# Patient Record
Sex: Female | Born: 1945 | Race: Black or African American | Hispanic: No | Marital: Married | Smoking: Never smoker
Health system: Southern US, Community
[De-identification: ages and names within clinical notes are randomized; demographics above are authoritative.]

## PROBLEM LIST (undated history)

## (undated) DIAGNOSIS — I10 Essential (primary) hypertension: Secondary | ICD-10-CM

## (undated) DIAGNOSIS — E079 Disorder of thyroid, unspecified: Secondary | ICD-10-CM

## (undated) DIAGNOSIS — N189 Chronic kidney disease, unspecified: Secondary | ICD-10-CM

## (undated) DIAGNOSIS — I509 Heart failure, unspecified: Secondary | ICD-10-CM

## (undated) DIAGNOSIS — D869 Sarcoidosis, unspecified: Secondary | ICD-10-CM

## (undated) DIAGNOSIS — J449 Chronic obstructive pulmonary disease, unspecified: Secondary | ICD-10-CM

## (undated) HISTORY — DX: Chronic obstructive pulmonary disease, unspecified: J44.9

## (undated) HISTORY — PX: KNEE SURGERY: SHX244

## (undated) HISTORY — PX: OTHER SURGICAL HISTORY: SHX169

## (undated) HISTORY — PX: CATARACT EXTRACTION: SUR2

## (undated) HISTORY — PX: GALLBLADDER SURGERY: SHX652

## (undated) HISTORY — DX: Chronic kidney disease, unspecified: N18.9

## (undated) HISTORY — DX: Sarcoidosis, unspecified: D86.9

## (undated) HISTORY — DX: Heart failure, unspecified: I50.9

---

## 2003-01-28 ENCOUNTER — Ambulatory Visit (HOSPITAL_COMMUNITY): Admission: RE | Admit: 2003-01-28 | Discharge: 2003-01-28 | Payer: Self-pay

## 2004-02-05 ENCOUNTER — Ambulatory Visit: Payer: Self-pay | Admitting: Unknown Physician Specialty

## 2005-10-18 ENCOUNTER — Ambulatory Visit: Payer: Self-pay | Admitting: Family Medicine

## 2006-03-28 ENCOUNTER — Ambulatory Visit: Payer: Self-pay | Admitting: Unknown Physician Specialty

## 2006-04-27 ENCOUNTER — Ambulatory Visit: Payer: Self-pay | Admitting: Urology

## 2006-05-17 ENCOUNTER — Other Ambulatory Visit: Payer: Self-pay

## 2006-05-17 ENCOUNTER — Ambulatory Visit: Payer: Self-pay | Admitting: Urology

## 2006-05-28 ENCOUNTER — Ambulatory Visit: Payer: Self-pay

## 2007-04-30 ENCOUNTER — Encounter: Payer: Self-pay | Admitting: Orthopedic Surgery

## 2007-05-16 ENCOUNTER — Ambulatory Visit: Payer: Self-pay | Admitting: Unknown Physician Specialty

## 2007-05-19 ENCOUNTER — Encounter: Payer: Self-pay | Admitting: Orthopedic Surgery

## 2007-06-19 ENCOUNTER — Encounter: Payer: Self-pay | Admitting: Orthopedic Surgery

## 2007-07-19 ENCOUNTER — Encounter: Payer: Self-pay | Admitting: Orthopedic Surgery

## 2008-01-13 ENCOUNTER — Ambulatory Visit: Payer: Self-pay | Admitting: Unknown Physician Specialty

## 2008-01-29 ENCOUNTER — Ambulatory Visit: Payer: Self-pay | Admitting: Otolaryngology

## 2008-05-05 ENCOUNTER — Ambulatory Visit: Payer: Self-pay | Admitting: Otolaryngology

## 2008-05-21 ENCOUNTER — Ambulatory Visit: Payer: Self-pay | Admitting: Unknown Physician Specialty

## 2009-01-18 ENCOUNTER — Emergency Department: Payer: Self-pay | Admitting: Emergency Medicine

## 2009-05-25 ENCOUNTER — Ambulatory Visit: Payer: Self-pay | Admitting: Unknown Physician Specialty

## 2009-06-21 ENCOUNTER — Encounter: Payer: Self-pay | Admitting: Otolaryngology

## 2009-07-18 ENCOUNTER — Encounter: Payer: Self-pay | Admitting: Otolaryngology

## 2009-08-18 ENCOUNTER — Encounter: Payer: Self-pay | Admitting: Otolaryngology

## 2009-09-17 ENCOUNTER — Encounter: Payer: Self-pay | Admitting: Otolaryngology

## 2009-10-19 ENCOUNTER — Emergency Department: Payer: Self-pay | Admitting: Emergency Medicine

## 2010-06-28 ENCOUNTER — Ambulatory Visit: Payer: Self-pay | Admitting: Unknown Physician Specialty

## 2010-07-27 ENCOUNTER — Emergency Department: Payer: Self-pay | Admitting: Emergency Medicine

## 2010-09-04 ENCOUNTER — Emergency Department: Payer: Self-pay | Admitting: Emergency Medicine

## 2011-01-23 ENCOUNTER — Ambulatory Visit: Payer: Self-pay

## 2011-01-26 ENCOUNTER — Ambulatory Visit: Payer: Self-pay

## 2011-04-06 ENCOUNTER — Ambulatory Visit: Payer: Self-pay | Admitting: Gastroenterology

## 2011-04-07 LAB — PATHOLOGY REPORT

## 2011-05-26 ENCOUNTER — Ambulatory Visit: Payer: Self-pay

## 2011-05-31 DIAGNOSIS — G8929 Other chronic pain: Secondary | ICD-10-CM | POA: Insufficient documentation

## 2011-05-31 DIAGNOSIS — D869 Sarcoidosis, unspecified: Secondary | ICD-10-CM | POA: Insufficient documentation

## 2011-05-31 DIAGNOSIS — D219 Benign neoplasm of connective and other soft tissue, unspecified: Secondary | ICD-10-CM | POA: Insufficient documentation

## 2011-05-31 DIAGNOSIS — R102 Pelvic and perineal pain: Secondary | ICD-10-CM | POA: Insufficient documentation

## 2011-07-13 ENCOUNTER — Ambulatory Visit: Payer: Self-pay | Admitting: Unknown Physician Specialty

## 2012-06-11 ENCOUNTER — Ambulatory Visit: Payer: Self-pay

## 2012-10-04 ENCOUNTER — Ambulatory Visit: Payer: Self-pay

## 2013-03-24 DIAGNOSIS — J019 Acute sinusitis, unspecified: Secondary | ICD-10-CM | POA: Diagnosis not present

## 2013-04-08 DIAGNOSIS — D259 Leiomyoma of uterus, unspecified: Secondary | ICD-10-CM | POA: Diagnosis not present

## 2013-04-08 DIAGNOSIS — D251 Intramural leiomyoma of uterus: Secondary | ICD-10-CM | POA: Diagnosis not present

## 2013-05-12 DIAGNOSIS — H4011X0 Primary open-angle glaucoma, stage unspecified: Secondary | ICD-10-CM | POA: Diagnosis not present

## 2013-06-02 DIAGNOSIS — M545 Low back pain, unspecified: Secondary | ICD-10-CM | POA: Diagnosis not present

## 2013-06-02 DIAGNOSIS — E038 Other specified hypothyroidism: Secondary | ICD-10-CM | POA: Diagnosis not present

## 2013-06-02 DIAGNOSIS — R011 Cardiac murmur, unspecified: Secondary | ICD-10-CM | POA: Diagnosis not present

## 2013-06-02 DIAGNOSIS — M5430 Sciatica, unspecified side: Secondary | ICD-10-CM | POA: Diagnosis not present

## 2013-06-02 DIAGNOSIS — R5381 Other malaise: Secondary | ICD-10-CM | POA: Diagnosis not present

## 2013-06-02 DIAGNOSIS — R5383 Other fatigue: Secondary | ICD-10-CM | POA: Diagnosis not present

## 2013-06-02 DIAGNOSIS — I10 Essential (primary) hypertension: Secondary | ICD-10-CM | POA: Diagnosis not present

## 2013-06-04 ENCOUNTER — Ambulatory Visit: Payer: Self-pay

## 2013-06-04 DIAGNOSIS — M47812 Spondylosis without myelopathy or radiculopathy, cervical region: Secondary | ICD-10-CM | POA: Diagnosis not present

## 2013-06-04 DIAGNOSIS — M546 Pain in thoracic spine: Secondary | ICD-10-CM | POA: Diagnosis not present

## 2013-06-04 DIAGNOSIS — M47817 Spondylosis without myelopathy or radiculopathy, lumbosacral region: Secondary | ICD-10-CM | POA: Diagnosis not present

## 2013-06-04 DIAGNOSIS — M5030 Other cervical disc degeneration, unspecified cervical region: Secondary | ICD-10-CM | POA: Diagnosis not present

## 2013-06-09 DIAGNOSIS — E039 Hypothyroidism, unspecified: Secondary | ICD-10-CM | POA: Diagnosis not present

## 2013-06-09 DIAGNOSIS — E213 Hyperparathyroidism, unspecified: Secondary | ICD-10-CM | POA: Diagnosis not present

## 2013-06-16 DIAGNOSIS — E213 Hyperparathyroidism, unspecified: Secondary | ICD-10-CM | POA: Diagnosis not present

## 2013-06-16 DIAGNOSIS — M810 Age-related osteoporosis without current pathological fracture: Secondary | ICD-10-CM | POA: Diagnosis not present

## 2013-06-16 DIAGNOSIS — E039 Hypothyroidism, unspecified: Secondary | ICD-10-CM | POA: Diagnosis not present

## 2013-06-24 DIAGNOSIS — E038 Other specified hypothyroidism: Secondary | ICD-10-CM | POA: Diagnosis not present

## 2013-06-24 DIAGNOSIS — M5430 Sciatica, unspecified side: Secondary | ICD-10-CM | POA: Diagnosis not present

## 2013-06-24 DIAGNOSIS — I10 Essential (primary) hypertension: Secondary | ICD-10-CM | POA: Diagnosis not present

## 2013-06-24 DIAGNOSIS — M064 Inflammatory polyarthropathy: Secondary | ICD-10-CM | POA: Diagnosis not present

## 2013-06-24 DIAGNOSIS — M545 Low back pain, unspecified: Secondary | ICD-10-CM | POA: Diagnosis not present

## 2013-06-24 DIAGNOSIS — R5381 Other malaise: Secondary | ICD-10-CM | POA: Diagnosis not present

## 2013-06-24 DIAGNOSIS — R5383 Other fatigue: Secondary | ICD-10-CM | POA: Diagnosis not present

## 2013-07-04 DIAGNOSIS — S058X9A Other injuries of unspecified eye and orbit, initial encounter: Secondary | ICD-10-CM | POA: Diagnosis not present

## 2013-07-07 DIAGNOSIS — H40019 Open angle with borderline findings, low risk, unspecified eye: Secondary | ICD-10-CM | POA: Diagnosis not present

## 2013-07-07 DIAGNOSIS — H16149 Punctate keratitis, unspecified eye: Secondary | ICD-10-CM | POA: Diagnosis not present

## 2013-07-08 DIAGNOSIS — I10 Essential (primary) hypertension: Secondary | ICD-10-CM | POA: Diagnosis not present

## 2013-07-08 DIAGNOSIS — J019 Acute sinusitis, unspecified: Secondary | ICD-10-CM | POA: Diagnosis not present

## 2013-09-16 DIAGNOSIS — S058X9A Other injuries of unspecified eye and orbit, initial encounter: Secondary | ICD-10-CM | POA: Diagnosis not present

## 2013-09-16 DIAGNOSIS — R5383 Other fatigue: Secondary | ICD-10-CM | POA: Diagnosis not present

## 2013-09-16 DIAGNOSIS — E039 Hypothyroidism, unspecified: Secondary | ICD-10-CM | POA: Diagnosis not present

## 2013-09-16 DIAGNOSIS — M064 Inflammatory polyarthropathy: Secondary | ICD-10-CM | POA: Diagnosis not present

## 2013-09-16 DIAGNOSIS — J019 Acute sinusitis, unspecified: Secondary | ICD-10-CM | POA: Diagnosis not present

## 2013-09-16 DIAGNOSIS — R5381 Other malaise: Secondary | ICD-10-CM | POA: Diagnosis not present

## 2013-09-16 DIAGNOSIS — I10 Essential (primary) hypertension: Secondary | ICD-10-CM | POA: Diagnosis not present

## 2013-09-16 DIAGNOSIS — J309 Allergic rhinitis, unspecified: Secondary | ICD-10-CM | POA: Diagnosis not present

## 2013-09-16 DIAGNOSIS — D869 Sarcoidosis, unspecified: Secondary | ICD-10-CM | POA: Diagnosis not present

## 2013-09-30 DIAGNOSIS — J019 Acute sinusitis, unspecified: Secondary | ICD-10-CM | POA: Diagnosis not present

## 2013-09-30 DIAGNOSIS — M064 Inflammatory polyarthropathy: Secondary | ICD-10-CM | POA: Diagnosis not present

## 2013-09-30 DIAGNOSIS — R5383 Other fatigue: Secondary | ICD-10-CM | POA: Diagnosis not present

## 2013-09-30 DIAGNOSIS — R5381 Other malaise: Secondary | ICD-10-CM | POA: Diagnosis not present

## 2013-09-30 DIAGNOSIS — H189 Unspecified disorder of cornea: Secondary | ICD-10-CM | POA: Diagnosis not present

## 2013-09-30 DIAGNOSIS — J309 Allergic rhinitis, unspecified: Secondary | ICD-10-CM | POA: Diagnosis not present

## 2013-09-30 DIAGNOSIS — I10 Essential (primary) hypertension: Secondary | ICD-10-CM | POA: Diagnosis not present

## 2013-10-01 DIAGNOSIS — H4011X0 Primary open-angle glaucoma, stage unspecified: Secondary | ICD-10-CM | POA: Diagnosis not present

## 2013-10-22 DIAGNOSIS — S93609A Unspecified sprain of unspecified foot, initial encounter: Secondary | ICD-10-CM | POA: Diagnosis not present

## 2013-10-22 DIAGNOSIS — M79609 Pain in unspecified limb: Secondary | ICD-10-CM | POA: Diagnosis not present

## 2013-10-22 DIAGNOSIS — M545 Low back pain, unspecified: Secondary | ICD-10-CM | POA: Diagnosis not present

## 2013-10-22 DIAGNOSIS — M7730 Calcaneal spur, unspecified foot: Secondary | ICD-10-CM | POA: Diagnosis not present

## 2013-10-22 DIAGNOSIS — IMO0001 Reserved for inherently not codable concepts without codable children: Secondary | ICD-10-CM | POA: Diagnosis not present

## 2013-10-22 DIAGNOSIS — I10 Essential (primary) hypertension: Secondary | ICD-10-CM | POA: Diagnosis not present

## 2013-10-22 DIAGNOSIS — Z79899 Other long term (current) drug therapy: Secondary | ICD-10-CM | POA: Diagnosis not present

## 2013-10-22 DIAGNOSIS — M19079 Primary osteoarthritis, unspecified ankle and foot: Secondary | ICD-10-CM | POA: Diagnosis not present

## 2013-11-07 DIAGNOSIS — S058X9A Other injuries of unspecified eye and orbit, initial encounter: Secondary | ICD-10-CM | POA: Diagnosis not present

## 2013-11-10 DIAGNOSIS — S058X9A Other injuries of unspecified eye and orbit, initial encounter: Secondary | ICD-10-CM | POA: Diagnosis not present

## 2013-11-13 ENCOUNTER — Emergency Department: Payer: Self-pay | Admitting: Emergency Medicine

## 2013-11-13 DIAGNOSIS — S93609A Unspecified sprain of unspecified foot, initial encounter: Secondary | ICD-10-CM | POA: Diagnosis not present

## 2013-11-13 DIAGNOSIS — I10 Essential (primary) hypertension: Secondary | ICD-10-CM | POA: Diagnosis not present

## 2013-11-13 DIAGNOSIS — M19079 Primary osteoarthritis, unspecified ankle and foot: Secondary | ICD-10-CM | POA: Diagnosis not present

## 2013-11-13 DIAGNOSIS — M7730 Calcaneal spur, unspecified foot: Secondary | ICD-10-CM | POA: Diagnosis not present

## 2013-11-13 LAB — BASIC METABOLIC PANEL
Anion Gap: 7 (ref 7–16)
BUN: 22 mg/dL — ABNORMAL HIGH (ref 7–18)
Calcium, Total: 10.2 mg/dL — ABNORMAL HIGH (ref 8.5–10.1)
Chloride: 109 mmol/L — ABNORMAL HIGH (ref 98–107)
Co2: 26 mmol/L (ref 21–32)
Creatinine: 1.11 mg/dL (ref 0.60–1.30)
EGFR (African American): >60
EGFR (Non-African Amer.): 52 — ABNORMAL LOW
Glucose: 92 mg/dL (ref 65–99)
Osmolality: 286 (ref 275–301)
Potassium: 4 mmol/L (ref 3.5–5.1)
Sodium: 142 mmol/L (ref 136–145)

## 2013-11-13 LAB — CBC WITH DIFFERENTIAL/PLATELET
Basophil #: 0.1 10*3/uL (ref 0.0–0.1)
Basophil %: 0.8 %
Eosinophil #: 0.1 10*3/uL (ref 0.0–0.7)
Eosinophil %: 0.9 %
HCT: 39.9 % (ref 35.0–47.0)
HGB: 12.7 g/dL (ref 12.0–16.0)
Lymphocyte #: 1.7 10*3/uL (ref 1.0–3.6)
Lymphocyte %: 20.7 %
MCH: 27.5 pg (ref 26.0–34.0)
MCHC: 31.8 g/dL — ABNORMAL LOW (ref 32.0–36.0)
MCV: 86 fL (ref 80–100)
Monocyte #: 0.9 x10 3/mm (ref 0.2–0.9)
Monocyte %: 11.8 %
Neutrophil #: 5.3 10*3/uL (ref 1.4–6.5)
Neutrophil %: 65.8 %
Platelet: 169 10*3/uL (ref 150–440)
RBC: 4.62 10*6/uL (ref 3.80–5.20)
RDW: 14.9 % — ABNORMAL HIGH (ref 11.5–14.5)
WBC: 8 10*3/uL (ref 3.6–11.0)

## 2013-11-13 LAB — URIC ACID: Uric Acid: 4.4 mg/dL (ref 2.6–6.0)

## 2013-11-18 DIAGNOSIS — M545 Low back pain, unspecified: Secondary | ICD-10-CM | POA: Diagnosis not present

## 2013-11-18 DIAGNOSIS — IMO0001 Reserved for inherently not codable concepts without codable children: Secondary | ICD-10-CM | POA: Diagnosis not present

## 2013-12-10 DIAGNOSIS — I10 Essential (primary) hypertension: Secondary | ICD-10-CM | POA: Diagnosis not present

## 2013-12-10 DIAGNOSIS — N183 Chronic kidney disease, stage 3 unspecified: Secondary | ICD-10-CM | POA: Diagnosis not present

## 2013-12-12 DIAGNOSIS — E039 Hypothyroidism, unspecified: Secondary | ICD-10-CM | POA: Diagnosis not present

## 2013-12-12 DIAGNOSIS — E559 Vitamin D deficiency, unspecified: Secondary | ICD-10-CM | POA: Diagnosis not present

## 2013-12-19 DIAGNOSIS — I10 Essential (primary) hypertension: Secondary | ICD-10-CM | POA: Diagnosis not present

## 2013-12-19 DIAGNOSIS — E213 Hyperparathyroidism, unspecified: Secondary | ICD-10-CM | POA: Diagnosis not present

## 2013-12-19 DIAGNOSIS — M810 Age-related osteoporosis without current pathological fracture: Secondary | ICD-10-CM | POA: Diagnosis not present

## 2013-12-19 DIAGNOSIS — E784 Other hyperlipidemia: Secondary | ICD-10-CM | POA: Diagnosis not present

## 2013-12-19 DIAGNOSIS — I517 Cardiomegaly: Secondary | ICD-10-CM | POA: Diagnosis not present

## 2013-12-19 DIAGNOSIS — R001 Bradycardia, unspecified: Secondary | ICD-10-CM | POA: Diagnosis not present

## 2013-12-19 DIAGNOSIS — E039 Hypothyroidism, unspecified: Secondary | ICD-10-CM | POA: Diagnosis not present

## 2014-01-06 DIAGNOSIS — H40023 Open angle with borderline findings, high risk, bilateral: Secondary | ICD-10-CM | POA: Diagnosis not present

## 2014-01-08 DIAGNOSIS — R001 Bradycardia, unspecified: Secondary | ICD-10-CM | POA: Diagnosis not present

## 2014-01-08 DIAGNOSIS — E210 Primary hyperparathyroidism: Secondary | ICD-10-CM | POA: Diagnosis not present

## 2014-01-08 DIAGNOSIS — I517 Cardiomegaly: Secondary | ICD-10-CM | POA: Diagnosis not present

## 2014-01-08 DIAGNOSIS — E039 Hypothyroidism, unspecified: Secondary | ICD-10-CM | POA: Diagnosis not present

## 2014-01-08 DIAGNOSIS — M810 Age-related osteoporosis without current pathological fracture: Secondary | ICD-10-CM | POA: Diagnosis not present

## 2014-01-08 DIAGNOSIS — I10 Essential (primary) hypertension: Secondary | ICD-10-CM | POA: Diagnosis not present

## 2014-01-08 DIAGNOSIS — E782 Mixed hyperlipidemia: Secondary | ICD-10-CM | POA: Insufficient documentation

## 2014-01-29 DIAGNOSIS — J300 Vasomotor rhinitis: Secondary | ICD-10-CM | POA: Diagnosis not present

## 2014-01-29 DIAGNOSIS — I10 Essential (primary) hypertension: Secondary | ICD-10-CM | POA: Diagnosis not present

## 2014-01-29 DIAGNOSIS — E039 Hypothyroidism, unspecified: Secondary | ICD-10-CM | POA: Diagnosis not present

## 2014-01-29 DIAGNOSIS — D869 Sarcoidosis, unspecified: Secondary | ICD-10-CM | POA: Diagnosis not present

## 2014-01-29 DIAGNOSIS — E782 Mixed hyperlipidemia: Secondary | ICD-10-CM | POA: Diagnosis not present

## 2014-02-02 DIAGNOSIS — M810 Age-related osteoporosis without current pathological fracture: Secondary | ICD-10-CM | POA: Diagnosis not present

## 2014-03-02 DIAGNOSIS — H9202 Otalgia, left ear: Secondary | ICD-10-CM | POA: Diagnosis not present

## 2014-03-02 DIAGNOSIS — H66009 Acute suppurative otitis media without spontaneous rupture of ear drum, unspecified ear: Secondary | ICD-10-CM | POA: Diagnosis not present

## 2014-03-02 DIAGNOSIS — I10 Essential (primary) hypertension: Secondary | ICD-10-CM | POA: Diagnosis not present

## 2014-03-11 DIAGNOSIS — I10 Essential (primary) hypertension: Secondary | ICD-10-CM | POA: Diagnosis not present

## 2014-03-11 DIAGNOSIS — E782 Mixed hyperlipidemia: Secondary | ICD-10-CM | POA: Diagnosis not present

## 2014-03-11 DIAGNOSIS — I517 Cardiomegaly: Secondary | ICD-10-CM | POA: Diagnosis not present

## 2014-03-25 DIAGNOSIS — H40013 Open angle with borderline findings, low risk, bilateral: Secondary | ICD-10-CM | POA: Diagnosis not present

## 2014-03-27 DIAGNOSIS — J301 Allergic rhinitis due to pollen: Secondary | ICD-10-CM | POA: Diagnosis not present

## 2014-03-27 DIAGNOSIS — H6983 Other specified disorders of Eustachian tube, bilateral: Secondary | ICD-10-CM | POA: Diagnosis not present

## 2014-03-27 DIAGNOSIS — H6121 Impacted cerumen, right ear: Secondary | ICD-10-CM | POA: Diagnosis not present

## 2014-04-24 DIAGNOSIS — H18832 Recurrent erosion of cornea, left eye: Secondary | ICD-10-CM | POA: Diagnosis not present

## 2014-04-28 DIAGNOSIS — S0502XA Injury of conjunctiva and corneal abrasion without foreign body, left eye, initial encounter: Secondary | ICD-10-CM | POA: Diagnosis not present

## 2014-06-08 DIAGNOSIS — T753XXA Motion sickness, initial encounter: Secondary | ICD-10-CM | POA: Diagnosis not present

## 2014-06-08 DIAGNOSIS — D869 Sarcoidosis, unspecified: Secondary | ICD-10-CM | POA: Diagnosis not present

## 2014-06-08 DIAGNOSIS — I10 Essential (primary) hypertension: Secondary | ICD-10-CM | POA: Diagnosis not present

## 2014-06-08 DIAGNOSIS — E039 Hypothyroidism, unspecified: Secondary | ICD-10-CM | POA: Diagnosis not present

## 2014-06-08 DIAGNOSIS — M545 Low back pain: Secondary | ICD-10-CM | POA: Diagnosis not present

## 2014-06-08 DIAGNOSIS — J309 Allergic rhinitis, unspecified: Secondary | ICD-10-CM | POA: Diagnosis not present

## 2014-07-01 DIAGNOSIS — D869 Sarcoidosis, unspecified: Secondary | ICD-10-CM | POA: Diagnosis not present

## 2014-07-01 DIAGNOSIS — I10 Essential (primary) hypertension: Secondary | ICD-10-CM | POA: Diagnosis not present

## 2014-07-01 DIAGNOSIS — M545 Low back pain: Secondary | ICD-10-CM | POA: Diagnosis not present

## 2014-07-01 DIAGNOSIS — N390 Urinary tract infection, site not specified: Secondary | ICD-10-CM | POA: Diagnosis not present

## 2014-07-01 DIAGNOSIS — E039 Hypothyroidism, unspecified: Secondary | ICD-10-CM | POA: Diagnosis not present

## 2014-07-06 DIAGNOSIS — I10 Essential (primary) hypertension: Secondary | ICD-10-CM | POA: Insufficient documentation

## 2014-07-08 DIAGNOSIS — H40013 Open angle with borderline findings, low risk, bilateral: Secondary | ICD-10-CM | POA: Diagnosis not present

## 2014-07-09 DIAGNOSIS — R9431 Abnormal electrocardiogram [ECG] [EKG]: Secondary | ICD-10-CM | POA: Insufficient documentation

## 2014-07-09 DIAGNOSIS — I119 Hypertensive heart disease without heart failure: Secondary | ICD-10-CM | POA: Diagnosis not present

## 2014-07-09 DIAGNOSIS — R079 Chest pain, unspecified: Secondary | ICD-10-CM | POA: Diagnosis not present

## 2014-07-09 DIAGNOSIS — I340 Nonrheumatic mitral (valve) insufficiency: Secondary | ICD-10-CM | POA: Insufficient documentation

## 2014-07-09 DIAGNOSIS — I10 Essential (primary) hypertension: Secondary | ICD-10-CM | POA: Diagnosis not present

## 2014-07-24 DIAGNOSIS — H2513 Age-related nuclear cataract, bilateral: Secondary | ICD-10-CM | POA: Diagnosis not present

## 2014-08-04 DIAGNOSIS — E785 Hyperlipidemia, unspecified: Secondary | ICD-10-CM | POA: Diagnosis not present

## 2014-08-04 DIAGNOSIS — E213 Hyperparathyroidism, unspecified: Secondary | ICD-10-CM | POA: Diagnosis not present

## 2014-08-04 DIAGNOSIS — M810 Age-related osteoporosis without current pathological fracture: Secondary | ICD-10-CM | POA: Diagnosis not present

## 2014-08-04 DIAGNOSIS — E039 Hypothyroidism, unspecified: Secondary | ICD-10-CM | POA: Diagnosis not present

## 2014-08-12 DIAGNOSIS — E039 Hypothyroidism, unspecified: Secondary | ICD-10-CM | POA: Diagnosis not present

## 2014-08-12 DIAGNOSIS — M810 Age-related osteoporosis without current pathological fracture: Secondary | ICD-10-CM | POA: Diagnosis not present

## 2014-08-12 DIAGNOSIS — E210 Primary hyperparathyroidism: Secondary | ICD-10-CM | POA: Diagnosis not present

## 2014-08-13 DIAGNOSIS — E782 Mixed hyperlipidemia: Secondary | ICD-10-CM | POA: Diagnosis not present

## 2014-08-13 DIAGNOSIS — R9431 Abnormal electrocardiogram [ECG] [EKG]: Secondary | ICD-10-CM | POA: Diagnosis not present

## 2014-08-13 DIAGNOSIS — I10 Essential (primary) hypertension: Secondary | ICD-10-CM | POA: Diagnosis not present

## 2014-08-13 DIAGNOSIS — I119 Hypertensive heart disease without heart failure: Secondary | ICD-10-CM | POA: Diagnosis not present

## 2014-08-13 DIAGNOSIS — R001 Bradycardia, unspecified: Secondary | ICD-10-CM | POA: Diagnosis not present

## 2014-08-13 DIAGNOSIS — I340 Nonrheumatic mitral (valve) insufficiency: Secondary | ICD-10-CM | POA: Diagnosis not present

## 2014-08-19 DIAGNOSIS — H16109 Unspecified superficial keratitis, unspecified eye: Secondary | ICD-10-CM | POA: Diagnosis not present

## 2014-08-21 DIAGNOSIS — M810 Age-related osteoporosis without current pathological fracture: Secondary | ICD-10-CM | POA: Diagnosis not present

## 2014-09-24 ENCOUNTER — Emergency Department
Admission: EM | Admit: 2014-09-24 | Discharge: 2014-09-24 | Disposition: A | Discharge status: Home / Self Care | Payer: Medicare Other | Attending: Student | Admitting: Student

## 2014-09-24 ENCOUNTER — Encounter: Payer: Self-pay | Admitting: Emergency Medicine

## 2014-09-24 ENCOUNTER — Emergency Department: Payer: Medicare Other

## 2014-09-24 DIAGNOSIS — I10 Essential (primary) hypertension: Secondary | ICD-10-CM | POA: Diagnosis not present

## 2014-09-24 DIAGNOSIS — S9031XA Contusion of right foot, initial encounter: Secondary | ICD-10-CM

## 2014-09-24 DIAGNOSIS — W208XXA Other cause of strike by thrown, projected or falling object, initial encounter: Secondary | ICD-10-CM | POA: Insufficient documentation

## 2014-09-24 DIAGNOSIS — Y9289 Other specified places as the place of occurrence of the external cause: Secondary | ICD-10-CM | POA: Diagnosis not present

## 2014-09-24 DIAGNOSIS — S99921A Unspecified injury of right foot, initial encounter: Secondary | ICD-10-CM | POA: Diagnosis not present

## 2014-09-24 DIAGNOSIS — Y998 Other external cause status: Secondary | ICD-10-CM | POA: Diagnosis not present

## 2014-09-24 DIAGNOSIS — Y9389 Activity, other specified: Secondary | ICD-10-CM | POA: Diagnosis not present

## 2014-09-24 HISTORY — DX: Disorder of thyroid, unspecified: E07.9

## 2014-09-24 HISTORY — DX: Essential (primary) hypertension: I10

## 2014-09-24 MED ORDER — NAPROXEN 500 MG PO TABS: 500.0000 mg | ORAL_TABLET | Freq: Two times a day (BID) | ORAL | Status: DC

## 2014-09-24 NOTE — ED Notes (Signed)
States a glass window from mstorm door fell on top of right foot

## 2014-09-24 NOTE — ED Notes (Signed)
D/c instructions reviewed w/ pt - pt denies any further questions or concerns at present.

## 2014-09-24 NOTE — ED Provider Notes (Signed)
[facility] Emergency Department Provider Note  ____________________________________________  Time seen: Approximately 3:22 PM  I have reviewed the triage vital signs and the nursing notes.   HISTORY  Chief Complaint Foot Pain    HPI Stacey [name] is a 69 y.o. female plana right foot pain secondary to blunt trauma. Patient state glass window from storm door fell on top of foot. Onset approximately an hour and a half ago. It hurts to move her toes. Patient stated this swelling on top part of her foot. Patient rates the pain as a 9/10. States increased pain with ambulation. Patient described a pain as sharp. No palliative measures taken since incident.   Past Medical History  Diagnosis Date  . Hypertension   . Thyroid disease     There are no active problems to display for this patient.   History reviewed. No pertinent past surgical history.  No current outpatient prescriptions on file.  Allergies Tramadol and Vicodin  History reviewed. No pertinent family history.  Social History History  Substance Use Topics  . Smoking status: Never Smoker   . Smokeless tobacco: Not on file  . Alcohol Use: No    Review of Systems Constitutional: No fever/chills Eyes: No visual changes. ENT: No sore throat. Cardiovascular: Denies chest pain. Respiratory: Denies shortness of breath. Gastrointestinal: No abdominal pain.  No nausea, no vomiting.  No diarrhea.  No constipation. Genitourinary: Negative for dysuria. Musculoskeletal: Right foot pain. Skin: Negative for rash. Neurological: Negative for headaches, focal weakness or numbness. Endocrine:Hypertension hypothyroidism. Allergic/Immunilogical: See medication this 10-point ROS otherwise negative.  ____________________________________________   PHYSICAL EXAM:  VITAL SIGNS: ED Triage Vitals  Enc Vitals Group     BP 09/24/14 1503 136/66 mmHg     Pulse Rate 09/24/14 1503 67     Resp 09/24/14  1503 18     Temp 09/24/14 1503 98.4 F (36.9 C)     Temp Source 09/24/14 1503 Oral     SpO2 09/24/14 1503 99 %     Weight 09/24/14 1503 137 lb (62.143 kg)     Height 09/24/14 1503 4\' 9"  (1.448 m)     Head Cir --      Peak Flow --      Pain Score 09/24/14 1505 9     Pain Loc --      Pain Edu? --      Excl. in [place]? --    Constitutional: Alert and oriented. Well appearing and in no acute distress. Eyes: Conjunctivae are normal. PERRL. EOMI. Head: Atraumatic. Nose: No congestion/rhinnorhea. Mouth/Throat: Mucous membranes are moist.  Oropharynx non-erythematous. Neck: No stridor. No cervical spine tenderness to palpation. Hematological/Lymphatic/Immunilogical: No cervical lymphadenopathy. Cardiovascular: Normal rate, regular rhythm. Grossly normal heart sounds.  Good peripheral circulation. Respiratory: Normal respiratory effort.  No retractions. Lungs CTAB. Gastrointestinal: Soft and nontender. No distention. No abdominal bruits. No CVA tenderness. Musculoskeletal: No deformity to the right foot. There is mild edema and abrasions to the dorsal aspect of the foot. There is neurovascular intact decreased range of motion with flexion extension of the phalange secondary to complain of pain. Neurologic:  Normal speech and language. No gross focal neurologic deficits are appreciated. Speech is normal. No gait instability. Skin:  Skin is warm, dry and intact. No rash noted. Small abrasion dorsal lateral aspect right foot. Psychiatric: Mood and affect are normal. Speech and behavior are normal.  ____________________________________________   LABS (all labs ordered are listed, but only abnormal results are displayed)  Labs  Reviewed - No data to display ____________________________________________  EKG   ____________________________________________  RADIOLOGY no acute finding. Incidental finding of an upper small heel spur. I, Sable Feil, personally viewed and evaluated these images as  part of my medical decision making.   ____________________________________________   PROCEDURES  Procedure(s) performed: None  Critical Care performed: No  ____________________________________________   INITIAL IMPRESSION / ASSESSMENT AND PLAN / ED COURSE  Pertinent labs & imaging results that were available during my care of the patient were reviewed by me and considered in my medical decision making (see chart for details).  Right foot contusion. Small heel spur. Patient advised on supportive care. Patient for naproxen was given. Patient advised follow-up family doctor. Patient by the time the ER if condition worsens. ____________________________________________   FINAL CLINICAL IMPRESSION(S) / ED DIAGNOSES  Final diagnoses:  Foot contusion, right, initial encounter      Sable Feil, PA-C 09/24/14 Quail Gayle, MD 09/24/14 [phone]

## 2014-09-29 DIAGNOSIS — H40019 Open angle with borderline findings, low risk, unspecified eye: Secondary | ICD-10-CM | POA: Diagnosis not present

## 2014-09-29 DIAGNOSIS — H2513 Age-related nuclear cataract, bilateral: Secondary | ICD-10-CM | POA: Diagnosis not present

## 2014-10-06 DIAGNOSIS — E782 Mixed hyperlipidemia: Secondary | ICD-10-CM | POA: Diagnosis not present

## 2014-10-06 DIAGNOSIS — J309 Allergic rhinitis, unspecified: Secondary | ICD-10-CM | POA: Diagnosis not present

## 2014-10-06 DIAGNOSIS — Z0001 Encounter for general adult medical examination with abnormal findings: Secondary | ICD-10-CM | POA: Diagnosis not present

## 2014-10-06 DIAGNOSIS — I10 Essential (primary) hypertension: Secondary | ICD-10-CM | POA: Diagnosis not present

## 2014-10-06 DIAGNOSIS — M545 Low back pain: Secondary | ICD-10-CM | POA: Diagnosis not present

## 2014-10-06 DIAGNOSIS — E039 Hypothyroidism, unspecified: Secondary | ICD-10-CM | POA: Diagnosis not present

## 2014-10-06 DIAGNOSIS — R87810 Cervical high risk human papillomavirus (HPV) DNA test positive: Secondary | ICD-10-CM | POA: Diagnosis not present

## 2014-10-06 DIAGNOSIS — D869 Sarcoidosis, unspecified: Secondary | ICD-10-CM | POA: Diagnosis not present

## 2014-10-06 DIAGNOSIS — Z124 Encounter for screening for malignant neoplasm of cervix: Secondary | ICD-10-CM | POA: Diagnosis not present

## 2014-10-08 DIAGNOSIS — D259 Leiomyoma of uterus, unspecified: Secondary | ICD-10-CM | POA: Diagnosis not present

## 2014-10-14 ENCOUNTER — Encounter (INDEPENDENT_AMBULATORY_CARE_PROVIDER_SITE_OTHER): Payer: Medicare Other | Admitting: Ophthalmology

## 2014-10-14 DIAGNOSIS — H33311 Horseshoe tear of retina without detachment, right eye: Secondary | ICD-10-CM | POA: Diagnosis not present

## 2014-10-14 DIAGNOSIS — H33013 Retinal detachment with single break, bilateral: Secondary | ICD-10-CM

## 2014-10-14 DIAGNOSIS — H18603 Keratoconus, unspecified, bilateral: Secondary | ICD-10-CM | POA: Diagnosis not present

## 2014-10-14 DIAGNOSIS — I10 Essential (primary) hypertension: Secondary | ICD-10-CM | POA: Diagnosis not present

## 2014-10-14 DIAGNOSIS — H35033 Hypertensive retinopathy, bilateral: Secondary | ICD-10-CM

## 2014-10-14 DIAGNOSIS — H43813 Vitreous degeneration, bilateral: Secondary | ICD-10-CM | POA: Diagnosis not present

## 2014-10-14 DIAGNOSIS — H25011 Cortical age-related cataract, right eye: Secondary | ICD-10-CM | POA: Diagnosis not present

## 2014-10-14 DIAGNOSIS — H40013 Open angle with borderline findings, low risk, bilateral: Secondary | ICD-10-CM | POA: Diagnosis not present

## 2014-10-16 ENCOUNTER — Encounter (INDEPENDENT_AMBULATORY_CARE_PROVIDER_SITE_OTHER): Payer: Medicare Other | Admitting: Ophthalmology

## 2014-10-16 DIAGNOSIS — H33013 Retinal detachment with single break, bilateral: Secondary | ICD-10-CM

## 2014-10-21 DIAGNOSIS — Z1231 Encounter for screening mammogram for malignant neoplasm of breast: Secondary | ICD-10-CM | POA: Diagnosis not present

## 2014-10-30 DIAGNOSIS — R922 Inconclusive mammogram: Secondary | ICD-10-CM | POA: Diagnosis not present

## 2014-10-30 DIAGNOSIS — Z87898 Personal history of other specified conditions: Secondary | ICD-10-CM | POA: Diagnosis not present

## 2014-10-30 DIAGNOSIS — R590 Localized enlarged lymph nodes: Secondary | ICD-10-CM | POA: Diagnosis not present

## 2014-10-30 DIAGNOSIS — R928 Other abnormal and inconclusive findings on diagnostic imaging of breast: Secondary | ICD-10-CM | POA: Diagnosis not present

## 2014-11-11 ENCOUNTER — Ambulatory Visit (INDEPENDENT_AMBULATORY_CARE_PROVIDER_SITE_OTHER): Payer: Medicare Other | Admitting: Ophthalmology

## 2014-11-11 DIAGNOSIS — H33303 Unspecified retinal break, bilateral: Secondary | ICD-10-CM

## 2014-11-19 DIAGNOSIS — H18833 Recurrent erosion of cornea, bilateral: Secondary | ICD-10-CM | POA: Diagnosis not present

## 2014-11-19 DIAGNOSIS — H1852 Epithelial (juvenile) corneal dystrophy: Secondary | ICD-10-CM | POA: Diagnosis not present

## 2014-11-19 DIAGNOSIS — H18613 Keratoconus, stable, bilateral: Secondary | ICD-10-CM | POA: Diagnosis not present

## 2014-11-19 DIAGNOSIS — H52213 Irregular astigmatism, bilateral: Secondary | ICD-10-CM | POA: Diagnosis not present

## 2014-11-26 DIAGNOSIS — N182 Chronic kidney disease, stage 2 (mild): Secondary | ICD-10-CM | POA: Diagnosis not present

## 2014-11-26 DIAGNOSIS — I10 Essential (primary) hypertension: Secondary | ICD-10-CM | POA: Diagnosis not present

## 2014-11-26 DIAGNOSIS — D259 Leiomyoma of uterus, unspecified: Secondary | ICD-10-CM | POA: Diagnosis not present

## 2014-11-26 DIAGNOSIS — E039 Hypothyroidism, unspecified: Secondary | ICD-10-CM | POA: Diagnosis not present

## 2014-12-14 ENCOUNTER — Emergency Department
Admission: EM | Admit: 2014-12-14 | Discharge: 2014-12-14 | Disposition: A | Discharge status: Home / Self Care | Payer: Medicare Other | Attending: Student | Admitting: Student

## 2014-12-14 ENCOUNTER — Encounter: Payer: Self-pay | Admitting: Emergency Medicine

## 2014-12-14 DIAGNOSIS — R21 Rash and other nonspecific skin eruption: Secondary | ICD-10-CM | POA: Diagnosis present

## 2014-12-14 DIAGNOSIS — Y9289 Other specified places as the place of occurrence of the external cause: Secondary | ICD-10-CM | POA: Diagnosis not present

## 2014-12-14 DIAGNOSIS — T7840XA Allergy, unspecified, initial encounter: Secondary | ICD-10-CM

## 2014-12-14 DIAGNOSIS — Y998 Other external cause status: Secondary | ICD-10-CM | POA: Insufficient documentation

## 2014-12-14 DIAGNOSIS — I10 Essential (primary) hypertension: Secondary | ICD-10-CM | POA: Insufficient documentation

## 2014-12-14 DIAGNOSIS — X58XXXA Exposure to other specified factors, initial encounter: Secondary | ICD-10-CM | POA: Diagnosis not present

## 2014-12-14 DIAGNOSIS — Y9389 Activity, other specified: Secondary | ICD-10-CM | POA: Diagnosis not present

## 2014-12-14 MED ORDER — FAMOTIDINE 20 MG PO TABS: 20.0000 mg | ORAL_TABLET | Freq: Every day | ORAL | Status: DC

## 2014-12-14 MED ORDER — DEXAMETHASONE SODIUM PHOSPHATE 10 MG/ML IJ SOLN
10.0000 mg | Freq: Once | INTRAMUSCULAR | Status: AC
  Administered 2014-12-14: 10 mg via INTRAMUSCULAR
  Filled 2014-12-14: qty 1

## 2014-12-14 MED ORDER — FAMOTIDINE 20 MG PO TABS
20.0000 mg | ORAL_TABLET | Freq: Once | ORAL | Status: AC
  Administered 2014-12-14: 20 mg via ORAL
  Filled 2014-12-14: qty 1

## 2014-12-14 MED ORDER — HYDROXYZINE HCL 25 MG PO TABS: 25.0000 mg | ORAL_TABLET | Freq: Three times a day (TID) | ORAL | Status: DC | PRN

## 2014-12-14 MED ORDER — HYDROXYZINE HCL 25 MG PO TABS
25.0000 mg | ORAL_TABLET | Freq: Once | ORAL | Status: AC
  Administered 2014-12-14: 25 mg via ORAL
  Filled 2014-12-14: qty 1

## 2014-12-14 MED ORDER — PREDNISONE 10 MG (21) PO TBPK: ORAL_TABLET | ORAL | Status: DC

## 2014-12-14 NOTE — Discharge Instructions (Signed)

## 2014-12-14 NOTE — ED Provider Notes (Signed)
[facility] Emergency Department Provider Note ____________________________________________  Time seen: Approximately 5:09 PM  I have reviewed the triage vital signs and the nursing notes.   HISTORY  Chief Complaint Rash   HPI Stacey [name] is a 69 y.o. female who presents to the emergency department for evaluation of a rash. Rash started yesterday and progressed through the night. Rash is now on her face, neck, chest, and bilateral upper extremities. It is pruritic in nature. She is unaware of any known exposures or bites other than a mosquito bite to her left upper arm. She has never had any reaction like this in the past. She has not taken any medications for itching. She denies shortness of breath, throat irritation, or difficulty swallowing.   Past Medical History  Diagnosis Date  . Hypertension   . Thyroid disease     There are no active problems to display for this patient.   History reviewed. No pertinent past surgical history.  Current Outpatient Rx  Name  Route  Sig  Dispense  Refill  . naproxen (NAPROSYN) 500 MG tablet   Oral   Take 1 tablet (500 mg total) by mouth 2 (two) times daily with a meal.   20 tablet   0     Allergies Tramadol and Vicodin  No family history on file.  Social History Social History  Substance Use Topics  . Smoking status: Never Smoker   . Smokeless tobacco: None  . Alcohol Use: No    Review of Systems   Constitutional: No fever/chills Eyes: No visual changes. ENT: No congestion or rhinorrhea Cardiovascular: Denies chest pain. Respiratory: Denies shortness of breath. Gastrointestinal: No abdominal pain.  No nausea, no vomiting.  No diarrhea.  No constipation. Genitourinary: Negative for dysuria. Musculoskeletal: Negative for back pain. Skin: Positive for rash Neurological: Negative for headaches, focal weakness or numbness.  10-point ROS otherwise  negative.  ____________________________________________   PHYSICAL EXAM:  VITAL SIGNS: ED Triage Vitals  Enc Vitals Group     BP 12/14/14 1646 156/78 mmHg     Pulse Rate 12/14/14 1646 88     Resp 12/14/14 1646 20     Temp 12/14/14 1646 98 F (36.7 C)     Temp src --      SpO2 12/14/14 1646 98 %     Weight 12/14/14 1646 140 lb (63.504 kg)     Height 12/14/14 1646 4\' 9"  (1.448 m)     Head Cir --      Peak Flow --      Pain Score --      Pain Loc --      Pain Edu? --      Excl. in [place]? --     Constitutional: Alert and oriented. Well appearing and in no acute distress. Eyes: Conjunctivae are normal. PERRL. EOMI. Head: Atraumatic. Nose: No congestion/rhinnorhea. Mouth/Throat: Mucous membranes are moist.  Oropharynx non-erythematous. No oral lesions. Neck: No stridor. Cardiovascular: Normal rate, regular rhythm.  Good peripheral circulation. Respiratory: Normal respiratory effort.  No retractions. Lungs CTAB. Gastrointestinal: Soft and nontender. No distention. No abdominal bruits.  Musculoskeletal: No lower extremity tenderness nor edema.  No joint effusions. Neurologic:  Normal speech and language. No gross focal neurologic deficits are appreciated. Speech is normal. No gait instability. Skin:  Maculopapular erythematous rash scattered over the face, neck, chest, and bilateral upper extremities.; Negative for petechiae.  Psychiatric: Mood and affect are normal. Speech and behavior are normal.  ____________________________________________   LABS (  all labs ordered are listed, but only abnormal results are displayed)  Labs Reviewed - No data to display ____________________________________________  EKG   ____________________________________________  RADIOLOGY   ____________________________________________   PROCEDURES  Procedure(s) performed: None ____________________________________________   INITIAL IMPRESSION / ASSESSMENT AND PLAN / ED COURSE  Pertinent  labs & imaging results that were available during my care of the patient were reviewed by me and considered in my medical decision making (see chart for details).  Patient was given IM Decadron, oral hydroxyzine and oral Pepcid in the emergency department.  Patient was advised to follow up with the primary care provider for symptoms that are not improving over the next 24 hours. She was advised to return to the emergency department for symptoms that change or worsen if unable to schedule an appointment with the primary care provider. ____________________________________________   FINAL CLINICAL IMPRESSION(S) / ED DIAGNOSES  Final diagnoses:  None       Victorino Dike, FNP 12/14/14 1927  Joanne Gavel, MD 12/14/14 [phone]

## 2014-12-14 NOTE — ED Notes (Signed)
Developed a rash with positive itching since last pm min swelling to lips  No resp distress noted

## 2014-12-16 DIAGNOSIS — R9431 Abnormal electrocardiogram [ECG] [EKG]: Secondary | ICD-10-CM | POA: Diagnosis not present

## 2014-12-16 DIAGNOSIS — R001 Bradycardia, unspecified: Secondary | ICD-10-CM | POA: Diagnosis not present

## 2014-12-16 DIAGNOSIS — I340 Nonrheumatic mitral (valve) insufficiency: Secondary | ICD-10-CM | POA: Diagnosis not present

## 2014-12-16 DIAGNOSIS — E782 Mixed hyperlipidemia: Secondary | ICD-10-CM | POA: Diagnosis not present

## 2014-12-16 DIAGNOSIS — I10 Essential (primary) hypertension: Secondary | ICD-10-CM | POA: Diagnosis not present

## 2014-12-16 DIAGNOSIS — I119 Hypertensive heart disease without heart failure: Secondary | ICD-10-CM | POA: Diagnosis not present

## 2014-12-23 DIAGNOSIS — H1852 Epithelial (juvenile) corneal dystrophy: Secondary | ICD-10-CM | POA: Diagnosis not present

## 2014-12-23 DIAGNOSIS — H18833 Recurrent erosion of cornea, bilateral: Secondary | ICD-10-CM | POA: Diagnosis not present

## 2014-12-30 DIAGNOSIS — L239 Allergic contact dermatitis, unspecified cause: Secondary | ICD-10-CM | POA: Diagnosis not present

## 2014-12-30 DIAGNOSIS — L302 Cutaneous autosensitization: Secondary | ICD-10-CM | POA: Diagnosis not present

## 2015-01-06 DIAGNOSIS — N390 Urinary tract infection, site not specified: Secondary | ICD-10-CM | POA: Diagnosis not present

## 2015-01-06 DIAGNOSIS — R399 Unspecified symptoms and signs involving the genitourinary system: Secondary | ICD-10-CM | POA: Diagnosis not present

## 2015-01-06 DIAGNOSIS — R531 Weakness: Secondary | ICD-10-CM | POA: Diagnosis not present

## 2015-01-27 DIAGNOSIS — R001 Bradycardia, unspecified: Secondary | ICD-10-CM | POA: Diagnosis not present

## 2015-01-27 DIAGNOSIS — I10 Essential (primary) hypertension: Secondary | ICD-10-CM | POA: Diagnosis not present

## 2015-01-27 DIAGNOSIS — I119 Hypertensive heart disease without heart failure: Secondary | ICD-10-CM | POA: Diagnosis not present

## 2015-01-27 DIAGNOSIS — R9431 Abnormal electrocardiogram [ECG] [EKG]: Secondary | ICD-10-CM | POA: Diagnosis not present

## 2015-01-27 DIAGNOSIS — E782 Mixed hyperlipidemia: Secondary | ICD-10-CM | POA: Diagnosis not present

## 2015-01-27 DIAGNOSIS — I340 Nonrheumatic mitral (valve) insufficiency: Secondary | ICD-10-CM | POA: Diagnosis not present

## 2015-02-04 DIAGNOSIS — L309 Dermatitis, unspecified: Secondary | ICD-10-CM | POA: Diagnosis not present

## 2015-02-04 DIAGNOSIS — L259 Unspecified contact dermatitis, unspecified cause: Secondary | ICD-10-CM | POA: Diagnosis not present

## 2015-02-08 DIAGNOSIS — E782 Mixed hyperlipidemia: Secondary | ICD-10-CM | POA: Diagnosis not present

## 2015-02-08 DIAGNOSIS — E559 Vitamin D deficiency, unspecified: Secondary | ICD-10-CM | POA: Diagnosis not present

## 2015-02-08 DIAGNOSIS — M810 Age-related osteoporosis without current pathological fracture: Secondary | ICD-10-CM | POA: Diagnosis not present

## 2015-02-08 DIAGNOSIS — E210 Primary hyperparathyroidism: Secondary | ICD-10-CM | POA: Diagnosis not present

## 2015-02-08 DIAGNOSIS — E039 Hypothyroidism, unspecified: Secondary | ICD-10-CM | POA: Diagnosis not present

## 2015-02-15 DIAGNOSIS — M810 Age-related osteoporosis without current pathological fracture: Secondary | ICD-10-CM | POA: Diagnosis not present

## 2015-02-15 DIAGNOSIS — D869 Sarcoidosis, unspecified: Secondary | ICD-10-CM | POA: Diagnosis not present

## 2015-02-15 DIAGNOSIS — E039 Hypothyroidism, unspecified: Secondary | ICD-10-CM | POA: Diagnosis not present

## 2015-02-15 DIAGNOSIS — E210 Primary hyperparathyroidism: Secondary | ICD-10-CM | POA: Diagnosis not present

## 2015-02-15 DIAGNOSIS — E559 Vitamin D deficiency, unspecified: Secondary | ICD-10-CM | POA: Diagnosis not present

## 2015-02-19 DIAGNOSIS — L309 Dermatitis, unspecified: Secondary | ICD-10-CM | POA: Diagnosis not present

## 2015-02-25 DIAGNOSIS — D869 Sarcoidosis, unspecified: Secondary | ICD-10-CM | POA: Diagnosis not present

## 2015-02-25 DIAGNOSIS — E039 Hypothyroidism, unspecified: Secondary | ICD-10-CM | POA: Diagnosis not present

## 2015-02-25 DIAGNOSIS — M150 Primary generalized (osteo)arthritis: Secondary | ICD-10-CM | POA: Diagnosis not present

## 2015-02-25 DIAGNOSIS — J309 Allergic rhinitis, unspecified: Secondary | ICD-10-CM | POA: Diagnosis not present

## 2015-02-25 DIAGNOSIS — N182 Chronic kidney disease, stage 2 (mild): Secondary | ICD-10-CM | POA: Diagnosis not present

## 2015-02-25 DIAGNOSIS — I10 Essential (primary) hypertension: Secondary | ICD-10-CM | POA: Diagnosis not present

## 2015-03-08 DIAGNOSIS — M810 Age-related osteoporosis without current pathological fracture: Secondary | ICD-10-CM | POA: Diagnosis not present

## 2015-03-29 ENCOUNTER — Ambulatory Visit (INDEPENDENT_AMBULATORY_CARE_PROVIDER_SITE_OTHER): Payer: Medicare Other | Admitting: Ophthalmology

## 2015-04-16 ENCOUNTER — Ambulatory Visit (INDEPENDENT_AMBULATORY_CARE_PROVIDER_SITE_OTHER): Payer: Medicare Other | Admitting: Ophthalmology

## 2015-04-20 DIAGNOSIS — M545 Low back pain: Secondary | ICD-10-CM | POA: Diagnosis not present

## 2015-04-20 DIAGNOSIS — H6240 Otitis externa in other diseases classified elsewhere, unspecified ear: Secondary | ICD-10-CM | POA: Diagnosis not present

## 2015-04-20 DIAGNOSIS — J019 Acute sinusitis, unspecified: Secondary | ICD-10-CM | POA: Diagnosis not present

## 2015-04-20 DIAGNOSIS — E039 Hypothyroidism, unspecified: Secondary | ICD-10-CM | POA: Diagnosis not present

## 2015-04-20 DIAGNOSIS — I10 Essential (primary) hypertension: Secondary | ICD-10-CM | POA: Diagnosis not present

## 2015-04-22 DIAGNOSIS — H52213 Irregular astigmatism, bilateral: Secondary | ICD-10-CM | POA: Diagnosis not present

## 2015-04-22 DIAGNOSIS — H18833 Recurrent erosion of cornea, bilateral: Secondary | ICD-10-CM | POA: Diagnosis not present

## 2015-04-22 DIAGNOSIS — H18613 Keratoconus, stable, bilateral: Secondary | ICD-10-CM | POA: Diagnosis not present

## 2015-04-22 DIAGNOSIS — H1852 Epithelial (juvenile) corneal dystrophy: Secondary | ICD-10-CM | POA: Diagnosis not present

## 2015-05-10 DIAGNOSIS — Z111 Encounter for screening for respiratory tuberculosis: Secondary | ICD-10-CM | POA: Diagnosis not present

## 2015-05-12 ENCOUNTER — Ambulatory Visit (INDEPENDENT_AMBULATORY_CARE_PROVIDER_SITE_OTHER): Payer: Medicare Other | Admitting: Ophthalmology

## 2015-05-12 DIAGNOSIS — H2513 Age-related nuclear cataract, bilateral: Secondary | ICD-10-CM | POA: Diagnosis not present

## 2015-05-12 DIAGNOSIS — I10 Essential (primary) hypertension: Secondary | ICD-10-CM

## 2015-05-12 DIAGNOSIS — H401132 Primary open-angle glaucoma, bilateral, moderate stage: Secondary | ICD-10-CM | POA: Diagnosis not present

## 2015-05-12 DIAGNOSIS — H43813 Vitreous degeneration, bilateral: Secondary | ICD-10-CM | POA: Diagnosis not present

## 2015-05-12 DIAGNOSIS — H33303 Unspecified retinal break, bilateral: Secondary | ICD-10-CM | POA: Diagnosis not present

## 2015-05-12 DIAGNOSIS — H35033 Hypertensive retinopathy, bilateral: Secondary | ICD-10-CM

## 2015-05-12 DIAGNOSIS — D3132 Benign neoplasm of left choroid: Secondary | ICD-10-CM | POA: Diagnosis not present

## 2015-06-08 DIAGNOSIS — J019 Acute sinusitis, unspecified: Secondary | ICD-10-CM | POA: Diagnosis not present

## 2015-06-08 DIAGNOSIS — I10 Essential (primary) hypertension: Secondary | ICD-10-CM | POA: Diagnosis not present

## 2015-06-08 DIAGNOSIS — E039 Hypothyroidism, unspecified: Secondary | ICD-10-CM | POA: Diagnosis not present

## 2015-06-08 DIAGNOSIS — D869 Sarcoidosis, unspecified: Secondary | ICD-10-CM | POA: Diagnosis not present

## 2015-06-08 DIAGNOSIS — M545 Low back pain: Secondary | ICD-10-CM | POA: Diagnosis not present

## 2015-06-08 DIAGNOSIS — J309 Allergic rhinitis, unspecified: Secondary | ICD-10-CM | POA: Diagnosis not present

## 2015-07-08 DIAGNOSIS — H40029 Open angle with borderline findings, high risk, unspecified eye: Secondary | ICD-10-CM | POA: Diagnosis not present

## 2015-07-21 DIAGNOSIS — S0501XA Injury of conjunctiva and corneal abrasion without foreign body, right eye, initial encounter: Secondary | ICD-10-CM | POA: Diagnosis not present

## 2015-08-18 DIAGNOSIS — M810 Age-related osteoporosis without current pathological fracture: Secondary | ICD-10-CM | POA: Diagnosis not present

## 2015-08-24 DIAGNOSIS — H40029 Open angle with borderline findings, high risk, unspecified eye: Secondary | ICD-10-CM | POA: Diagnosis not present

## 2015-08-25 DIAGNOSIS — E039 Hypothyroidism, unspecified: Secondary | ICD-10-CM | POA: Diagnosis not present

## 2015-08-25 DIAGNOSIS — E210 Primary hyperparathyroidism: Secondary | ICD-10-CM | POA: Insufficient documentation

## 2015-08-25 DIAGNOSIS — M810 Age-related osteoporosis without current pathological fracture: Secondary | ICD-10-CM | POA: Diagnosis not present

## 2015-08-30 DIAGNOSIS — R001 Bradycardia, unspecified: Secondary | ICD-10-CM | POA: Diagnosis not present

## 2015-08-30 DIAGNOSIS — I340 Nonrheumatic mitral (valve) insufficiency: Secondary | ICD-10-CM | POA: Diagnosis not present

## 2015-08-30 DIAGNOSIS — R9431 Abnormal electrocardiogram [ECG] [EKG]: Secondary | ICD-10-CM | POA: Diagnosis not present

## 2015-08-30 DIAGNOSIS — E782 Mixed hyperlipidemia: Secondary | ICD-10-CM | POA: Diagnosis not present

## 2015-08-30 DIAGNOSIS — I119 Hypertensive heart disease without heart failure: Secondary | ICD-10-CM | POA: Diagnosis not present

## 2015-08-30 DIAGNOSIS — I10 Essential (primary) hypertension: Secondary | ICD-10-CM | POA: Diagnosis not present

## 2015-09-03 DIAGNOSIS — S0501XA Injury of conjunctiva and corneal abrasion without foreign body, right eye, initial encounter: Secondary | ICD-10-CM | POA: Diagnosis not present

## 2015-09-06 DIAGNOSIS — S0500XA Injury of conjunctiva and corneal abrasion without foreign body, unspecified eye, initial encounter: Secondary | ICD-10-CM | POA: Diagnosis not present

## 2015-09-28 DIAGNOSIS — I340 Nonrheumatic mitral (valve) insufficiency: Secondary | ICD-10-CM | POA: Diagnosis not present

## 2015-09-28 DIAGNOSIS — R001 Bradycardia, unspecified: Secondary | ICD-10-CM | POA: Diagnosis not present

## 2015-09-28 DIAGNOSIS — I10 Essential (primary) hypertension: Secondary | ICD-10-CM | POA: Diagnosis not present

## 2015-09-28 DIAGNOSIS — E782 Mixed hyperlipidemia: Secondary | ICD-10-CM | POA: Diagnosis not present

## 2015-09-28 DIAGNOSIS — I119 Hypertensive heart disease without heart failure: Secondary | ICD-10-CM | POA: Diagnosis not present

## 2015-09-28 DIAGNOSIS — R9431 Abnormal electrocardiogram [ECG] [EKG]: Secondary | ICD-10-CM | POA: Diagnosis not present

## 2015-10-04 DIAGNOSIS — S0500XA Injury of conjunctiva and corneal abrasion without foreign body, unspecified eye, initial encounter: Secondary | ICD-10-CM | POA: Diagnosis not present

## 2015-10-06 DIAGNOSIS — S0501XA Injury of conjunctiva and corneal abrasion without foreign body, right eye, initial encounter: Secondary | ICD-10-CM | POA: Diagnosis not present

## 2015-10-11 DIAGNOSIS — S0501XA Injury of conjunctiva and corneal abrasion without foreign body, right eye, initial encounter: Secondary | ICD-10-CM | POA: Diagnosis not present

## 2015-10-15 DIAGNOSIS — E039 Hypothyroidism, unspecified: Secondary | ICD-10-CM | POA: Diagnosis not present

## 2015-10-15 DIAGNOSIS — M545 Low back pain: Secondary | ICD-10-CM | POA: Diagnosis not present

## 2015-10-15 DIAGNOSIS — I10 Essential (primary) hypertension: Secondary | ICD-10-CM | POA: Diagnosis not present

## 2015-10-15 DIAGNOSIS — J309 Allergic rhinitis, unspecified: Secondary | ICD-10-CM | POA: Diagnosis not present

## 2015-10-15 DIAGNOSIS — Z0001 Encounter for general adult medical examination with abnormal findings: Secondary | ICD-10-CM | POA: Diagnosis not present

## 2015-10-18 DIAGNOSIS — H18833 Recurrent erosion of cornea, bilateral: Secondary | ICD-10-CM | POA: Diagnosis not present

## 2015-10-18 DIAGNOSIS — H18613 Keratoconus, stable, bilateral: Secondary | ICD-10-CM | POA: Diagnosis not present

## 2015-10-18 DIAGNOSIS — H1852 Epithelial (juvenile) corneal dystrophy: Secondary | ICD-10-CM | POA: Diagnosis not present

## 2015-10-18 DIAGNOSIS — H2513 Age-related nuclear cataract, bilateral: Secondary | ICD-10-CM | POA: Diagnosis not present

## 2015-10-18 DIAGNOSIS — H52213 Irregular astigmatism, bilateral: Secondary | ICD-10-CM | POA: Diagnosis not present

## 2015-10-26 DIAGNOSIS — R590 Localized enlarged lymph nodes: Secondary | ICD-10-CM | POA: Diagnosis not present

## 2015-10-26 DIAGNOSIS — Z1231 Encounter for screening mammogram for malignant neoplasm of breast: Secondary | ICD-10-CM | POA: Diagnosis not present

## 2015-10-27 DIAGNOSIS — H1852 Epithelial (juvenile) corneal dystrophy: Secondary | ICD-10-CM | POA: Diagnosis not present

## 2016-02-08 DIAGNOSIS — J019 Acute sinusitis, unspecified: Secondary | ICD-10-CM | POA: Diagnosis not present

## 2016-02-08 DIAGNOSIS — M545 Low back pain: Secondary | ICD-10-CM | POA: Diagnosis not present

## 2016-02-08 DIAGNOSIS — E039 Hypothyroidism, unspecified: Secondary | ICD-10-CM | POA: Diagnosis not present

## 2016-02-08 DIAGNOSIS — I10 Essential (primary) hypertension: Secondary | ICD-10-CM | POA: Diagnosis not present

## 2016-02-08 DIAGNOSIS — J309 Allergic rhinitis, unspecified: Secondary | ICD-10-CM | POA: Diagnosis not present

## 2016-02-16 DIAGNOSIS — R001 Bradycardia, unspecified: Secondary | ICD-10-CM | POA: Diagnosis not present

## 2016-02-16 DIAGNOSIS — I340 Nonrheumatic mitral (valve) insufficiency: Secondary | ICD-10-CM | POA: Diagnosis not present

## 2016-02-16 DIAGNOSIS — I119 Hypertensive heart disease without heart failure: Secondary | ICD-10-CM | POA: Diagnosis not present

## 2016-02-16 DIAGNOSIS — E782 Mixed hyperlipidemia: Secondary | ICD-10-CM | POA: Diagnosis not present

## 2016-02-16 DIAGNOSIS — I10 Essential (primary) hypertension: Secondary | ICD-10-CM | POA: Diagnosis not present

## 2016-02-17 DIAGNOSIS — H04123 Dry eye syndrome of bilateral lacrimal glands: Secondary | ICD-10-CM | POA: Diagnosis not present

## 2016-02-17 DIAGNOSIS — H1852 Epithelial (juvenile) corneal dystrophy: Secondary | ICD-10-CM | POA: Diagnosis not present

## 2016-03-07 DIAGNOSIS — E785 Hyperlipidemia, unspecified: Secondary | ICD-10-CM | POA: Diagnosis not present

## 2016-03-07 DIAGNOSIS — E210 Primary hyperparathyroidism: Secondary | ICD-10-CM | POA: Diagnosis not present

## 2016-03-07 DIAGNOSIS — M810 Age-related osteoporosis without current pathological fracture: Secondary | ICD-10-CM | POA: Diagnosis not present

## 2016-03-15 DIAGNOSIS — E039 Hypothyroidism, unspecified: Secondary | ICD-10-CM | POA: Diagnosis not present

## 2016-03-15 DIAGNOSIS — M810 Age-related osteoporosis without current pathological fracture: Secondary | ICD-10-CM | POA: Diagnosis not present

## 2016-03-15 DIAGNOSIS — E210 Primary hyperparathyroidism: Secondary | ICD-10-CM | POA: Diagnosis not present

## 2016-03-15 DIAGNOSIS — I10 Essential (primary) hypertension: Secondary | ICD-10-CM | POA: Diagnosis not present

## 2016-04-04 DIAGNOSIS — M810 Age-related osteoporosis without current pathological fracture: Secondary | ICD-10-CM | POA: Diagnosis not present

## 2016-04-26 DIAGNOSIS — H401132 Primary open-angle glaucoma, bilateral, moderate stage: Secondary | ICD-10-CM | POA: Diagnosis not present

## 2016-05-30 DIAGNOSIS — D869 Sarcoidosis, unspecified: Secondary | ICD-10-CM | POA: Diagnosis not present

## 2016-05-30 DIAGNOSIS — R0602 Shortness of breath: Secondary | ICD-10-CM | POA: Diagnosis not present

## 2016-05-30 DIAGNOSIS — I6523 Occlusion and stenosis of bilateral carotid arteries: Secondary | ICD-10-CM | POA: Diagnosis not present

## 2016-05-30 DIAGNOSIS — I10 Essential (primary) hypertension: Secondary | ICD-10-CM | POA: Diagnosis not present

## 2016-06-08 DIAGNOSIS — E039 Hypothyroidism, unspecified: Secondary | ICD-10-CM | POA: Diagnosis not present

## 2016-06-08 DIAGNOSIS — I10 Essential (primary) hypertension: Secondary | ICD-10-CM | POA: Diagnosis not present

## 2016-06-08 DIAGNOSIS — N182 Chronic kidney disease, stage 2 (mild): Secondary | ICD-10-CM | POA: Diagnosis not present

## 2016-06-23 DIAGNOSIS — R0602 Shortness of breath: Secondary | ICD-10-CM | POA: Diagnosis not present

## 2016-06-23 DIAGNOSIS — D869 Sarcoidosis, unspecified: Secondary | ICD-10-CM | POA: Diagnosis not present

## 2016-06-27 DIAGNOSIS — I10 Essential (primary) hypertension: Secondary | ICD-10-CM | POA: Diagnosis not present

## 2016-06-27 DIAGNOSIS — I420 Dilated cardiomyopathy: Secondary | ICD-10-CM | POA: Insufficient documentation

## 2016-06-27 DIAGNOSIS — D869 Sarcoidosis, unspecified: Secondary | ICD-10-CM | POA: Diagnosis not present

## 2016-07-06 DIAGNOSIS — H401131 Primary open-angle glaucoma, bilateral, mild stage: Secondary | ICD-10-CM | POA: Diagnosis not present

## 2016-07-06 DIAGNOSIS — H2513 Age-related nuclear cataract, bilateral: Secondary | ICD-10-CM | POA: Diagnosis not present

## 2016-08-11 DIAGNOSIS — I420 Dilated cardiomyopathy: Secondary | ICD-10-CM | POA: Diagnosis not present

## 2016-08-11 DIAGNOSIS — D869 Sarcoidosis, unspecified: Secondary | ICD-10-CM | POA: Diagnosis not present

## 2016-08-23 DIAGNOSIS — M810 Age-related osteoporosis without current pathological fracture: Secondary | ICD-10-CM | POA: Diagnosis not present

## 2016-08-23 DIAGNOSIS — E210 Primary hyperparathyroidism: Secondary | ICD-10-CM | POA: Diagnosis not present

## 2016-09-06 DIAGNOSIS — M810 Age-related osteoporosis without current pathological fracture: Secondary | ICD-10-CM | POA: Diagnosis not present

## 2016-09-06 DIAGNOSIS — E039 Hypothyroidism, unspecified: Secondary | ICD-10-CM | POA: Diagnosis not present

## 2016-09-06 DIAGNOSIS — E210 Primary hyperparathyroidism: Secondary | ICD-10-CM | POA: Diagnosis not present

## 2016-09-13 DIAGNOSIS — E039 Hypothyroidism, unspecified: Secondary | ICD-10-CM | POA: Diagnosis not present

## 2016-09-13 DIAGNOSIS — M810 Age-related osteoporosis without current pathological fracture: Secondary | ICD-10-CM | POA: Diagnosis not present

## 2016-09-13 DIAGNOSIS — E210 Primary hyperparathyroidism: Secondary | ICD-10-CM | POA: Diagnosis not present

## 2016-09-14 DIAGNOSIS — R0602 Shortness of breath: Secondary | ICD-10-CM | POA: Diagnosis not present

## 2016-09-14 DIAGNOSIS — D8685 Sarcoid myocarditis: Secondary | ICD-10-CM | POA: Diagnosis not present

## 2016-09-14 DIAGNOSIS — I6523 Occlusion and stenosis of bilateral carotid arteries: Secondary | ICD-10-CM | POA: Diagnosis not present

## 2016-09-14 DIAGNOSIS — I10 Essential (primary) hypertension: Secondary | ICD-10-CM | POA: Diagnosis not present

## 2016-09-15 DIAGNOSIS — D8685 Sarcoid myocarditis: Secondary | ICD-10-CM | POA: Insufficient documentation

## 2016-10-16 DIAGNOSIS — J309 Allergic rhinitis, unspecified: Secondary | ICD-10-CM | POA: Diagnosis not present

## 2016-10-16 DIAGNOSIS — D869 Sarcoidosis, unspecified: Secondary | ICD-10-CM | POA: Diagnosis not present

## 2016-10-16 DIAGNOSIS — E782 Mixed hyperlipidemia: Secondary | ICD-10-CM | POA: Diagnosis not present

## 2016-10-16 DIAGNOSIS — Z0001 Encounter for general adult medical examination with abnormal findings: Secondary | ICD-10-CM | POA: Diagnosis not present

## 2016-10-16 DIAGNOSIS — M810 Age-related osteoporosis without current pathological fracture: Secondary | ICD-10-CM | POA: Diagnosis not present

## 2016-10-16 DIAGNOSIS — E039 Hypothyroidism, unspecified: Secondary | ICD-10-CM | POA: Diagnosis not present

## 2016-10-16 DIAGNOSIS — I10 Essential (primary) hypertension: Secondary | ICD-10-CM | POA: Diagnosis not present

## 2016-10-26 DIAGNOSIS — I10 Essential (primary) hypertension: Secondary | ICD-10-CM | POA: Diagnosis not present

## 2016-10-26 DIAGNOSIS — D869 Sarcoidosis, unspecified: Secondary | ICD-10-CM | POA: Diagnosis not present

## 2016-10-26 DIAGNOSIS — D8685 Sarcoid myocarditis: Secondary | ICD-10-CM | POA: Diagnosis not present

## 2016-10-26 DIAGNOSIS — R0602 Shortness of breath: Secondary | ICD-10-CM | POA: Diagnosis not present

## 2016-10-26 DIAGNOSIS — I119 Hypertensive heart disease without heart failure: Secondary | ICD-10-CM | POA: Diagnosis not present

## 2016-10-31 DIAGNOSIS — Z1231 Encounter for screening mammogram for malignant neoplasm of breast: Secondary | ICD-10-CM | POA: Diagnosis not present

## 2017-03-09 DIAGNOSIS — E039 Hypothyroidism, unspecified: Secondary | ICD-10-CM | POA: Diagnosis not present

## 2017-03-09 DIAGNOSIS — E210 Primary hyperparathyroidism: Secondary | ICD-10-CM | POA: Diagnosis not present

## 2017-03-09 DIAGNOSIS — M810 Age-related osteoporosis without current pathological fracture: Secondary | ICD-10-CM | POA: Diagnosis not present

## 2017-03-15 DIAGNOSIS — E039 Hypothyroidism, unspecified: Secondary | ICD-10-CM | POA: Diagnosis not present

## 2017-03-15 DIAGNOSIS — E559 Vitamin D deficiency, unspecified: Secondary | ICD-10-CM | POA: Diagnosis not present

## 2017-03-15 DIAGNOSIS — M810 Age-related osteoporosis without current pathological fracture: Secondary | ICD-10-CM | POA: Diagnosis not present

## 2017-03-15 DIAGNOSIS — E210 Primary hyperparathyroidism: Secondary | ICD-10-CM | POA: Diagnosis not present

## 2017-04-16 ENCOUNTER — Encounter: Payer: Self-pay | Admitting: Nurse Practitioner

## 2017-04-16 ENCOUNTER — Ambulatory Visit (INDEPENDENT_AMBULATORY_CARE_PROVIDER_SITE_OTHER): Payer: Medicare Other | Admitting: Nurse Practitioner

## 2017-04-16 VITALS — BP 150/90 | HR 76 | Resp 16 | Ht <= 58 in | Wt 137.8 lb

## 2017-04-16 DIAGNOSIS — E079 Disorder of thyroid, unspecified: Secondary | ICD-10-CM | POA: Diagnosis not present

## 2017-04-16 DIAGNOSIS — H53149 Visual discomfort, unspecified: Secondary | ICD-10-CM | POA: Diagnosis not present

## 2017-04-16 DIAGNOSIS — J309 Allergic rhinitis, unspecified: Secondary | ICD-10-CM

## 2017-04-16 DIAGNOSIS — R0602 Shortness of breath: Secondary | ICD-10-CM | POA: Diagnosis not present

## 2017-04-16 DIAGNOSIS — J0141 Acute recurrent pansinusitis: Secondary | ICD-10-CM | POA: Diagnosis not present

## 2017-04-16 DIAGNOSIS — D869 Sarcoidosis, unspecified: Secondary | ICD-10-CM | POA: Diagnosis not present

## 2017-04-16 DIAGNOSIS — I10 Essential (primary) hypertension: Secondary | ICD-10-CM

## 2017-04-16 MED ORDER — CLONIDINE HCL 0.1 MG PO TABS: 0.1000 mg | ORAL_TABLET | Freq: Two times a day (BID) | ORAL | 3 refills | Status: DC

## 2017-04-16 MED ORDER — HYDRALAZINE HCL 50 MG PO TABS: 100.0000 mg | ORAL_TABLET | Freq: Three times a day (TID) | ORAL | 3 refills | Status: DC

## 2017-04-16 MED ORDER — SULFAMETHOXAZOLE-TRIMETHOPRIM 800-160 MG PO TABS: 1.0000 | ORAL_TABLET | Freq: Two times a day (BID) | ORAL | 0 refills | Status: DC

## 2017-04-16 NOTE — Progress Notes (Addendum)
[facility] [place], [place] [address]  Internal MEDICINE  Office Visit Note  Patient Name: Stacey [name]  [id number]  [id number]  Date of Service: 04/16/2017  Chief Complaint  Patient presents with  . Hypertension  . Sinusitis    round of z-pack did not help.     Hypertension  This is a chronic problem. The current episode started more than 1 year ago. The problem has been gradually worsening (patient has been out of a few blood pressure medicatioins for about a week. ) since onset. The problem is resistant. Associated symptoms include headaches, palpitations and peripheral edema. Pertinent negatives include no chest pain, neck pain or shortness of breath. Agents associated with hypertension include thyroid hormones. Risk factors for coronary artery disease include post-menopausal state, family history and dyslipidemia. Past treatments include angiotensin blockers and diuretics. The current treatment provides moderate improvement. Compliance problems include diet and exercise.  Identifiable causes of hypertension include chronic renal disease, hyperparathyroidism and a thyroid problem.  URI   This is a recurrent problem. The current episode started more than 1 month ago. The problem has been gradually improving. There has been no fever. Associated symptoms include congestion, coughing, headaches, rhinorrhea, sinus pain and sneezing. Pertinent negatives include no abdominal pain, chest pain, diarrhea, dysuria, nausea, neck pain, rash, sore throat, vomiting or wheezing. Treatments tried: round z-pack. The treatment provided mild relief.    Pt is here for routine follow up.    Current Medication: Outpatient Encounter Medications as of 04/16/2017  Medication Sig  . albuterol (VENTOLIN HFA) 108 (90 Base) MCG/ACT inhaler   . cloNIDine (CATAPRES) 0.1 MG tablet   . hydrALAZINE (APRESOLINE) 50 MG tablet Take 2 tab po tid  . levothyroxine (SYNTHROID, LEVOTHROID) 88 MCG  tablet TK 1 T PO D FOR HYPOTHYROID  . montelukast (SINGULAIR) 10 MG tablet TK 1 T PO D  . naproxen (NAPROSYN) 500 MG tablet Take 1 tablet (500 mg total) by mouth 2 (two) times daily with a meal.  . ondansetron (ZOFRAN) 8 MG tablet Take by mouth every 8 (eight) hours as needed for nausea or vomiting.  . pravastatin (PRAVACHOL) 20 MG tablet TAKE 1 TABLET(20 MG) BY MOUTH EVERY NIGHT  . famotidine (PEPCID) 20 MG tablet Take 1 tablet (20 mg total) by mouth daily.  . furosemide (LASIX) 20 MG tablet   . hydrOXYzine (ATARAX/VISTARIL) 25 MG tablet Take 1 tablet (25 mg total) by mouth 3 (three) times daily as needed. (Patient not taking: Reported on 04/16/2017)  . predniSONE (STERAPRED UNI-PAK 21 TAB) 10 MG (21) TBPK tablet Take 6 tablets on day 1 Take 5 tablets on day 2 Take 4 tablets on day 3 Take 3 tablets on day 4 Take 2 tablets on day 5 Take 1 tablet on day 6 (Patient not taking: Reported on 04/16/2017)   No facility-administered encounter medications on file as of 04/16/2017.     Surgical History: No past surgical history on file.  Medical History: Past Medical History:  Diagnosis Date  . Hypertension   . Thyroid disease     Family History: No family history on file.  Social History   Socioeconomic History  . Marital status: Widowed    Spouse name: Not on file  . Number of children: Not on file  . Years of education: Not on file  . Highest education level: Not on file  Social Needs  . Financial resource strain: Not on file  . Food insecurity - worry: Not  on file  . Food insecurity - inability: Not on file  . Transportation needs - medical: Not on file  . Transportation needs - non-medical: Not on file  Occupational History  . Not on file  Tobacco Use  . Smoking status: Never Smoker  . Smokeless tobacco: Never Used  Substance and Sexual Activity  . Alcohol use: No  . Drug use: No  . Sexual activity: Not on file  Other Topics Concern  . Not on file  Social History  Narrative  . Not on file      Review of Systems  Constitutional: Negative for activity change, appetite change, chills, fatigue and unexpected weight change.  HENT: Positive for congestion, rhinorrhea, sinus pain and sneezing. Negative for postnasal drip and sore throat.   Eyes: Negative for redness.  Respiratory: Positive for cough. Negative for apnea, chest tightness, shortness of breath and wheezing.   Cardiovascular: Positive for palpitations. Negative for chest pain.       Elevated blood pressure  Gastrointestinal: Negative for abdominal pain, constipation, diarrhea, nausea and vomiting.  Endocrine:       Hypothyroid/hypoparathyroid disease. Does see endocrinology.   Genitourinary: Negative for dysuria and frequency.  Musculoskeletal: Negative for arthralgias, back pain, joint swelling and neck pain.  Skin: Negative.  Negative for rash.  Allergic/Immunologic: Positive for environmental allergies.  Neurological: Positive for headaches. Negative for tremors and numbness.  Hematological: Negative.  Negative for adenopathy. Does not bruise/bleed easily.  Psychiatric/Behavioral: Negative for behavioral problems (Depression), sleep disturbance and suicidal ideas. The patient is not nervous/anxious.     Today's Vitals   04/16/17 1011  BP: (!) 150/90  Pulse: 76  Resp: 16  SpO2: 98%  Weight: 137 lb 12.8 oz (62.5 kg)  Height: 4\' 9"  (1.448 m)    Physical Exam  Constitutional: She is oriented to person, place, and time. She appears well-developed and well-nourished.  HENT:  Head: Normocephalic and atraumatic.  Right Ear: Tympanic membrane is erythematous and bulging.  Left Ear: External ear normal.  Nose: Rhinorrhea present. Right sinus exhibits frontal sinus tenderness. Left sinus exhibits frontal sinus tenderness.  Eyes: Pupils are equal, round, and reactive to light.  Neck: Normal range of motion. Neck supple. No JVD present. Carotid bruit is not present. No thyromegaly  present.  Cardiovascular: Normal rate, regular rhythm, S1 normal, S2 normal and normal pulses.  Murmur heard.  Systolic murmur is present with a grade of 2/6. Pulmonary/Chest: Effort normal and breath sounds normal. No respiratory distress. She has no wheezes.  Abdominal: Soft. There is no tenderness.  Musculoskeletal: Normal range of motion.  Neurological: She is alert and oriented to person, place, and time.  Skin: Skin is warm and dry.  Psychiatric: She has a normal mood and affect. Her behavior is normal. Judgment and thought content normal.  Nursing note and vitals reviewed.   Assessment/Plan:  1. Essential hypertension Generally stable, but patient needs med refills. - cloNIDine (CATAPRES) 0.1 MG tablet; Take 1 tablet (0.1 mg total) by mouth 2 (two) times daily.  Dispense: 60 tablet; Refill: 3 - hydrALAZINE (APRESOLINE) 50 MG tablet; Take 2 tablets (100 mg total) by mouth 3 (three) times daily. Take 2 tab po tid  Dispense: 180 tablet; Refill: 3  2. Acute recurrent pansinusitis - sulfamethoxazole-trimethoprim (BACTRIM DS,SEPTRA DS) 800-160 MG tablet; Take 1 tablet by mouth 2 (two) times daily.  Dispense: 20 tablet; Refill: 0  3. Allergic rhinitis, unspecified seasonality, unspecified trigger Continue allergy medications as prescribed   4. Thyroid  disease Continue levothryoxine as prescribed.   5. Sarcoidosis Will get repeat CT chest. Last exam done 2014 did show lyphadenopathy and 49mm nodule in left lung lobe.   The patient should have routine physical in 6 months. She should also follow up as needed   General Counseling: kevina piloto understanding of the findings of todays visit and agrees with plan of treatment. I have discussed any further diagnostic evaluation that may be needed or ordered today. We also reviewed her medications today. she has been encouraged to call the office with any questions or concerns that should arise related to todays visit.   This patient  was seen by Leretha Pol, FNP- C in Collaboration with Dr [name] as a part of collaborative care agreement  Time spent: 25 Minutes     Dr [name] Internal medicine

## 2017-04-20 DIAGNOSIS — M810 Age-related osteoporosis without current pathological fracture: Secondary | ICD-10-CM | POA: Diagnosis not present

## 2017-04-25 ENCOUNTER — Other Ambulatory Visit: Payer: Self-pay

## 2017-04-25 MED ORDER — CETIRIZINE HCL 10 MG PO TABS: 10.0000 mg | ORAL_TABLET | Freq: Every day | ORAL | 5 refills | Status: DC

## 2017-04-25 MED ORDER — LOSARTAN POTASSIUM 100 MG PO TABS: 100.0000 mg | ORAL_TABLET | Freq: Every day | ORAL | 3 refills | Status: DC

## 2017-04-26 ENCOUNTER — Ambulatory Visit
Admission: RE | Admit: 2017-04-26 | Discharge: 2017-04-26 | Disposition: A | Discharge status: Home / Self Care | Payer: Medicare Other | Source: Ambulatory Visit | Attending: Nurse Practitioner | Admitting: Nurse Practitioner

## 2017-04-26 DIAGNOSIS — R590 Localized enlarged lymph nodes: Secondary | ICD-10-CM | POA: Diagnosis not present

## 2017-04-26 DIAGNOSIS — I2510 Atherosclerotic heart disease of native coronary artery without angina pectoris: Secondary | ICD-10-CM | POA: Insufficient documentation

## 2017-04-26 DIAGNOSIS — R0602 Shortness of breath: Secondary | ICD-10-CM | POA: Insufficient documentation

## 2017-04-26 DIAGNOSIS — I700 Atherosclerosis of aorta: Secondary | ICD-10-CM | POA: Insufficient documentation

## 2017-05-16 ENCOUNTER — Ambulatory Visit: Payer: Self-pay | Admitting: Internal Medicine

## 2017-05-16 DIAGNOSIS — I340 Nonrheumatic mitral (valve) insufficiency: Secondary | ICD-10-CM | POA: Diagnosis not present

## 2017-05-16 DIAGNOSIS — I6523 Occlusion and stenosis of bilateral carotid arteries: Secondary | ICD-10-CM | POA: Diagnosis not present

## 2017-05-16 DIAGNOSIS — R0602 Shortness of breath: Secondary | ICD-10-CM | POA: Diagnosis not present

## 2017-05-16 DIAGNOSIS — I420 Dilated cardiomyopathy: Secondary | ICD-10-CM | POA: Diagnosis not present

## 2017-05-16 DIAGNOSIS — E782 Mixed hyperlipidemia: Secondary | ICD-10-CM | POA: Diagnosis not present

## 2017-05-22 ENCOUNTER — Encounter: Payer: Self-pay | Admitting: Internal Medicine

## 2017-05-22 ENCOUNTER — Ambulatory Visit (INDEPENDENT_AMBULATORY_CARE_PROVIDER_SITE_OTHER): Payer: Medicare Other | Admitting: Internal Medicine

## 2017-05-22 VITALS — BP 186/100 | HR 73 | Resp 16 | Ht <= 58 in | Wt 139.0 lb

## 2017-05-22 DIAGNOSIS — I10 Essential (primary) hypertension: Secondary | ICD-10-CM

## 2017-05-22 DIAGNOSIS — J449 Chronic obstructive pulmonary disease, unspecified: Secondary | ICD-10-CM

## 2017-05-22 DIAGNOSIS — D869 Sarcoidosis, unspecified: Secondary | ICD-10-CM | POA: Diagnosis not present

## 2017-05-22 DIAGNOSIS — I700 Atherosclerosis of aorta: Secondary | ICD-10-CM | POA: Diagnosis not present

## 2017-05-22 MED ORDER — ALBUTEROL SULFATE HFA 108 (90 BASE) MCG/ACT IN AERS: 2.0000 | INHALATION_SPRAY | Freq: Four times a day (QID) | RESPIRATORY_TRACT | 6 refills | Status: DC | PRN

## 2017-05-22 MED ORDER — FLUTICASONE PROPIONATE HFA 110 MCG/ACT IN AERO: INHALATION_SPRAY | RESPIRATORY_TRACT | 6 refills | Status: DC

## 2017-05-22 NOTE — Progress Notes (Signed)
[facility] [place], [place] [address]  Internal MEDICINE  Office Visit Note  Patient Name: Stacey [name]  [id number]  [id number]  Date of Service: 05/22/2017  Chief Complaint  Patient presents with  . Cough  . Sarcoidosis  . Hypertension    Cough  This is a recurrent (pt has h/o sarcoidosis) problem. The current episode started 1 to 4 weeks ago. The problem has been gradually improving. The cough is non-productive. Associated symptoms include postnasal drip and shortness of breath. Pertinent negatives include no chest pain, chills, eye redness, rash, rhinorrhea or sore throat. The symptoms are aggravated by dust and exercise. The treatment provided moderate relief. Her past medical history is significant for asthma.  Hypertension  This is a chronic problem. The current episode started more than 1 year ago. The problem is uncontrolled. Associated symptoms include shortness of breath. Pertinent negatives include no chest pain, neck pain or palpitations.    Pt is here to discuss results of CT scan of the chest due to ongoing cough.her bp is elevated here in the office today. She did saw her cardiologist few days ago and everything was normal   Current Medication: Outpatient Encounter Medications as of 05/22/2017  Medication Sig  . albuterol (VENTOLIN HFA) 108 (90 Base) MCG/ACT inhaler Inhale 2 puffs into the lungs every 6 (six) hours as needed for wheezing or shortness of breath.  . cetirizine (ZYRTEC) 10 MG tablet Take 1 tablet (10 mg total) by mouth daily.  . Cholecalciferol (VITAMIN D3) 1000 units CAPS Take by mouth.  . cinacalcet (SENSIPAR) 30 MG tablet Take one tablet by mouth three times per week.  . clobetasol ointment (TEMOVATE) 0.05 % APP AA BID PRF INFLAMMATORY DERMATITIS  . cloNIDine (CATAPRES) 0.1 MG tablet Take 1 tablet (0.1 mg total) by mouth 2 (two) times daily.  . furosemide (LASIX) 20 MG tablet   . hydrALAZINE (APRESOLINE) 50 MG tablet Take 2  tablets (100 mg total) by mouth 3 (three) times daily. Take 2 tab po tid  . latanoprost (XALATAN) 0.005 % ophthalmic solution Apply to eye.  . levothyroxine (SYNTHROID, LEVOTHROID) 88 MCG tablet TK 1 T PO D FOR HYPOTHYROID  . losartan (COZAAR) 100 MG tablet Take 1 tablet (100 mg total) by mouth daily.  . montelukast (SINGULAIR) 10 MG tablet TK 1 T PO D  . Multiple Vitamins-Minerals (MULTIVITAMIN ADULT EXTRA C PO) Take by mouth.  . naproxen (NAPROSYN) 500 MG tablet Take 1 tablet (500 mg total) by mouth 2 (two) times daily with a meal.  . ondansetron (ZOFRAN) 8 MG tablet Take by mouth every 8 (eight) hours as needed for nausea or vomiting.  . pantoprazole (PROTONIX) 40 MG tablet PRN  . pravastatin (PRAVACHOL) 20 MG tablet TAKE 1 TABLET(20 MG) BY MOUTH EVERY NIGHT  . timolol (TIMOPTIC) 0.25 % ophthalmic solution Apply to eye.  . [DISCONTINUED] albuterol (VENTOLIN HFA) 108 (90 Base) MCG/ACT inhaler   . famotidine (PEPCID) 20 MG tablet Take 1 tablet (20 mg total) by mouth daily.  . fluticasone (FLOVENT HFA) 110 MCG/ACT inhaler Take 2 puffs at bed time  . hydrOXYzine (ATARAX/VISTARIL) 25 MG tablet Take 1 tablet (25 mg total) by mouth 3 (three) times daily as needed. (Patient not taking: Reported on 04/16/2017)  . predniSONE (STERAPRED UNI-PAK 21 TAB) 10 MG (21) TBPK tablet Take 6 tablets on day 1 Take 5 tablets on day 2 Take 4 tablets on day 3 Take 3 tablets on day 4 Take 2 tablets  on day 5 Take 1 tablet on day 6 (Patient not taking: Reported on 04/16/2017)  . sulfamethoxazole-trimethoprim (BACTRIM DS,SEPTRA DS) 800-160 MG tablet Take 1 tablet by mouth 2 (two) times daily. (Patient not taking: Reported on 05/22/2017)   No facility-administered encounter medications on file as of 05/22/2017.     Surgical History: Past Surgical History:  Procedure Laterality Date  . GALLBLADDER SURGERY    . KNEE SURGERY    . thyroidectomy      Medical History: Past Medical History:  Diagnosis Date  .  Hypertension   . Thyroid disease     Family History: History reviewed. No pertinent family history.  Social History   Socioeconomic History  . Marital status: Widowed    Spouse name: Not on file  . Number of children: Not on file  . Years of education: Not on file  . Highest education level: Not on file  Social Needs  . Financial resource strain: Not on file  . Food insecurity - worry: Not on file  . Food insecurity - inability: Not on file  . Transportation needs - medical: Not on file  . Transportation needs - non-medical: Not on file  Occupational History  . Not on file  Tobacco Use  . Smoking status: Never Smoker  . Smokeless tobacco: Never Used  Substance and Sexual Activity  . Alcohol use: No  . Drug use: No  . Sexual activity: Not on file  Other Topics Concern  . Not on file  Social History Narrative  . Not on file    Review of Systems  Constitutional: Negative for chills, fatigue and unexpected weight change.  HENT: Positive for postnasal drip. Negative for congestion, rhinorrhea, sneezing and sore throat.   Eyes: Negative for redness.  Respiratory: Positive for cough and shortness of breath. Negative for chest tightness.   Cardiovascular: Negative for chest pain and palpitations.  Gastrointestinal: Negative for abdominal pain, constipation, diarrhea, nausea and vomiting.  Genitourinary: Negative for dysuria and frequency.  Musculoskeletal: Negative for arthralgias, back pain, joint swelling and neck pain.  Skin: Negative for rash.  Neurological: Negative.  Negative for tremors and numbness.  Hematological: Negative for adenopathy. Does not bruise/bleed easily.  Psychiatric/Behavioral: Negative for behavioral problems (Depression), sleep disturbance and suicidal ideas. The patient is not nervous/anxious.     Vital Signs: BP (!) 186/100 (BP Location: Left Arm, Patient Position: Sitting)   Pulse 73   Resp 16   Ht 4\' 9"  (1.448 m)   Wt 139 lb (63 kg)    SpO2 96%   BMI 30.08 kg/m    Physical Exam  Constitutional: She is oriented to person, place, and time. She appears well-developed and well-nourished. No distress.  HENT:  Head: Normocephalic and atraumatic.  Mouth/Throat: Oropharynx is clear and moist. No oropharyngeal exudate.  Eyes: EOM are normal. Pupils are equal, round, and reactive to light.  Neck: Normal range of motion. Neck supple. No tracheal deviation present.  Cardiovascular: Normal rate, regular rhythm and normal heart sounds. Exam reveals no gallop and no friction rub.  No murmur heard. Pulmonary/Chest: Effort normal. No respiratory distress. She has wheezes. She has no rales. She exhibits no tenderness.  Abdominal: Soft. Bowel sounds are normal.  Musculoskeletal: Normal range of motion.  Neurological: She is alert and oriented to person, place, and time. No cranial nerve deficit.  Skin: Skin is warm and dry. She is not diaphoretic.  Psychiatric: She has a normal mood and affect. Her behavior is normal. Judgment and  thought content normal.    Assessment/Plan: 1. Essential hypertension Pt is to call her cardiology for further intervention  2. Sarcoidosis Stable/ CT findings are discuused with   3. Atherosclerosis of aorta ([place]) Pt should be on a statin and asa. Emphasized. Evidence of disease on ct of the chest  4. Chronic obstructive pulmonary disease, unspecified COPD type (HCC) Add  fluticasone (FLOVENT HFA) 110 MCG/ACT inhaler; Take 2 puffs at bed time  Dispense: 1 Inhaler; Refill: 6 - albuterol (VENTOLIN HFA) 108 (90 Base) MCG/ACT inhaler; Inhale 2 puffs into the lungs every 6 (six) hours as needed for wheezing or shortness of breath.  Dispense: 1 Inhaler; Refill: 6  General Counseling: anmol paschen understanding of the findings of todays visit and agrees with plan of treatment. I have discussed any further diagnostic evaluation that may be needed or ordered today. We also reviewed her medications today.  she has been encouraged to call the office with any questions or concerns that should arise related to todays visit.  Meds ordered this encounter  Medications  . fluticasone (FLOVENT HFA) 110 MCG/ACT inhaler    Sig: Take 2 puffs at bed time    Dispense:  1 Inhaler    Refill:  6  . albuterol (VENTOLIN HFA) 108 (90 Base) MCG/ACT inhaler    Sig: Inhale 2 puffs into the lungs every 6 (six) hours as needed for wheezing or shortness of breath.    Dispense:  1 Inhaler    Refill:  6    Time spent:30Minutes          Dr [name] Internal medicine

## 2017-05-29 ENCOUNTER — Telehealth: Payer: Self-pay

## 2017-05-29 NOTE — Telephone Encounter (Signed)
Called patient lmom that we do not have samples of flovent that pt was given at last appt and if she wants please call so we can send in prescription to pharmacy/ [facility]

## 2017-07-11 DIAGNOSIS — H401131 Primary open-angle glaucoma, bilateral, mild stage: Secondary | ICD-10-CM | POA: Diagnosis not present

## 2017-07-11 DIAGNOSIS — H2513 Age-related nuclear cataract, bilateral: Secondary | ICD-10-CM | POA: Diagnosis not present

## 2017-07-28 ENCOUNTER — Other Ambulatory Visit: Payer: Self-pay | Admitting: Nurse Practitioner

## 2017-07-28 DIAGNOSIS — R601 Generalized edema: Secondary | ICD-10-CM

## 2017-07-28 DIAGNOSIS — D8685 Sarcoid myocarditis: Secondary | ICD-10-CM

## 2017-07-28 MED ORDER — FUROSEMIDE 20 MG PO TABS: 40.0000 mg | ORAL_TABLET | Freq: Every day | ORAL | 2 refills | Status: DC

## 2017-07-28 NOTE — Progress Notes (Signed)
Per request, sent in refill for the patients lasix 20mg , taking 1 to 2 tablets daily as needed. Sent to [facility] on [address].

## 2017-07-30 ENCOUNTER — Other Ambulatory Visit: Payer: Self-pay

## 2017-07-30 DIAGNOSIS — R601 Generalized edema: Secondary | ICD-10-CM

## 2017-07-30 MED ORDER — FUROSEMIDE 20 MG PO TABS: 40.0000 mg | ORAL_TABLET | Freq: Every day | ORAL | 2 refills | Status: DC

## 2017-08-31 ENCOUNTER — Other Ambulatory Visit: Payer: Self-pay

## 2017-08-31 MED ORDER — LOSARTAN POTASSIUM 100 MG PO TABS: 100.0000 mg | ORAL_TABLET | Freq: Every day | ORAL | 3 refills | Status: DC

## 2017-09-07 ENCOUNTER — Other Ambulatory Visit: Payer: Self-pay

## 2017-09-07 DIAGNOSIS — E210 Primary hyperparathyroidism: Secondary | ICD-10-CM | POA: Diagnosis not present

## 2017-09-07 DIAGNOSIS — E559 Vitamin D deficiency, unspecified: Secondary | ICD-10-CM | POA: Diagnosis not present

## 2017-09-07 DIAGNOSIS — E782 Mixed hyperlipidemia: Secondary | ICD-10-CM | POA: Diagnosis not present

## 2017-09-07 DIAGNOSIS — E039 Hypothyroidism, unspecified: Secondary | ICD-10-CM | POA: Diagnosis not present

## 2017-09-07 MED ORDER — LOSARTAN POTASSIUM 100 MG PO TABS: 100.0000 mg | ORAL_TABLET | Freq: Every day | ORAL | 3 refills | Status: DC

## 2017-09-10 DIAGNOSIS — H1859 Other hereditary corneal dystrophies: Secondary | ICD-10-CM | POA: Diagnosis not present

## 2017-09-10 DIAGNOSIS — H2513 Age-related nuclear cataract, bilateral: Secondary | ICD-10-CM | POA: Diagnosis not present

## 2017-09-10 DIAGNOSIS — H401131 Primary open-angle glaucoma, bilateral, mild stage: Secondary | ICD-10-CM | POA: Diagnosis not present

## 2017-09-14 DIAGNOSIS — M810 Age-related osteoporosis without current pathological fracture: Secondary | ICD-10-CM | POA: Diagnosis not present

## 2017-09-14 DIAGNOSIS — E210 Primary hyperparathyroidism: Secondary | ICD-10-CM | POA: Diagnosis not present

## 2017-09-14 DIAGNOSIS — E039 Hypothyroidism, unspecified: Secondary | ICD-10-CM | POA: Diagnosis not present

## 2017-09-22 ENCOUNTER — Other Ambulatory Visit: Payer: Self-pay | Admitting: Internal Medicine

## 2017-10-04 DIAGNOSIS — M79601 Pain in right arm: Secondary | ICD-10-CM | POA: Diagnosis not present

## 2017-10-18 ENCOUNTER — Encounter: Payer: Self-pay | Admitting: Nurse Practitioner

## 2017-10-24 ENCOUNTER — Other Ambulatory Visit: Payer: Self-pay

## 2017-10-24 DIAGNOSIS — I10 Essential (primary) hypertension: Secondary | ICD-10-CM

## 2017-10-24 MED ORDER — CLONIDINE HCL 0.1 MG PO TABS: 0.1000 mg | ORAL_TABLET | Freq: Two times a day (BID) | ORAL | 3 refills | Status: DC

## 2017-10-26 ENCOUNTER — Ambulatory Visit (INDEPENDENT_AMBULATORY_CARE_PROVIDER_SITE_OTHER): Payer: Medicare Other | Admitting: Nurse Practitioner

## 2017-10-26 ENCOUNTER — Encounter: Payer: Self-pay | Admitting: Nurse Practitioner

## 2017-10-26 VITALS — BP 120/80 | HR 79 | Resp 16 | Ht <= 58 in | Wt 136.0 lb

## 2017-10-26 DIAGNOSIS — I10 Essential (primary) hypertension: Secondary | ICD-10-CM | POA: Diagnosis not present

## 2017-10-26 DIAGNOSIS — J309 Allergic rhinitis, unspecified: Secondary | ICD-10-CM | POA: Diagnosis not present

## 2017-10-26 DIAGNOSIS — K219 Gastro-esophageal reflux disease without esophagitis: Secondary | ICD-10-CM

## 2017-10-26 DIAGNOSIS — E559 Vitamin D deficiency, unspecified: Secondary | ICD-10-CM | POA: Diagnosis not present

## 2017-10-26 DIAGNOSIS — Z1231 Encounter for screening mammogram for malignant neoplasm of breast: Secondary | ICD-10-CM

## 2017-10-26 DIAGNOSIS — E079 Disorder of thyroid, unspecified: Secondary | ICD-10-CM | POA: Diagnosis not present

## 2017-10-26 DIAGNOSIS — Z1239 Encounter for other screening for malignant neoplasm of breast: Secondary | ICD-10-CM

## 2017-10-26 DIAGNOSIS — Z0001 Encounter for general adult medical examination with abnormal findings: Secondary | ICD-10-CM | POA: Diagnosis not present

## 2017-10-26 MED ORDER — CETIRIZINE HCL 10 MG PO TABS: 10.0000 mg | ORAL_TABLET | Freq: Every day | ORAL | 4 refills | Status: DC

## 2017-10-26 MED ORDER — CLONIDINE HCL 0.1 MG PO TABS: 0.1000 mg | ORAL_TABLET | Freq: Two times a day (BID) | ORAL | 4 refills | Status: DC

## 2017-10-26 MED ORDER — HYDRALAZINE HCL 50 MG PO TABS: ORAL_TABLET | ORAL | 4 refills | Status: DC

## 2017-10-26 MED ORDER — PANTOPRAZOLE SODIUM 40 MG PO TBEC
40.0000 mg | DELAYED_RELEASE_TABLET | ORAL | 4 refills | Status: DC | PRN
Start: 2017-10-26 — End: 2018-01-23

## 2017-10-26 NOTE — Addendum Note (Signed)
Addended by: Leretha Pol on: 10/26/2017 01:34 PM   Modules accepted: Orders

## 2017-10-26 NOTE — Progress Notes (Signed)
[facility] [place], [place] [address]  Internal MEDICINE  Office Visit Note  Patient Name: Stacey [name]  [id number]  [id number]  Date of Service: 10/26/2017   Pt is here for routine health maintenance examination   Chief Complaint  Patient presents with  . Hypertension  . Medicare Wellness     Hypertension  This is a chronic problem. The current episode started more than 1 year ago. The problem is unchanged. The problem is controlled. Associated symptoms include headaches and peripheral edema. Pertinent negatives include no chest pain, neck pain, palpitations or shortness of breath. Agents associated with hypertension include thyroid hormones and NSAIDs. Risk factors for coronary artery disease include dyslipidemia and post-menopausal state. Past treatments include diuretics, lifestyle changes, direct vasodilators and angiotensin blockers. The current treatment provides moderate improvement. Compliance problems include exercise.  Hypertensive end-organ damage includes angina. Identifiable causes of hypertension include hyperparathyroidism and a thyroid problem.    Current Medication: Outpatient Encounter Medications as of 10/26/2017  Medication Sig  . albuterol (VENTOLIN HFA) 108 (90 Base) MCG/ACT inhaler Inhale 2 puffs into the lungs every 6 (six) hours as needed for wheezing or shortness of breath.  . cetirizine (ZYRTEC) 10 MG tablet Take 1 tablet (10 mg total) by mouth daily.  . Cholecalciferol (VITAMIN D3) 1000 units CAPS Take by mouth.  . cinacalcet (SENSIPAR) 30 MG tablet Take one tablet by mouth three times per week.  . clobetasol ointment (TEMOVATE) 0.05 % APP AA BID PRF INFLAMMATORY DERMATITIS  . cloNIDine (CATAPRES) 0.1 MG tablet Take 1 tablet (0.1 mg total) by mouth 2 (two) times daily.  . fluticasone (FLOVENT HFA) 110 MCG/ACT inhaler Take 2 puffs at bed time  . furosemide (LASIX) 20 MG tablet Take 2 tablets (40 mg total) by mouth daily.  .  hydrALAZINE (APRESOLINE) 50 MG tablet Take 2 tab po tid  . latanoprost (XALATAN) 0.005 % ophthalmic solution Apply to eye.  . levothyroxine (SYNTHROID, LEVOTHROID) 88 MCG tablet TK 1 T PO D FOR HYPOTHYROID  . losartan (COZAAR) 100 MG tablet Take 1 tablet (100 mg total) by mouth daily.  . montelukast (SINGULAIR) 10 MG tablet TAKE 1 TABLET BY MOUTH DAILY  . Multiple Vitamins-Minerals (MULTIVITAMIN ADULT EXTRA C PO) Take by mouth.  . naproxen (NAPROSYN) 500 MG tablet Take 1 tablet (500 mg total) by mouth 2 (two) times daily with a meal.  . ondansetron (ZOFRAN) 8 MG tablet Take by mouth every 8 (eight) hours as needed for nausea or vomiting.  . pantoprazole (PROTONIX) 40 MG tablet Take 1 tablet (40 mg total) by mouth as needed.  . pravastatin (PRAVACHOL) 20 MG tablet TAKE 1 TABLET(20 MG) BY MOUTH EVERY NIGHT  . timolol (TIMOPTIC) 0.25 % ophthalmic solution Apply to eye.  . [DISCONTINUED] cetirizine (ZYRTEC) 10 MG tablet Take 1 tablet (10 mg total) by mouth daily.  . [DISCONTINUED] cloNIDine (CATAPRES) 0.1 MG tablet Take 1 tablet (0.1 mg total) by mouth 2 (two) times daily.  . [DISCONTINUED] hydrALAZINE (APRESOLINE) 50 MG tablet Take 2 tablets (100 mg total) by mouth 3 (three) times daily. Take 2 tab po tid  . [DISCONTINUED] pantoprazole (PROTONIX) 40 MG tablet PRN  . famotidine (PEPCID) 20 MG tablet Take 1 tablet (20 mg total) by mouth daily.  . hydrOXYzine (ATARAX/VISTARIL) 25 MG tablet Take 1 tablet (25 mg total) by mouth 3 (three) times daily as needed. (Patient not taking: Reported on 10/26/2017)  . [DISCONTINUED] predniSONE (STERAPRED UNI-PAK 21 TAB) 10 MG (21) TBPK  tablet Take 6 tablets on day 1 Take 5 tablets on day 2 Take 4 tablets on day 3 Take 3 tablets on day 4 Take 2 tablets on day 5 Take 1 tablet on day 6 (Patient not taking: Reported on 10/26/2017)  . [DISCONTINUED] sulfamethoxazole-trimethoprim (BACTRIM DS,SEPTRA DS) 800-160 MG tablet Take 1 tablet by mouth 2 (two) times daily. (Patient  not taking: Reported on 05/22/2017)   No facility-administered encounter medications on file as of 10/26/2017.     Surgical History: Past Surgical History:  Procedure Laterality Date  . GALLBLADDER SURGERY    . KNEE SURGERY    . thyroidectomy      Medical History: Past Medical History:  Diagnosis Date  . Hypertension   . Thyroid disease     Family History: History reviewed. No pertinent family history.    Review of Systems  Constitutional: Negative for activity change, appetite change, chills, fatigue and unexpected weight change.  HENT: Negative for congestion, postnasal drip, rhinorrhea, sinus pain, sneezing and sore throat.   Eyes: Negative.  Negative for redness.  Respiratory: Negative for apnea, cough, chest tightness, shortness of breath and wheezing.   Cardiovascular: Negative for chest pain and palpitations.  Gastrointestinal: Negative for abdominal pain, constipation, diarrhea, nausea and vomiting.  Endocrine:       Hypothyroid/hypoparathyroid disease. Does see endocrinology.   Genitourinary: Negative.  Negative for dysuria and frequency.  Musculoskeletal: Negative for arthralgias, back pain, joint swelling, neck pain and neck stiffness.  Skin: Negative for color change and rash.  Allergic/Immunologic: Positive for environmental allergies.  Neurological: Positive for headaches. Negative for dizziness, tremors and numbness.  Hematological: Negative for adenopathy. Does not bruise/bleed easily.  Psychiatric/Behavioral: Negative for behavioral problems (Depression), dysphoric mood, sleep disturbance and suicidal ideas. The patient is not nervous/anxious.      Vital Signs: BP 120/80   Pulse 79   Resp 16   Ht 4\' 9"  (1.448 m)   Wt 136 lb (61.7 kg)   SpO2 98%   BMI 29.43 kg/m    Physical Exam  Constitutional: She is oriented to person, place, and time. She appears well-developed and well-nourished.  HENT:  Head: Normocephalic and atraumatic.  Right Ear:  External ear normal. Tympanic membrane is not erythematous and not bulging.  Left Ear: External ear normal.  Nose: Nose normal. No rhinorrhea. Right sinus exhibits no frontal sinus tenderness. Left sinus exhibits no frontal sinus tenderness.  Mouth/Throat: Oropharynx is clear and moist.  Eyes: Pupils are equal, round, and reactive to light. Conjunctivae and EOM are normal.  Neck: Normal range of motion. Neck supple. No JVD present. Carotid bruit is not present. No tracheal deviation present. No thyromegaly present.  Cardiovascular: Normal rate, regular rhythm, S1 normal, S2 normal, intact distal pulses and normal pulses.  Murmur heard.  Systolic murmur is present with a grade of 2/6. Pulmonary/Chest: Effort normal and breath sounds normal. No respiratory distress. She has no wheezes.  Abdominal: Soft. Bowel sounds are normal. There is no tenderness.  Musculoskeletal: Normal range of motion.  Lymphadenopathy:    She has no cervical adenopathy.  Neurological: She is alert and oriented to person, place, and time.  Skin: Skin is warm and dry. Capillary refill takes less than 2 seconds.  Psychiatric: She has a normal mood and affect. Her behavior is normal. Judgment and thought content normal.  Nursing note and vitals reviewed.  Assessment/Plan: 1. Encounter for general adult medical examination with abnormal findings Annual wellness visit today.  - UA/M w/rflx Culture,  Routine  2. Essential hypertension Doing well. Continue bp medications as prescribed. Regular visits with cardiology as scheduled.  - hydrALAZINE (APRESOLINE) 50 MG tablet; Take 2 tab po tid  Dispense: 540 tablet; Refill: 4 - cloNIDine (CATAPRES) 0.1 MG tablet; Take 1 tablet (0.1 mg total) by mouth 2 (two) times daily.  Dispense: 180 tablet; Refill: 4  3. Thyroid disease Well managed per endocrinology  4. Allergic rhinitis, unspecified seasonality, unspecified trigger - cetirizine (ZYRTEC) 10 MG tablet; Take 1 tablet (10  mg total) by mouth daily.  Dispense: 90 tablet; Refill: 4  5. Gastroesophageal reflux disease without esophagitis - pantoprazole (PROTONIX) 40 MG tablet; Take 1 tablet (40 mg total) by mouth as needed.  Dispense: 90 tablet; Refill: 4  7. Screening for breast cancer - MM DIGITAL SCREENING BILATERAL; Future  General Counseling: tynetta bachmann understanding of the findings of todays visit and agrees with plan of treatment. I have discussed any further diagnostic evaluation that may be needed or ordered today. We also reviewed her medications today. she has been encouraged to call the office with any questions or concerns that should arise related to todays visit.    Counseling:    Orders Placed This Encounter  Procedures  . MM DIGITAL SCREENING BILATERAL  . UA/M w/rflx Culture, Routine    Meds ordered this encounter  Medications  . hydrALAZINE (APRESOLINE) 50 MG tablet    Sig: Take 2 tab po tid    Dispense:  540 tablet    Refill:  4    Please fill as 90 day prescription    Order Specific Question:   Supervising Provider    Answer:   [name] [[id number]]  . cetirizine (ZYRTEC) 10 MG tablet    Sig: Take 1 tablet (10 mg total) by mouth daily.    Dispense:  90 tablet    Refill:  4    Please fill as 90 day prescription when time to fill. Thanks.    Order Specific Question:   Supervising Provider    Answer:   [name] [[id number]]  . cloNIDine (CATAPRES) 0.1 MG tablet    Sig: Take 1 tablet (0.1 mg total) by mouth 2 (two) times daily.    Dispense:  180 tablet    Refill:  4    Please fill as 90 day prescription.    Order Specific Question:   Supervising Provider    Answer:   [name] [[id number]]  . pantoprazole (PROTONIX) 40 MG tablet    Sig: Take 1 tablet (40 mg total) by mouth as needed.    Dispense:  90 tablet    Refill:  4    Please fill as 90 day prescription.    Order Specific Question:   Supervising Provider    Answer:   [name] [[id number]]     This patient was  seen by Leretha Pol FNP Collaboration with Dr [name] as a part of collaborative care agreement  Time spent: [place], MD  Internal Medicine

## 2017-11-01 DIAGNOSIS — H25813 Combined forms of age-related cataract, bilateral: Secondary | ICD-10-CM | POA: Diagnosis not present

## 2017-11-01 DIAGNOSIS — H018 Other specified inflammations of eyelid: Secondary | ICD-10-CM | POA: Diagnosis not present

## 2017-11-01 DIAGNOSIS — H04123 Dry eye syndrome of bilateral lacrimal glands: Secondary | ICD-10-CM | POA: Diagnosis not present

## 2017-11-12 ENCOUNTER — Other Ambulatory Visit: Payer: Self-pay

## 2017-11-12 DIAGNOSIS — J309 Allergic rhinitis, unspecified: Secondary | ICD-10-CM

## 2017-11-12 MED ORDER — CETIRIZINE HCL 10 MG PO TABS: 10.0000 mg | ORAL_TABLET | Freq: Every day | ORAL | 4 refills | Status: DC

## 2017-11-15 DIAGNOSIS — I340 Nonrheumatic mitral (valve) insufficiency: Secondary | ICD-10-CM | POA: Diagnosis not present

## 2017-11-15 DIAGNOSIS — I119 Hypertensive heart disease without heart failure: Secondary | ICD-10-CM | POA: Diagnosis not present

## 2017-11-15 DIAGNOSIS — E782 Mixed hyperlipidemia: Secondary | ICD-10-CM | POA: Diagnosis not present

## 2017-11-15 DIAGNOSIS — I6523 Occlusion and stenosis of bilateral carotid arteries: Secondary | ICD-10-CM | POA: Diagnosis not present

## 2017-11-15 DIAGNOSIS — D8685 Sarcoid myocarditis: Secondary | ICD-10-CM | POA: Diagnosis not present

## 2017-11-15 DIAGNOSIS — R9431 Abnormal electrocardiogram [ECG] [EKG]: Secondary | ICD-10-CM | POA: Diagnosis not present

## 2017-11-15 DIAGNOSIS — I420 Dilated cardiomyopathy: Secondary | ICD-10-CM | POA: Diagnosis not present

## 2017-11-15 DIAGNOSIS — R0602 Shortness of breath: Secondary | ICD-10-CM | POA: Diagnosis not present

## 2017-11-15 DIAGNOSIS — I10 Essential (primary) hypertension: Secondary | ICD-10-CM | POA: Diagnosis not present

## 2017-11-20 ENCOUNTER — Other Ambulatory Visit: Payer: Self-pay | Admitting: Nurse Practitioner

## 2017-11-20 MED ORDER — PRAVASTATIN SODIUM 20 MG PO TABS: ORAL_TABLET | ORAL | 1 refills | Status: DC

## 2017-12-13 DIAGNOSIS — I6523 Occlusion and stenosis of bilateral carotid arteries: Secondary | ICD-10-CM | POA: Diagnosis not present

## 2017-12-13 DIAGNOSIS — I10 Essential (primary) hypertension: Secondary | ICD-10-CM | POA: Diagnosis not present

## 2017-12-13 DIAGNOSIS — I420 Dilated cardiomyopathy: Secondary | ICD-10-CM | POA: Diagnosis not present

## 2017-12-13 DIAGNOSIS — E782 Mixed hyperlipidemia: Secondary | ICD-10-CM | POA: Diagnosis not present

## 2017-12-13 DIAGNOSIS — I340 Nonrheumatic mitral (valve) insufficiency: Secondary | ICD-10-CM | POA: Diagnosis not present

## 2017-12-21 ENCOUNTER — Ambulatory Visit
Admission: RE | Admit: 2017-12-21 | Discharge: 2017-12-21 | Disposition: A | Discharge status: Home / Self Care | Payer: Medicare Other | Source: Ambulatory Visit | Attending: Nurse Practitioner | Admitting: Nurse Practitioner

## 2017-12-21 ENCOUNTER — Other Ambulatory Visit: Payer: Self-pay | Admitting: Nurse Practitioner

## 2017-12-21 DIAGNOSIS — Z1231 Encounter for screening mammogram for malignant neoplasm of breast: Secondary | ICD-10-CM | POA: Diagnosis not present

## 2017-12-21 DIAGNOSIS — Z1239 Encounter for other screening for malignant neoplasm of breast: Secondary | ICD-10-CM | POA: Diagnosis not present

## 2017-12-26 ENCOUNTER — Other Ambulatory Visit: Payer: Self-pay | Admitting: Nurse Practitioner

## 2017-12-26 MED ORDER — MONTELUKAST SODIUM 10 MG PO TABS: 10.0000 mg | ORAL_TABLET | Freq: Every day | ORAL | 2 refills | Status: DC

## 2018-01-12 ENCOUNTER — Encounter (HOSPITAL_COMMUNITY): Payer: Self-pay | Admitting: Emergency Medicine

## 2018-01-12 ENCOUNTER — Inpatient Hospital Stay (HOSPITAL_COMMUNITY)
Admission: EM | Admit: 2018-01-12 | Discharge: 2018-01-16 | DRG: 291 | Disposition: A | Discharge status: Home Health | Payer: Medicare Other | Attending: Internal Medicine | Admitting: Internal Medicine

## 2018-01-12 ENCOUNTER — Emergency Department (HOSPITAL_COMMUNITY): Payer: Medicare Other

## 2018-01-12 DIAGNOSIS — M11241 Other chondrocalcinosis, right hand: Secondary | ICD-10-CM | POA: Diagnosis present

## 2018-01-12 DIAGNOSIS — I169 Hypertensive crisis, unspecified: Secondary | ICD-10-CM

## 2018-01-12 DIAGNOSIS — I420 Dilated cardiomyopathy: Secondary | ICD-10-CM | POA: Diagnosis present

## 2018-01-12 DIAGNOSIS — I110 Hypertensive heart disease with heart failure: Secondary | ICD-10-CM | POA: Diagnosis not present

## 2018-01-12 DIAGNOSIS — I161 Hypertensive emergency: Secondary | ICD-10-CM | POA: Diagnosis not present

## 2018-01-12 DIAGNOSIS — Z79899 Other long term (current) drug therapy: Secondary | ICD-10-CM

## 2018-01-12 DIAGNOSIS — Z79890 Hormone replacement therapy (postmenopausal): Secondary | ICD-10-CM

## 2018-01-12 DIAGNOSIS — J811 Chronic pulmonary edema: Secondary | ICD-10-CM | POA: Diagnosis not present

## 2018-01-12 DIAGNOSIS — D869 Sarcoidosis, unspecified: Secondary | ICD-10-CM | POA: Diagnosis present

## 2018-01-12 DIAGNOSIS — E890 Postprocedural hypothyroidism: Secondary | ICD-10-CM | POA: Diagnosis present

## 2018-01-12 DIAGNOSIS — M79641 Pain in right hand: Secondary | ICD-10-CM

## 2018-01-12 DIAGNOSIS — I5033 Acute on chronic diastolic (congestive) heart failure: Secondary | ICD-10-CM

## 2018-01-12 DIAGNOSIS — J9601 Acute respiratory failure with hypoxia: Secondary | ICD-10-CM | POA: Diagnosis not present

## 2018-01-12 DIAGNOSIS — E669 Obesity, unspecified: Secondary | ICD-10-CM | POA: Diagnosis present

## 2018-01-12 DIAGNOSIS — E782 Mixed hyperlipidemia: Secondary | ICD-10-CM | POA: Diagnosis present

## 2018-01-12 DIAGNOSIS — I5043 Acute on chronic combined systolic (congestive) and diastolic (congestive) heart failure: Secondary | ICD-10-CM | POA: Diagnosis present

## 2018-01-12 DIAGNOSIS — J449 Chronic obstructive pulmonary disease, unspecified: Secondary | ICD-10-CM | POA: Diagnosis present

## 2018-01-12 DIAGNOSIS — D8685 Sarcoid myocarditis: Secondary | ICD-10-CM | POA: Diagnosis present

## 2018-01-12 DIAGNOSIS — Z7951 Long term (current) use of inhaled steroids: Secondary | ICD-10-CM

## 2018-01-12 DIAGNOSIS — N179 Acute kidney failure, unspecified: Secondary | ICD-10-CM | POA: Diagnosis not present

## 2018-01-12 DIAGNOSIS — R0602 Shortness of breath: Secondary | ICD-10-CM | POA: Diagnosis not present

## 2018-01-12 DIAGNOSIS — Z23 Encounter for immunization: Secondary | ICD-10-CM

## 2018-01-12 DIAGNOSIS — Z6832 Body mass index (BMI) 32.0-32.9, adult: Secondary | ICD-10-CM

## 2018-01-12 DIAGNOSIS — I10 Essential (primary) hypertension: Secondary | ICD-10-CM

## 2018-01-12 DIAGNOSIS — M10041 Idiopathic gout, right hand: Secondary | ICD-10-CM | POA: Diagnosis present

## 2018-01-12 LAB — CBC WITH DIFFERENTIAL/PLATELET
Abs Immature Granulocytes: 0.03 10*3/uL (ref 0.00–0.07)
BASOS ABS: 0.1 10*3/uL (ref 0.0–0.1)
BASOS PCT: 1 %
EOS ABS: 0.1 10*3/uL (ref 0.0–0.5)
EOS PCT: 1 %
HCT: 48.7 % — ABNORMAL HIGH (ref 36.0–46.0)
Hemoglobin: 14.6 g/dL (ref 12.0–15.0)
Immature Granulocytes: 0 %
Lymphocytes Relative: 46 %
Lymphs Abs: 4.8 10*3/uL — ABNORMAL HIGH (ref 0.7–4.0)
MCH: 27.1 pg (ref 26.0–34.0)
MCHC: 30 g/dL (ref 30.0–36.0)
MCV: 90.4 fL (ref 80.0–100.0)
Monocytes Absolute: 0.8 10*3/uL (ref 0.1–1.0)
Monocytes Relative: 8 %
NRBC: 0 % (ref 0.0–0.2)
Neutro Abs: 4.5 10*3/uL (ref 1.7–7.7)
Neutrophils Relative %: 44 %
Platelets: 256 10*3/uL (ref 150–400)
RBC: 5.39 MIL/uL — AB (ref 3.87–5.11)
RDW: 14.9 % (ref 11.5–15.5)
WBC: 10.2 10*3/uL (ref 4.0–10.5)

## 2018-01-12 LAB — BASIC METABOLIC PANEL
Anion gap: 15 (ref 5–15)
BUN: 17 mg/dL (ref 8–23)
CALCIUM: 11.1 mg/dL — AB (ref 8.9–10.3)
CHLORIDE: 110 mmol/L (ref 98–111)
CO2: 17 mmol/L — ABNORMAL LOW (ref 22–32)
CREATININE: 1.62 mg/dL — AB (ref 0.44–1.00)
GFR, EST AFRICAN AMERICAN: 36 mL/min — AB (ref >60)
GFR, EST NON AFRICAN AMERICAN: 31 mL/min — AB (ref >60)
Glucose, Bld: 273 mg/dL — ABNORMAL HIGH (ref 70–99)
Potassium: 4 mmol/L (ref 3.5–5.1)
SODIUM: 142 mmol/L (ref 135–145)

## 2018-01-12 LAB — HEPATIC FUNCTION PANEL
ALK PHOS: 67 U/L (ref 38–126)
ALT: 21 U/L (ref 0–44)
AST: 34 U/L (ref 15–41)
Albumin: 3.6 g/dL (ref 3.5–5.0)
BILIRUBIN TOTAL: 0.5 mg/dL (ref 0.3–1.2)
Bilirubin, Direct: 0.2 mg/dL (ref 0.0–0.2)
Indirect Bilirubin: 0.3 mg/dL (ref 0.3–0.9)
Total Protein: 7.9 g/dL (ref 6.5–8.1)

## 2018-01-12 LAB — I-STAT TROPONIN, ED: TROPONIN I, POC: 0.05 ng/mL (ref 0.00–0.08)

## 2018-01-12 LAB — BRAIN NATRIURETIC PEPTIDE: B Natriuretic Peptide: 490.3 pg/mL — ABNORMAL HIGH (ref 0.0–100.0)

## 2018-01-12 LAB — TROPONIN I: Troponin I: 0.05 ng/mL (ref <0.03)

## 2018-01-12 MED ORDER — ASPIRIN EC 81 MG PO TBEC: 81.0000 mg | DELAYED_RELEASE_TABLET | Freq: Once | ORAL | Status: DC

## 2018-01-12 MED ORDER — ONDANSETRON HCL 4 MG/2ML IJ SOLN
INTRAMUSCULAR | Status: AC
  Administered 2018-01-12: 4 mg
  Filled 2018-01-12: qty 2

## 2018-01-12 MED ORDER — ASPIRIN 81 MG PO CHEW
CHEWABLE_TABLET | ORAL | Status: AC
  Filled 2018-01-12: qty 4

## 2018-01-12 MED ORDER — ASPIRIN 81 MG PO CHEW
324.0000 mg | CHEWABLE_TABLET | Freq: Once | ORAL | Status: AC
  Administered 2018-01-12: 324 mg via ORAL

## 2018-01-12 MED ORDER — ALBUTEROL (5 MG/ML) CONTINUOUS INHALATION SOLN
10.0000 mg/h | INHALATION_SOLUTION | Freq: Once | RESPIRATORY_TRACT | Status: DC
  Administered 2018-01-12: 10 mg/h via RESPIRATORY_TRACT

## 2018-01-12 MED ORDER — FUROSEMIDE 10 MG/ML IJ SOLN
40.0000 mg | Freq: Once | INTRAMUSCULAR | Status: AC
  Administered 2018-01-13: 40 mg via INTRAVENOUS
  Filled 2018-01-12: qty 4

## 2018-01-12 MED ORDER — NITROGLYCERIN IN D5W 200-5 MCG/ML-% IV SOLN
INTRAVENOUS | Status: AC
  Filled 2018-01-12: qty 250

## 2018-01-12 MED ORDER — NITROGLYCERIN IN D5W 200-5 MCG/ML-% IV SOLN
0.0000 ug/min | Freq: Once | INTRAVENOUS | Status: AC
  Administered 2018-01-12: 5 ug/min via INTRAVENOUS

## 2018-01-12 MED ORDER — NITROGLYCERIN IN D5W 200-5 MCG/ML-% IV SOLN: 0.0000 ug/min | Freq: Once | INTRAVENOUS | Status: DC

## 2018-01-12 MED ORDER — ALBUTEROL (5 MG/ML) CONTINUOUS INHALATION SOLN: 10.0000 mg/h | INHALATION_SOLUTION | Freq: Once | RESPIRATORY_TRACT | Status: DC

## 2018-01-12 MED ORDER — ALBUTEROL (5 MG/ML) CONTINUOUS INHALATION SOLN
INHALATION_SOLUTION | RESPIRATORY_TRACT | Status: AC
  Administered 2018-01-12: 10 mg/h via RESPIRATORY_TRACT
  Filled 2018-01-12: qty 20

## 2018-01-12 NOTE — ED Provider Notes (Signed)
[facility] Provider Note   CSN: [id number] Arrival date & time: 01/12/18  2140     History   Chief Complaint Chief Complaint  Patient presents with  . Shortness of Breath  . Excessive Sweating  . Pallor    HPI Stacey [name] is a 72 y.o. female.  HPI Patient has history of COPD.  By review of EMR patient also has history of cardiomyopathy echo 4\2018 EF of 51% grade 3 diastolic dysfunction.  Patient was coming to the hospital to visit somebody.  She reports on the way she started getting very short of breath.  After getting here, it got worse quickly.  No chest pain.  She reports she vomited once.  She denies she has been having any swelling in her legs or pain in her calves.  No history of DVT.  She reports she has been having a rather dry cough.  He also has history of COPD, non-smoker.  No recent fevers or productive cough.  Patient reports she has hypertension and is compliant with medications. Past Medical History:  Diagnosis Date  . Hypertension   . Thyroid disease     Patient Active Problem List   Diagnosis Date Noted  . Screening for breast cancer 10/26/2017  . Allergic rhinitis 10/26/2017  . Vitamin D deficiency 10/26/2017  . Gastroesophageal reflux disease without esophagitis 10/26/2017  . Hypertension 04/16/2017  . Thyroid disease 04/16/2017  . 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 Dilated cardiomyopathy ([place]) 06/27/2016  . Bilateral carotid artery stenosis 05/30/2016  . SOBOE (shortness of breath on exertion) 05/30/2016  . Hyperparathyroidism, primary ([place]) 08/25/2015  . Hypercalcemia 08/12/2014  . Abnormal ECG 07/09/2014  . LVH (left ventricular hypertrophy) due to hypertensive disease, without heart failure 07/09/2014  . Moderate mitral insufficiency 07/09/2014  . Benign essential hypertension 07/06/2014  . 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 Mixed hyperlipidemia 01/08/2014  .  Chronic pelvic pain in female 05/31/2011  . Fibroids 05/31/2011  . Sarcoidosis 05/31/2011    Past Surgical History:  Procedure Laterality Date  . GALLBLADDER SURGERY    . KNEE SURGERY    . thyroidectomy       OB History   None      Home Medications    Prior to Admission medications   Medication Sig Start Date End Date Taking? Authorizing Provider  albuterol (VENTOLIN HFA) 108 (90 Base) MCG/ACT inhaler Inhale 2 puffs into the lungs every 6 (six) hours as needed for wheezing or shortness of breath. 05/22/17   [name], MD  cetirizine (ZYRTEC) 10 MG tablet Take 1 tablet (10 mg total) by mouth daily. 11/12/17   Ronnell Freshwater, NP  Cholecalciferol (VITAMIN D3) 1000 units CAPS Take by mouth. 05/21/09   [provider]  cinacalcet (SENSIPAR) 30 MG tablet Take one tablet by mouth three times per week. 03/15/16   [provider]  clobetasol ointment (TEMOVATE) 0.05 % APP AA BID PRF INFLAMMATORY DERMATITIS 02/06/16   [provider]  cloNIDine (CATAPRES) 0.1 MG tablet Take 1 tablet (0.1 mg total) by mouth 2 (two) times daily. 10/26/17   Ronnell Freshwater, NP  famotidine (PEPCID) 20 MG tablet Take 1 tablet (20 mg total) by mouth daily. 12/14/14 12/14/15  Victorino Dike, FNP  fluticasone (FLOVENT HFA) 110 MCG/ACT inhaler Take 2 puffs at bed time 05/22/17   [name], MD  furosemide (LASIX) 20 MG tablet Take 2 tablets (40 mg total) by mouth daily. 07/30/17  Ronnell Freshwater, NP  hydrALAZINE (APRESOLINE) 50 MG tablet Take 2 tab po tid 10/26/17   Ronnell Freshwater, NP  hydrOXYzine (ATARAX/VISTARIL) 25 MG tablet Take 1 tablet (25 mg total) by mouth 3 (three) times daily as needed. Patient not taking: Reported on 10/26/2017 12/14/14   Sherrie George B, FNP  latanoprost (XALATAN) 0.005 % ophthalmic solution Apply to eye. 01/16/14   [provider]  levothyroxine (SYNTHROID, LEVOTHROID) 88 MCG tablet TK 1 T PO D FOR HYPOTHYROID 03/11/17   [provider]    losartan (COZAAR) 100 MG tablet Take 1 tablet (100 mg total) by mouth daily. 09/07/17   Ronnell Freshwater, NP  montelukast (SINGULAIR) 10 MG tablet Take 1 tablet (10 mg total) by mouth daily. 12/26/17   Ronnell Freshwater, NP  Multiple Vitamins-Minerals (MULTIVITAMIN ADULT EXTRA C PO) Take by mouth. 07/30/09   [provider]  naproxen (NAPROSYN) 500 MG tablet Take 1 tablet (500 mg total) by mouth 2 (two) times daily with a meal. 09/24/14   Sable Feil, PA-C  ondansetron (ZOFRAN) 8 MG tablet Take by mouth every 8 (eight) hours as needed for nausea or vomiting.    [provider]  pantoprazole (PROTONIX) 40 MG tablet Take 1 tablet (40 mg total) by mouth as needed. 10/26/17   Ronnell Freshwater, NP  pravastatin (PRAVACHOL) 20 MG tablet TAKE 1 TABLET(20 MG) BY MOUTH EVERY NIGHT 11/20/17   Ronnell Freshwater, NP  timolol (TIMOPTIC) 0.25 % ophthalmic solution Apply to eye. 07/30/09   [provider]    Family History No family history on file.  Social History Social History   Tobacco Use  . Smoking status: Never Smoker  . Smokeless tobacco: Never Used  Substance Use Topics  . Alcohol use: No  . Drug use: Yes    Types: "Crack" cocaine     Allergies   Codeine; Propoxyphene; Alendronate; Ciprofloxacin; Lisinopril; Amlodipine; Aspirin; Tramadol; and Vicodin [hydrocodone-acetaminophen]   Review of Systems Review of Systems 10 Systems reviewed and are negative for acute change except as noted in the HPI.  Physical Exam Updated Vital Signs BP (!) 156/99   Pulse 96   Temp (!) 95.7 F (35.4 C) (Temporal)   Resp (!) 25   SpO2 95%   Physical Exam  Constitutional: She is oriented to person, place, and time.  Patient has moderate increased work of breathing.  He is slightly diaphoretic.  Mental status clear.  Ill appearance.  HENT:  Head: Normocephalic and atraumatic.  Mouth/Throat: Oropharynx is clear and moist.  Eyes: EOM are normal.  Cardiovascular:   Tachycardia.  No gross rub murmur gallop.  Pulmonary/Chest:  Moderate increased work of breathing.  Diffuse crackles throughout.  Abdominal: Soft. She exhibits no distension. There is no tenderness. There is no guarding.  Musculoskeletal:  No lower extremity edema.  Calves are soft and nontender.  Feet are warm and dry.  Dorsalis pedis pulses 1+ and symmetric  Neurological: She is alert and oriented to person, place, and time. No cranial nerve deficit. She exhibits normal muscle tone. Coordination normal.  Skin:  Skin is warm and slightly diaphoretic.     ED Treatments / Results  Labs (all labs ordered are listed, but only abnormal results are displayed) Labs Reviewed  BASIC METABOLIC PANEL - Abnormal; Notable for the following components:      Result Value   CO2 17 (*)    Glucose, Bld 273 (*)    Creatinine, Ser 1.62 (*)  Calcium 11.1 (*)    GFR calc non Af Amer 31 (*)    GFR calc Af Amer 36 (*)    All other components within normal limits  CBC WITH DIFFERENTIAL/PLATELET - Abnormal; Notable for the following components:   RBC 5.39 (*)    HCT 48.7 (*)    Lymphs Abs 4.8 (*)    All other components within normal limits  BRAIN NATRIURETIC PEPTIDE - Abnormal; Notable for the following components:   B Natriuretic Peptide 490.3 (*)    All other components within normal limits  TROPONIN I - Abnormal; Notable for the following components:   Troponin I 0.05 (*)    All other components within normal limits  CULTURE, BLOOD (ROUTINE X 2)  CULTURE, BLOOD (ROUTINE X 2)  HEPATIC FUNCTION PANEL  I-STAT TROPONIN, ED  I-STAT CG4 LACTIC ACID, ED    EKG Sinus rhythm 134 QRS 18ms Poor R wave progression LVH anterior ST elevation appears to be present on prior comparison EKG but more pronounced at tachycardic rate. Abnormal EKG.   Radiology Dg Chest Portable 1 View  Result Date: 01/12/2018 CLINICAL DATA:  Respiratory distress and dyspnea. EXAM: PORTABLE CHEST 1 VIEW COMPARISON:   04/26/2017 CXR FINDINGS: Cardiomegaly with interstitial edema. Slightly more confluent retrocardiac opacities are noted with air bronchograms. Superimposed pneumonia would be difficult to entirely exclude. Mild aortic atherosclerosis at the arch. Lung apices are excluded on this AP portable semi upright view. IMPRESSION: Cardiomegaly with interstitial edema and mild aortic atherosclerosis. Air bronchograms in the retrocardiac portion of the chest cannot exclude pneumonia. Electronically Signed   By: Ashley Royalty M.D.   On: 01/12/2018 22:38    Procedures Procedures (including critical care time) CRITICAL CARE Performed by: Charlesetta Shanks   Total critical care time: 30 minutes  Critical care time was exclusive of separately billable procedures and treating other patients.  Critical care was necessary to treat or prevent imminent or life-threatening deterioration.  Critical care was time spent personally by me on the following activities: development of treatment plan with patient and/or surrogate as well as nursing, discussions with consultants, evaluation of patient's response to treatment, examination of patient, obtaining history from patient or surrogate, ordering and performing treatments and interventions, ordering and review of laboratory studies, ordering and review of radiographic studies, pulse oximetry and re-evaluation of patient's condition. Medications Ordered in ED Medications  furosemide (LASIX) injection 40 mg (has no administration in time range)  aspirin chewable tablet 324 mg (324 mg Oral Given 01/12/18 2202)  nitroGLYCERIN 50 mg in dextrose 5 % 250 mL (0.2 mg/mL) infusion (10 mcg/min Intravenous Rate/Dose Change 01/12/18 2250)  ondansetron (ZOFRAN) 4 MG/2ML injection (4 mg  Given 01/12/18 2215)     Initial Impression / Assessment and Plan / ED Course  I have reviewed the triage vital signs and the nursing notes.  Pertinent labs & imaging results that were available  during my care of the patient were reviewed by me and considered in my medical decision making (see chart for details).  Clinical Course as of Jan 18 1546  Sat Jan 12, 2018  2242 Reports shortness of breath has improved significantly.  General appearance is much better.  She is breathing comfortably on a nonrebreather mask now.   [MP]  2242 Consult: Reviewed with cardiology fellow.  Agrees EKG most consistent with pre-existing LVH with rate related ST elevation but not STEMI.  Patient does not have chest pain.  Counseled to continue treating for shortness of breath as  planned and reconsult if needed.   [MP]  2340 Consult hospitalist ordered   [MP]    Clinical Course User Index [MP] Charlesetta Shanks, MD  Patient presented with very acute onset of shortness of breath.  She does have history of COPD however also arrives severely hypertensive with chest x-ray suggestive of volume overload.  Findings consistent with hypertensive emergency.  Patient had brief.  On the BiPAP and improved with nitroglycerin drip and Lasix.  Ultimately she was able to transition to a nasal cannula.  Patient is admitted in much improved condition.   Final Clinical Impressions(s) / ED Diagnoses   Final diagnoses:  Hypertensive emergency  Acute on chronic diastolic congestive heart failure [facility])    ED Discharge Orders    None       Charlesetta Shanks, MD 01/18/18 [id number]

## 2018-01-12 NOTE — Progress Notes (Addendum)
PT placed on BIpap per MD order.

## 2018-01-12 NOTE — Progress Notes (Signed)
Pt taken off Bipap due to pt getting nauseous and felt like she was gong to vomit.  Pt placed on NRB.  Pt currently on CAT and Sp02 is 97%. Will continue to monitor.

## 2018-01-12 NOTE — ED Notes (Signed)
Pt started coughing and started to vomit into bipap. This RN pulled bipap off before vomiting. EDP notified, respiratory at bedside. See [facility]

## 2018-01-12 NOTE — ED Triage Notes (Signed)
Pt to [facility] ED TRIAGE pale, diaphoretic, increased WOB, Spo2 72% HR140; spouse at bed side said they were coming to visit someone when she began to feel bad

## 2018-01-12 NOTE — ED Notes (Signed)
Pt denies allergy to aspirin, states irritates her stomach

## 2018-01-13 ENCOUNTER — Encounter (HOSPITAL_COMMUNITY): Payer: Self-pay

## 2018-01-13 ENCOUNTER — Other Ambulatory Visit: Payer: Self-pay

## 2018-01-13 DIAGNOSIS — E890 Postprocedural hypothyroidism: Secondary | ICD-10-CM | POA: Diagnosis present

## 2018-01-13 DIAGNOSIS — I161 Hypertensive emergency: Secondary | ICD-10-CM

## 2018-01-13 DIAGNOSIS — J9601 Acute respiratory failure with hypoxia: Secondary | ICD-10-CM

## 2018-01-13 DIAGNOSIS — M11241 Other chondrocalcinosis, right hand: Secondary | ICD-10-CM | POA: Diagnosis present

## 2018-01-13 DIAGNOSIS — Z6832 Body mass index (BMI) 32.0-32.9, adult: Secondary | ICD-10-CM | POA: Diagnosis not present

## 2018-01-13 DIAGNOSIS — I420 Dilated cardiomyopathy: Secondary | ICD-10-CM | POA: Diagnosis present

## 2018-01-13 DIAGNOSIS — N171 Acute kidney failure with acute cortical necrosis: Secondary | ICD-10-CM | POA: Diagnosis not present

## 2018-01-13 DIAGNOSIS — I509 Heart failure, unspecified: Secondary | ICD-10-CM | POA: Diagnosis not present

## 2018-01-13 DIAGNOSIS — Z23 Encounter for immunization: Secondary | ICD-10-CM | POA: Diagnosis not present

## 2018-01-13 DIAGNOSIS — E782 Mixed hyperlipidemia: Secondary | ICD-10-CM | POA: Diagnosis present

## 2018-01-13 DIAGNOSIS — I169 Hypertensive crisis, unspecified: Secondary | ICD-10-CM | POA: Diagnosis present

## 2018-01-13 DIAGNOSIS — M10041 Idiopathic gout, right hand: Secondary | ICD-10-CM | POA: Diagnosis present

## 2018-01-13 DIAGNOSIS — Z7951 Long term (current) use of inhaled steroids: Secondary | ICD-10-CM | POA: Diagnosis not present

## 2018-01-13 DIAGNOSIS — N179 Acute kidney failure, unspecified: Secondary | ICD-10-CM | POA: Diagnosis not present

## 2018-01-13 DIAGNOSIS — I110 Hypertensive heart disease with heart failure: Secondary | ICD-10-CM | POA: Diagnosis present

## 2018-01-13 DIAGNOSIS — Z79890 Hormone replacement therapy (postmenopausal): Secondary | ICD-10-CM | POA: Diagnosis not present

## 2018-01-13 DIAGNOSIS — I5033 Acute on chronic diastolic (congestive) heart failure: Secondary | ICD-10-CM

## 2018-01-13 DIAGNOSIS — E669 Obesity, unspecified: Secondary | ICD-10-CM | POA: Diagnosis present

## 2018-01-13 DIAGNOSIS — D8685 Sarcoid myocarditis: Secondary | ICD-10-CM | POA: Diagnosis not present

## 2018-01-13 DIAGNOSIS — M19031 Primary osteoarthritis, right wrist: Secondary | ICD-10-CM | POA: Diagnosis not present

## 2018-01-13 DIAGNOSIS — J449 Chronic obstructive pulmonary disease, unspecified: Secondary | ICD-10-CM | POA: Diagnosis present

## 2018-01-13 DIAGNOSIS — R0602 Shortness of breath: Secondary | ICD-10-CM | POA: Diagnosis not present

## 2018-01-13 DIAGNOSIS — Z79899 Other long term (current) drug therapy: Secondary | ICD-10-CM | POA: Diagnosis not present

## 2018-01-13 DIAGNOSIS — I5043 Acute on chronic combined systolic (congestive) and diastolic (congestive) heart failure: Secondary | ICD-10-CM | POA: Diagnosis present

## 2018-01-13 LAB — TROPONIN I: Troponin I: 0.22 ng/mL (ref <0.03)

## 2018-01-13 LAB — CBC
HCT: 41.3 % (ref 36.0–46.0)
HCT: 40.3 % (ref 36.0–46.0)
HEMOGLOBIN: 12.8 g/dL (ref 12.0–15.0)
Hemoglobin: 12.3 g/dL (ref 12.0–15.0)
MCH: 27 pg (ref 26.0–34.0)
MCH: 26.9 pg (ref 26.0–34.0)
MCHC: 31 g/dL (ref 30.0–36.0)
MCHC: 30.5 g/dL (ref 30.0–36.0)
MCV: 87.1 fL (ref 80.0–100.0)
MCV: 88 fL (ref 80.0–100.0)
NRBC: 0 % (ref 0.0–0.2)
PLATELETS: 227 10*3/uL (ref 150–400)
Platelets: 227 10*3/uL (ref 150–400)
RBC: 4.74 MIL/uL (ref 3.87–5.11)
RBC: 4.58 MIL/uL (ref 3.87–5.11)
RDW: 14.9 % (ref 11.5–15.5)
RDW: 15 % (ref 11.5–15.5)
WBC: 11.9 10*3/uL — ABNORMAL HIGH (ref 4.0–10.5)
WBC: 12.3 10*3/uL — AB (ref 4.0–10.5)
nRBC: 0 % (ref 0.0–0.2)

## 2018-01-13 LAB — I-STAT CG4 LACTIC ACID, ED: Lactic Acid, Venous: 3.29 mmol/L (ref 0.5–1.9)

## 2018-01-13 LAB — BASIC METABOLIC PANEL
Anion gap: 13 (ref 5–15)
BUN: 18 mg/dL (ref 8–23)
CO2: 23 mmol/L (ref 22–32)
CREATININE: 1.73 mg/dL — AB (ref 0.44–1.00)
Calcium: 11 mg/dL — ABNORMAL HIGH (ref 8.9–10.3)
Chloride: 107 mmol/L (ref 98–111)
GFR calc Af Amer: 33 mL/min — ABNORMAL LOW (ref >60)
GFR, EST NON AFRICAN AMERICAN: 29 mL/min — AB (ref >60)
Glucose, Bld: 139 mg/dL — ABNORMAL HIGH (ref 70–99)
Potassium: 3.8 mmol/L (ref 3.5–5.1)
SODIUM: 143 mmol/L (ref 135–145)

## 2018-01-13 LAB — CREATININE, SERUM
CREATININE: 1.69 mg/dL — AB (ref 0.44–1.00)
GFR calc non Af Amer: 30 mL/min — ABNORMAL LOW (ref >60)
GFR, EST AFRICAN AMERICAN: 34 mL/min — AB (ref >60)

## 2018-01-13 LAB — TSH: TSH: 1.422 u[IU]/mL (ref 0.350–4.500)

## 2018-01-13 LAB — HIV ANTIBODY (ROUTINE TESTING W REFLEX): HIV SCREEN 4TH GENERATION: NONREACTIVE

## 2018-01-13 LAB — LACTIC ACID, PLASMA: LACTIC ACID, VENOUS: 1.7 mmol/L (ref 0.5–1.9)

## 2018-01-13 MED ORDER — ENOXAPARIN SODIUM 30 MG/0.3ML ~~LOC~~ SOLN
30.0000 mg | Freq: Every day | SUBCUTANEOUS | Status: DC
  Administered 2018-01-13 – 2018-01-16 (×4): 30 mg via SUBCUTANEOUS
  Filled 2018-01-13 (×4): qty 0.3

## 2018-01-13 MED ORDER — LEVOTHYROXINE SODIUM 88 MCG PO TABS
88.0000 ug | ORAL_TABLET | Freq: Every day | ORAL | Status: DC
  Administered 2018-01-13 – 2018-01-16 (×4): 88 ug via ORAL
  Filled 2018-01-13 (×4): qty 1

## 2018-01-13 MED ORDER — LATANOPROST 0.005 % OP SOLN
1.0000 [drp] | Freq: Every day | OPHTHALMIC | Status: DC
  Administered 2018-01-14 – 2018-01-15 (×2): 1 [drp] via OPHTHALMIC
  Filled 2018-01-13: qty 2.5

## 2018-01-13 MED ORDER — TIMOLOL MALEATE 0.25 % OP SOLN
1.0000 [drp] | Freq: Two times a day (BID) | OPHTHALMIC | Status: DC
  Administered 2018-01-14 – 2018-01-16 (×5): 1 [drp] via OPHTHALMIC
  Filled 2018-01-13: qty 5

## 2018-01-13 MED ORDER — HYDRALAZINE HCL 50 MG PO TABS
50.0000 mg | ORAL_TABLET | Freq: Three times a day (TID) | ORAL | Status: DC
  Administered 2018-01-13 – 2018-01-16 (×11): 50 mg via ORAL
  Filled 2018-01-13 (×6): qty 1
  Filled 2018-01-13: qty 2
  Filled 2018-01-13 (×3): qty 1
  Filled 2018-01-13: qty 2
  Filled 2018-01-13: qty 1

## 2018-01-13 MED ORDER — PRAVASTATIN SODIUM 40 MG PO TABS: 40.0000 mg | ORAL_TABLET | Freq: Every day | ORAL | Status: DC

## 2018-01-13 MED ORDER — FUROSEMIDE 40 MG PO TABS
40.0000 mg | ORAL_TABLET | Freq: Every day | ORAL | Status: DC
  Administered 2018-01-13 – 2018-01-14 (×2): 40 mg via ORAL
  Filled 2018-01-13 (×2): qty 1

## 2018-01-13 MED ORDER — FUROSEMIDE 10 MG/ML IJ SOLN
40.0000 mg | Freq: Once | INTRAMUSCULAR | Status: AC
  Administered 2018-01-13: 40 mg via INTRAVENOUS
  Filled 2018-01-13: qty 4

## 2018-01-13 MED ORDER — FAMOTIDINE 20 MG PO TABS
20.0000 mg | ORAL_TABLET | Freq: Every day | ORAL | Status: DC
  Administered 2018-01-13 – 2018-01-16 (×4): 20 mg via ORAL
  Filled 2018-01-13 (×4): qty 1

## 2018-01-13 MED ORDER — CYCLOSPORINE 0.05 % OP EMUL
1.0000 [drp] | Freq: Two times a day (BID) | OPHTHALMIC | Status: DC
  Administered 2018-01-14 – 2018-01-16 (×5): 1 [drp] via OPHTHALMIC
  Filled 2018-01-13 (×7): qty 1

## 2018-01-13 MED ORDER — IPRATROPIUM-ALBUTEROL 0.5-2.5 (3) MG/3ML IN SOLN
3.0000 mL | Freq: Three times a day (TID) | RESPIRATORY_TRACT | Status: DC
  Administered 2018-01-13 – 2018-01-16 (×10): 3 mL via RESPIRATORY_TRACT
  Filled 2018-01-13 (×11): qty 3

## 2018-01-13 MED ORDER — PRAVASTATIN SODIUM 20 MG PO TABS
20.0000 mg | ORAL_TABLET | Freq: Every day | ORAL | Status: DC
  Administered 2018-01-13 – 2018-01-15 (×3): 20 mg via ORAL
  Filled 2018-01-13 (×3): qty 1

## 2018-01-13 MED ORDER — CINACALCET HCL 30 MG PO TABS
30.0000 mg | ORAL_TABLET | ORAL | Status: DC
  Administered 2018-01-13: 30 mg via ORAL
  Filled 2018-01-13: qty 1

## 2018-01-13 MED ORDER — LOSARTAN POTASSIUM 50 MG PO TABS
100.0000 mg | ORAL_TABLET | Freq: Every day | ORAL | Status: DC
  Administered 2018-01-13: 100 mg via ORAL
  Filled 2018-01-13: qty 2

## 2018-01-13 MED ORDER — CARVEDILOL 3.125 MG PO TABS
3.1250 mg | ORAL_TABLET | Freq: Two times a day (BID) | ORAL | Status: DC
  Administered 2018-01-13 – 2018-01-16 (×7): 3.125 mg via ORAL
  Filled 2018-01-13 (×8): qty 1

## 2018-01-13 MED ORDER — ONDANSETRON HCL 4 MG/2ML IJ SOLN: 4.0000 mg | Freq: Three times a day (TID) | INTRAMUSCULAR | Status: DC | PRN

## 2018-01-13 MED ORDER — VITAMIN D 1000 UNITS PO TABS
3000.0000 [IU] | ORAL_TABLET | Freq: Every day | ORAL | Status: DC
  Administered 2018-01-13 – 2018-01-16 (×4): 3000 [IU] via ORAL
  Filled 2018-01-13 (×4): qty 3

## 2018-01-13 MED ORDER — MONTELUKAST SODIUM 10 MG PO TABS
10.0000 mg | ORAL_TABLET | Freq: Every day | ORAL | Status: DC
  Administered 2018-01-13 – 2018-01-16 (×4): 10 mg via ORAL
  Filled 2018-01-13 (×4): qty 1

## 2018-01-13 MED ORDER — CLONIDINE HCL 0.1 MG PO TABS
0.1000 mg | ORAL_TABLET | Freq: Two times a day (BID) | ORAL | Status: DC
  Administered 2018-01-13 – 2018-01-16 (×8): 0.1 mg via ORAL
  Filled 2018-01-13 (×8): qty 1

## 2018-01-13 MED ORDER — PNEUMOCOCCAL VAC POLYVALENT 25 MCG/0.5ML IJ INJ
0.5000 mL | INJECTION | INTRAMUSCULAR | Status: AC
  Administered 2018-01-14: 0.5 mL via INTRAMUSCULAR
  Filled 2018-01-13: qty 0.5

## 2018-01-13 MED ORDER — BUDESONIDE 0.25 MG/2ML IN SUSP
0.2500 mg | RESPIRATORY_TRACT | Status: DC
  Administered 2018-01-13 – 2018-01-15 (×3): 0.25 mg via RESPIRATORY_TRACT
  Filled 2018-01-13 (×4): qty 2

## 2018-01-13 MED ORDER — INFLUENZA VAC SPLIT HIGH-DOSE 0.5 ML IM SUSY
0.5000 mL | PREFILLED_SYRINGE | INTRAMUSCULAR | Status: AC
  Administered 2018-01-14: 0.5 mL via INTRAMUSCULAR
  Filled 2018-01-13: qty 0.5

## 2018-01-13 NOTE — H&P (Signed)
History and Physical  Stacey [name] JOA:[id number] DOB: [birth year] DOA: 01/12/2018 2142  Referring physician: Calla Kicks (ED) PCP: Ronnell Freshwater, NP  Outpatient Specialists: [name] and Lyndel Ple[facility] at [place], [place])  HISTORY   Chief Complaint: acute respiratory distress and hypoxia  HPI: Stacey [name] is a 72 y.o. female with HFrEF (EF 35% 2018) and NICM, hypothyroidism, COPD who presented with acute hypoxic respiratory failure and HTNsive crisis. Patient has been having non productive cough mostly at night for the past month and mild increase in dyspnea that she notices at night. Although she initially had mild transient sore throat about a month ago that resolved; otherwise no URI symptoms except for the nocturnal cough. She has been taking OTC cough medicines and decongestants frequently in the past few weeks. Sleeps on 2 pillows at night (unchanged). No lower extremity edema. No sick contacts. Denies fevers or chills. Compliant with PO lasix and reports good urine output. Reports compliance with low sodium diet.  Earlier today, patient was coming to [facility] hospital to visit a church friend and suddenly felt acutely short of breath. Over the next hour, she became diaphoretic, lightheaded and nauseous. Had emesis x 2. Initial SO2 was 72% and she was placed on BiPAP -- but patient had emesis so switched to NRB. BP elevated to 601-093A systolics. Nitro drip started. Patient reports compliance with her medications including coreg, clonidine, PO lasix 40mg  daily, losartan. Only recent change was addition of coreg 3.125mg  BID which was prescribed by her cardiologist [facility]) for [place].  Of note, patient recently got married 2 weeks ago.      Review of Systems:  + acute SOB and diaphoresis episode on day of admission + increasing dry cough  + episode of nausea/vomiting today - no fevers/chills - no chest pain, dyspnea on exertion - no  peripheral edema, +stable 2 pillow orthopnea; no orthopnea - no tarry, melanotic or bloody stools - no dysuria, increased urinary frequency - no weight changes  Rest of systems reviewed are negative, except as per above history.   ED course:  Vitals Blood pressure (!) 174/85, pulse 64, temperature (!) 95.7 F (35.4 C), temperature source Temporal, resp. rate (!) 22, SpO2 95 %. Initial SO2 was 72% and she was placed on BiPAP -- but patient had emesis so switched to NRB. BP elevated to 355-732K systolics. Nitro drip started. Received albuterol nebs; aspirin 324mg  x 1; IV lasix  40mg  x 1; zofran IV; and continued on nitro gtt.   Past Medical History:  Diagnosis Date  . Hypertension   . Thyroid disease    Past Surgical History:  Procedure Laterality Date  . GALLBLADDER SURGERY    . KNEE SURGERY    . thyroidectomy      Social History:  reports that she has never smoked. She has never used smokeless tobacco. She reports that she has current or past drug history. Drug: "Crack" cocaine. She reports that she does not drink alcohol.  Allergies  Allergen Reactions  . Codeine Other (See Comments)    Other reaction(s): GI Intolerance Other Reaction: nausea   chest pain Nausea   . Propoxyphene Other (See Comments)    Other Reaction: nausea and chest pain  . Alendronate     Other reaction(s): Other (See Comments) Chest pain  . Ciprofloxacin Rash  . Lisinopril     Other reaction(s): Cough  . Amlodipine Swelling  . Aspirin     Other reaction(s):  Other (See Comments)  . Tramadol Nausea And Vomiting  . Vicodin [Hydrocodone-Acetaminophen] Nausea And Vomiting    No family history on file.    Prior to Admission medications   Medication Sig Start Date End Date Taking? Authorizing Provider  carvedilol (COREG) 3.125 MG tablet Take 3.125 mg by mouth 2 (two) times daily with a meal.   Yes [provider]  albuterol (VENTOLIN HFA) 108 (90 Base) MCG/ACT inhaler Inhale 2 puffs into  the lungs every 6 (six) hours as needed for wheezing or shortness of breath. 05/22/17   [name], MD  cetirizine (ZYRTEC) 10 MG tablet Take 1 tablet (10 mg total) by mouth daily. 11/12/17   Ronnell Freshwater, NP  Cholecalciferol (VITAMIN D3) 1000 units CAPS Take by mouth. 05/21/09   [provider]  cinacalcet (SENSIPAR) 30 MG tablet Take one tablet by mouth three times per week. 03/15/16   [provider]  clobetasol ointment (TEMOVATE) 0.05 % APP AA BID PRF INFLAMMATORY DERMATITIS 02/06/16   [provider]  cloNIDine (CATAPRES) 0.1 MG tablet Take 1 tablet (0.1 mg total) by mouth 2 (two) times daily. 10/26/17   Ronnell Freshwater, NP  famotidine (PEPCID) 20 MG tablet Take 1 tablet (20 mg total) by mouth daily. 12/14/14 12/14/15  Victorino Dike, FNP  fluticasone (FLOVENT HFA) 110 MCG/ACT inhaler Take 2 puffs at bed time 05/22/17   [name], MD  furosemide (LASIX) 20 MG tablet Take 2 tablets (40 mg total) by mouth daily. 07/30/17   Ronnell Freshwater, NP  hydrALAZINE (APRESOLINE) 50 MG tablet Take 2 tab po tid 10/26/17   Ronnell Freshwater, NP  hydrOXYzine (ATARAX/VISTARIL) 25 MG tablet Take 1 tablet (25 mg total) by mouth 3 (three) times daily as needed. Patient not taking: Reported on 10/26/2017 12/14/14   Sherrie George B, FNP  latanoprost (XALATAN) 0.005 % ophthalmic solution Apply to eye. 01/16/14   [provider]  levothyroxine (SYNTHROID, LEVOTHROID) 88 MCG tablet TK 1 T PO D FOR HYPOTHYROID 03/11/17   [provider]  losartan (COZAAR) 100 MG tablet Take 1 tablet (100 mg total) by mouth daily. 09/07/17   Ronnell Freshwater, NP  montelukast (SINGULAIR) 10 MG tablet Take 1 tablet (10 mg total) by mouth daily. 12/26/17   Ronnell Freshwater, NP  Multiple Vitamins-Minerals (MULTIVITAMIN ADULT EXTRA C PO) Take by mouth. 07/30/09   [provider]  naproxen (NAPROSYN) 500 MG tablet Take 1 tablet (500 mg total) by mouth 2 (two) times daily with a meal.  09/24/14   Sable Feil, PA-C  ondansetron (ZOFRAN) 8 MG tablet Take by mouth every 8 (eight) hours as needed for nausea or vomiting.    [provider]  pantoprazole (PROTONIX) 40 MG tablet Take 1 tablet (40 mg total) by mouth as needed. 10/26/17   Ronnell Freshwater, NP  pravastatin (PRAVACHOL) 20 MG tablet TAKE 1 TABLET(20 MG) BY MOUTH EVERY NIGHT 11/20/17   Ronnell Freshwater, NP  timolol (TIMOPTIC) 0.25 % ophthalmic solution Apply to eye. 07/30/09   [provider]    PHYSICAL EXAM   Temp:  [95.7 F (35.4 C)] 95.7 F (35.4 C) (10/26 2153) Pulse Rate:  [49-140] 64 (10/27 0000) Resp:  [21-40] 22 (10/27 0000) BP: (113-242)/(78-159) 174/85 (10/27 0000) SpO2:  [72 %-98 %] 95 % (10/27 0000)  BP (!) 174/85   Pulse 64   Temp (!) 95.7 F (35.4 C) (Temporal)   Resp (!) 22   SpO2  95%    GEN obese elderly african-american female; resting in bed, able to speak full sentences  HEENT NCAT EOM intact PERRL; clear oropharynx, no cervical LAD; moist mucus membranes  JVP estimated 10 cm H2O above RA; + HJR ; no carotid bruits b/l ;  CV regular normal rate; normal S1 and S2; +7-9/8 systolic murmur at RUSB; intermittent +S3 gallop; PMI non palpable; no parasternal heave  RESP  Crackles throughout to upper lobes b/l and occasional end expiratory wheezing; diminished at bases b/l; +accessory mm use;  symmetric air movement ABD soft NT ND +normoactive BS  EXT warm throughout b/l; no peripheral edema b/l  PULSES  DP and radials 2+ intact b/l  SKIN/MSK no rashes or lesions  NEURO/PSYCH AAOx4; no focal deficits   DATA   LABS ON ADMISSION:  Basic Metabolic Panel: Recent Labs  Lab 01/12/18 2155  NA 142  K 4.0  CL 110  CO2 17*  GLUCOSE 273*  BUN 17  CREATININE 1.62*  CALCIUM 11.1*   CBC: Recent Labs  Lab 01/12/18 2155  WBC 10.2  NEUTROABS 4.5  HGB 14.6  HCT 48.7*  MCV 90.4  PLT 256   Liver Function Tests: Recent Labs  Lab 01/12/18 2225  AST 34  ALT 21  ALKPHOS  67  BILITOT 0.5  PROT 7.9  ALBUMIN 3.6   No results for input(s): LIPASE, AMYLASE in the last 168 hours. No results for input(s): AMMONIA in the last 168 hours. Coagulation:  No results found for: INR, PROTIME No results found for: PTT Lactic Acid, Venous:     Component Value Date/Time   LATICACIDVEN 3.29 (HH) 01/13/2018 0004   Cardiac Enzymes: Recent Labs  Lab 01/12/18 2155  TROPONINI 0.05*   Urinalysis: No results found for: COLORURINE, APPEARANCEUR, LABSPEC, PHURINE, GLUCOSEU, HGBUR, BILIRUBINUR, KETONESUR, PROTEINUR, UROBILINOGEN, NITRITE, LEUKOCYTESUR  BNP (last 3 results) No results for input(s): PROBNP in the last 8760 hours. CBG: No results for input(s): GLUCAP in the last 168 hours.  Radiological Exams on Admission: Dg Chest Portable 1 View  Result Date: 01/12/2018 CLINICAL DATA:  Respiratory distress and dyspnea. EXAM: PORTABLE CHEST 1 VIEW COMPARISON:  04/26/2017 CXR FINDINGS: Cardiomegaly with interstitial edema. Slightly more confluent retrocardiac opacities are noted with air bronchograms. Superimposed pneumonia would be difficult to entirely exclude. Mild aortic atherosclerosis at the arch. Lung apices are excluded on this AP portable semi upright view. IMPRESSION: Cardiomegaly with interstitial edema and mild aortic atherosclerosis. Air bronchograms in the retrocardiac portion of the chest cannot exclude pneumonia. Electronically Signed   By: Ashley Royalty M.D.   On: 01/12/2018 22:38    EKG: Independently reviewed. Sinus tachycardia, LAE, LVH with repolarization abnormalities in lateral limb and precordial leads   ASSESSMENT AND PLAN   Assessment: DIAMON REDDINGER is a 72 y.o. female with HFrEF (EF 35% 2018) and NICM, hypothyroidism, COPD who presented with acute hypoxic respiratory failure and HTNsive crisis. BP acutely elevated to systolics 921-194R and pt initially required BiPAP and NRB while in ED. Symptoms dramatically improved with nitro gtt. Lactate  elevated to 3.2 while in ED but expect lactate to improve by tomorrow AM given clinical improvement. Suspect flash pulmonary edema in setting of acute severe HTN although cannot rule out more subacute volume overload, given patient's description of increasing dry nocturnal cough over the past month which could be a PND symptom equivalent. HTN episode may have been exacerbated by recent use of OTC decongestants. May need higher PO dose of maintenance lasix and uptitration  of BP meds on discharge. Followed by cardiology at [facility].    Active Problems:   Acute respiratory failure with hypoxia (HCC)   Plan:   # Acute hypoxic respiratory failure -- likely component of flash pulmonary edema due to hypertensive urgency and more chronic fluid accumulation over 2-3 weeks from ADHF in setting of HFrEF > TTE at OSH 2018 EF 35% with G3DD. Hypoxia improved after nitro gtt and on George prior to transfer after initially requiring BiPAP --> NRB in ED  - continue nitro gtt with goal SBP < 160 by tomorrow  - repeat lactate in the morning - admit to stepdown unit - IV diuresis - standing duonebs  - see below HF plan  # Acute decompensated heart failure with known systolic HFrEF (EF 45%) and NICM  > likely 2/2 acute hypertensive crisis due to ?OTC cough medicine; dry weight 136 lbs; BNP 400s  - IV diuresis with 40 mg lasix ordered tomorrow - telemetry - repeat lactate and troponin in AM (expect troponemia given BPs) - goal net negative 2L per day - strict ins/outs; daily weights - fluid and dietary sodium restriction - on nitro gtt (wean off as home BP meds initiated) - resume losartan tomorrow - resume hydralazine at 50mg  TID (instead of BID) - holding ace-inhibitor in setting of active diuresis - b-blocker: resume low dose coreg 3.125mg  started by outpatient cardiology - resuming clonidine 0.1mg  BID tonight - TTE ordered since no recent TTE in our records  # HTNsive emergency  > BP in 240s/110s in  ED  - BP control with nitro gtt overnight  - titrate for BP goal 809 systolics by tomorrow AM  - resuming home BP meds as above and wean off nitro gtt as BP tolerate  - expect mild troponin elevation 2/2 demand (cardiology reviewed EKG in ED and thought ACS unlikely)  # Chronic COPD > wheezing likely due to pulm edema rather than typical COPD exacerbation; CXR suggestive of vol overload - duonebs TID standing - if not improving with diuresis will reassess for COPD flare or pulm infection  # AKI 2/2 HTNsive crisis and cardiorenal congestion > baseline Cr 1.0-1.1 per OSH records   - monitor Cr with AM labs  - diuresis and BP control as above  # Chronic hypothyroidism - resume synthroid 88 mcg  # Chronic mixed HLD - resume pravastatin (note pt not on aspirin due to aspirin GI intolerance)   DVT Prophylaxis: lovenox Code Status:  Full Code Family Communication: family at bedside  Disposition Plan: admit to stepdown unit   Patient contact: Extended Emergency Contact Information Primary Emergency Contact: Boone,Annie Address: [address]          [place] [address] [place] Phone: [phone] Relation: Sister Secondary Emergency Contact: Paulino Door Address: [address]          [place], [place] [address] [place] Phone: [phone] Relation: Son  Time spent: > 55 minutes  Colbert Ewing, MD [facility] Pager [phone]  If 7PM-7AM, please contact night-coverage [email or web address] Password [facility] 01/13/2018, 12:37 AM

## 2018-01-13 NOTE — ED Notes (Signed)
Admitting MD notified on pt.'s elevated Lactic acid result.

## 2018-01-13 NOTE — Progress Notes (Signed)
[place] TEAM 1 - Stepdown/ICU TEAM  Stacey [name]  JSE:[id number] DOB: 1945-10-05 DOA: 01/12/2018 PCP: Ronnell Freshwater, NP    Brief Narrative:  72 y.o. female with a hx of systolic CHF (EF 07% 3710), NICM, hypothyroidism, and COPD who presented with acute hypoxic respiratory failure and a HTNsive crisis. She reported a non productive cough and dyspnea mostly at night for a month for which she was taking OTC cold medications. She was coming to [facility] to visit a church friend and suddenly felt acutely short of breath. Over the next hour, she became diaphoretic, lightheaded and nauseous w/ emesis x 2. Initial SO2 was 72% and she was placed on BiPAP. BP was elevated to 626-948 systolic.   Significant Events: 10/27 admit   Subjective: Pt is seen for a f/u visit.    Assessment & Plan:  Acute hypoxic resp failure - flash pulmonary edema   Acute decompensated chronic systolic CHF EF 54% via TTE 2018  Hypertensive emergency BP now well controlled   Acute kidney injury crt 1.18 August 2017  Recent Labs  Lab 01/12/18 2155 01/13/18 0122 01/13/18 0424  CREATININE 1.62* 1.69* 1.73*    COPD  Hypothyroidism   HLD  DVT prophylaxis: lovenox  Code Status: FULL CODE Family Communication:  Disposition Plan: stable for tele bed   Consultants:  none  Antimicrobials:  none   Objective: Blood pressure 132/72, pulse 78, temperature 98.4 F (36.9 C), temperature source Oral, resp. rate 20, height 4\' 9"  (1.448 m), weight 67.4 kg, SpO2 94 %.  Intake/Output Summary (Last 24 hours) at 01/13/2018 1707 Last data filed at 01/13/2018 1437 Gross per 24 hour  Intake 51.55 ml  Output 600 ml  Net -548.45 ml   Filed Weights   01/13/18 1455  Weight: 67.4 kg    Examination: Pt was seen for a f/u visit.    CBC: Recent Labs  Lab 01/12/18 2155 01/13/18 0122 01/13/18 0424  WBC 10.2 11.9* 12.3*  NEUTROABS 4.5  --   --   HGB 14.6 12.8 12.3  HCT 48.7* 41.3 40.3  MCV 90.4  87.1 88.0  PLT 256 227 627   Basic Metabolic Panel: Recent Labs  Lab 01/12/18 2155 01/13/18 0122 01/13/18 0424  NA 142  --  143  K 4.0  --  3.8  CL 110  --  107  CO2 17*  --  23  GLUCOSE 273*  --  139*  BUN 17  --  18  CREATININE 1.62* 1.69* 1.73*  CALCIUM 11.1*  --  11.0*   GFR: Estimated Creatinine Clearance: 23.9 mL/min (A) (by C-G formula based on SCr of 1.73 mg/dL (H)).  Liver Function Tests: Recent Labs  Lab 01/12/18 2225  AST 34  ALT 21  ALKPHOS 67  BILITOT 0.5  PROT 7.9  ALBUMIN 3.6   Cardiac Enzymes: Recent Labs  Lab 01/12/18 2155 01/13/18 0424  TROPONINI 0.05* 0.22*     Scheduled Meds: . budesonide  0.25 mg Inhalation Q24H  . carvedilol  3.125 mg Oral BID WC  . cloNIDine  0.1 mg Oral BID  . enoxaparin (LOVENOX) injection  30 mg Subcutaneous Daily  . famotidine  20 mg Oral Daily  . hydrALAZINE  50 mg Oral Q8H  . [START ON 01/14/2018] Influenza vac split quadrivalent PF  0.5 mL Intramuscular Tomorrow-1000  . ipratropium-albuterol  3 mL Nebulization TID  . levothyroxine  88 mcg Oral Q0600  . losartan  100 mg Oral Daily  . montelukast  10 mg Oral Daily  . [START ON 01/14/2018] pneumococcal 23 valent vaccine  0.5 mL Intramuscular Tomorrow-1000  . pravastatin  40 mg Oral q1800     LOS: 0 days   Time spent: No Charge  Cherene Altes, MD [facility] Office  [phone] Pager - Text Page per Amion as per below:  On-Call/Text Page:      [email or web address]  If 7PM-7AM, please contact night-coverage [email or web address] 01/13/2018, 5:07 PM

## 2018-01-13 NOTE — ED Notes (Signed)
Delay in lab draw,  Pt using bedside commode. 

## 2018-01-13 NOTE — ED Notes (Signed)
Ordered lunch diet tray from [facility]

## 2018-01-14 ENCOUNTER — Ambulatory Visit (HOSPITAL_BASED_OUTPATIENT_CLINIC_OR_DEPARTMENT_OTHER): Payer: Medicare Other

## 2018-01-14 ENCOUNTER — Inpatient Hospital Stay (HOSPITAL_COMMUNITY): Payer: Medicare Other

## 2018-01-14 DIAGNOSIS — D8685 Sarcoid myocarditis: Secondary | ICD-10-CM

## 2018-01-14 DIAGNOSIS — I509 Heart failure, unspecified: Secondary | ICD-10-CM | POA: Diagnosis not present

## 2018-01-14 DIAGNOSIS — I169 Hypertensive crisis, unspecified: Secondary | ICD-10-CM

## 2018-01-14 DIAGNOSIS — N171 Acute kidney failure with acute cortical necrosis: Secondary | ICD-10-CM

## 2018-01-14 DIAGNOSIS — N179 Acute kidney failure, unspecified: Secondary | ICD-10-CM

## 2018-01-14 LAB — COMPREHENSIVE METABOLIC PANEL
ALT: 15 U/L (ref 0–44)
AST: 23 U/L (ref 15–41)
Albumin: 2.9 g/dL — ABNORMAL LOW (ref 3.5–5.0)
Alkaline Phosphatase: 47 U/L (ref 38–126)
Anion gap: 5 (ref 5–15)
BILIRUBIN TOTAL: 0.5 mg/dL (ref 0.3–1.2)
BUN: 22 mg/dL (ref 8–23)
CALCIUM: 10.9 mg/dL — AB (ref 8.9–10.3)
CO2: 27 mmol/L (ref 22–32)
Chloride: 107 mmol/L (ref 98–111)
Creatinine, Ser: 2.06 mg/dL — ABNORMAL HIGH (ref 0.44–1.00)
GFR, EST AFRICAN AMERICAN: 27 mL/min — AB (ref >60)
GFR, EST NON AFRICAN AMERICAN: 23 mL/min — AB (ref >60)
Glucose, Bld: 119 mg/dL — ABNORMAL HIGH (ref 70–99)
Potassium: 3.5 mmol/L (ref 3.5–5.1)
Sodium: 139 mmol/L (ref 135–145)
TOTAL PROTEIN: 6.3 g/dL — AB (ref 6.5–8.1)

## 2018-01-14 LAB — CBC
HEMATOCRIT: 38.2 % (ref 36.0–46.0)
Hemoglobin: 11.5 g/dL — ABNORMAL LOW (ref 12.0–15.0)
MCH: 26.2 pg (ref 26.0–34.0)
MCHC: 30.1 g/dL (ref 30.0–36.0)
MCV: 87 fL (ref 80.0–100.0)
NRBC: 0 % (ref 0.0–0.2)
Platelets: 189 10*3/uL (ref 150–400)
RBC: 4.39 MIL/uL (ref 3.87–5.11)
RDW: 15.1 % (ref 11.5–15.5)
WBC: 6.8 10*3/uL (ref 4.0–10.5)

## 2018-01-14 LAB — ECHOCARDIOGRAM COMPLETE
HEIGHTINCHES: 57 in
Weight: 2377.44 oz

## 2018-01-14 LAB — GLUCOSE, CAPILLARY: GLUCOSE-CAPILLARY: 127 mg/dL — AB (ref 70–99)

## 2018-01-14 MED ORDER — LACTATED RINGERS IV SOLN
INTRAVENOUS | Status: AC
  Administered 2018-01-14: 1000 mL via INTRAVENOUS

## 2018-01-14 MED ORDER — OXYCODONE HCL 5 MG PO TABS
5.0000 mg | ORAL_TABLET | Freq: Once | ORAL | Status: AC
  Administered 2018-01-14: 5 mg via ORAL
  Filled 2018-01-14: qty 1

## 2018-01-14 NOTE — Evaluation (Signed)
Physical Therapy Evaluation Patient Details Name: Stacey [name] MRN: [id number] DOB: 1946-01-14 Today's Date: 01/14/2018   History of Present Illness  72yo female c/o SOB, diaphoresis, light-headedness, and nausea. Found to be in acute hypoxic respiratory failure and hypertensive crisis in the ED. PMH HTN, knee surgery, thyroid disease   Clinical Impression   Patient received in chair, very pleasant and willing to work with skilled PT services. Able to complete all functional transfers and gait with no device and general S for safety, no physical assist given. Able to ambulate approximately 177f with S, no device but did require one standing rest break leaning on rail in hallway due to fatigue. SpO2 remained between 89-91% during gait on room air. She was left up in the chair with ECHO tech present, all other needs met this morning and SpO2 in chair 92% on room air, RN aware. She will continue to benefit from skilled PT services in the acute setting, otherwise recommend skilled OP PT services moving forward.     Follow Up Recommendations Outpatient PT    Equipment Recommendations  None recommended by PT    Recommendations for Other Services       Precautions / Restrictions Precautions Precautions: Other (comment) Precaution Comments: watch SPO2  Restrictions Weight Bearing Restrictions: No      Mobility  Bed Mobility               General bed mobility comments: OOB in chair   Transfers Overall transfer level: Needs assistance Equipment used: None Transfers: Sit to/from Stand Sit to Stand: Supervision         General transfer comment: distant S for safety, no physical assist given   Ambulation/Gait Ambulation/Gait assistance: Supervision Gait Distance (Feet): 150 Feet Assistive device: None Gait Pattern/deviations: Step-through pattern;Trunk flexed     General Gait Details: required 1 standing rest break due to fatigue/weakness; SpO2 89-91% on room air  during gait.   Stairs            Wheelchair Mobility    Modified Rankin (Stroke Patients Only)       Balance Overall balance assessment: Mild deficits observed, not formally tested                                           Pertinent Vitals/Pain Pain Assessment: 0-10 Pain Score: 3  Pain Location: R UE  Pain Descriptors / Indicators: Sore Pain Intervention(s): Limited activity within patient's tolerance;Monitored during session    Home Living Family/patient expects to be discharged to:: Private residence Living Arrangements: Spouse/significant other Available Help at Discharge: Family Type of Home: House Home Access: Stairs to enter Entrance Stairs-Rails: Can reach both Entrance Stairs-Number of Steps: 4 Home Layout: One level Home Equipment: Cane - single point Additional Comments: has SPC, only uses "twice a year" when her foot bothers her     Prior Function Level of Independence: Independent               Hand Dominance        Extremity/Trunk Assessment        Lower Extremity Assessment Lower Extremity Assessment: Generalized weakness    Cervical / Trunk Assessment Cervical / Trunk Assessment: Normal  Communication   Communication: No difficulties  Cognition Arousal/Alertness: Awake/alert Behavior During Therapy: WFL for tasks assessed/performed Overall Cognitive Status: Within Functional Limits for tasks assessed  General Comments      Exercises     Assessment/Plan    PT Assessment Patient needs continued PT services  PT Problem List Decreased strength;Decreased activity tolerance;Decreased balance       PT Treatment Interventions Balance training;Gait training;Neuromuscular re-education;Stair training;Functional mobility training;Patient/family education;Therapeutic activities;Therapeutic exercise    PT Goals (Current goals can be found in the Care Plan  section)  Acute Rehab PT Goals Patient Stated Goal: to go home, stay out of bed  PT Goal Formulation: With patient Time For Goal Achievement: 01/28/18 Potential to Achieve Goals: Good    Frequency Min 3X/week   Barriers to discharge        Co-evaluation               AM-PAC PT "6 Clicks" Daily Activity  Outcome Measure Difficulty turning over in bed (including adjusting bedclothes, sheets and blankets)?: None Difficulty moving from lying on back to sitting on the side of the bed? : None Difficulty sitting down on and standing up from a chair with arms (e.g., wheelchair, bedside commode, etc,.)?: None Help needed moving to and from a bed to chair (including a wheelchair)?: None Help needed walking in hospital room?: A Little Help needed climbing 3-5 steps with a railing? : A Little 6 Click Score: 22    End of Session   Activity Tolerance: Patient tolerated treatment well Patient left: in chair;with call bell/phone within reach;Other (comment)(ECHO staff present and attending ) Nurse Communication: Mobility status;Other (comment)(SpO2 during gait ) PT Visit Diagnosis: Muscle weakness (generalized) (M62.81)    Time: 1660-6301 PT Time Calculation (min) (ACUTE ONLY): 20 min   Charges:   PT Evaluation $PT Eval Low Complexity: 1 Low          Deniece Ree PT, DPT, CBIS  Supplemental Physical Therapist [place]    Pager [phone] Acute Rehab Office [phone]

## 2018-01-14 NOTE — Care Management Note (Signed)
Case Management Note  Patient Details  Name: Stacey [name] MRN: [id number] Date of Birth: [birth year]  Subjective/Objective:     Acute Resp Failure with Hypoxia              Action/Plan: Patient lives at home; PCP: Ronnell Freshwater, NP; has private insurance with Medicare / Christella Scheuermann with prescription drug coverage; CM following for progression of care.  Expected Discharge Date:    possibly 01/18/2018              Expected Discharge Plan:  Home/Self Care  Discharge planning Services  CM Consult   Status of Service:  In process, will continue to follow  Sherrilyn Rist [phone] 01/14/2018, 10:22 AM

## 2018-01-14 NOTE — Progress Notes (Signed)
I received a Stacey [name] to provide spiritual support for the patient. I visited the patient's room and provided spiritual support by listening with compassion as she talked about the lost of her best friend. Her friend died in surgery before she was able to see her. I shared words of encouragement and led in prayer. I shared that the Chaplain is available for additional support as needed or requested.     01/14/18 1200  Clinical Encounter Type  Visited With Patient  Visit Type Follow-up;Spiritual support  Referral From Nurse  Consult/Referral To Chaplain  Spiritual Encounters  Spiritual Needs Prayer;Emotional  Stress Factors  Patient Stress Factors Loss    Chaplain Dr Redgie Grayer

## 2018-01-14 NOTE — Progress Notes (Signed)
  Echocardiogram 2D Echocardiogram has been performed.  Stacey [name] 01/14/2018, 10:06 AM

## 2018-01-14 NOTE — Progress Notes (Signed)
PROGRESS NOTE                                                                                                                                                                                                             Patient Demographics:    Stacey [name], is a 72 y.o. female, DOB - [birth year], RCV:[id number]  Admit date - 01/12/2018   Admitting Physician Colbert Ewing, MD  Outpatient Primary MD for the patient is Ronnell Freshwater, NP  LOS - 1  Chief Complaint  Patient presents with  . Shortness of Breath  . Excessive Sweating  . Pallor       Brief Narrative  72 y.o.femalewitha hx of systolic CHF (EF 10% 2585), NICM, hypothyroidism, and COPD who presented with acute hypoxic respiratory failure and a HTNsive crisis. She reported a non productive cough and dyspnea mostly at night for a month for which she was taking OTC cold medications. She was coming to [facility] to visit a church friend and suddenly felt acutely short of breath. Over the next hour, she became diaphoretic, lightheaded and nauseous w/ emesis x 2. Initial SO2 was 72% and she was placed on BiPAP. BP was elevated to 277-824 systolic.    Subjective:    Stacey [name] today has, No headache, No chest pain, No abdominal pain - No Nausea, No new weakness tingling or numbness, No Cough - SOB.     Assessment  & Plan :     1.  Hypertensive crisis causing acute hypoxic respiratory failure due to acute on chronic combined systolic and diastolic CHF last EF 23%, requiring BiPAP upon admission.  Her blood pressure has been well controlled now, initially required IV nitro drip now off of it, has been diuresed and now down to room air.  Echo is pending.  Continue monitoring on blood pressure medications which are oral.  2.  ARF.  Likely due to combination of ARB and normalization of blood pressure, hold ARB gentle hydrate for 5 hours.  Check renal ultrasound and repeat BMP tomorrow.  3.  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  1 above, echo pending.  4. COPD.  No acute issues no wheezing.  On room air.  Supportive care as needed.  5.  Hypothyroidism.  On Synthroid continue.  6.  Dyslipidemia.  On statin continue.    Family Communication  :  None  Code Status :  Full  Disposition Plan  :  Med  Consults  :  None  Procedures  :    Echo  Renal US  DVT Prophylaxis  :  Lovenox   Lab Results  Component Value Date   PLT 189 01/14/2018    Diet :  Diet Order            Diet 2 gram sodium Room service appropriate? Yes; Fluid consistency: Thin; Fluid restriction: 1800 mL Fluid  Diet effective now               Inpatient Medications Scheduled Meds: . budesonide  0.25 mg Inhalation Q24H  . carvedilol  3.125 mg Oral BID WC  . cholecalciferol  3,000 Units Oral Daily  . cinacalcet  30 mg Oral Weekly  . cloNIDine  0.1 mg Oral BID  . cycloSPORINE  1 drop Both Eyes BID  . enoxaparin (LOVENOX) injection  30 mg Subcutaneous Daily  . famotidine  20 mg Oral Daily  . hydrALAZINE  50 mg Oral Q8H  . ipratropium-albuterol  3 mL Nebulization TID  . latanoprost  1 drop Both Eyes QHS  . levothyroxine  88 mcg Oral Q0600  . montelukast  10 mg Oral Daily  . pravastatin  20 mg Oral QHS  . timolol  1 drop Both Eyes BID   Continuous Infusions: . lactated ringers     PRN Meds:.ondansetron (ZOFRAN) IV  Antibiotics  :   Anti-infectives (From admission, onward)   None          Objective:   Vitals:   01/13/18 2150 01/14/18 0500 01/14/18 0524 01/14/18 0832  BP: 137/76  127/76   Pulse: 63  (!) 59 72  Resp:    19  Temp: 98.6 F (37 C)  98.2 F (36.8 C)   TempSrc:      SpO2: 97%  100% 99%  Weight:  67.4 kg    Height:        Wt Readings from Last 3 Encounters:  01/14/18 67.4 kg  10/26/17 61.7 kg  05/22/17 63 kg     Intake/Output Summary (Last 24 hours) at 01/14/2018 1112 Last data filed at 01/14/2018  0947 Gross per 24 hour  Intake 171.55 ml  Output -  Net 171.55 ml     Physical Exam  Awake Alert, Oriented X 3, No new F.N deficits, Normal affect [place].AT,PERRAL Supple Neck,No JVD, No cervical lymphadenopathy appriciated.  Symmetrical Chest wall movement, Good air movement bilaterally, few rales RRR,No Gallops,Rubs or new Murmurs, No Parasternal Heave +ve B.Sounds, Abd Soft, No tenderness, No organomegaly appriciated, No rebound - guarding or rigidity. No Cyanosis, Clubbing or edema, No new Rash or bruise       Data Review:    CBC Recent Labs  Lab 01/12/18 2155 01/13/18 0122 01/13/18 0424 01/14/18 0507  WBC 10.2 11.9* 12.3* 6.8  HGB 14.6 12.8 12.3 11.5*  HCT 48.7* 41.3 40.3 38.2  PLT 256 227 227 189  MCV 90.4 87.1 88.0 87.0  MCH 27.1 27.0 26.9 26.2  MCHC 30.0 31.0 30.5 30.1  RDW 14.9 14.9 15.0 15.1  LYMPHSABS 4.8*  --   --   --   MONOABS 0.8  --   --   --   EOSABS 0.1  --   --   --   BASOSABS 0.1  --   --   --     Chemistries  Recent Labs  Lab 01/12/18 2155 01/12/18 2225 01/13/18 0122 01/13/18 0424 01/14/18 0507  NA 142  --   --  143 139  K 4.0  --   --  3.8 3.5  CL 110  --   --  107 107  CO2 17*  --   --  23 27  GLUCOSE 273*  --   --  139* 119*  BUN 17  --   --  18 22  CREATININE 1.62*  --  1.69* 1.73* 2.06*  CALCIUM 11.1*  --   --  11.0* 10.9*  AST  --  34  --   --  23  ALT  --  21  --   --  15  ALKPHOS  --  67  --   --  47  BILITOT  --  0.5  --   --  0.5   ------------------------------------------------------------------------------------------------------------------ No results for input(s): CHOL, HDL, LDLCALC, TRIG, CHOLHDL, LDLDIRECT in the last 72 hours.  No results found for: HGBA1C ------------------------------------------------------------------------------------------------------------------ Recent Labs    01/13/18 0122  TSH 1.422    ------------------------------------------------------------------------------------------------------------------ No results for input(s): VITAMINB12, FOLATE, FERRITIN, TIBC, IRON, RETICCTPCT in the last 72 hours.  Coagulation profile No results for input(s): INR, PROTIME in the last 168 hours.  No results for input(s): DDIMER in the last 72 hours.  Cardiac Enzymes Recent Labs  Lab 01/12/18 2155 01/13/18 0424  TROPONINI 0.05* 0.22*   ------------------------------------------------------------------------------------------------------------------    Component Value Date/Time   BNP 490.3 (H) 01/12/2018 2155    Micro Results No results found for this or any previous visit (from the past 240 hour(s)).  Radiology Reports Dg Chest Portable 1 View  Result Date: 01/12/2018 CLINICAL DATA:  Respiratory distress and dyspnea. EXAM: PORTABLE CHEST 1 VIEW COMPARISON:  04/26/2017 CXR FINDINGS: Cardiomegaly with interstitial edema. Slightly more confluent retrocardiac opacities are noted with air bronchograms. Superimposed pneumonia would be difficult to entirely exclude. Mild aortic atherosclerosis at the arch. Lung apices are excluded on this AP portable semi upright view. IMPRESSION: Cardiomegaly with interstitial edema and mild aortic atherosclerosis. Air bronchograms in the retrocardiac portion of the chest cannot exclude pneumonia. Electronically Signed   By: Ashley Royalty M.D.   On: 01/12/2018 22:38     Time Spent in minutes  30   Lala Lund M.D on 01/14/2018 at 11:12 AM  To page go to [email or web address] - password [email or web address]

## 2018-01-15 ENCOUNTER — Inpatient Hospital Stay (HOSPITAL_COMMUNITY): Payer: Medicare Other

## 2018-01-15 LAB — BASIC METABOLIC PANEL
Anion gap: 7 (ref 5–15)
BUN: 23 mg/dL (ref 8–23)
CHLORIDE: 104 mmol/L (ref 98–111)
CO2: 27 mmol/L (ref 22–32)
CREATININE: 1.86 mg/dL — AB (ref 0.44–1.00)
Calcium: 10.9 mg/dL — ABNORMAL HIGH (ref 8.9–10.3)
GFR calc non Af Amer: 26 mL/min — ABNORMAL LOW (ref >60)
GFR, EST AFRICAN AMERICAN: 31 mL/min — AB (ref >60)
Glucose, Bld: 116 mg/dL — ABNORMAL HIGH (ref 70–99)
POTASSIUM: 3.7 mmol/L (ref 3.5–5.1)
SODIUM: 138 mmol/L (ref 135–145)

## 2018-01-15 LAB — URIC ACID: URIC ACID, SERUM: 7.8 mg/dL — AB (ref 2.5–7.1)

## 2018-01-15 MED ORDER — METHYLPREDNISOLONE SODIUM SUCC 125 MG IJ SOLR
60.0000 mg | Freq: Once | INTRAMUSCULAR | Status: AC
  Administered 2018-01-15: 60 mg via INTRAVENOUS
  Filled 2018-01-15: qty 2

## 2018-01-15 MED ORDER — COLCHICINE 0.6 MG PO TABS
0.6000 mg | ORAL_TABLET | Freq: Two times a day (BID) | ORAL | Status: AC
  Administered 2018-01-15 – 2018-01-16 (×3): 0.6 mg via ORAL
  Filled 2018-01-15 (×3): qty 1

## 2018-01-15 MED ORDER — LACTATED RINGERS IV SOLN
INTRAVENOUS | Status: AC
  Administered 2018-01-15: 1000 mL via INTRAVENOUS

## 2018-01-15 NOTE — Progress Notes (Signed)
Orthopedic Tech Progress Note Patient Details:  Stacey [name] 1945/08/16 [id number]  Ortho Devices Ortho Device/Splint Interventions: Application   Post Interventions Patient Tolerated: Well Instructions Provided: Care of device   [name] 01/15/2018, 3:56 PM

## 2018-01-15 NOTE — Progress Notes (Signed)
PROGRESS NOTE                                                                                                                                                                                                             Patient Demographics:    Stacey [name], is a 72 y.o. female, DOB - 10-12-1945, KGY:[id number]  Admit date - 01/12/2018   Admitting Physician Colbert Ewing, MD  Outpatient Primary MD for the patient is Ronnell Freshwater, NP  LOS - 2  Chief Complaint  Patient presents with  . Shortness of Breath  . Excessive Sweating  . Pallor       Brief Narrative  72 y.o.femalewitha hx of systolic CHF (EF 02% 6378), NICM, hypothyroidism, and COPD who presented with acute hypoxic respiratory failure and a HTNsive crisis. She reported a non productive cough and dyspnea mostly at night for a month for which she was taking OTC cold medications. She was coming to [facility] to visit a church friend and suddenly felt acutely short of breath. Over the next hour, she became diaphoretic, lightheaded and nauseous w/ emesis x 2. Initial SO2 was 72% and she was placed on BiPAP. BP was elevated to 588-502 systolic.    Subjective:   Patient in bed, appears comfortable, denies any headache, no fever, no chest pain or pressure, no shortness of breath , no abdominal pain. No focal weakness.  Does have some right hand and wrist pain ongoing for the last 24 to 36 hours.   Assessment  & Plan :     1.  Hypertensive crisis causing acute hypoxic respiratory failure due to acute on chronic combined systolic and diastolic CHF last EF now improved from 35% to 55%, requiring BiPAP upon admission.  Her blood pressure has been well controlled now, initially required IV nitro drip now off of it, has been diuresed and now down to room air.  Echo is noted with improved EF compared to previously known EF.  Continue monitoring on blood pressure medications which are oral.  2.  ARF.  Likely due to  combination of ARB and normalization of blood pressure, hold ARB gentle hydrate again with IV fluids for another 5 hours today him a stable renal ultrasound repeat BMP in the morning.   3.  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  1 above, echo pending.  4. COPD.  No acute issues no wheezing.  On room air.  Supportive care as needed.  5.  Hypothyroidism.  On Synthroid continue.  6.  Dyslipidemia.  On statin continue.  7. R Wrist/Hand pain - acute gout along with pseudogout, high Uric acid and evidence of chondrocalcinosis on x-ray, trial of IV steroids and colchicine and monitor.  X-ray stable.    Family Communication  :  None  Code Status :  Full  Disposition Plan  :  Med  Consults  :  None  Procedures  :    Echo  - Left ventricle: The cavity size was normal. Wall thickness was normal. Systolic function was normal. The estimated ejection  fraction was in the range of 50% to 55%. Doppler parameters are consistent with abnormal left ventricular relaxation (grade 1 diastolic dysfunction). Doppler parameters are consistent with high ventricular filling pressure. - Left atrium: The atrium was mildly dilated. - Pericardium, extracardiac: A small pericardial effusion was  identified.  Renal US - Somewhat atrophic kidneys with increased echotexture of the right kidney. This likely reflects chronic medical renal disease. No hydronephrosis. Enlarged fibroid uterus.   DVT Prophylaxis  :  Lovenox   Lab Results  Component Value Date   PLT 189 01/14/2018    Diet :  Diet Order            Diet 2 gram sodium Room service appropriate? Yes; Fluid consistency: Thin; Fluid restriction: 1800 mL Fluid  Diet effective now               Inpatient Medications Scheduled Meds: . budesonide  0.25 mg Inhalation Q24H  . carvedilol  3.125 mg Oral BID WC  . cholecalciferol  3,000 Units Oral Daily  . cinacalcet  30 mg  Oral Weekly  . cloNIDine  0.1 mg Oral BID  . colchicine  0.6 mg Oral BID WC  . cycloSPORINE  1 drop Both Eyes BID  . enoxaparin (LOVENOX) injection  30 mg Subcutaneous Daily  . famotidine  20 mg Oral Daily  . hydrALAZINE  50 mg Oral Q8H  . ipratropium-albuterol  3 mL Nebulization TID  . latanoprost  1 drop Both Eyes QHS  . levothyroxine  88 mcg Oral Q0600  . methylPREDNISolone (SOLU-MEDROL) injection  60 mg Intravenous Once  . montelukast  10 mg Oral Daily  . pravastatin  20 mg Oral QHS  . timolol  1 drop Both Eyes BID   Continuous Infusions:  PRN Meds:.ondansetron (ZOFRAN) IV  Antibiotics  :   Anti-infectives (From admission, onward)   None        Objective:   Vitals:   01/14/18 2355 01/14/18 2357 01/15/18 0441 01/15/18 0801  BP: 140/68  (!) 143/82   Pulse: 81 79 63 75  Resp: 18  18 18   Temp: 98.8 F (37.1 C)  98.3 F (36.8 C)   TempSrc: Oral  Oral   SpO2: (!) 89% 96% 95% 94%  Weight:   62.8 kg   Height:        Wt Readings from Last 3 Encounters:  01/15/18 62.8 kg  10/26/17 61.7 kg  05/22/17 63 kg     Intake/Output Summary (Last 24 hours) at 01/15/2018 1113 Last data filed at 01/15/2018 1028 Gross per 24 hour  Intake 1318.48 ml  Output 550 ml  Net 768.48 ml     Physical Exam  Awake Alert, Oriented X 3, No new F.N deficits, Normal affect [place].AT,PERRAL Supple Neck,No JVD, No cervical lymphadenopathy appriciated.  Symmetrical Chest wall movement, Good air movement bilaterally, CTAB RRR,No Gallops, Rubs  or new Murmurs, No Parasternal Heave +ve B.Sounds, Abd Soft, No tenderness, No organomegaly appriciated, No rebound - guarding or rigidity. No Cyanosis, Clubbing or edema, No new Rash or bruise, right hand and wrist slightly tender to palpate, no effusion or warmth.    Data Review:    CBC Recent Labs  Lab 01/12/18 2155 01/13/18 0122 01/13/18 0424 01/14/18 0507  WBC 10.2 11.9* 12.3* 6.8  HGB 14.6 12.8 12.3 11.5*  HCT 48.7* 41.3 40.3 38.2  PLT  256 227 227 189  MCV 90.4 87.1 88.0 87.0  MCH 27.1 27.0 26.9 26.2  MCHC 30.0 31.0 30.5 30.1  RDW 14.9 14.9 15.0 15.1  LYMPHSABS 4.8*  --   --   --   MONOABS 0.8  --   --   --   EOSABS 0.1  --   --   --   BASOSABS 0.1  --   --   --     Chemistries  Recent Labs  Lab 01/12/18 2155 01/12/18 2225 01/13/18 0122 01/13/18 0424 01/14/18 0507 01/15/18 0531  NA 142  --   --  143 139 138  K 4.0  --   --  3.8 3.5 3.7  CL 110  --   --  107 107 104  CO2 17*  --   --  23 27 27   GLUCOSE 273*  --   --  139* 119* 116*  BUN 17  --   --  18 22 23   CREATININE 1.62*  --  1.69* 1.73* 2.06* 1.86*  CALCIUM 11.1*  --   --  11.0* 10.9* 10.9*  AST  --  34  --   --  23  --   ALT  --  21  --   --  15  --   ALKPHOS  --  67  --   --  47  --   BILITOT  --  0.5  --   --  0.5  --    ------------------------------------------------------------------------------------------------------------------ No results for input(s): CHOL, HDL, LDLCALC, TRIG, CHOLHDL, LDLDIRECT in the last 72 hours.  No results found for: HGBA1C ------------------------------------------------------------------------------------------------------------------ Recent Labs    01/13/18 0122  TSH 1.422   ------------------------------------------------------------------------------------------------------------------ No results for input(s): VITAMINB12, FOLATE, FERRITIN, TIBC, IRON, RETICCTPCT in the last 72 hours.  Coagulation profile No results for input(s): INR, PROTIME in the last 168 hours.  No results for input(s): DDIMER in the last 72 hours.  Cardiac Enzymes Recent Labs  Lab 01/12/18 2155 01/13/18 0424  TROPONINI 0.05* 0.22*   ------------------------------------------------------------------------------------------------------------------    Component Value Date/Time   BNP 490.3 (H) 01/12/2018 2155    Micro Results Recent Results (from the past 240 hour(s))  Culture, blood (routine x 2)     Status: None  (Preliminary result)   Collection Time: 01/12/18 11:58 PM  Result Value Ref Range Status   Specimen Description BLOOD RIGHT ARM  Final   Special Requests   Final    BOTTLES DRAWN AEROBIC AND ANAEROBIC Blood Culture adequate volume   Culture   Final    NO GROWTH 1 DAY Performed at [facility], [place] [address] [place], [place] [address]    Report Status PENDING  Incomplete  Culture, blood (routine x 2)     Status: None (Preliminary result)   Collection Time: 01/13/18  1:20 AM  Result Value Ref Range Status   Specimen Description BLOOD RIGHT ARM  Final   Special Requests   Final    BOTTLES DRAWN  AEROBIC AND ANAEROBIC Blood Culture adequate volume   Culture   Final    NO GROWTH 1 DAY Performed at [facility], [place] [address] [place], [place] [address]    Report Status PENDING  Incomplete    Radiology Reports Dg Chest Portable 1 View  Result Date: 01/12/2018 CLINICAL DATA:  Respiratory distress and dyspnea. EXAM: PORTABLE CHEST 1 VIEW COMPARISON:  04/26/2017 CXR FINDINGS: Cardiomegaly with interstitial edema. Slightly more confluent retrocardiac opacities are noted with air bronchograms. Superimposed pneumonia would be difficult to entirely exclude. Mild aortic atherosclerosis at the arch. Lung apices are excluded on this AP portable semi upright view. IMPRESSION: Cardiomegaly with interstitial edema and mild aortic atherosclerosis. Air bronchograms in the retrocardiac portion of the chest cannot exclude pneumonia. Electronically Signed   By: Ashley Royalty M.D.   On: 01/12/2018 22:38     Time Spent in minutes  30   Lala Lund M.D on 01/15/2018 at 11:13 AM  To page go to [email or web address] - password [email or web address]

## 2018-01-15 NOTE — Progress Notes (Signed)
Physical Therapy Treatment Patient Details Name: Stacey [name] MRN: [id number] DOB: 1946/02/25 Today's Date: 01/15/2018    History of Present Illness 72yo female c/o SOB, diaphoresis, light-headedness, and nausea. Found to be in acute hypoxic respiratory failure and hypertensive crisis in the ED. PMH HTN, knee surgery, thyroid disease     PT Comments    Patient received in chair, pleasant and willing to participate in PT session but reporting high levels of pain R UE, increased from yesterday; per chart review, patient actively on blood thinners and scheduled for imaging study today. She continues to be able to complete functional transfers with S and gait approximately 125f today with no device and min guard, gait distance cut short by arrival of transport tech to take patient for imaging study on R UE. Introduced stairs with L railing, able to ascend with close S but required close min guard on descent due to knee pain and unsteadiness. SpO2 remained in 90s on room air during activity. She was left in transport chair with tech preparing to take patient to imaging, all needs otherwise met this morning.     Follow Up Recommendations  Outpatient PT     Equipment Recommendations  None recommended by PT    Recommendations for Other Services       Precautions / Restrictions Precautions Precautions: Other (comment) Precaution Comments: watch SPO2, painful R UE  Restrictions Weight Bearing Restrictions: No    Mobility  Bed Mobility               General bed mobility comments: OOB in chair   Transfers Overall transfer level: Needs assistance Equipment used: None Transfers: Sit to/from Stand Sit to Stand: Supervision         General transfer comment: distant S for safety, no physical assist given   Ambulation/Gait Ambulation/Gait assistance: Supervision Gait Distance (Feet): 125 Feet Assistive device: None Gait Pattern/deviations: Step-through pattern;Trunk  flexed Gait velocity: decreased    General Gait Details: spO2 in 90s on room air throughout gait, very guarded with R UE due to pain; gait cut short due to transportation to x-ray    Stairs Stairs: Yes Stairs assistance: Min guard Stair Management: One rail Left;Step to pattern Number of Stairs: 3 General stair comments: use of L railing due to pain in R UE; limited in stair navigation due to knee pain and required close min guard especially on descent    Wheelchair Mobility    Modified Rankin (Stroke Patients Only)       Balance Overall balance assessment: Mild deficits observed, not formally tested                                          Cognition Arousal/Alertness: Awake/alert Behavior During Therapy: WFL for tasks assessed/performed Overall Cognitive Status: Within Functional Limits for tasks assessed                                        Exercises      General Comments        Pertinent Vitals/Pain Pain Assessment: No/denies pain Pain Score: 10-Worst pain ever Pain Location: R UE  Pain Descriptors / Indicators: Sore;Aching Pain Intervention(s): Limited activity within patient's tolerance;Monitored during session;Ice applied    Home Living  Prior Function            PT Goals (current goals can now be found in the care plan section) Acute Rehab PT Goals Patient Stated Goal: to go home, stay out of bed  PT Goal Formulation: With patient Time For Goal Achievement: 01/28/18 Potential to Achieve Goals: Good Progress towards PT goals: Progressing toward goals    Frequency    Min 3X/week      PT Plan Current plan remains appropriate    Co-evaluation              AM-PAC PT "6 Clicks" Daily Activity  Outcome Measure                   End of Session   Activity Tolerance: Patient tolerated treatment well Patient left: Other (comment)(in transport chair with tech taking  patient to imaging study )   PT Visit Diagnosis: Muscle weakness (generalized) (M62.81)     Time: 5974-1638 PT Time Calculation (min) (ACUTE ONLY): 23 min  Charges:  $Gait Training: 23-37 mins                     Stacey [name] PT, DPT, CBIS  Supplemental Physical Therapist Stacey [name]    Pager [phone] Acute Rehab Office [phone]

## 2018-01-16 LAB — BASIC METABOLIC PANEL
Anion gap: 6 (ref 5–15)
BUN: 19 mg/dL (ref 8–23)
CHLORIDE: 104 mmol/L (ref 98–111)
CO2: 26 mmol/L (ref 22–32)
CREATININE: 1.59 mg/dL — AB (ref 0.44–1.00)
Calcium: 11.1 mg/dL — ABNORMAL HIGH (ref 8.9–10.3)
GFR calc Af Amer: 37 mL/min — ABNORMAL LOW (ref >60)
GFR calc non Af Amer: 32 mL/min — ABNORMAL LOW (ref >60)
GLUCOSE: 194 mg/dL — AB (ref 70–99)
POTASSIUM: 4.3 mmol/L (ref 3.5–5.1)
SODIUM: 136 mmol/L (ref 135–145)

## 2018-01-16 MED ORDER — CARVEDILOL 6.25 MG PO TABS: 6.2500 mg | ORAL_TABLET | Freq: Two times a day (BID) | ORAL | 0 refills | Status: DC

## 2018-01-16 MED ORDER — COLCHICINE 0.6 MG PO TABS: 0.6000 mg | ORAL_TABLET | Freq: Every day | ORAL | 0 refills | Status: DC

## 2018-01-16 MED ORDER — BISACODYL 10 MG RE SUPP
10.0000 mg | Freq: Once | RECTAL | Status: AC
  Administered 2018-01-16: 10 mg via RECTAL
  Filled 2018-01-16: qty 1

## 2018-01-16 MED ORDER — HYDRALAZINE HCL 50 MG PO TABS: 100.0000 mg | ORAL_TABLET | Freq: Three times a day (TID) | ORAL | 0 refills | Status: DC

## 2018-01-16 MED ORDER — METHYLPREDNISOLONE SODIUM SUCC 125 MG IJ SOLR
60.0000 mg | Freq: Once | INTRAMUSCULAR | Status: AC
  Administered 2018-01-16: 60 mg via INTRAVENOUS
  Filled 2018-01-16: qty 2

## 2018-01-16 MED ORDER — METHYLPREDNISOLONE 4 MG PO TBPK: ORAL_TABLET | ORAL | 0 refills | Status: DC

## 2018-01-16 NOTE — Consult Note (Signed)
     [facility] CM Primary Care Navigator  01/16/2018  Stacey [name] 1945-08-08 [id number]   Met withpatientand sister Stacey [name]) at the bedsideto identify possible discharge needs.  Patientreportshaving "shortness/ difficulty breathing, persistent coughing, nausea/ vomiting and elevated blood pressure on the 200's which all led to this admission. (hypertensive crisis causing acute hypoxic respiratory failure due to acute on chronic combined systolic and diastolic CHF).   PatientendorsesHeather Junius Creamer, NP with [facility] as her primary care provider.   [facility]. Patient states she used to work at [employer] on [address].  Patient reports that she has been managing her own medications at home straight out of the containers.  Patientverbalized that she has been driving prior to admission but her husband Stacey [name]), friend Stacey [name]) or her sister will be able to provide transportation toher doctors' appointments after discharge.  Patient mentioned that she was just married 2 weeks ago and her husband will be her primary caregiver at home.   Anticipated discharge plan is home with home health services per therapy recommendation.  Patientvoiced understanding to call primary care provider's office whenshereturns home for a post discharge follow-up appointment within1- 2 weeksor sooner if needs arise.Patient letter (with PCP's contact number) was provided asareminder.  Patient is a 72 y.o.femalewithhistory of systolic congestive HF(EF 35% 2018),sarcoidosis with non-ischemic cardiomyopathy, hypothyroidism andCOPD.  She was admitted for acute hypoxic respiratory failure, acute decompensated chronic systolic congestive HF, and hypertensive crisis.   She reports having a weighing scale at home (which may need re-calibration) and admits not regularly monitoring weight nor  recording. Patient reportshaving a blood pressure cuff to check blood pressures but would need to regularly check blood pressure and record. Patient also mentioned that she had been trying to do good in following diet, need to go back doing exercises (dancing). She acknowledges the need for education, reminders and reinforcements with managing chronic health conditions since she does not quite recall. Patienthas minimal awareness ofthesigns and symptoms of HF/ COPDthatwill need medical assistance. She isnotaware of COPD action plan and not fully aware of COPD andHF zones/ tool and ways to manage chronic health conditions.  Explained to patient aboutTHN care management services andresources available forher and shehad expressed interest for it.   Patient hadverbally agreed and [facility]coord[facility] for assessment/ follow-up of home needsin managing congestiveHF, HTN, COPD; reinforce adherence to treatment interventions (i.e.: diet, fluid restriction, medications, weighing daily/ blood pressure check daily and record)andfurther management of HF, HTN and other chronic health conditions at home.  Referral made to [facility] care coordinator to assess home needs, follow-up adherence, gainfurther education, reminders and reinforcementin treating/ managing chronic health conditionsafter discharge.  THNcare managementinformation provided for future needs thatshe may have.   For additional questions please contact:  Edwena Felty A. Thom Ollinger, BSN, RN-BC [facility] PRIMARY CARE Navigator Cell: [phone]

## 2018-01-16 NOTE — Discharge Instructions (Signed)
Follow with Primary MD Ronnell Freshwater, NP in 7 days   Get CBC, CMP, Uric Acid checked  by Primary MD in 5-7 days   Activity: As tolerated with Full fall precautions use walker/cane & assistance as needed  Disposition Home   Diet: Heart Healthy with 1.5 L/day total fluid restriction   Special Instructions: If you have smoked or chewed Tobacco  in the last 2 yrs please stop smoking, stop any regular Alcohol  and or any Recreational drug use.  On your next visit with your primary care physician please Get Medicines reviewed and adjusted.  Please request your Prim.MD to go over all Hospital Tests and Procedure/Radiological results at the follow up, please get all Hospital records sent to your Prim MD by signing hospital release before you go home.  If you experience worsening of your admission symptoms, develop shortness of breath, life threatening emergency, suicidal or homicidal thoughts you must seek medical attention immediately by calling 911 or calling your MD immediately  if symptoms less severe.  You Must read complete instructions/literature along with all the possible adverse reactions/side effects for all the Medicines you take and that have been prescribed to you. Take any new Medicines after you have completely understood and accpet all the possible adverse reactions/side effects.

## 2018-01-16 NOTE — Care Management Important Message (Signed)
Important Message  Patient Details  Name: Stacey [name] MRN: [id number] Date of Birth: Dec 07, 1945   Medicare Important Message Given:  Yes    Tiffannie Sloss 01/16/2018, 2:30 PM

## 2018-01-16 NOTE — Progress Notes (Signed)
NURSING PROGRESS NOTE  Stacey [name] [id number] Discharge Data: 01/16/2018 5:17 PM Attending Provider: No att. providers found VQQ:[id number], Greer Ee, NP     Cindy Hazy to be D/C'd Home per MD order.  Discussed with the patient the After Visit Summary and all questions fully answered. All IV's discontinued with no bleeding noted. All belongings returned to patient for patient to take home.   Last Vital Signs:  Blood pressure (!) 167/75, pulse 68, temperature 98.5 F (36.9 C), resp. rate 17, height 4\' 9"  (1.448 m), weight 63.8 kg, SpO2 97 %.  Discharge Medication List Allergies as of 01/16/2018      Reactions   Codeine Other (See Comments)   Other reaction(s): GI Intolerance Other Reaction: nausea   chest pain Nausea   Propoxyphene Other (See Comments)   Other Reaction: nausea and chest pain   Alendronate    Other reaction(s): Other (See Comments) Chest pain   Ciprofloxacin Rash   Lisinopril    Other reaction(s): Cough   Amlodipine Swelling   Aspirin    Other reaction(s): Other (See Comments)   Tramadol Nausea And Vomiting   Vicodin [hydrocodone-acetaminophen] Nausea And Vomiting      Medication List    STOP taking these medications   losartan 100 MG tablet Commonly known as:  COZAAR   naproxen 500 MG tablet Commonly known as:  NAPROSYN     TAKE these medications   albuterol 108 (90 Base) MCG/ACT inhaler Commonly known as:  PROVENTIL HFA;VENTOLIN HFA Inhale 2 puffs into the lungs every 6 (six) hours as needed for wheezing or shortness of breath.   carvedilol 6.25 MG tablet Commonly known as:  COREG Take 1 tablet (6.25 mg total) by mouth 2 (two) times daily with a meal. What changed:    medication strength  how much to take   cetirizine 10 MG tablet Commonly known as:  ZYRTEC Take 1 tablet (10 mg total) by mouth daily. What changed:  when to take this   clobetasol ointment 0.05 % Commonly known as:  TEMOVATE Apply 1 application topically 2 (two)  times daily as needed (rash).   cloNIDine 0.1 MG tablet Commonly known as:  CATAPRES Take 1 tablet (0.1 mg total) by mouth 2 (two) times daily.   colchicine 0.6 MG tablet Take 1 tablet (0.6 mg total) by mouth daily.   famotidine 20 MG tablet Commonly known as:  PEPCID Take 1 tablet (20 mg total) by mouth daily.   fluticasone 110 MCG/ACT inhaler Commonly known as:  FLOVENT HFA Take 2 puffs at bed time What changed:    how much to take  how to take this  when to take this  reasons to take this  additional instructions   furosemide 20 MG tablet Commonly known as:  LASIX Take 2 tablets (40 mg total) by mouth daily. What changed:  how much to take   hydrALAZINE 50 MG tablet Commonly known as:  APRESOLINE Take 2 tablets (100 mg total) by mouth 3 (three) times daily.   latanoprost 0.005 % ophthalmic solution Commonly known as:  XALATAN Place 1 drop into both eyes at bedtime.   levothyroxine 88 MCG tablet Commonly known as:  SYNTHROID, LEVOTHROID Take 88 mcg by mouth daily before breakfast.   methylPREDNISolone 4 MG Tbpk tablet Commonly known as:  MEDROL DOSEPAK follow package directions   montelukast 10 MG tablet Commonly known as:  SINGULAIR Take 1 tablet (10 mg total) by mouth daily. What changed:  when to take  this   MULTIVITAMIN GUMMIES WOMENS Diona Fanti 2 tablets by mouth daily.   ondansetron 8 MG tablet Commonly known as:  ZOFRAN Take by mouth every 8 (eight) hours as needed for nausea or vomiting.   pantoprazole 40 MG tablet Commonly known as:  PROTONIX Take 1 tablet (40 mg total) by mouth as needed. What changed:  reasons to take this   pravastatin 20 MG tablet Commonly known as:  PRAVACHOL TAKE 1 TABLET(20 MG) BY MOUTH EVERY NIGHT What changed:    how much to take  how to take this  when to take this  additional instructions   RESTASIS 0.05 % ophthalmic emulsion Generic drug:  cycloSPORINE Place 1 drop into both eyes 2 (two) times  daily.   SENSIPAR 30 MG tablet Generic drug:  cinacalcet Take 30 mg by mouth once a week.   timolol 0.25 % ophthalmic solution Commonly known as:  TIMOPTIC Place 1 drop into both eyes 2 (two) times daily.   Vitamin D3 3000 units Tabs Take 3,000 Units by mouth daily.            Durable Medical Equipment  (From admission, onward)         Start     Ordered   01/16/18 1021  For home use only DME 3 n 1  Once     01/16/18 1020

## 2018-01-16 NOTE — Care Management Important Message (Signed)
Important Message  Patient Details  Name: Stacey [name] MRN: [id number] Date of Birth: [birth year]   Medicare Important Message Given:  Yes    Erenest Rasher, RN 01/16/2018, 10:39 AM

## 2018-01-16 NOTE — Discharge Summary (Signed)
Stacey [name] ZOX:[id number] DOB: Apr 06, 1945 DOA: 01/12/2018  PCP: Ronnell Freshwater, NP  Admit date: 01/12/2018  Discharge date: 01/16/2018  Admitted From: Home   Disposition:  Home   Recommendations for Outpatient Follow-up:   Follow up with PCP in 1-2 weeks  PCP Please obtain BMP/CBC, 2 view CXR in 1week,  (see Discharge instructions)   PCP Please follow up on the following pending results:    Home Health: PT,RN   Equipment/Devices: Wrist splint  Consultations: None Discharge Condition: Stable   CODE STATUS: Full   Diet Recommendation: Heart Healthy 1.5 L/day total fluid restriction   Chief Complaint  Patient presents with  . Shortness of Breath  . Excessive Sweating  . 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 She reported anon productive coughand dyspnea mostlyat night foramonthfor which she was taking OTC cold medications.Shewas coming to [facility] to visit a church friend and suddenly felt acutely short of breath. Over the next hour, she became diaphoretic, lightheaded and nauseousw/emesis x 2. Initial SO2 was 72% and she was placed on BiPAP. BPwaselevated to 562-130 systolic.                                                                  Hospital Course   1.  Hypertensive crisis causing acute hypoxic respiratory failure due to acute on chronic combined systolic and diastolic CHF last EF now improved from 35% to 55%, requiring BiPAP upon admission.  Her blood pressure has been well controlled now, initially required IV nitro drip now off of it, has been diuresed  and now down to room air.  Echo is noted with improved EF compared to previously known EF.  Adjusted home medications and will be discharged on her adjusted home medications, ACE inhibitor held due to recent ARF which is still improving.  PCP to monitor in a week and adjust further.  Patient counseled on compliance.  2.  ARF.  Likely due to combination of ARB and normalization of blood pressure, blood pressure medications were adjusted to allow for slight hypertension, she received some IV fluids with improvement in acute renal failure, she has good urine output, blood pressure medications adjusted upon discharge with ARB being discontinued, PCP to monitor renal function and blood pressure in 1 week and adjust further medications as needed.   3.  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  1 above, echo noted as below.  4. COPD.  No acute issues no wheezing.  On room air.  Supportive  care as needed.  5.  Hypothyroidism.  On Synthroid continue.  6.  Dyslipidemia.  On statin continue.  7. R Wrist/Hand pain - acute gout along with pseudogout, high Uric acid and evidence of chondrocalcinosis on x-ray,  improved after 2 doses of colchicine and 1 dose of IV Solu-Medrol, will be placed on colchicine and Medrol Dosepak taper.  X-ray stable.  CP to monitor uric acid levels and start allopurinol may be 6 to 8 weeks after the symptoms have improved.  He will get a wrist splint upon discharge which has helped her considerably.  Home PT OT also ordered.     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   Sarcoidosis   Acute respiratory failure with hypoxia (HCC)   ARF (acute renal failure) ([place])   Hypertensive crisis    Discharge instructions    Discharge Instructions    Discharge instructions    Complete by:  As directed    Follow with Primary MD Ronnell Freshwater, NP in 7 days   Get CBC, CMP, Uric Acid checked  by Primary MD in 5-7 days   Activity: As tolerated with Full fall precautions use walker/cane & assistance as needed  Disposition Home   Diet: Heart Healthy with 1.5 L/day total fluid restriction   Special Instructions: If you have smoked or chewed Tobacco  in the last 2 yrs please stop smoking, stop any regular Alcohol  and or any Recreational drug use.  On your next visit with your primary care physician please Get Medicines reviewed and adjusted.  Please request your Prim.MD to go over all Hospital Tests and Procedure/Radiological results at the follow up, please get all Hospital records sent to your Prim MD by signing hospital release before you go home.  If you experience worsening of your admission symptoms, develop shortness of breath, life threatening emergency, suicidal or homicidal thoughts you must seek medical attention immediately by calling 911 or calling your MD immediately  if symptoms less severe.  You Must read complete instructions/literature along with all the possible adverse reactions/side effects for all the Medicines you take and that have been prescribed to you. Take any new Medicines after you have completely understood and accpet all the possible adverse reactions/side effects.   Increase activity slowly   Complete by:  As directed       Discharge Medications   Allergies as of 01/16/2018      Reactions   Codeine Other (See Comments)   Other reaction(s): GI Intolerance Other Reaction: nausea   chest pain Nausea   Propoxyphene Other (See Comments)   Other Reaction: nausea and chest pain   Alendronate    Other reaction(s): Other (See Comments) Chest pain   Ciprofloxacin Rash   Lisinopril    Other reaction(s): Cough   Amlodipine Swelling   Aspirin    Other reaction(s): Other (See Comments)   Tramadol Nausea And Vomiting   Vicodin  [hydrocodone-acetaminophen] Nausea And Vomiting      Medication List    STOP taking these medications   losartan 100 MG tablet Commonly known as:  COZAAR   naproxen 500 MG tablet Commonly known as:  NAPROSYN     TAKE these medications   albuterol 108 (90 Base) MCG/ACT inhaler Commonly known as:  PROVENTIL HFA;VENTOLIN HFA Inhale 2 puffs into the lungs every 6 (six) hours as needed for wheezing or shortness of breath.   carvedilol 6.25 MG  tablet Commonly known as:  COREG Take 1 tablet (6.25 mg total) by mouth 2 (two) times daily with a meal. What changed:    medication strength  how much to take   cetirizine 10 MG tablet Commonly known as:  ZYRTEC Take 1 tablet (10 mg total) by mouth daily. What changed:  when to take this   clobetasol ointment 0.05 % Commonly known as:  TEMOVATE Apply 1 application topically 2 (two) times daily as needed (rash).   cloNIDine 0.1 MG tablet Commonly known as:  CATAPRES Take 1 tablet (0.1 mg total) by mouth 2 (two) times daily.   colchicine 0.6 MG tablet Take 1 tablet (0.6 mg total) by mouth daily.   famotidine 20 MG tablet Commonly known as:  PEPCID Take 1 tablet (20 mg total) by mouth daily.   fluticasone 110 MCG/ACT inhaler Commonly known as:  FLOVENT HFA Take 2 puffs at bed time What changed:    how much to take  how to take this  when to take this  reasons to take this  additional instructions   furosemide 20 MG tablet Commonly known as:  LASIX Take 2 tablets (40 mg total) by mouth daily. What changed:  how much to take   hydrALAZINE 50 MG tablet Commonly known as:  APRESOLINE Take 2 tablets (100 mg total) by mouth 3 (three) times daily.   latanoprost 0.005 % ophthalmic solution Commonly known as:  XALATAN Place 1 drop into both eyes at bedtime.   levothyroxine 88 MCG tablet Commonly known as:  SYNTHROID, LEVOTHROID Take 88 mcg by mouth daily before breakfast.   methylPREDNISolone 4 MG Tbpk  tablet Commonly known as:  MEDROL DOSEPAK follow package directions   montelukast 10 MG tablet Commonly known as:  SINGULAIR Take 1 tablet (10 mg total) by mouth daily. What changed:  when to take this   MULTIVITAMIN GUMMIES WOMENS Diona Fanti 2 tablets by mouth daily.   ondansetron 8 MG tablet Commonly known as:  ZOFRAN Take by mouth every 8 (eight) hours as needed for nausea or vomiting.   pantoprazole 40 MG tablet Commonly known as:  PROTONIX Take 1 tablet (40 mg total) by mouth as needed. What changed:  reasons to take this   pravastatin 20 MG tablet Commonly known as:  PRAVACHOL TAKE 1 TABLET(20 MG) BY MOUTH EVERY NIGHT What changed:    how much to take  how to take this  when to take this  additional instructions   RESTASIS 0.05 % ophthalmic emulsion Generic drug:  cycloSPORINE Place 1 drop into both eyes 2 (two) times daily.   SENSIPAR 30 MG tablet Generic drug:  cinacalcet Take 30 mg by mouth once a week.   timolol 0.25 % ophthalmic solution Commonly known as:  TIMOPTIC Place 1 drop into both eyes 2 (two) times daily.   Vitamin D3 3000 units Tabs Take 3,000 Units by mouth daily.       Follow-up Information    Ronnell Freshwater, NP. Schedule an appointment as soon as possible for a visit in 1 week(s).   Specialty:  Family Medicine Contact information: [address] [place] [address] [phone]           Major procedures and Radiology Reports - PLEASE review detailed and final reports thoroughly  -      Echo  - Left ventricle: The cavity size was normal. Wall thickness wasnormal. Systolic function was normal. The estimated ejection fraction was in the range of 50% to 55%.  Doppler parameters areconsistent with abnormal left ventricular relaxation (grade 1diastolic dysfunction). Doppler parameters are consistent withhigh ventricular filling pressure. - Left atrium: The atrium was mildly dilated. - Pericardium, extracardiac: A  small pericardial effusion wasidentified.  Renal US - Somewhat atrophic kidneys with increased echotexture of the right kidney. This likely reflects chronic medical renal disease. No hydronephrosis. Enlarged fibroid uterus.   Dg Wrist Complete Right  Result Date: 01/15/2018 CLINICAL DATA:  Right wrist and hand pain. EXAM: RIGHT WRIST - COMPLETE 3+ VIEW COMPARISON:  Hand series performed today FINDINGS: Arthritic changes in the right wrist with joint space narrowing in the radiocarpal joint. Chondrocalcinosis in the triangular fibrocartilage. No acute bony abnormality. Specifically, no fracture, subluxation, or dislocation. 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 No acute bony abnormality. Electronically Signed   By: Rolm Baptise M.D.   On: 01/15/2018 10:13   US Renal  Result Date: 01/15/2018 CLINICAL DATA:  Acute renal failure EXAM: RENAL / URINARY TRACT ULTRASOUND COMPLETE COMPARISON:  CT 07/27/2010 FINDINGS: Right Kidney: Length: 8.2 cm. Diffusely increased echotexture. No mass or hydronephrosis. Left Kidney: Length: 8.5 cm. Echogenicity within normal limits. No mass or hydronephrosis visualized. Bladder: Appears normal for degree of bladder distention. Incidentally noted is enlarged fibroid uterus. IMPRESSION: Somewhat atrophic kidneys with increased echotexture of the right kidney. This likely reflects chronic medical renal disease. No hydronephrosis. Enlarged fibroid uterus. Electronically Signed   By: Rolm Baptise M.D.   On: 01/15/2018 07:20   Dg Chest Portable 1 View  Result Date: 01/12/2018 CLINICAL DATA:  Respiratory distress and dyspnea. EXAM: PORTABLE CHEST 1 VIEW COMPARISON:  04/26/2017 CXR FINDINGS: Cardiomegaly with interstitial edema. Slightly more confluent retrocardiac opacities are noted with air bronchograms. Superimposed pneumonia would be difficult to entirely exclude. Mild aortic atherosclerosis at the arch. Lung apices are excluded on this  AP portable semi upright view. IMPRESSION: Cardiomegaly with interstitial edema and mild aortic atherosclerosis. Air bronchograms in the retrocardiac portion of the chest cannot exclude pneumonia. Electronically Signed   By: Ashley Royalty M.D.   On: 01/12/2018 22:38   Dg Hand Complete Right  Result Date: 01/15/2018 CLINICAL DATA:  Right hand pain, swelling. EXAM: RIGHT HAND - COMPLETE 3+ VIEW COMPARISON:  None. 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 Chondrocalcinosis in the triangular fibrocartilage. No acute bony abnormality. Specifically, no fracture, subluxation, or dislocation. IMPRESSION: Chondrocalcinosis and arthritic changes in the right wrist. No acute bony abnormality. Electronically Signed   By: Rolm Baptise M.D.   On: 01/15/2018 10:12   Mm 3d Screen Breast Bilateral  Result Date: 12/25/2017 CLINICAL DATA:  Screening. EXAM: DIGITAL SCREENING BILATERAL MAMMOGRAM WITH TOMO AND CAD COMPARISON:  Previous exam(s). ACR Breast Density Category b: There are scattered areas of fibroglandular density. FINDINGS: There are no findings suspicious for malignancy. Images were processed with CAD. IMPRESSION: No mammographic evidence of malignancy. A result letter of this screening mammogram will be mailed directly to the patient. RECOMMENDATION: Screening mammogram in one year. (Code:SM-B-01Y) BI-RADS CATEGORY  1: Negative. Electronically Signed   By: Everlean Alstrom M.D.   On: 12/25/2017 16:30    Micro Results     Recent Results (from the past 240 hour(s))  Culture, blood (routine x 2)     Status: None (Preliminary result)   Collection Time: 01/12/18 11:58 PM  Result Value Ref Range Status   Specimen Description BLOOD RIGHT ARM  Final   Special Requests   Final    BOTTLES DRAWN AEROBIC AND ANAEROBIC Blood Culture  adequate volume   Culture   Final    NO GROWTH 2 DAYS Performed at [facility], [place] [address] [place], [place] [address]    Report Status PENDING  Incomplete   Culture, blood (routine x 2)     Status: None (Preliminary result)   Collection Time: 01/13/18  1:20 AM  Result Value Ref Range Status   Specimen Description BLOOD RIGHT ARM  Final   Special Requests   Final    BOTTLES DRAWN AEROBIC AND ANAEROBIC Blood Culture adequate volume   Culture   Final    NO GROWTH 2 DAYS Performed at [facility], [place] [address], [place], [place] [address]    Report Status PENDING  Incomplete    Today   Subjective    Tauri [place] today has no headache,no chest abdominal pain,no new weakness tingling or numbness, feels much better wants to go home today.     Objective   Blood pressure (!) 181/81, pulse 68, temperature 98.5 F (36.9 C), resp. rate 17, height 4\' 9"  (1.448 m), weight 63.8 kg, SpO2 95 %.   Intake/Output Summary (Last 24 hours) at 01/16/2018 0851 Last data filed at 01/15/2018 1841 Gross per 24 hour  Intake 1464.42 ml  Output -  Net 1464.42 ml    Exam Awake Alert, Oriented x 3, No new F.N deficits, Normal affect [place].AT,PERRAL Supple Neck,No JVD, No cervical lymphadenopathy appriciated.  Symmetrical Chest wall movement, Good air movement bilaterally, CTAB RRR,No Gallops,Rubs or new Murmurs, No Parasternal Heave +ve B.Sounds, Abd Soft, Non tender, No organomegaly appriciated, No rebound -guarding or rigidity. No Cyanosis, Clubbing or edema, No new Rash or bruise, R hand in splint   Data Review   CBC w Diff:  Lab Results  Component Value Date   WBC 6.8 01/14/2018   HGB 11.5 (L) 01/14/2018   HGB 12.7 11/13/2013   HCT 38.2 01/14/2018   HCT 39.9 11/13/2013   PLT 189 01/14/2018   PLT 169 11/13/2013   LYMPHOPCT 46 01/12/2018   LYMPHOPCT 20.7 11/13/2013   MONOPCT 8 01/12/2018   MONOPCT 11.8 11/13/2013   EOSPCT 1 01/12/2018   EOSPCT 0.9 11/13/2013   BASOPCT 1 01/12/2018   BASOPCT 0.8 11/13/2013    CMP:  Lab Results  Component Value Date   NA 136 01/16/2018   NA 142 11/13/2013   K 4.3 01/16/2018   K 4.0 11/13/2013    CL 104 01/16/2018   CL 109 (H) 11/13/2013   CO2 26 01/16/2018   CO2 26 11/13/2013   BUN 19 01/16/2018   BUN 22 (H) 11/13/2013   CREATININE 1.59 (H) 01/16/2018   CREATININE 1.11 11/13/2013   PROT 6.3 (L) 01/14/2018   ALBUMIN 2.9 (L) 01/14/2018   BILITOT 0.5 01/14/2018   ALKPHOS 47 01/14/2018   AST 23 01/14/2018   ALT 15 01/14/2018  .   Total Time in preparing paper work, data evaluation and todays exam - 61 minutes  Lala Lund M.D on 01/16/2018 at 8:51 AM  [facility]   Office  [phone]

## 2018-01-16 NOTE — Care Management Note (Signed)
Case Management Note  Patient Details  Name: Stacey [name] MRN: [id number] Date of Birth: [birth year]  Subjective/Objective:    Acute respiratory failure, HTN urgency                Action/Plan: NCM spoke to pt and states she lives at home with husband. Has cane at home. Requesting 3n1 bedside commode. Contacted [facility] for 3n1 bc to be delivered to room prior to dc. Offered choice for HH/list provided. Pt agreeable to [facility]. Contacted [facility] rep with new referral.   PCP Leretha Pol MD  Expected Discharge Date:  01/16/18               Expected Discharge Plan:  [place]  In-House Referral:  NA  Discharge planning Services  CM Consult  Post Acute Care Choice:  Home Health Choice offered to:  Patient  DME Arranged:  3-N-1 DME Agency:  [place] Arranged:  RN, PT, OT [facility] Agency:  [facility]  Status of Service:  Completed, signed off  If discussed at [place] of Stay Meetings, dates discussed:    Additional Comments:  Erenest Rasher, RN 01/16/2018, 10:31 AM

## 2018-01-16 NOTE — Plan of Care (Signed)

## 2018-01-18 ENCOUNTER — Other Ambulatory Visit: Payer: Self-pay | Admitting: *Deleted

## 2018-01-18 LAB — CULTURE, BLOOD (ROUTINE X 2)
CULTURE: NO GROWTH
Culture: NO GROWTH
Special Requests: ADEQUATE
Special Requests: ADEQUATE

## 2018-01-18 NOTE — Patient Outreach (Addendum)
[facility]) Care Management  01/18/2018  Stacey [name] [birth year] [id number]   Initial Telephone assessment   Referral received : 01/17/18  Referral source: Primary Navigator  Referral reason : Education and support on chronic medical conditions.  Insurance : Medicare  Patient with inpatient admission at [facility] 10/27-10/30,  Dx: Hypertensive crisis, Acute respiratory failure, Heart failure.  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 Heart failure , Hypertension.   Outreach call to patient, explained reason for the call and [facility] care management services.  Patient reports that she is feeling pretty good on today, just tired.   Conditions :   Hypertension  Patient reports that she is taking her medication as prescribed, verbalizes that they have taken her off losartan, she denies cost concern. Patient has a wrist blood pressure monitor that she uses at home , but she has not checked blood pressure on today yet, explained the importance of keeping a check of blood pressure , and review of normal ranges and when to call the doctor. . Patient discussed she tries to watch the salt intake but is not sure of daily limits  Patient reports her appetite and intake about "medium" denies nausea or vomiting.    Heart Failure Patient denies shortness of breath or swelling increase . Patient reports that she has not weighed on today yet, states her scales need recalibrating , states they are in working order.  Patient denies being aware of Heart failure zone and wosening symptoms action plan. Reviewed yellow zone symptoms and importance of notifying of worsening symptoms of weight gain of 3 pounds in a day, increased swelling. Reviewed discharge recommendation of 1.5 liters per day of fluid restriction.   Right upper arm pain Reports wearing wrist splint that she was provided while in the hospital, reports improvement of pain.  Taking  dose packet of medrol   Social Patient lives at home with her spouse her sister is available to assist her with bath as needed,and spouse assist as needed.  Patient reports having a cane for use at home as needed.  Patient has been contacted by [facility] home health and reports visit planned for 11/3.  Medications Denies cost concerns. Patient was recently discharged from hospital and all medications have been reviewed.  Appointments Patient has not made follow up with PCP , but agreeable to scheduling visit in the next week.   Patient is agreeable to [facility] care management services, for education and support of chronic medical conditions of Heart failure and hypertension.    Plan Will schedule home visit in the next week. Will send PCP involvement barrier letter. Provided patient with [facility] care coordinator contact information, 24 nurse line.      THN CM Care Plan Problem One     Most Recent Value  Care Plan Problem One  Knowledge deficit related to CHF action plan as evidenced by recent hospital admission   Role Documenting the Problem One  Care Management [place] for Problem One  Active  THN Long Term Goal   Over the next 60 days patient will be able to verbalize the plan of action for managing heart failure over the next 60 days    THN Long Term Goal Start Date  01/18/18  Interventions for Problem One Long Term Goal  Reviewed current clinical state and discharge instructions , Advised regarding taking medications as prescribed. , reviewed yellow zone symptoms of HF and action plan   THN CM  Short Term Goal #1   Patient will be able to report weighing daily and keeping a record over the next 30 days   THN CM Short Term Goal #1 Start Date  01/18/18  Interventions for Short Term Goal #1  Advised regarding benefits of tracking weights daily, reviewed best time of day to weigh , in the morning after urinating , about same time of day.   THN CM Short Term Goal #2   Patient will  keep all scheduled medical appointments over the next 30 days   THN CM Short Term Goal #2 Start Date  01/18/18  Interventions for Short Term Goal #2  Advised regarding importance of keeping all medical appointments, scheduling post discharge visit and taking medications for review at visit.      THN CM Care Plan Problem Two     Most Recent Value  Care Plan Problem Two  Knowledge related to self care management of Hypertension as evidenced by recent hospital admission for hypertensive crisis.   Role Documenting the Problem Two  Care Management Coordinator  Care Plan for Problem Two  Active  THN CM Short Term Goal #1   Patietn will be able to verbalize checking blood pressure daily and keeping a record over the next 30 days   THN CM Short Term Goal #1 Start Date  01/18/18  Interventions for Short Term Goal #2   Discussed patient equipment at home, educated on why important to check blood pressure at home, review of normal /high reading and notifying MD of concerns .        Joylene Draft, RN, [place] Management Coordinator  [phone]- Mobile [phone]- Toll Free Main Office

## 2018-01-20 DIAGNOSIS — Z7951 Long term (current) use of inhaled steroids: Secondary | ICD-10-CM | POA: Diagnosis not present

## 2018-01-20 DIAGNOSIS — E039 Hypothyroidism, unspecified: Secondary | ICD-10-CM | POA: Diagnosis not present

## 2018-01-20 DIAGNOSIS — E785 Hyperlipidemia, unspecified: Secondary | ICD-10-CM | POA: Diagnosis not present

## 2018-01-20 DIAGNOSIS — M810 Age-related osteoporosis without current pathological fracture: Secondary | ICD-10-CM | POA: Diagnosis not present

## 2018-01-20 DIAGNOSIS — E559 Vitamin D deficiency, unspecified: Secondary | ICD-10-CM | POA: Diagnosis not present

## 2018-01-20 DIAGNOSIS — I5042 Chronic combined systolic (congestive) and diastolic (congestive) heart failure: Secondary | ICD-10-CM | POA: Diagnosis not present

## 2018-01-20 DIAGNOSIS — I110 Hypertensive heart disease with heart failure: Secondary | ICD-10-CM | POA: Diagnosis not present

## 2018-01-20 DIAGNOSIS — J449 Chronic obstructive pulmonary disease, unspecified: Secondary | ICD-10-CM | POA: Diagnosis not present

## 2018-01-20 DIAGNOSIS — D869 Sarcoidosis, unspecified: Secondary | ICD-10-CM | POA: Diagnosis not present

## 2018-01-20 DIAGNOSIS — Z9181 History of falling: Secondary | ICD-10-CM | POA: Diagnosis not present

## 2018-01-20 DIAGNOSIS — F4310 Post-traumatic stress disorder, unspecified: Secondary | ICD-10-CM | POA: Diagnosis not present

## 2018-01-20 DIAGNOSIS — K219 Gastro-esophageal reflux disease without esophagitis: Secondary | ICD-10-CM | POA: Diagnosis not present

## 2018-01-20 DIAGNOSIS — J9691 Respiratory failure, unspecified with hypoxia: Secondary | ICD-10-CM | POA: Diagnosis not present

## 2018-01-22 DIAGNOSIS — J449 Chronic obstructive pulmonary disease, unspecified: Secondary | ICD-10-CM | POA: Diagnosis not present

## 2018-01-22 DIAGNOSIS — I5042 Chronic combined systolic (congestive) and diastolic (congestive) heart failure: Secondary | ICD-10-CM | POA: Diagnosis not present

## 2018-01-22 DIAGNOSIS — J9691 Respiratory failure, unspecified with hypoxia: Secondary | ICD-10-CM | POA: Diagnosis not present

## 2018-01-22 DIAGNOSIS — F4310 Post-traumatic stress disorder, unspecified: Secondary | ICD-10-CM | POA: Diagnosis not present

## 2018-01-22 DIAGNOSIS — I110 Hypertensive heart disease with heart failure: Secondary | ICD-10-CM | POA: Diagnosis not present

## 2018-01-22 DIAGNOSIS — M810 Age-related osteoporosis without current pathological fracture: Secondary | ICD-10-CM | POA: Diagnosis not present

## 2018-01-23 ENCOUNTER — Encounter: Payer: Self-pay | Admitting: Adult Health

## 2018-01-23 ENCOUNTER — Ambulatory Visit (INDEPENDENT_AMBULATORY_CARE_PROVIDER_SITE_OTHER): Payer: Medicare Other | Admitting: Adult Health

## 2018-01-23 ENCOUNTER — Telehealth: Payer: Self-pay

## 2018-01-23 VITALS — BP 136/88 | HR 75 | Temp 98.5°F | Resp 16 | Ht <= 58 in | Wt 137.0 lb

## 2018-01-23 DIAGNOSIS — J9601 Acute respiratory failure with hypoxia: Secondary | ICD-10-CM | POA: Diagnosis not present

## 2018-01-23 DIAGNOSIS — I169 Hypertensive crisis, unspecified: Secondary | ICD-10-CM | POA: Diagnosis not present

## 2018-01-23 DIAGNOSIS — I10 Essential (primary) hypertension: Secondary | ICD-10-CM | POA: Diagnosis not present

## 2018-01-23 DIAGNOSIS — J449 Chronic obstructive pulmonary disease, unspecified: Secondary | ICD-10-CM | POA: Diagnosis not present

## 2018-01-23 DIAGNOSIS — K219 Gastro-esophageal reflux disease without esophagitis: Secondary | ICD-10-CM | POA: Diagnosis not present

## 2018-01-23 MED ORDER — PANTOPRAZOLE SODIUM 40 MG PO TBEC: 40.0000 mg | DELAYED_RELEASE_TABLET | ORAL | 2 refills | Status: DC | PRN

## 2018-01-23 NOTE — Progress Notes (Signed)
[facility] [place], [place] [address]  Internal MEDICINE  Office Visit Note  Patient Name: Stacey [name]  [id number]  [id number]  Date of Service: 01/23/2018     Chief Complaint  Patient presents with  . Hospitalization Follow-up    panic attack,sob , blood pressure running high, medication refills     HPI Pt is here for recent hospital follow up.  Patient was admitted to the hospital on October 26.  She was on the way to the hospital to visit a friend who had been in a bad accident, only she became very short of breath and when she arrived at the hospital was much worse.  She denies any chest pain at the time however she did vomit twice.  She was found to have an elevated lactic acid level in the SaO2 of 72%.  She also was extremely hypertensive with her systolic being 166-063.  She was placed on BiPAP and was admitted into the hospital.  After a few days she was discharged from the hospital on October 30.  She is here today for follow-up.  She denies any shortness of breath or issues with her blood pressure since discharge.  Current Medication: Outpatient Encounter Medications as of 01/23/2018  Medication Sig  . albuterol (VENTOLIN HFA) 108 (90 Base) MCG/ACT inhaler Inhale 2 puffs into the lungs every 6 (six) hours as needed for wheezing or shortness of breath.  . carvedilol (COREG) 6.25 MG tablet Take 1 tablet (6.25 mg total) by mouth 2 (two) times daily with a meal.  . cetirizine (ZYRTEC) 10 MG tablet Take 1 tablet (10 mg total) by mouth daily. (Patient taking differently: Take 10 mg by mouth at bedtime. )  . Cholecalciferol (VITAMIN D3) 3000 units TABS Take 3,000 Units by mouth daily.   . cinacalcet (SENSIPAR) 30 MG tablet Take 30 mg by mouth once a week.   . clobetasol ointment (TEMOVATE) 0.16 % Apply 1 application topically 2 (two) times daily as needed (rash).   . cloNIDine (CATAPRES) 0.1 MG tablet Take 1 tablet (0.1 mg total) by mouth 2 (two) times  daily.  . colchicine 0.6 MG tablet Take 1 tablet (0.6 mg total) by mouth daily.  . fluticasone (FLOVENT HFA) 110 MCG/ACT inhaler Take 2 puffs at bed time (Patient taking differently: Inhale 2 puffs into the lungs at bedtime as needed (shortness of breath). )  . furosemide (LASIX) 20 MG tablet Take 2 tablets (40 mg total) by mouth daily. (Patient taking differently: Take 20 mg by mouth daily. )  . hydrALAZINE (APRESOLINE) 50 MG tablet Take 2 tablets (100 mg total) by mouth 3 (three) times daily.  Marland Kitchen latanoprost (XALATAN) 0.005 % ophthalmic solution Place 1 drop into both eyes at bedtime.   Marland Kitchen levothyroxine (SYNTHROID, LEVOTHROID) 88 MCG tablet Take 88 mcg by mouth daily before breakfast.   . methylPREDNISolone (MEDROL DOSEPAK) 4 MG TBPK tablet follow package directions  . montelukast (SINGULAIR) 10 MG tablet Take 1 tablet (10 mg total) by mouth daily. (Patient taking differently: Take 10 mg by mouth at bedtime. )  . Multiple Vitamins-Minerals (MULTIVITAMIN GUMMIES WOMENS) CHEW Chew 2 tablets by mouth daily.  . ondansetron (ZOFRAN) 8 MG tablet Take by mouth every 8 (eight) hours as needed for nausea or vomiting.  . pantoprazole (PROTONIX) 40 MG tablet Take 1 tablet (40 mg total) by mouth as needed.  . pravastatin (PRAVACHOL) 20 MG tablet TAKE 1 TABLET(20 MG) BY MOUTH EVERY NIGHT (Patient taking differently: Take  20 mg by mouth at bedtime. )  . RESTASIS 0.05 % ophthalmic emulsion Place 1 drop into both eyes 2 (two) times daily.  . timolol (TIMOPTIC) 0.25 % ophthalmic solution Place 1 drop into both eyes 2 (two) times daily.   . [DISCONTINUED] pantoprazole (PROTONIX) 40 MG tablet Take 1 tablet (40 mg total) by mouth as needed. (Patient taking differently: Take 40 mg by mouth as needed (reflux). )  . famotidine (PEPCID) 20 MG tablet Take 1 tablet (20 mg total) by mouth daily. (Patient not taking: Reported on 01/13/2018)   No facility-administered encounter medications on file as of 01/23/2018.      Surgical History: Past Surgical History:  Procedure Laterality Date  . GALLBLADDER SURGERY    . KNEE SURGERY    . thyroidectomy      Medical History: Past Medical History:  Diagnosis Date  . Hypertension   . Thyroid disease     Family History: Family History  Problem Relation Age of Onset  . Hypertension Mother   . Diabetes Mother   . Anuerysm Mother   . Diabetes Father   . Hypertension Father     Social History   Socioeconomic History  . Marital status: Widowed    Spouse name: Not on file  . Number of children: Not on file  . Years of education: Not on file  . Highest education level: Not on file  Occupational History  . Not on file  Social Needs  . Financial resource strain: Not on file  . Food insecurity:    Worry: Not on file    Inability: Not on file  . Transportation needs:    Medical: Not on file    Non-medical: Not on file  Tobacco Use  . Smoking status: Never Smoker  . Smokeless tobacco: Never Used  Substance and Sexual Activity  . Alcohol use: No  . Drug use: Yes    Types: "Crack" cocaine  . Sexual activity: Not on file  Lifestyle  . Physical activity:    Days per week: Not on file    Minutes per session: Not on file  . Stress: Not on file  Relationships  . Social connections:    Talks on phone: Not on file    Gets together: Not on file    Attends religious service: Not on file    Active member of club or organization: Not on file    Attends meetings of clubs or organizations: Not on file    Relationship status: Not on file  . Intimate partner violence:    Fear of current or ex partner: Not on file    Emotionally abused: Not on file    Physically abused: Not on file    Forced sexual activity: Not on file  Other Topics Concern  . Not on file  Social History Narrative  . Not on file      Review of Systems  Constitutional: Negative for chills, fatigue and unexpected weight change.  HENT: Negative for congestion, rhinorrhea,  sneezing and sore throat.   Eyes: Negative for photophobia, pain and redness.  Respiratory: Negative for cough, chest tightness and shortness of breath.   Cardiovascular: Negative for chest pain and palpitations.  Gastrointestinal: Negative for abdominal pain, constipation, diarrhea, nausea and vomiting.  Endocrine: Negative.   Genitourinary: Negative for dysuria and frequency.  Musculoskeletal: Negative for arthralgias, back pain, joint swelling and neck pain.  Skin: Negative for rash.  Allergic/Immunologic: Negative.   Neurological: Negative for tremors and  numbness.  Hematological: Negative for adenopathy. Does not bruise/bleed easily.  Psychiatric/Behavioral: Negative for behavioral problems and sleep disturbance. The patient is not nervous/anxious.     Vital Signs: BP 136/88 (BP Location: Left Arm, Patient Position: Sitting, Cuff Size: Normal)   Pulse 75   Temp 98.5 F (36.9 C) (Oral)   Resp 16   Ht 4\' 9"  (1.448 m)   Wt 137 lb (62.1 kg)   SpO2 98%   BMI 29.65 kg/m    Physical Exam  Constitutional: She is oriented to person, place, and time. She appears well-developed and well-nourished. No distress.  HENT:  Head: Normocephalic and atraumatic.  Mouth/Throat: Oropharynx is clear and moist. No oropharyngeal exudate.  Eyes: Pupils are equal, round, and reactive to light. EOM are normal.  Neck: Normal range of motion. Neck supple. No JVD present. No tracheal deviation present. No thyromegaly present.  Cardiovascular: Normal rate, regular rhythm and normal heart sounds. Exam reveals no gallop and no friction rub.  No murmur heard. Pulmonary/Chest: Effort normal and breath sounds normal. No respiratory distress. She has no wheezes. She has no rales. She exhibits no tenderness.  Abdominal: Soft. There is no tenderness. There is no guarding.  Musculoskeletal: Normal range of motion.  Lymphadenopathy:    She has no cervical adenopathy.  Neurological: She is alert and oriented to  person, place, and time. No cranial nerve deficit.  Skin: Skin is warm and dry. She is not diaphoretic.  Psychiatric: She has a normal mood and affect. Her behavior is normal. Judgment and thought content normal.  Nursing note and vitals reviewed.  Assessment/Plan: 1. Hypertensive crisis Patient's hypertensive crisis was resolved while in hospital.  She was sent home and told to stop taking her losartan and she continues to take the Coreg.  Her blood pressure today is stable and we will continue to monitor in the future to see if losartan needs to be added back to her regimen.  2. Acute respiratory failure with hypoxia [facility]) Patient denies any difficulty breathing.  She denies cough or shortness of breath with any exertion.  3. Essential hypertension Blood pressure appears controlled at this time using Coreg twice daily.  4. Chronic obstructive pulmonary disease, unspecified COPD type ([place]) Patient COPD is stable, she will continue to use her inhalers and medications as prescribed.  We will continue to follow COPD.  5. Gastroesophageal reflux disease without esophagitis Well-controlled using Protonix.  Medication refilled at this time. - pantoprazole (PROTONIX) 40 MG tablet; Take 1 tablet (40 mg total) by mouth as needed.  Dispense: 90 tablet; Refill: 2  General Counseling: doll frazee understanding of the findings of todays visit and agrees with plan of treatment. I have discussed any further diagnostic evaluation that may be needed or ordered today. We also reviewed her medications today. she has been encouraged to call the office with any questions or concerns that should arise related to todays visit.   No orders of the defined types were placed in this encounter.   I have reviewed all medical records from hospital follow up including radiology reports and consults from other physicians. Appropriate follow up diagnostics will be scheduled as needed. Patient/ Family understands  the plan of treatment.  Time spent 25 minutes.  Kendell Bane AGNP-C Internal Medicine

## 2018-01-23 NOTE — Patient Instructions (Signed)

## 2018-01-23 NOTE — Telephone Encounter (Signed)
Gave verbal order Aldrin from [employer] [phone] fo physical therapy for 2 times a week for 4 weeks and pt had appt today for hospital  Follow up

## 2018-01-24 ENCOUNTER — Other Ambulatory Visit: Payer: Self-pay | Admitting: *Deleted

## 2018-01-24 ENCOUNTER — Encounter: Payer: Self-pay | Admitting: *Deleted

## 2018-01-24 DIAGNOSIS — J9691 Respiratory failure, unspecified with hypoxia: Secondary | ICD-10-CM | POA: Diagnosis not present

## 2018-01-24 DIAGNOSIS — F4310 Post-traumatic stress disorder, unspecified: Secondary | ICD-10-CM | POA: Diagnosis not present

## 2018-01-24 DIAGNOSIS — I110 Hypertensive heart disease with heart failure: Secondary | ICD-10-CM | POA: Diagnosis not present

## 2018-01-24 DIAGNOSIS — M810 Age-related osteoporosis without current pathological fracture: Secondary | ICD-10-CM | POA: Diagnosis not present

## 2018-01-24 DIAGNOSIS — J449 Chronic obstructive pulmonary disease, unspecified: Secondary | ICD-10-CM | POA: Diagnosis not present

## 2018-01-24 DIAGNOSIS — I5042 Chronic combined systolic (congestive) and diastolic (congestive) heart failure: Secondary | ICD-10-CM | POA: Diagnosis not present

## 2018-01-24 NOTE — Patient Outreach (Addendum)
[facility]) Care Management   01/24/2018  [name] June 11, 1945 [id number]  Stacey [name] is an 72 y.o. female  Subjective:  Patient discussed feeling much better now . She discussed episode leading up to her hospital admission elevated blood pressure.  She discussed recent stressor of preparing her recent wedding and sudden death of her friend. Patient discussed planned eye surgery in December for cataract extraction.   Objective:  BP 138/78 (BP Location: Left Arm, Patient Position: Sitting, Cuff Size: Normal)   Pulse 64   Resp 18   Ht 1.448 m (4\' 9" )   Wt 136 lb (61.7 kg)   SpO2 98%   BMI 29.43 kg/m  Review of Systems  Constitutional: Negative.   HENT: Negative.   Eyes: Negative.   Respiratory: Negative.   Cardiovascular: Negative.   Gastrointestinal: Negative.   Genitourinary: Negative.   Musculoskeletal: Negative.   Skin: Negative.   Neurological: Negative.   Endo/Heme/Allergies: Negative.   Psychiatric/Behavioral: Negative.     Physical Exam  Constitutional: She is oriented to person, place, and time. She appears well-developed and well-nourished.  Cardiovascular: Normal rate.  Respiratory: Effort normal and breath sounds normal.  GI: Soft.  Neurological: She is alert and oriented to person, place, and time.  Skin: Skin is warm and dry.  Psychiatric: She has a normal mood and affect. Her behavior is normal. Judgment and thought content normal.    Encounter Medications:   Outpatient Encounter Medications as of 01/24/2018  Medication Sig Note  . albuterol (VENTOLIN HFA) 108 (90 Base) MCG/ACT inhaler Inhale 2 puffs into the lungs every 6 (six) hours as needed for wheezing or shortness of breath.   . carvedilol (COREG) 6.25 MG tablet Take 1 tablet (6.25 mg total) by mouth 2 (two) times daily with a meal.   . cetirizine (ZYRTEC) 10 MG tablet Take 1 tablet (10 mg total) by mouth daily. (Patient taking differently: Take 10 mg by mouth at bedtime.  )   . Cholecalciferol (VITAMIN D3) 3000 units TABS Take 3,000 Units by mouth daily.    . cinacalcet (SENSIPAR) 30 MG tablet Take 30 mg by mouth once a week.    . clobetasol ointment (TEMOVATE) 8.65 % Apply 1 application topically 2 (two) times daily as needed (rash).    . cloNIDine (CATAPRES) 0.1 MG tablet Take 1 tablet (0.1 mg total) by mouth 2 (two) times daily.   . colchicine 0.6 MG tablet Take 1 tablet (0.6 mg total) by mouth daily.   . fluticasone (FLOVENT HFA) 110 MCG/ACT inhaler Take 2 puffs at bed time (Patient taking differently: Inhale 2 puffs into the lungs at bedtime as needed (shortness of breath). )   . furosemide (LASIX) 20 MG tablet Take 2 tablets (40 mg total) by mouth daily. (Patient taking differently: Take 20 mg by mouth daily. )   . hydrALAZINE (APRESOLINE) 50 MG tablet Take 2 tablets (100 mg total) by mouth 3 (three) times daily.   Marland Kitchen latanoprost (XALATAN) 0.005 % ophthalmic solution Place 1 drop into both eyes at bedtime.    Marland Kitchen levothyroxine (SYNTHROID, LEVOTHROID) 88 MCG tablet Take 88 mcg by mouth daily before breakfast.    . montelukast (SINGULAIR) 10 MG tablet Take 1 tablet (10 mg total) by mouth daily. (Patient taking differently: Take 10 mg by mouth at bedtime. )   . Multiple Vitamins-Minerals (MULTIVITAMIN GUMMIES WOMENS) CHEW Chew 2 tablets by mouth daily.   . ondansetron (ZOFRAN) 8 MG tablet Take by mouth every 8 (  eight) hours as needed for nausea or vomiting.   . pantoprazole (PROTONIX) 40 MG tablet Take 1 tablet (40 mg total) by mouth as needed.   . pravastatin (PRAVACHOL) 20 MG tablet TAKE 1 TABLET(20 MG) BY MOUTH EVERY NIGHT (Patient taking differently: Take 20 mg by mouth at bedtime. )   . RESTASIS 0.05 % ophthalmic emulsion Place 1 drop into both eyes 2 (two) times daily.   . timolol (TIMOPTIC) 0.25 % ophthalmic solution Place 1 drop into both eyes 2 (two) times daily.    . famotidine (PEPCID) 20 MG tablet Take 1 tablet (20 mg total) by mouth daily. (Patient not  taking: Reported on 01/13/2018)   . methylPREDNISolone (MEDROL DOSEPAK) 4 MG TBPK tablet follow package directions (Patient not taking: Reported on 01/24/2018) 01/24/2018: Completed    No facility-administered encounter medications on file as of 01/24/2018.     Functional Status:   In your present state of health, do you have any difficulty performing the following activities: 01/24/2018 01/13/2018  Hearing? N N  Vision? N N  Difficulty concentrating or making decisions? N N  Walking or climbing stairs? N N  Comment some difficulty with steps  -  Dressing or bathing? N N  Doing errands, shopping? Y N  Comment family helps  -  Conservation officer, nature and eating ? N -  Using the Toilet? N -  In the past six months, have you accidently leaked urine? N -  Do you have problems with loss of bowel control? N -  Managing your Medications? N -  Managing your Finances? N -  Housekeeping or managing your Housekeeping? N -  Comment family helps  -  Some recent data might be hidden    Fall/Depression Screening:    Fall Risk  01/24/2018 10/26/2017 04/16/2017  Falls in the past year? 0 No No   PHQ 2/9 Scores 01/24/2018 10/26/2017 04/16/2017  PHQ - 2 Score 0 0 0    Assessment:  Initial home visit. Patient is active with [facility] home health.  Hypertension- Taking medications as prescribed, reports not adding salt to diet. Patient has blood pressure monitor in home unsure of accuracy , has wrist and arm automatic monitors, checked manual readings against , wrist and arm monitors, arm blood pressure reading closer in comparison to manual reading.Reinforced not use wrist blood pressure monitor. Patient denies dizziness or weakness. Will benefit from education and support to self care management of Hypertension .  Heart Failure /Dilated cardiomyopathy - Denies shortness of breath or swelling , in review of Heart zone patient identifies being in green zone. Will benefit from continued education and support of Heart  failure self care management with zone identification .  Mobility/ safety  Participating in home health therapy sessions , improved gait and strength. Improvement in gout discomfort right hand. Patient ambulating indenpentanly in home, uses cane at times.   Advanced Directives Patient does not have in place, will provide packet with review on completion.   Plan:  Provided welcome packet and review with [facility] consent obtained Provided High blood pressure [facility]N book Provided [facility] calendar and Living with heart failure book Will send PCP this initial home visit note Will plan return call in the next 3 weeks.   THN CM Care Plan Problem One     Most Recent Value  Care Plan Problem One  Knowledge deficit related to CHF action plan as evidenced by recent hospital admission   Role Documenting the Problem One  Care Management Coordinator  Care Plan for Problem One  Active  THN Long Term Goal   Over the next 90 days patient will be able to verbalize the plan of action for managing heart failure over the next 60 days    THN Long Term Goal Start Date  01/18/18  Interventions for Problem One Long Term Goal  Home visit completed,   [facility] CM Short Term Goal #1   Patient will be able to report weighing daily and keeping a record over the next 30 days   THN CM Short Term Goal #1 Start Date  01/18/18  Interventions for Short Term Goal #1  Reviewed in Living with heart failure book provided, best time of day to weigh , and importance of keeping a record to help identify sudden weight gains.   THN CM Short Term Goal #2   Patient will keep all scheduled medical appointments over the next 30 days   THN CM Short Term Goal #2 Start Date  01/18/18  Interventions for Short Term Goal #2  Discussed post hospital visit at PCP office , and next appointment encouraged keeping all visit .    THN CM Short Term Goal #3  Patient will be able to verbalize action plan for the yellow zone over the next 30 days   THN CM Short Term  Goal #3 Start Date  01/24/18  Interventions for Short Tern Goal #3  Reviewed zone chart, emphasized importande of knowing how she feels each day, reviewed action plan .     THN CM Care Plan Problem Two     Most Recent Value  Care Plan Problem Two  Knowledge related to self care management of Hypertension as evidenced by recent hospital admission for hypertensive crisis.   Role Documenting the Problem Two  Care Management [place] for Problem Two  Active  Interventions for Problem Two Long Term Goal   Reviewed EMMI on controlling blood pressure through lifestyle , importance of self monitoring, low choices food, taking medications. Reviewed normal and elevated blood pressure readings  .   THN Long Term Goal  Patient will be able to report increase knowlede of seff care managment of hypertension over the next 60 days   THN Long Term Goal Start Date  01/24/18  [facility] CM Short Term Goal #1   Patietn will be able to verbalize checking blood pressure daily and keeping a record over the next 30 days   THN CM Short Term Goal #1 Start Date  01/18/18  Interventions for Short Term Goal #2   Completed comparsion of wrist and arm blood pressure manual , review of paitent demonstartion of use of arm BP monitor with reinforcement . provided [facility] calendar and how to document    THN CM Short Term Goal #2   Over the next 30 days patient will be able to verbalize high salt food to avoid    THN CM Short Term Goal #2 Start Date  01/24/18  Interventions for Short Term Goal #2  THN blood pressure book reviewed list of high salt foods to limit in diet and rationale.       Joylene Draft, RN, [place] Management Coordinator  [phone]- Mobile [phone]- Toll Free Main Office

## 2018-01-25 ENCOUNTER — Ambulatory Visit (INDEPENDENT_AMBULATORY_CARE_PROVIDER_SITE_OTHER): Payer: Medicare Other | Admitting: Adult Health

## 2018-01-25 ENCOUNTER — Telehealth: Payer: Self-pay

## 2018-01-25 ENCOUNTER — Encounter: Payer: Self-pay | Admitting: Adult Health

## 2018-01-25 VITALS — BP 142/78 | HR 63 | Temp 98.0°F | Resp 16 | Ht <= 58 in | Wt 136.0 lb

## 2018-01-25 DIAGNOSIS — I110 Hypertensive heart disease with heart failure: Secondary | ICD-10-CM | POA: Diagnosis not present

## 2018-01-25 DIAGNOSIS — R0602 Shortness of breath: Secondary | ICD-10-CM

## 2018-01-25 DIAGNOSIS — I5042 Chronic combined systolic (congestive) and diastolic (congestive) heart failure: Secondary | ICD-10-CM | POA: Diagnosis not present

## 2018-01-25 DIAGNOSIS — J9691 Respiratory failure, unspecified with hypoxia: Secondary | ICD-10-CM | POA: Diagnosis not present

## 2018-01-25 DIAGNOSIS — I10 Essential (primary) hypertension: Secondary | ICD-10-CM

## 2018-01-25 DIAGNOSIS — M810 Age-related osteoporosis without current pathological fracture: Secondary | ICD-10-CM | POA: Diagnosis not present

## 2018-01-25 DIAGNOSIS — F4310 Post-traumatic stress disorder, unspecified: Secondary | ICD-10-CM | POA: Diagnosis not present

## 2018-01-25 DIAGNOSIS — J449 Chronic obstructive pulmonary disease, unspecified: Secondary | ICD-10-CM | POA: Diagnosis not present

## 2018-01-25 NOTE — Patient Instructions (Signed)

## 2018-01-25 NOTE — Telephone Encounter (Signed)
Gave verbal order to [phone]  for  Nursing for 2 times a week for 2 week and 1 times for 7 weeks

## 2018-01-25 NOTE — Progress Notes (Signed)
[facility] [place], [place] [address]  Internal MEDICINE  Office Visit Note  Patient Name: Stacey [name]  [id number]  [id number]  Date of Service: 01/25/2018  Chief Complaint  Patient presents with  . Hypertension     HPI Pt is here for a sick visit. Pt reports when the home health nurse came today her bp was 190's/90.  The nurse told her to make an appt with her PCP. At this visit her BP 142/78.  Patient reports her home health nurse was concerned because she will not be coming back out to see her she wanted to get her blood pressure seen by her provider.  Stacey [name] has recently released from the hospital where she had a hypertensive crisis and we are working with her to keep her blood pressure down.  She is currently taking carvedilol, hydralazine, and clonidine.  The hospital stopped her losartan and patient is wondering if she should restart that at this time.  Based on her blood pressure in the office I told her to hold off on that.  She will be keeping a log of her blood pressures for her next visit.     Current Medication:  Outpatient Encounter Medications as of 01/25/2018  Medication Sig Note  . pantoprazole (PROTONIX) 40 MG tablet Take 1 tablet (40 mg total) by mouth as needed.   Marland Kitchen albuterol (VENTOLIN HFA) 108 (90 Base) MCG/ACT inhaler Inhale 2 puffs into the lungs every 6 (six) hours as needed for wheezing or shortness of breath.   . carvedilol (COREG) 6.25 MG tablet Take 1 tablet (6.25 mg total) by mouth 2 (two) times daily with a meal.   . cetirizine (ZYRTEC) 10 MG tablet Take 1 tablet (10 mg total) by mouth daily. (Patient taking differently: Take 10 mg by mouth at bedtime. )   . Cholecalciferol (VITAMIN D3) 3000 units TABS Take 3,000 Units by mouth daily.    . cinacalcet (SENSIPAR) 30 MG tablet Take 30 mg by mouth once a week.    . clobetasol ointment (TEMOVATE) 3.26 % Apply 1 application topically 2 (two) times daily as needed (rash).    .  cloNIDine (CATAPRES) 0.1 MG tablet Take 1 tablet (0.1 mg total) by mouth 2 (two) times daily.   . colchicine 0.6 MG tablet Take 1 tablet (0.6 mg total) by mouth daily.   . fluticasone (FLOVENT HFA) 110 MCG/ACT inhaler Take 2 puffs at bed time (Patient taking differently: Inhale 2 puffs into the lungs at bedtime as needed (shortness of breath). )   . furosemide (LASIX) 20 MG tablet Take 2 tablets (40 mg total) by mouth daily. (Patient taking differently: Take 20 mg by mouth daily. )   . hydrALAZINE (APRESOLINE) 50 MG tablet Take 2 tablets (100 mg total) by mouth 3 (three) times daily.   Marland Kitchen latanoprost (XALATAN) 0.005 % ophthalmic solution Place 1 drop into both eyes at bedtime.    Marland Kitchen levothyroxine (SYNTHROID, LEVOTHROID) 88 MCG tablet Take 88 mcg by mouth daily before breakfast.    . montelukast (SINGULAIR) 10 MG tablet Take 1 tablet (10 mg total) by mouth daily. (Patient taking differently: Take 10 mg by mouth at bedtime. )   . Multiple Vitamins-Minerals (MULTIVITAMIN GUMMIES WOMENS) CHEW Chew 2 tablets by mouth daily.   . ondansetron (ZOFRAN) 8 MG tablet Take by mouth every 8 (eight) hours as needed for nausea or vomiting.   . pravastatin (PRAVACHOL) 20 MG tablet TAKE 1 TABLET(20 MG) BY MOUTH EVERY  NIGHT (Patient taking differently: Take 20 mg by mouth at bedtime. )   . RESTASIS 0.05 % ophthalmic emulsion Place 1 drop into both eyes 2 (two) times daily.   . timolol (TIMOPTIC) 0.25 % ophthalmic solution Place 1 drop into both eyes 2 (two) times daily.    . [DISCONTINUED] famotidine (PEPCID) 20 MG tablet Take 1 tablet (20 mg total) by mouth daily. (Patient not taking: Reported on 01/13/2018)   . [DISCONTINUED] methylPREDNISolone (MEDROL DOSEPAK) 4 MG TBPK tablet follow package directions (Patient not taking: Reported on 01/24/2018) 01/24/2018: Completed    No facility-administered encounter medications on file as of 01/25/2018.       Medical History: Past Medical History:  Diagnosis Date  .  Hypertension   . Thyroid disease      Vital Signs: BP (!) 142/78 (BP Location: Left Arm, Patient Position: Sitting, Cuff Size: Normal)   Pulse 63   Temp 98 F (36.7 C) (Oral)   Resp 16   Ht 4\' 9"  (1.448 m)   Wt 136 lb (61.7 kg)   SpO2 98%   BMI 29.43 kg/m    Review of Systems  Constitutional: Negative for chills, fatigue and unexpected weight change.  HENT: Negative for congestion, rhinorrhea, sneezing and sore throat.   Eyes: Negative for photophobia, pain and redness.  Respiratory: Negative for cough, chest tightness and shortness of breath.   Cardiovascular: Negative for chest pain and palpitations.  Gastrointestinal: Negative for abdominal pain, constipation, diarrhea, nausea and vomiting.  Endocrine: Negative.   Genitourinary: Negative for dysuria and frequency.  Musculoskeletal: Negative for arthralgias, back pain, joint swelling and neck pain.  Skin: Negative for rash.  Allergic/Immunologic: Negative.   Neurological: Negative for tremors and numbness.  Hematological: Negative for adenopathy. Does not bruise/bleed easily.  Psychiatric/Behavioral: Negative for behavioral problems and sleep disturbance. The patient is not nervous/anxious.     Physical Exam  Constitutional: She is oriented to person, place, and time. She appears well-developed and well-nourished. No distress.  HENT:  Head: Normocephalic and atraumatic.  Mouth/Throat: Oropharynx is clear and moist. No oropharyngeal exudate.  Eyes: Pupils are equal, round, and reactive to light. EOM are normal.  Neck: Normal range of motion. Neck supple. No JVD present. No tracheal deviation present. No thyromegaly present.  Cardiovascular: Normal rate, regular rhythm and normal heart sounds. Exam reveals no gallop and no friction rub.  No murmur heard. Pulmonary/Chest: Effort normal and breath sounds normal. No respiratory distress. She has no wheezes. She has no rales. She exhibits no tenderness.  Abdominal: Soft.  There is no tenderness. There is no guarding.  Musculoskeletal: Normal range of motion.  Lymphadenopathy:    She has no cervical adenopathy.  Neurological: She is alert and oriented to person, place, and time. No cranial nerve deficit.  Skin: Skin is warm and dry. She is not diaphoretic.  Psychiatric: She has a normal mood and affect. Her behavior is normal. Judgment and thought content normal.  Nursing note and vitals reviewed.  Assessment/Plan: 1. Essential hypertension Blood pressure appears to in office today.  Patient will keep a log of blood pressures for next visit and will call if she consistently has a blood pressure that is greater than 150 over 90s.  She states that she was doing housework putting weight close when the health nurse arrived.  She is afraid her pressures have been up because of that and the home health nurse did not give her enough time to rest.  2. SOBOE (shortness of breath  on exertion) She continues to report shortness of breath with exertion this is not new for her.   General Counseling: avianna moynahan understanding of the findings of todays visit and agrees with plan of treatment. I have discussed any further diagnostic evaluation that may be needed or ordered today. We also reviewed her medications today. she has been encouraged to call the office with any questions or concerns that should arise related to todays visit.   No orders of the defined types were placed in this encounter.   No orders of the defined types were placed in this encounter.   Time spent: 25 Minutes  This patient was seen by Orson Gear AGNP-C in Collaboration with Dr [name] as a part of collaborative care agreement.  Kendell Bane AGNP-C Internal Medicine

## 2018-01-28 ENCOUNTER — Other Ambulatory Visit: Payer: Self-pay | Admitting: Oncology

## 2018-01-28 DIAGNOSIS — F4310 Post-traumatic stress disorder, unspecified: Secondary | ICD-10-CM | POA: Diagnosis not present

## 2018-01-28 DIAGNOSIS — J449 Chronic obstructive pulmonary disease, unspecified: Secondary | ICD-10-CM | POA: Diagnosis not present

## 2018-01-28 DIAGNOSIS — M810 Age-related osteoporosis without current pathological fracture: Secondary | ICD-10-CM | POA: Diagnosis not present

## 2018-01-28 DIAGNOSIS — J9691 Respiratory failure, unspecified with hypoxia: Secondary | ICD-10-CM | POA: Diagnosis not present

## 2018-01-28 DIAGNOSIS — I110 Hypertensive heart disease with heart failure: Secondary | ICD-10-CM | POA: Diagnosis not present

## 2018-01-28 DIAGNOSIS — I5042 Chronic combined systolic (congestive) and diastolic (congestive) heart failure: Secondary | ICD-10-CM | POA: Diagnosis not present

## 2018-01-30 DIAGNOSIS — J9691 Respiratory failure, unspecified with hypoxia: Secondary | ICD-10-CM | POA: Diagnosis not present

## 2018-01-30 DIAGNOSIS — F4310 Post-traumatic stress disorder, unspecified: Secondary | ICD-10-CM | POA: Diagnosis not present

## 2018-01-30 DIAGNOSIS — M810 Age-related osteoporosis without current pathological fracture: Secondary | ICD-10-CM | POA: Diagnosis not present

## 2018-01-30 DIAGNOSIS — I5042 Chronic combined systolic (congestive) and diastolic (congestive) heart failure: Secondary | ICD-10-CM | POA: Diagnosis not present

## 2018-01-30 DIAGNOSIS — J449 Chronic obstructive pulmonary disease, unspecified: Secondary | ICD-10-CM | POA: Diagnosis not present

## 2018-01-30 DIAGNOSIS — I110 Hypertensive heart disease with heart failure: Secondary | ICD-10-CM | POA: Diagnosis not present

## 2018-02-12 ENCOUNTER — Other Ambulatory Visit: Payer: Self-pay | Admitting: *Deleted

## 2018-02-12 NOTE — Patient Outreach (Signed)
[place] [facility]) Care Management  02/12/2018  Stacey [name] [birth year] [id number]   Telephone follow up call   Initial Telephone assessment   Referral received : 01/17/18  Referral source: Primary Navigator  Referral reason : Education and support on chronic medical conditions.  Insurance : Medicare  Patient with inpatient admission at [facility] 10/27-10/30,  Dx: Hypertensive crisis, Acute respiratory failure, Heart failure.  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 Heart failure , Hypertension.    Successful outreach call to patient , she reports that she is doing pretty good, getting her strength back.  Patient further discussed :  Hypertension  Patient discussed she continues to monitor her blood pressure reports improved readings after restarting Lorsartan since PCP visit.  Patient report limiting salt in her diet.  Patient continues working on exercises provided by home health .   Diastolic heart failure  Patient reports today's weight is 135, and ranging the same daily no sudden weight gain, denies shortness of breath . RN continues to reinforce education on Heart failure zone and action plan.   Patient discussed cataract eye surgery coming up in the next 2 weeks.   Patient denies any new concerns at this time.    Plan  Will plan follow up call in the next 3 weeks.   THN CM Care Plan Problem One     Most Recent Value  Care Plan Problem One  Knowledge deficit related to CHF action plan as evidenced by recent hospital admission   Role Documenting the Problem One  Care Management [place] for Problem One  Active  [facility] Long Term Goal    patient will be able to verbalize the plan of action for managing heart failure over the next 60 days    THN Long Term Goal Start Date  01/18/18  Interventions for Problem One Long Term Goal  Reviewed with teachback yellow zone symptoms of heart failure   THN  CM Short Term Goal #1   Patient will be able to report weighing daily and keeping a record over the next 30 days   THN CM Short Term Goal #1 Start Date  01/18/18  [facility] CM Short Term Goal #1 Met Date  02/12/18  THN CM Short Term Goal #2   Patient will keep all scheduled medical appointments over the next 30 days   THN CM Short Term Goal #2 Start Date  01/18/18  [facility] CM Short Term Goal #2 Met Date  02/12/18  THN CM Short Term Goal #3  Patient will be able to verbalize action plan for the yellow zone over the next 30 days   THN CM Short Term Goal #3 Start Date  01/24/18  Interventions for Short Tern Goal #3  Reinforced action plan on yellow zone symptoms, reviewed worsening symptoms of CHF and notifying MD sooner     [facility] CM Care Plan Problem Two     Most Recent Value  Care Plan Problem Two  Knowledge related to self care management of Hypertension as evidenced by recent hospital admission for hypertensive crisis.   Role Documenting the Problem Two  Care Management Coordinator  Care Plan for Problem Two  Active  Interventions for Problem Two Long Term Goal   Reviewed current clinical state,reinforced notifiying MD sooner for concerns new symtoms to arrange office visit.   THN Long Term Goal  Patient will be able to report increase knowlede of seff care managment of hypertension over the next  60 days   THN Long Term Goal Start Date  01/24/18  THN CM Short Term Goal #1   Patietn will be able to verbalize checking blood pressure daily and keeping a record over the next 30 days   THN CM Short Term Goal #1 Start Date  01/18/18  [facility] CM Short Term Goal #1 Met Date   02/12/18  Interventions for Short Term Goal #2   Review recent reading and encouraged to continue to monitor and keep a record   THN CM Short Term Goal #2   Over the next 30 days patient will be able to verbalize high salt food to avoid    THN CM Short Term Goal #2 Start Date  01/24/18  Interventions for Short Term Goal #2  Reviewed high salt  foods to limit in diet, discussed moderation on processed foods during holiday season , and how salt in foods causing body to hold on to water and may cause swelling.       Joylene Draft, RN, [place] Management Coordinator  [phone]- Mobile [phone]- Toll Free Main Office

## 2018-02-13 ENCOUNTER — Ambulatory Visit: Payer: Self-pay | Admitting: *Deleted

## 2018-02-25 DIAGNOSIS — H25811 Combined forms of age-related cataract, right eye: Secondary | ICD-10-CM | POA: Diagnosis not present

## 2018-03-06 ENCOUNTER — Other Ambulatory Visit: Payer: Self-pay | Admitting: *Deleted

## 2018-03-06 NOTE — Patient Outreach (Signed)
[place] [facility]) Care Management  03/06/2018  SHAKENDRA GRIFFETH Mar 01, 1946 [id number]   Telephone follow up call    Referral received : 01/17/18  Referral source: Primary Navigator  Referral reason : Education and support on chronic medical conditions.  Insurance : Medicare  Patient with inpatient admission at [facility] 10/27-10/30,  Dx: Hypertensive crisis, Acute respiratory failure, Heart failure.  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 Heart failure , Hypertension.    Successful outreach call to patient , she reports that she is doing well at home. She shared having recent left eye cataract surgery and has follow up appointment, she reports that it went okay.   She further discussed:  Heart failure She continues to weigh daily , weight remains at 136. Discussed current zone with review, she identifies being in the green zone. . Patient continues to limit salt in diet. Reinforced zone symptoms and action plan   Hypertension Patient continues to monitor blood pressure daily, taking medications as prescribed and limiting salt in diet.  Patient reports recent readings in range of 140/, denies reading over 150/90. Review of daily salt in diet of 2000 mg , patient endorses understanding reading labels, for salt content, reviewed foods with hidden salt , processed foods.   Patient denies any new concerns at this time.  Plan  Will plan follow call in the next month.   THN CM Care Plan Problem One     Most Recent Value  Care Plan Problem One  Knowledge deficit related to CHF action plan as evidenced by recent hospital admission   Role Documenting the Problem One  Care Management [place] for Problem One  Active  [facility] Long Term Goal    Patient will be able to verbalize the plan of action for managing heart failure over the next 75 days    THN Long Term Goal Start Date  01/18/18  Interventions for Problem One  Long Term Goal  Discussed current clinical state, current zone state,identified., reinforced notifying MD of worsening yellow zone symptoms. Reviewed measures of living daily with heart failure , taking medications, knowing zone she is in daily, being active, limiting salt in diet.   THN CM Short Term Goal #1   Patient will be able to report weighing daily and keeping a record over the next 30 days   THN CM Short Term Goal #1 Start Date  01/18/18  [facility] CM Short Term Goal #1 Met Date  02/12/18  THN CM Short Term Goal #2   Patient will keep all scheduled medical appointments over the next 30 days   THN CM Short Term Goal #2 Start Date  01/18/18  [facility] CM Short Term Goal #2 Met Date  02/12/18  THN CM Short Term Goal #3  Patient will be able to verbalize action plan for the yellow zone over the next 30 days   THN CM Short Term Goal #3 Start Date  01/24/18  [facility] CM Short Term Goal #3 Met Date  03/06/18 [late entry ]  Interventions for Short Tern Goal #3  Reviewed with teachback    [facility] CM Care Plan Problem Two     Most Recent Value  Care Plan Problem Two  Knowledge related to self care management of Hypertension as evidenced by recent hospital admission for hypertensive crisis.   Role Documenting the Problem Two  Care Management [place] for Problem Two  Active  Interventions for Problem Two Long Term  Goal   Discussed recent blood pressure readings, reinforced adherence to limiting salt, continuing to monitor blood pressure and notifying MD of consistent elevations over 140/  THN Long Term Goal  Patient will be able to report increase knowlede of seff care managment of hypertension over the next 60 days   THN Long Term Goal Start Date  01/24/18  [facility] CM Short Term Goal #1   Patietnt will be able to verbalize checking blood pressure daily and keeping a record over the next 30 days   THN CM Short Term Goal #1 Start Date  01/18/18  [facility] CM Short Term Goal #1 Met Date   02/12/18  THN CM Short  Term Goal #2   Over the next 30 days patient will be able to verbalize high salt food to avoid    [facility] CM Short Term Goal #2 Start Date  01/24/18  [facility] CM Short Term Goal #2 Met Date  03/06/18      Joylene Draft, RN, [place] Management Coordinator  [phone]- Mobile [phone]- [place]

## 2018-03-11 DIAGNOSIS — E210 Primary hyperparathyroidism: Secondary | ICD-10-CM | POA: Diagnosis not present

## 2018-03-11 DIAGNOSIS — E039 Hypothyroidism, unspecified: Secondary | ICD-10-CM | POA: Diagnosis not present

## 2018-03-11 DIAGNOSIS — M810 Age-related osteoporosis without current pathological fracture: Secondary | ICD-10-CM | POA: Diagnosis not present

## 2018-03-18 DIAGNOSIS — E039 Hypothyroidism, unspecified: Secondary | ICD-10-CM | POA: Insufficient documentation

## 2018-03-18 DIAGNOSIS — N183 Chronic kidney disease, stage 3 unspecified: Secondary | ICD-10-CM | POA: Insufficient documentation

## 2018-03-18 DIAGNOSIS — M810 Age-related osteoporosis without current pathological fracture: Secondary | ICD-10-CM | POA: Diagnosis not present

## 2018-03-18 DIAGNOSIS — E210 Primary hyperparathyroidism: Secondary | ICD-10-CM | POA: Diagnosis not present

## 2018-04-01 DIAGNOSIS — H25812 Combined forms of age-related cataract, left eye: Secondary | ICD-10-CM | POA: Diagnosis not present

## 2018-04-03 ENCOUNTER — Ambulatory Visit: Payer: Self-pay | Admitting: *Deleted

## 2018-04-04 ENCOUNTER — Other Ambulatory Visit: Payer: Self-pay | Admitting: *Deleted

## 2018-04-04 NOTE — Patient Outreach (Signed)
[place] [facility]) Care Management  04/04/2018  Stacey [name] 1945/05/12 [id number]  Telephone outreach call    Referral received : 01/17/18  Referral source: Primary Navigator  Referral reason : Education and support on chronic medical conditions.  Insurance : Medicare  Patient with inpatient admission at [facility] 10/27-10/30,  Dx: Hypertensive crisis, Acute respiratory failure, Heart failure.  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 Heart failure , Hypertension.   Successful outreach call to patient ,she discussed recent eye surgery , left eye  on this week and right eye in last week  Patient discussed some discomfort but improving has follow up visit on tomorrow , voiced concern with blurriness of right eye that will addressed at visit also.  Hypertension Continues to monitor blood pressures, ranging 140/ after surgery, but yesterday back down to 130. Discussed salt intake and limits, she reports limiting salt, processed foods and not adding salt .   Heart failure  Continues to monitor weight daily , review of worsening symptoms, denies weight gain, shortness of breath or swelling. Daily weight still at 136, no sudden weight gain. Patient continues to take medications as prescribed.   Plan  Will plan follow up call in the next month  Discussed telephonic health coach program for disease management of chronic conditions, plan for transition in the next month if no new community care coordination needs.    Joylene Draft, RN, [place] Management Coordinator  [phone]- Mobile [phone]- Toll Free Main Office

## 2018-04-29 ENCOUNTER — Other Ambulatory Visit: Payer: Self-pay | Admitting: *Deleted

## 2018-04-29 NOTE — Patient Outreach (Signed)
[place] [facility]) Care Management  04/29/2018  Stacey [name] 1945/12/14 [id number]   Referral received : 01/17/18  Referral source: Primary Navigator  Referral reason : Education and support on chronic medical conditions.  Insurance : Medicare  Patient with inpatient admission at [facility] 10/27-10/30,  Dx: Hypertensive crisis, Acute respiratory failure, Heart failure.   Successful outreach call to patient , she reports that she is doing well. She discussed recent eye surgeries and follow up visit in the next 6 weeks.   Patient further discussed :  Hypertension: Report that she continues to monitor blood pressure daily, recent reading 136/70, patient denies recent elevations greater than 140/. Patient able to verbalize elevated readings to notify MD of. Patient continues to limit salt in diet, understands label readings and limiting salt to 2 grams a day.  Heart failure  Continues to monitor weights daily current weight 136, no sudden weight gain, shortness of breath or swelling. Patient able to identify worsening symptoms to notify MD .   Patient denies any new concerns or nursing care management needs at this time. Patient reports that she is managing well at home, taking medications as prescribed, attending all MD visit. Patient declines  need for further follow up at this time as previously discussed regarding health coach for continued telephone follow up for chronic conditions .   Patient verbalizing having contact number for [facility] if new concerns arise.   Plan Will close case, goals met. Will send PCP case closure letter.    Joylene Draft, RN, [place] Management Coordinator  [phone]- Mobile [phone]- Toll Free Main Office

## 2018-05-06 ENCOUNTER — Ambulatory Visit (INDEPENDENT_AMBULATORY_CARE_PROVIDER_SITE_OTHER): Payer: Medicare Other | Admitting: Nurse Practitioner

## 2018-05-06 ENCOUNTER — Encounter: Payer: Self-pay | Admitting: Nurse Practitioner

## 2018-05-06 VITALS — BP 169/91 | HR 79 | Resp 16 | Ht <= 58 in | Wt 140.0 lb

## 2018-05-06 DIAGNOSIS — D869 Sarcoidosis, unspecified: Secondary | ICD-10-CM | POA: Diagnosis not present

## 2018-05-06 DIAGNOSIS — I10 Essential (primary) hypertension: Secondary | ICD-10-CM | POA: Diagnosis not present

## 2018-05-06 DIAGNOSIS — J309 Allergic rhinitis, unspecified: Secondary | ICD-10-CM | POA: Diagnosis not present

## 2018-05-06 DIAGNOSIS — J069 Acute upper respiratory infection, unspecified: Secondary | ICD-10-CM

## 2018-05-06 MED ORDER — LOSARTAN POTASSIUM 100 MG PO TABS: 100.0000 mg | ORAL_TABLET | Freq: Every day | ORAL | 3 refills | Status: DC

## 2018-05-06 MED ORDER — AMOXICILLIN 875 MG PO TABS: 875.0000 mg | ORAL_TABLET | Freq: Two times a day (BID) | ORAL | 0 refills | Status: DC

## 2018-05-06 MED ORDER — CLONIDINE HCL 0.1 MG PO TABS: 0.1000 mg | ORAL_TABLET | Freq: Two times a day (BID) | ORAL | 4 refills | Status: DC

## 2018-05-06 MED ORDER — MONTELUKAST SODIUM 10 MG PO TABS: 10.0000 mg | ORAL_TABLET | Freq: Every day | ORAL | 3 refills | Status: DC

## 2018-05-06 NOTE — Progress Notes (Signed)
[facility] [place], [place] [address]  Internal MEDICINE  Office Visit Note  Patient Name: Stacey [name]  [id number]  [id number]  Date of Service: 05/06/2018  Chief Complaint  Patient presents with  . Medical Management of Chronic Issues    6 month follow up  . Hypertension    The patient is here for routine follow up visit. Blood pressure very elevated today. Has been out of "some" of her medications. Specifically, she has been out of clonidine. She states that she has been out of this for at least two weeks.  Had cataracts removed since her last visit. Had one eye done in November and the other in December. Continues to have some persistent eye watering. States that she is still using drops to help reduce pressure. Will follow up with eye doctor March 17.  Chronic allergies. Moderate nasal congestion. Has also been out of singulair for some time.       Current Medication: Outpatient Encounter Medications as of 05/06/2018  Medication Sig  . albuterol (VENTOLIN HFA) 108 (90 Base) MCG/ACT inhaler Inhale 2 puffs into the lungs every 6 (six) hours as needed for wheezing or shortness of breath.  Marland Kitchen amoxicillin (AMOXIL) 875 MG tablet Take 1 tablet (875 mg total) by mouth 2 (two) times daily.  . carvedilol (COREG) 6.25 MG tablet Take 1 tablet (6.25 mg total) by mouth 2 (two) times daily with a meal.  . cetirizine (ZYRTEC) 10 MG tablet Take 1 tablet (10 mg total) by mouth daily. (Patient taking differently: Take 10 mg by mouth at bedtime. )  . Cholecalciferol (VITAMIN D3) 3000 units TABS Take 3,000 Units by mouth daily.   . cinacalcet (SENSIPAR) 30 MG tablet Take 30 mg by mouth once a week.   . clobetasol ointment (TEMOVATE) 7.03 % Apply 1 application topically 2 (two) times daily as needed (rash).   . cloNIDine (CATAPRES) 0.1 MG tablet Take 1 tablet (0.1 mg total) by mouth 2 (two) times daily.  . colchicine 0.6 MG tablet Take 1 tablet (0.6 mg total) by mouth  daily.  . fluticasone (FLOVENT HFA) 110 MCG/ACT inhaler Take 2 puffs at bed time (Patient taking differently: Inhale 2 puffs into the lungs at bedtime as needed (shortness of breath). )  . furosemide (LASIX) 20 MG tablet Take 2 tablets (40 mg total) by mouth daily. (Patient taking differently: Take 20 mg by mouth daily. )  . hydrALAZINE (APRESOLINE) 50 MG tablet Take 2 tablets (100 mg total) by mouth 3 (three) times daily.  Marland Kitchen latanoprost (XALATAN) 0.005 % ophthalmic solution Place 1 drop into both eyes at bedtime.   Marland Kitchen levothyroxine (SYNTHROID, LEVOTHROID) 88 MCG tablet Take 88 mcg by mouth daily before breakfast.   . losartan (COZAAR) 100 MG tablet Take 1 tablet (100 mg total) by mouth daily.  . montelukast (SINGULAIR) 10 MG tablet Take 1 tablet (10 mg total) by mouth daily.  . Multiple Vitamins-Minerals (MULTIVITAMIN GUMMIES WOMENS) CHEW Chew 2 tablets by mouth daily.  . ondansetron (ZOFRAN) 8 MG tablet Take by mouth every 8 (eight) hours as needed for nausea or vomiting.  . pantoprazole (PROTONIX) 40 MG tablet Take 1 tablet (40 mg total) by mouth as needed.  . pravastatin (PRAVACHOL) 20 MG tablet TAKE 1 TABLET(20 MG) BY MOUTH EVERY NIGHT (Patient taking differently: Take 20 mg by mouth at bedtime. )  . RESTASIS 0.05 % ophthalmic emulsion Place 1 drop into both eyes 2 (two) times daily.  . timolol (TIMOPTIC)  0.25 % ophthalmic solution Place 1 drop into both eyes 2 (two) times daily.   . [DISCONTINUED] cloNIDine (CATAPRES) 0.1 MG tablet Take 1 tablet (0.1 mg total) by mouth 2 (two) times daily.  . [DISCONTINUED] montelukast (SINGULAIR) 10 MG tablet Take 1 tablet (10 mg total) by mouth daily. (Patient taking differently: Take 10 mg by mouth at bedtime. )   No facility-administered encounter medications on file as of 05/06/2018.     Surgical History: Past Surgical History:  Procedure Laterality Date  . GALLBLADDER SURGERY    . KNEE SURGERY    . thyroidectomy      Medical History: Past  Medical History:  Diagnosis Date  . Hypertension   . Thyroid disease     Family History: Family History  Problem Relation Age of Onset  . Hypertension Mother   . Diabetes Mother   . Anuerysm Mother   . Diabetes Father   . Hypertension Father     Social History   Socioeconomic History  . Marital status: Widowed    Spouse name: Not on file  . Number of children: Not on file  . Years of education: Not on file  . Highest education level: Not on file  Occupational History  . Not on file  Social Needs  . Financial resource strain: Not on file  . Food insecurity:    Worry: Not on file    Inability: Not on file  . Transportation needs:    Medical: Not on file    Non-medical: Not on file  Tobacco Use  . Smoking status: Never Smoker  . Smokeless tobacco: Never Used  Substance and Sexual Activity  . Alcohol use: No  . Drug use: Not Currently    Types: "Crack" cocaine  . Sexual activity: Not on file  Lifestyle  . Physical activity:    Days per week: Not on file    Minutes per session: Not on file  . Stress: Not on file  Relationships  . Social connections:    Talks on phone: Not on file    Gets together: Not on file    Attends religious service: Not on file    Active member of club or organization: Not on file    Attends meetings of clubs or organizations: Not on file    Relationship status: Not on file  . Intimate partner violence:    Fear of current or ex partner: Not on file    Emotionally abused: Not on file    Physically abused: Not on file    Forced sexual activity: Not on file  Other Topics Concern  . Not on file  Social History Narrative  . Not on file      Review of Systems  Constitutional: Positive for fatigue. Negative for activity change, appetite change, chills, fever and unexpected weight change.  HENT: Positive for congestion, postnasal drip, rhinorrhea and sinus pressure. Negative for sinus pain, sneezing and sore throat.   Eyes: Positive for  discharge. Negative for redness.       Moderate eye watering.   Respiratory: Positive for cough and wheezing. Negative for apnea, chest tightness and shortness of breath.   Cardiovascular: Negative for chest pain and palpitations.       Elevated blood pressure today. Out of clonidine .  Gastrointestinal: Negative for abdominal pain, constipation, diarrhea, nausea and vomiting.  Endocrine: Negative for cold intolerance, heat intolerance, polydipsia and polyuria.       Hypothyroid/hypoparathyroid disease. Does see endocrinology.  Musculoskeletal: Negative for arthralgias, back pain, joint swelling, neck pain and neck stiffness.  Skin: Negative for color change and rash.  Allergic/Immunologic: Positive for environmental allergies.  Neurological: Positive for headaches. Negative for dizziness, tremors and numbness.  Hematological: Negative for adenopathy. Does not bruise/bleed easily.  Psychiatric/Behavioral: Negative for behavioral problems (Depression), dysphoric mood, sleep disturbance and suicidal ideas. The patient is not nervous/anxious.     Today's Vitals   05/06/18 0923  BP: (!) 169/91  Pulse: 79  Resp: 16  SpO2: 95%  Weight: 140 lb (63.5 kg)  Height: 4\' 9"  (1.448 m)    Physical Exam Vitals signs and nursing note reviewed.  Constitutional:      General: She is not in acute distress.    Appearance: Normal appearance. She is well-developed. She is not diaphoretic.  HENT:     Head: Normocephalic and atraumatic.     Nose: Congestion present.     Right Sinus: Maxillary sinus tenderness and frontal sinus tenderness present.     Left Sinus: Maxillary sinus tenderness and frontal sinus tenderness present.     Mouth/Throat:     Pharynx: No oropharyngeal exudate.  Eyes:     Pupils: Pupils are equal, round, and reactive to light.  Neck:     Musculoskeletal: Normal range of motion and neck supple.     Thyroid: No thyromegaly.     Vascular: No carotid bruit or JVD.     Trachea: No  tracheal deviation.  Cardiovascular:     Rate and Rhythm: Normal rate and regular rhythm.     Heart sounds: Murmur present. No friction rub. No gallop.   Pulmonary:     Effort: Pulmonary effort is normal. No respiratory distress.     Breath sounds: Wheezing present. No rales.     Comments: Mild, expiratory wheezing Chest:     Chest wall: No tenderness.  Abdominal:     General: Bowel sounds are normal.     Palpations: Abdomen is soft.  Musculoskeletal: Normal range of motion.  Lymphadenopathy:     Cervical: No cervical adenopathy.  Skin:    General: Skin is warm and dry.  Neurological:     Mental Status: She is alert and oriented to person, place, and time.     Cranial Nerves: No cranial nerve deficit.  Psychiatric:        Behavior: Behavior normal.        Thought Content: Thought content normal.        Judgment: Judgment normal.   Assessment/Plan: 1. Acute upper respiratory infection Start amoxicillin 875mg  twice daily for 10 days. Rest and increase fluids. Recommend OTC medication to improve symptoms.  - amoxicillin (AMOXIL) 875 MG tablet; Take 1 tablet (875 mg total) by mouth 2 (two) times daily.  Dispense: 20 tablet; Refill: 0  2. Allergic rhinitis, unspecified seasonality, unspecified trigger Continue allergy medications as prescribed. Add back singulair every day. New prescription sent to her pharmacy.  - montelukast (SINGULAIR) 10 MG tablet; Take 1 tablet (10 mg total) by mouth daily.  Dispense: 90 tablet; Refill: 3  3. Essential hypertension Continue all blood pressure medications as prescribed. Refills sent to the pharmacy.  - cloNIDine (CATAPRES) 0.1 MG tablet; Take 1 tablet (0.1 mg total) by mouth 2 (two) times daily.  Dispense: 180 tablet; Refill: 4 - losartan (COZAAR) 100 MG tablet; Take 1 tablet (100 mg total) by mouth daily.  Dispense: 90 tablet; Refill: 3  4. Sarcoidosis Continue regular visits with pulmonology as scheduled  General Counseling: adiva boettner understanding of the findings of todays visit and agrees with plan of treatment. I have discussed any further diagnostic evaluation that may be needed or ordered today. We also reviewed her medications today. she has been encouraged to call the office with any questions or concerns that should arise related to todays visit.  Hypertension Counseling:   The following hypertensive lifestyle modification were recommended and discussed:  1. Limiting alcohol intake to less than 1 oz/day of ethanol:(24 oz of beer or 8 oz of wine or 2 oz of 100-proof whiskey). 2. Take baby ASA 81 mg daily. 3. Importance of regular aerobic exercise and losing weight. 4. Reduce dietary saturated fat and cholesterol intake for overall cardiovascular health. 5. Maintaining adequate dietary potassium, calcium, and magnesium intake. 6. Regular monitoring of the blood pressure. 7. Reduce sodium intake to less than 100 mmol/day (less than 2.3 gm of sodium or less than 6 gm of sodium choride)   Counseling: Adherence of Medical Therapy: The patient understands that it is the responsibility of the patient to complete all prescribed medications, all recommended testing, including but not limited to, laboratory studies and imaging. The patient further understands the need to keep all scheduled follow-up visits and to inform the office immediately of any changes in their medical condition. The patient understands that the success of treatment in large part depends on the patient's willingness to complete the therapeutic regimen and to work in partnership with the designated health-care providers.   This patient was seen by [place] with Dr [name] as a part of collaborative care agreement  Meds ordered this encounter  Medications  . cloNIDine (CATAPRES) 0.1 MG tablet    Sig: Take 1 tablet (0.1 mg total) by mouth 2 (two) times daily.    Dispense:  180 tablet    Refill:  4    Please fill as 90  day prescription.    Order Specific Question:   Supervising Provider    Answer:   [name] [[id number]]  . losartan (COZAAR) 100 MG tablet    Sig: Take 1 tablet (100 mg total) by mouth daily.    Dispense:  90 tablet    Refill:  3    Order Specific Question:   Supervising Provider    Answer:   [name] [[id number]]  . montelukast (SINGULAIR) 10 MG tablet    Sig: Take 1 tablet (10 mg total) by mouth daily.    Dispense:  90 tablet    Refill:  3    Order Specific Question:   Supervising Provider    Answer:   [name] [[id number]]  . amoxicillin (AMOXIL) 875 MG tablet    Sig: Take 1 tablet (875 mg total) by mouth 2 (two) times daily.    Dispense:  20 tablet    Refill:  0    Order Specific Question:   Supervising Provider    Answer:   [name] [[id number]]    Time spent: 61 Minutes      Dr [name] Internal medicine

## 2018-05-06 NOTE — Progress Notes (Signed)
Checked pt blood pressure two times due to it being elevated.

## 2018-05-14 DIAGNOSIS — M810 Age-related osteoporosis without current pathological fracture: Secondary | ICD-10-CM | POA: Diagnosis not present

## 2018-06-05 ENCOUNTER — Other Ambulatory Visit: Payer: Self-pay

## 2018-06-05 MED ORDER — PRAVASTATIN SODIUM 20 MG PO TABS: ORAL_TABLET | ORAL | 1 refills | Status: DC

## 2018-06-07 ENCOUNTER — Telehealth: Payer: Self-pay

## 2018-06-07 ENCOUNTER — Other Ambulatory Visit: Payer: Self-pay | Admitting: Nurse Practitioner

## 2018-06-07 DIAGNOSIS — J069 Acute upper respiratory infection, unspecified: Secondary | ICD-10-CM

## 2018-06-07 MED ORDER — CEFUROXIME AXETIL 500 MG PO TABS: 500.0000 mg | ORAL_TABLET | Freq: Two times a day (BID) | ORAL | 0 refills | Status: DC

## 2018-06-07 NOTE — Telephone Encounter (Signed)
Pt was notified.  

## 2018-06-07 NOTE — Progress Notes (Signed)
Patient still having symptoms of uri after treatment with amoxicillin 85mg  bid for 10 days. Will do round ceftin 500,g twice daily for 10 days. New prescription sent to [facility].

## 2018-06-07 NOTE — Telephone Encounter (Signed)
Patient still having symptoms of uri after treatment with amoxicillin 85mg  bid for 10 days. Will do round ceftin 500,g twice daily for 10 days. New prescription sent to [facility].

## 2018-06-13 DIAGNOSIS — I10 Essential (primary) hypertension: Secondary | ICD-10-CM | POA: Diagnosis not present

## 2018-06-13 DIAGNOSIS — E782 Mixed hyperlipidemia: Secondary | ICD-10-CM | POA: Diagnosis not present

## 2018-06-13 DIAGNOSIS — I420 Dilated cardiomyopathy: Secondary | ICD-10-CM | POA: Diagnosis not present

## 2018-06-13 DIAGNOSIS — I6523 Occlusion and stenosis of bilateral carotid arteries: Secondary | ICD-10-CM | POA: Diagnosis not present

## 2018-06-13 DIAGNOSIS — R0602 Shortness of breath: Secondary | ICD-10-CM | POA: Diagnosis not present

## 2018-06-13 DIAGNOSIS — D8685 Sarcoid myocarditis: Secondary | ICD-10-CM | POA: Diagnosis not present

## 2018-07-12 DIAGNOSIS — Z961 Presence of intraocular lens: Secondary | ICD-10-CM | POA: Diagnosis not present

## 2018-07-12 DIAGNOSIS — H04123 Dry eye syndrome of bilateral lacrimal glands: Secondary | ICD-10-CM | POA: Diagnosis not present

## 2018-07-12 DIAGNOSIS — H33313 Horseshoe tear of retina without detachment, bilateral: Secondary | ICD-10-CM | POA: Diagnosis not present

## 2018-07-12 DIAGNOSIS — H35353 Cystoid macular degeneration, bilateral: Secondary | ICD-10-CM | POA: Diagnosis not present

## 2018-07-22 DIAGNOSIS — H209 Unspecified iridocyclitis: Secondary | ICD-10-CM | POA: Diagnosis not present

## 2018-07-22 DIAGNOSIS — D8685 Sarcoid myocarditis: Secondary | ICD-10-CM | POA: Diagnosis not present

## 2018-07-22 DIAGNOSIS — Z79899 Other long term (current) drug therapy: Secondary | ICD-10-CM | POA: Diagnosis not present

## 2018-07-22 DIAGNOSIS — N183 Chronic kidney disease, stage 3 (moderate): Secondary | ICD-10-CM | POA: Diagnosis not present

## 2018-07-22 LAB — HM HEPATITIS C SCREENING LAB: HM Hepatitis Screen: NEGATIVE

## 2018-08-15 DIAGNOSIS — H33313 Horseshoe tear of retina without detachment, bilateral: Secondary | ICD-10-CM | POA: Diagnosis not present

## 2018-08-15 DIAGNOSIS — H04123 Dry eye syndrome of bilateral lacrimal glands: Secondary | ICD-10-CM | POA: Diagnosis not present

## 2018-08-15 DIAGNOSIS — H35353 Cystoid macular degeneration, bilateral: Secondary | ICD-10-CM | POA: Diagnosis not present

## 2018-08-15 DIAGNOSIS — Z961 Presence of intraocular lens: Secondary | ICD-10-CM | POA: Diagnosis not present

## 2018-09-06 ENCOUNTER — Other Ambulatory Visit: Payer: Self-pay

## 2018-09-06 DIAGNOSIS — R601 Generalized edema: Secondary | ICD-10-CM

## 2018-09-06 MED ORDER — FUROSEMIDE 20 MG PO TABS: 40.0000 mg | ORAL_TABLET | Freq: Every day | ORAL | 2 refills | Status: DC

## 2018-09-11 DIAGNOSIS — E210 Primary hyperparathyroidism: Secondary | ICD-10-CM | POA: Diagnosis not present

## 2018-09-11 DIAGNOSIS — E039 Hypothyroidism, unspecified: Secondary | ICD-10-CM | POA: Diagnosis not present

## 2018-09-11 DIAGNOSIS — M810 Age-related osteoporosis without current pathological fracture: Secondary | ICD-10-CM | POA: Diagnosis not present

## 2018-09-14 ENCOUNTER — Other Ambulatory Visit: Payer: Self-pay | Admitting: Family Medicine

## 2018-09-14 DIAGNOSIS — Z20822 Contact with and (suspected) exposure to covid-19: Secondary | ICD-10-CM

## 2018-09-14 DIAGNOSIS — R6889 Other general symptoms and signs: Secondary | ICD-10-CM | POA: Diagnosis not present

## 2018-09-18 DIAGNOSIS — E210 Primary hyperparathyroidism: Secondary | ICD-10-CM | POA: Diagnosis not present

## 2018-09-18 DIAGNOSIS — E039 Hypothyroidism, unspecified: Secondary | ICD-10-CM | POA: Diagnosis not present

## 2018-09-18 DIAGNOSIS — M810 Age-related osteoporosis without current pathological fracture: Secondary | ICD-10-CM | POA: Diagnosis not present

## 2018-09-18 LAB — NOVEL CORONAVIRUS, NAA: SARS-CoV-2, NAA: NOT DETECTED

## 2018-09-23 DIAGNOSIS — H33313 Horseshoe tear of retina without detachment, bilateral: Secondary | ICD-10-CM | POA: Diagnosis not present

## 2018-09-23 DIAGNOSIS — H04123 Dry eye syndrome of bilateral lacrimal glands: Secondary | ICD-10-CM | POA: Diagnosis not present

## 2018-09-23 DIAGNOSIS — H35353 Cystoid macular degeneration, bilateral: Secondary | ICD-10-CM | POA: Diagnosis not present

## 2018-09-23 DIAGNOSIS — Z961 Presence of intraocular lens: Secondary | ICD-10-CM | POA: Diagnosis not present

## 2018-10-28 DIAGNOSIS — H33313 Horseshoe tear of retina without detachment, bilateral: Secondary | ICD-10-CM | POA: Diagnosis not present

## 2018-10-28 DIAGNOSIS — H35353 Cystoid macular degeneration, bilateral: Secondary | ICD-10-CM | POA: Diagnosis not present

## 2018-10-28 DIAGNOSIS — H04123 Dry eye syndrome of bilateral lacrimal glands: Secondary | ICD-10-CM | POA: Diagnosis not present

## 2018-10-28 DIAGNOSIS — Z961 Presence of intraocular lens: Secondary | ICD-10-CM | POA: Diagnosis not present

## 2018-10-31 ENCOUNTER — Encounter: Payer: Self-pay | Admitting: Nurse Practitioner

## 2018-10-31 ENCOUNTER — Ambulatory Visit (INDEPENDENT_AMBULATORY_CARE_PROVIDER_SITE_OTHER): Payer: Medicare Other | Admitting: Nurse Practitioner

## 2018-10-31 ENCOUNTER — Other Ambulatory Visit: Payer: Self-pay

## 2018-10-31 VITALS — BP 161/75 | HR 53 | Temp 96.9°F | Resp 16 | Ht <= 58 in | Wt 145.0 lb

## 2018-10-31 DIAGNOSIS — J449 Chronic obstructive pulmonary disease, unspecified: Secondary | ICD-10-CM

## 2018-10-31 DIAGNOSIS — E039 Hypothyroidism, unspecified: Secondary | ICD-10-CM

## 2018-10-31 DIAGNOSIS — Z0001 Encounter for general adult medical examination with abnormal findings: Secondary | ICD-10-CM

## 2018-10-31 DIAGNOSIS — R300 Dysuria: Secondary | ICD-10-CM

## 2018-10-31 DIAGNOSIS — I10 Essential (primary) hypertension: Secondary | ICD-10-CM

## 2018-10-31 DIAGNOSIS — R109 Unspecified abdominal pain: Secondary | ICD-10-CM | POA: Diagnosis not present

## 2018-10-31 DIAGNOSIS — Z1239 Encounter for other screening for malignant neoplasm of breast: Secondary | ICD-10-CM | POA: Diagnosis not present

## 2018-10-31 MED ORDER — ALBUTEROL SULFATE HFA 108 (90 BASE) MCG/ACT IN AERS: 2.0000 | INHALATION_SPRAY | Freq: Four times a day (QID) | RESPIRATORY_TRACT | 3 refills | Status: DC | PRN

## 2018-10-31 NOTE — Patient Instructions (Signed)
Calorie Counting for Weight Loss Calories are units of energy. Your body needs a certain amount of calories from food to keep you going throughout the day. When you eat more calories than your body needs, your body stores the extra calories as fat. When you eat fewer calories than your body needs, your body burns fat to get the energy it needs. Calorie counting means keeping track of how many calories you eat and drink each day. Calorie counting can be helpful if you need to lose weight. If you make sure to eat fewer calories than your body needs, you should lose weight. Ask your health care provider what a healthy weight is for you. For calorie counting to work, you will need to eat the right number of calories in a day in order to lose a healthy amount of weight per week. A dietitian can help you determine how many calories you need in a day and will give you suggestions on how to reach your calorie goal.  A healthy amount of weight to lose per week is usually 1-2 lb (0.5-0.9 kg). This usually means that your daily calorie intake should be reduced by 500-750 calories.  Eating 1,200 - 1,500 calories per day can help most women lose weight.  Eating 1,500 - 1,800 calories per day can help most men lose weight. What is my plan? My goal is to have __________ calories per day. If I have this many calories per day, I should lose around __________ pounds per week. What do I need to know about calorie counting? In order to meet your daily calorie goal, you will need to:  Find out how many calories are in each food you would like to eat. Try to do this before you eat.  Decide how much of the food you plan to eat.  Write down what you ate and how many calories it had. Doing this is called keeping a food log. To successfully lose weight, it is important to balance calorie counting with a healthy lifestyle that includes regular activity. Aim for 150 minutes of moderate exercise (such as walking) or 75  minutes of vigorous exercise (such as running) each week. Where do I find calorie information?  The number of calories in a food can be found on a Nutrition Facts label. If a food does not have a Nutrition Facts label, try to look up the calories online or ask your dietitian for help. Remember that calories are listed per serving. If you choose to have more than one serving of a food, you will have to multiply the calories per serving by the amount of servings you plan to eat. For example, the label on a package of bread might say that a serving size is 1 slice and that there are 90 calories in a serving. If you eat 1 slice, you will have eaten 90 calories. If you eat 2 slices, you will have eaten 180 calories. How do I keep a food log? Immediately after each meal, record the following information in your food log:  What you ate. Don't forget to include toppings, sauces, and other extras on the food.  How much you ate. This can be measured in cups, ounces, or number of items.  How many calories each food and drink had.  The total number of calories in the meal. Keep your food log near you, such as in a small notebook in your pocket, or use a mobile app or website. Some programs will calculate   calories for you and show you how many calories you have left for the day to meet your goal. What are some calorie counting tips?   Use your calories on foods and drinks that will fill you up and not leave you hungry: ? Some examples of foods that fill you up are nuts and nut butters, vegetables, lean proteins, and high-fiber foods like whole grains. High-fiber foods are foods with more than 5 g fiber per serving. ? Drinks such as sodas, specialty coffee drinks, alcohol, and juices have a lot of calories, yet do not fill you up.  Eat nutritious foods and avoid empty calories. Empty calories are calories you get from foods or beverages that do not have many vitamins or protein, such as candy, sweets, and  soda. It is better to have a nutritious high-calorie food (such as an avocado) than a food with few nutrients (such as a bag of chips).  Know how many calories are in the foods you eat most often. This will help you calculate calorie counts faster.  Pay attention to calories in drinks. Low-calorie drinks include water and unsweetened drinks.  Pay attention to nutrition labels for "low fat" or "fat free" foods. These foods sometimes have the same amount of calories or more calories than the full fat versions. They also often have added sugar, starch, or salt, to make up for flavor that was removed with the fat.  Find a way of tracking calories that works for you. Get creative. Try different apps or programs if writing down calories does not work for you. What are some portion control tips?  Know how many calories are in a serving. This will help you know how many servings of a certain food you can have.  Use a measuring cup to measure serving sizes. You could also try weighing out portions on a kitchen scale. With time, you will be able to estimate serving sizes for some foods.  Take some time to put servings of different foods on your favorite plates, bowls, and cups so you know what a serving looks like.  Try not to eat straight from a bag or box. Doing this can lead to overeating. Put the amount you would like to eat in a cup or on a plate to make sure you are eating the right portion.  Use smaller plates, glasses, and bowls to prevent overeating.  Try not to multitask (for example, watch TV or use your computer) while eating. If it is time to eat, sit down at a table and enjoy your food. This will help you to know when you are full. It will also help you to be aware of what you are eating and how much you are eating. What are tips for following this plan? Reading food labels  Check the calorie count compared to the serving size. The serving size may be smaller than what you are used to  eating.  Check the source of the calories. Make sure the food you are eating is high in vitamins and protein and low in saturated and trans fats. Shopping  Read nutrition labels while you shop. This will help you make healthy decisions before you decide to purchase your food.  Make a grocery list and stick to it. Cooking  Try to cook your favorite foods in a healthier way. For example, try baking instead of frying.  Use low-fat dairy products. Meal planning  Use more fruits and vegetables. Half of your plate should be fruits   and vegetables.  Include lean proteins like poultry and fish. How do I count calories when eating out?  Ask for smaller portion sizes.  Consider sharing an entree and sides instead of getting your own entree.  If you get your own entree, eat only half. Ask for a box at the beginning of your meal and put the rest of your entree in it so you are not tempted to eat it.  If calories are listed on the menu, choose the lower calorie options.  Choose dishes that include vegetables, fruits, whole grains, low-fat dairy products, and lean protein.  Choose items that are boiled, broiled, grilled, or steamed. Stay away from items that are buttered, battered, fried, or served with cream sauce. Items labeled "crispy" are usually fried, unless stated otherwise.  Choose water, low-fat milk, unsweetened iced tea, or other drinks without added sugar. If you want an alcoholic beverage, choose a lower calorie option such as a glass of wine or light beer.  Ask for dressings, sauces, and syrups on the side. These are usually high in calories, so you should limit the amount you eat.  If you want a salad, choose a garden salad and ask for grilled meats. Avoid extra toppings like bacon, cheese, or fried items. Ask for the dressing on the side, or ask for olive oil and vinegar or lemon to use as dressing.  Estimate how many servings of a food you are given. For example, a serving of  cooked rice is  cup or about the size of half a baseball. Knowing serving sizes will help you be aware of how much food you are eating at restaurants. The list below tells you how big or small some common portion sizes are based on everyday objects: ? 1 oz-4 stacked dice. ? 3 oz-1 deck of cards. ? 1 tsp-1 die. ? 1 Tbsp- a ping-pong ball. ? 2 Tbsp-1 ping-pong ball. ?  cup- baseball. ? 1 cup-1 baseball. Summary  Calorie counting means keeping track of how many calories you eat and drink each day. If you eat fewer calories than your body needs, you should lose weight.  A healthy amount of weight to lose per week is usually 1-2 lb (0.5-0.9 kg). This usually means reducing your daily calorie intake by 500-750 calories.  The number of calories in a food can be found on a Nutrition Facts label. If a food does not have a Nutrition Facts label, try to look up the calories online or ask your dietitian for help.  Use your calories on foods and drinks that will fill you up, and not on foods and drinks that will leave you hungry.  Use smaller plates, glasses, and bowls to prevent overeating. This information is not intended to replace advice given to you by your health care provider. Make sure you discuss any questions you have with your health care provider. Document Released: 03/06/2005 Document Revised: 11/23/2017 Document Reviewed: 02/04/2016 Elsevier Patient Education  2020 [employer].  Exercising to Lose Weight Exercise is structured, repetitive physical activity to improve fitness and health. Getting regular exercise is important for everyone. It is especially important if you are overweight. Being overweight increases your risk of heart disease, stroke, diabetes, high blood pressure, and several types of cancer. Reducing your calorie intake and exercising can help you lose weight. Exercise is usually categorized as moderate or vigorous intensity. To lose weight, most people need to do a  certain amount of moderate-intensity or vigorous-intensity exercise each week. Moderate-intensity  exercise  Moderate-intensity exercise is any activity that gets you moving enough to burn at least three times more energy (calories) than if you were sitting. Examples of moderate exercise include:  Walking a mile in 15 minutes.  Doing light yard work.  Biking at an easy pace. Most people should get at least 150 minutes (2 hours and 30 minutes) a week of moderate-intensity exercise to maintain their body weight. Vigorous-intensity exercise Vigorous-intensity exercise is any activity that gets you moving enough to burn at least six times more calories than if you were sitting. When you exercise at this intensity, you should be working hard enough that you are not able to carry on a conversation. Examples of vigorous exercise include:  Running.  Playing a team sport, such as football, basketball, and soccer.  Jumping rope. Most people should get at least 75 minutes (1 hour and 15 minutes) a week of vigorous-intensity exercise to maintain their body weight. How can exercise affect me? When you exercise enough to burn more calories than you eat, you lose weight. Exercise also reduces body fat and builds muscle. The more muscle you have, the more calories you burn. Exercise also:  Improves mood.  Reduces stress and tension.  Improves your overall fitness, flexibility, and endurance.  Increases bone strength. The amount of exercise you need to lose weight depends on:  Your age.  The type of exercise.  Any health conditions you have.  Your overall physical ability. Talk to your health care provider about how much exercise you need and what types of activities are safe for you. What actions can I take to lose weight? Nutrition   Make changes to your diet as told by your health care provider or diet and nutrition specialist (dietitian). This may include: ? Eating fewer calories. ?  Eating more protein. ? Eating less unhealthy fats. ? Eating a diet that includes fresh fruits and vegetables, whole grains, low-fat dairy products, and lean protein. ? Avoiding foods with added fat, salt, and sugar.  Drink plenty of water while you exercise to prevent dehydration or heat stroke. Activity  Choose an activity that you enjoy and set realistic goals. Your health care provider can help you make an exercise plan that works for you.  Exercise at a moderate or vigorous intensity most days of the week. ? The intensity of exercise may vary from person to person. You can tell how intense a workout is for you by paying attention to your breathing and heartbeat. Most people will notice their breathing and heartbeat get faster with more intense exercise.  Do resistance training twice each week, such as: ? Push-ups. ? Sit-ups. ? Lifting weights. ? Using resistance bands.  Getting short amounts of exercise can be just as helpful as long structured periods of exercise. If you have trouble finding time to exercise, try to include exercise in your daily routine. ? Get up, stretch, and walk around every 30 minutes throughout the day. ? Go for a walk during your lunch break. ? Park your car farther away from your destination. ? If you take public transportation, get off one stop early and walk the rest of the way. ? Make phone calls while standing up and walking around. ? Take the stairs instead of elevators or escalators.  Wear comfortable clothes and shoes with good support.  Do not exercise so much that you hurt yourself, feel dizzy, or get very short of breath. Where to find more information  [place] Department of  Health and Human Services: [employer]  Centers for Disease Control and Prevention (CDC): http://www.wolf.info/ Contact a health care provider:  Before starting a new exercise program.  If you have questions or concerns about your weight.  If you have a medical problem that  keeps you from exercising. Get help right away if you have any of the following while exercising:  Injury.  Dizziness.  Difficulty breathing or shortness of breath that does not go away when you stop exercising.  Chest pain.  Rapid heartbeat. Summary  Being overweight increases your risk of heart disease, stroke, diabetes, high blood pressure, and several types of cancer.  Losing weight happens when you burn more calories than you eat.  Reducing the amount of calories you eat in addition to getting regular moderate or vigorous exercise each week helps you lose weight. This information is not intended to replace advice given to you by your health care provider. Make sure you discuss any questions you have with your health care provider. Document Released: 04/08/2010 Document Revised: 03/19/2017 Document Reviewed: 03/19/2017 Elsevier Patient Education  2020 [employer].

## 2018-10-31 NOTE — Progress Notes (Signed)
[facility] [place],  [address]  Internal MEDICINE  Office Visit Note  Patient Name: Stacey [name]  [id number]  [id number]  Date of Service: 11/03/2018   Pt is here for routine health maintenance examination   Chief Complaint  Patient presents with  . Annual Exam  . Hypertension  . Hypothyroidism  . Hyperlipidemia  . Cough    at night  . Shortness of Breath    on wednesday night only has not happpened  . Weight Gain    pt would like to discuss weight gain     The patient is here for health maintenance exam. Blood pressure is mildly elevated. She does see cardiology regularly. She took medication shortly before coming to this appointment.  She is having nighttime cough. Cough is mostly after she lays down for sleep. She does have history of allergies. Currently taking syrtec and Singulair every day. She also has acid reflux for which she takes pantoprazole. Admits she has not been taking this every day. Takes it periodically.  Tuesday night, had asthma attack. Tried to use rescue inhaler, however, medicine was old and ineffective.  She is having intermittent right flank pain. She denies pain with urination or blood in the urine. She does have history of kidney stones and renal dysfunction. She was going to have lithotripsy at one point, but stone resolved. This pain feels very similar to that she has had in the past.    Current Medication: Outpatient Encounter Medications as of 10/31/2018  Medication Sig  . albuterol (VENTOLIN HFA) 108 (90 Base) MCG/ACT inhaler Inhale 2 puffs into the lungs every 6 (six) hours as needed for wheezing or shortness of breath.  . carvedilol (COREG) 6.25 MG tablet Take 1 tablet (6.25 mg total) by mouth 2 (two) times daily with a meal.  . cetirizine (ZYRTEC) 10 MG tablet Take 1 tablet (10 mg total) by mouth daily. (Patient taking differently: Take 10 mg by mouth at bedtime. )  . Cholecalciferol (VITAMIN D3) 3000  units TABS Take 3,000 Units by mouth daily.   . cinacalcet (SENSIPAR) 30 MG tablet Take 30 mg by mouth once a week.   . clobetasol ointment (TEMOVATE) 7.35 % Apply 1 application topically 2 (two) times daily as needed (rash).   . cloNIDine (CATAPRES) 0.1 MG tablet Take 1 tablet (0.1 mg total) by mouth 2 (two) times daily.  . colchicine 0.6 MG tablet Take 1 tablet (0.6 mg total) by mouth daily.  . fluticasone (FLOVENT HFA) 110 MCG/ACT inhaler Take 2 puffs at bed time (Patient taking differently: Inhale 2 puffs into the lungs at bedtime as needed (shortness of breath). )  . furosemide (LASIX) 20 MG tablet Take 2 tablets (40 mg total) by mouth daily.  . hydrALAZINE (APRESOLINE) 50 MG tablet Take 2 tablets (100 mg total) by mouth 3 (three) times daily.  Marland Kitchen latanoprost (XALATAN) 0.005 % ophthalmic solution Place 1 drop into both eyes at bedtime.   Marland Kitchen levothyroxine (SYNTHROID, LEVOTHROID) 88 MCG tablet Take 88 mcg by mouth daily before breakfast.   . losartan (COZAAR) 100 MG tablet Take 1 tablet (100 mg total) by mouth daily.  . montelukast (SINGULAIR) 10 MG tablet Take 1 tablet (10 mg total) by mouth daily.  . Multiple Vitamins-Minerals (MULTIVITAMIN GUMMIES WOMENS) CHEW Chew 2 tablets by mouth daily.  . ondansetron (ZOFRAN) 8 MG tablet Take by mouth every 8 (eight) hours as needed for nausea or vomiting.  . pantoprazole (PROTONIX) 40 MG tablet  Take 1 tablet (40 mg total) by mouth as needed.  . pravastatin (PRAVACHOL) 20 MG tablet TAKE 1 TABLET(20 MG) BY MOUTH EVERY NIGHT  . RESTASIS 0.05 % ophthalmic emulsion Place 1 drop into both eyes 2 (two) times daily.  . timolol (TIMOPTIC) 0.25 % ophthalmic solution Place 1 drop into both eyes 2 (two) times daily.   . [DISCONTINUED] albuterol (VENTOLIN HFA) 108 (90 Base) MCG/ACT inhaler Inhale 2 puffs into the lungs every 6 (six) hours as needed for wheezing or shortness of breath.  . [DISCONTINUED] cefUROXime (CEFTIN) 500 MG tablet Take 1 tablet (500 mg total) by  mouth 2 (two) times daily with a meal.   No facility-administered encounter medications on file as of 10/31/2018.     Surgical History: Past Surgical History:  Procedure Laterality Date  . GALLBLADDER SURGERY    . KNEE SURGERY    . thyroidectomy      Medical History: Past Medical History:  Diagnosis Date  . Hypertension   . Thyroid disease     Family History: Family History  Problem Relation Age of Onset  . Hypertension Mother   . Diabetes Mother   . Anuerysm Mother   . Diabetes Father   . Hypertension Father       Review of Systems  Constitutional: Positive for fatigue. Negative for activity change, appetite change, chills, fever and unexpected weight change.  HENT: Negative for congestion, postnasal drip, rhinorrhea, sinus pressure, sinus pain, sneezing and sore throat.   Eyes: Negative for discharge and redness.  Respiratory: Positive for cough and wheezing. Negative for apnea, chest tightness and shortness of breath.        Intermittent  Cardiovascular: Negative for chest pain and palpitations.       Elevated blood pressure. She does see cardiology for blood pressure/cardiac management.   Gastrointestinal: Negative for abdominal pain, constipation, diarrhea, nausea and vomiting.  Endocrine: Negative for cold intolerance, heat intolerance, polydipsia and polyuria.       Hypothyroid/hypoparathyroid disease. Does see endocrinology.   Genitourinary: Positive for flank pain.       Intermittent right flank pain. History of kidney stones.   Musculoskeletal: Positive for back pain. Negative for arthralgias, joint swelling, neck pain and neck stiffness.  Skin: Negative for color change and rash.  Allergic/Immunologic: Positive for environmental allergies.  Neurological: Positive for headaches. Negative for dizziness, tremors and numbness.  Hematological: Negative for adenopathy. Does not bruise/bleed easily.  Psychiatric/Behavioral: Negative for behavioral problems  (Depression), dysphoric mood, sleep disturbance and suicidal ideas. The patient is not nervous/anxious.     Today's Vitals   10/31/18 1006  BP: (!) 161/75  Pulse: (!) 53  Resp: 16  Temp: (!) 96.9 F (36.1 C)  SpO2: 97%  Weight: 145 lb (65.8 kg)  Height: 4\' 9"  (1.448 m)   Body mass index is 31.38 kg/m.  Physical Exam Vitals signs and nursing note reviewed.  Constitutional:      Appearance: Normal appearance. She is well-developed.  HENT:     Head: Normocephalic and atraumatic.     Right Ear: External ear normal. Tympanic membrane is not erythematous or bulging.     Left Ear: External ear normal.     Nose: Nose normal. No rhinorrhea.     Right Sinus: No frontal sinus tenderness.     Left Sinus: No frontal sinus tenderness.  Eyes:     Conjunctiva/sclera: Conjunctivae normal.     Pupils: Pupils are equal, round, and reactive to light.  Neck:  Musculoskeletal: Normal range of motion and neck supple.     Thyroid: No thyromegaly.     Vascular: No carotid bruit or JVD.     Trachea: No tracheal deviation.  Cardiovascular:     Rate and Rhythm: Normal rate and regular rhythm.     Pulses: Normal pulses.     Heart sounds: S1 normal and S2 normal. Murmur present. Systolic murmur present with a grade of 2/6.  Pulmonary:     Effort: Pulmonary effort is normal. No respiratory distress.     Breath sounds: Normal breath sounds. No wheezing.  Chest:     Breasts:        Right: Normal. No swelling, bleeding, inverted nipple, mass, nipple discharge, skin change or tenderness.        Left: Normal. No swelling, bleeding, inverted nipple, mass, nipple discharge, skin change or tenderness.  Abdominal:     General: Bowel sounds are normal.     Palpations: Abdomen is soft.     Tenderness: There is no abdominal tenderness.  Genitourinary:    Comments: Right flank tenderness with palpation.  Musculoskeletal: Normal range of motion.  Lymphadenopathy:     Cervical: No cervical adenopathy.   Skin:    General: Skin is warm and dry.     Capillary Refill: Capillary refill takes less than 2 seconds.  Neurological:     Mental Status: She is alert and oriented to person, place, and time.  Psychiatric:        Behavior: Behavior normal.        Thought Content: Thought content normal.        Judgment: Judgment normal.     Depression screen [facility] 2/9 10/31/2018 05/06/2018 01/24/2018 10/26/2017 04/16/2017  Decreased Interest 0 0 0 0 0  Down, Depressed, Hopeless 0 0 0 0 0  PHQ - 2 Score 0 0 0 0 0    Functional Status Survey: Is the patient deaf or have difficulty hearing?: No Does the patient have difficulty seeing, even when wearing glasses/contacts?: Yes Does the patient have difficulty concentrating, remembering, or making decisions?: No Does the patient have difficulty walking or climbing stairs?: Yes(sometimes with walking) Does the patient have difficulty dressing or bathing?: No Does the patient have difficulty doing errands alone such as visiting a doctor's office or shopping?: No  MMSE - [place] Exam 10/31/2018 10/26/2017  Orientation to time 5 5  Orientation to Place 5 5  Registration 3 3  Attention/ Calculation 5 5  Recall 3 3  Language- name 2 objects 2 2  Language- repeat 1 1  Language- follow 3 step command 3 3  Language- read & follow direction 1 1  Write a sentence 1 0  Copy design 1 1  Total score 30 29    Fall Risk  10/31/2018 05/06/2018 01/24/2018 10/26/2017 04/16/2017  Falls in the past year? 0 0 0 No No      LABS: Recent Results (from the past 2160 hour(s))  Novel Coronavirus, NAA ([facility])     Status: None   Collection Time: 09/14/18 12:07 PM  Result Value Ref Range   SARS-CoV-2, NAA Not Detected Not Detected    Comment: This test was developed and its performance characteristics determined by [employer]. This test has not been FDA cleared or approved. This test has been authorized by FDA under an Emergency Use Authorization (EUA).  This test is only authorized for the duration of time the declaration that circumstances exist justifying the authorization of the  emergency use of in vitro diagnostic tests for detection of SARS-CoV-2 virus and/or diagnosis of COVID-19 infection under section 564(b)(1) of the Act, 21 U.S.C. 321YYQ-8(G)(5), unless the authorization is terminated or revoked sooner. When diagnostic testing is negative, the possibility of a false negative result should be considered in the context of a patient's recent exposures and the presence of clinical signs and symptoms consistent with COVID-19. An individual without symptoms of COVID-19 and who is not shedding SARS-CoV-2 virus would expect to have a negative (not detected) result in this assay.   Urinalysis, Routine w reflex microscopic     Status: Abnormal   Collection Time: 10/31/18 10:07 AM  Result Value Ref Range   Specific Gravity, UA 1.012 1.005 - 1.030   pH, UA 6.0 5.0 - 7.5   Color, UA Yellow Yellow   Appearance Ur Clear Clear   Leukocytes,UA Negative Negative   Protein,UA 2+ (A) Negative/Trace   Glucose, UA Negative Negative   Ketones, UA Negative Negative   RBC, UA Negative Negative   Bilirubin, UA Negative Negative   Urobilinogen, Ur 0.2 0.2 - 1.0 mg/dL   Nitrite, UA Negative Negative   Microscopic Examination See below:     Comment: Microscopic was indicated and was performed.  Microscopic Examination     Status: Abnormal   Collection Time: 10/31/18 10:07 AM   URINE  Result Value Ref Range   WBC, UA 0-5 0 - 5 /hpf   RBC 0-2 0 - 2 /hpf   Epithelial Cells (non renal) 0-10 0 - 10 /hpf   Casts None seen None seen /lpf   Mucus, UA Present Not Estab.   Bacteria, UA Moderate (A) None seen/Few   Assessment/Plan: 1. Encounter for general adult medical examination with abnormal findings Annual health maintenance exam today.  2. Acquired hypothyroidism Regular visits with endocrinology as scheduled   3. Chronic obstructive  pulmonary disease, unspecified COPD type ([place]) Continue inhalers and respiratory medication as prescribed. Refill rescue inhaler to use as needed and as prescribed.  - albuterol (VENTOLIN HFA) 108 (90 Base) MCG/ACT inhaler; Inhale 2 puffs into the lungs every 6 (six) hours as needed for wheezing or shortness of breath.  Dispense: 18 g; Refill: 3  4. Right flank pain Right flank pain with history of kidney stones and renal disease. Will get ultrasound of kidneys for further evalation and treatment.  - US Renal; Future  5. Essential hypertension Elevated today. Continue meds as prescribed and follow up with cardiology as scheduled   6. Screening for breast cancer Will get screening mammogram   7. Dysuria - Urinalysis, Routine w reflex microscopic  General Counseling: nala kachel understanding of the findings of todays visit and agrees with plan of treatment. I have discussed any further diagnostic evaluation that may be needed or ordered today. We also reviewed her medications today. she has been encouraged to call the office with any questions or concerns that should arise related to todays visit.    Counseling:  Hypertension Counseling:   The following hypertensive lifestyle modification were recommended and discussed:  1. Limiting alcohol intake to less than 1 oz/day of ethanol:(24 oz of beer or 8 oz of wine or 2 oz of 100-proof whiskey). 2. Take baby ASA 81 mg daily. 3. Importance of regular aerobic exercise and losing weight. 4. Reduce dietary saturated fat and cholesterol intake for overall cardiovascular health. 5. Maintaining adequate dietary potassium, calcium, and magnesium intake. 6. Regular monitoring of the blood pressure. 7. Reduce sodium intake to  less than 100 mmol/day (less than 2.3 gm of sodium or less than 6 gm of sodium choride)    This patient was seen by [place] with Dr [name] as a part of collaborative care  agreement  Orders Placed This Encounter  Procedures  . Microscopic Examination  . US Renal  . Urinalysis, Routine w reflex microscopic    Meds ordered this encounter  Medications  . albuterol (VENTOLIN HFA) 108 (90 Base) MCG/ACT inhaler    Sig: Inhale 2 puffs into the lungs every 6 (six) hours as needed for wheezing or shortness of breath.    Dispense:  18 g    Refill:  3    Order Specific Question:   Supervising Provider    Answer:   [name] [[id number]]    Time spent: [place], MD  Internal Medicine

## 2018-11-01 DIAGNOSIS — Z79899 Other long term (current) drug therapy: Secondary | ICD-10-CM | POA: Diagnosis not present

## 2018-11-01 DIAGNOSIS — D8685 Sarcoid myocarditis: Secondary | ICD-10-CM | POA: Diagnosis not present

## 2018-11-01 DIAGNOSIS — H209 Unspecified iridocyclitis: Secondary | ICD-10-CM | POA: Diagnosis not present

## 2018-11-01 LAB — URINALYSIS, ROUTINE W REFLEX MICROSCOPIC
Bilirubin, UA: NEGATIVE
Glucose, UA: NEGATIVE
Ketones, UA: NEGATIVE
Leukocytes,UA: NEGATIVE
Nitrite, UA: NEGATIVE
RBC, UA: NEGATIVE
Specific Gravity, UA: 1.012 (ref 1.005–1.030)
Urobilinogen, Ur: 0.2 mg/dL (ref 0.2–1.0)
pH, UA: 6 (ref 5.0–7.5)

## 2018-11-01 LAB — MICROSCOPIC EXAMINATION: Casts: NONE SEEN /lpf

## 2018-11-03 DIAGNOSIS — R300 Dysuria: Secondary | ICD-10-CM | POA: Insufficient documentation

## 2018-11-03 DIAGNOSIS — J449 Chronic obstructive pulmonary disease, unspecified: Secondary | ICD-10-CM | POA: Insufficient documentation

## 2018-11-03 DIAGNOSIS — R109 Unspecified abdominal pain: Secondary | ICD-10-CM | POA: Insufficient documentation

## 2018-11-08 ENCOUNTER — Other Ambulatory Visit: Payer: Self-pay

## 2018-11-08 ENCOUNTER — Ambulatory Visit (INDEPENDENT_AMBULATORY_CARE_PROVIDER_SITE_OTHER): Payer: Medicare Other

## 2018-11-08 DIAGNOSIS — R109 Unspecified abdominal pain: Secondary | ICD-10-CM

## 2018-11-11 NOTE — Progress Notes (Signed)
Small kidneys. Left renal cyst. Otherwise looks good. Discuss at visit 9/2

## 2018-11-14 DIAGNOSIS — Z20828 Contact with and (suspected) exposure to other viral communicable diseases: Secondary | ICD-10-CM | POA: Diagnosis not present

## 2018-11-15 DIAGNOSIS — Z20828 Contact with and (suspected) exposure to other viral communicable diseases: Secondary | ICD-10-CM | POA: Diagnosis not present

## 2018-11-21 ENCOUNTER — Other Ambulatory Visit: Payer: Self-pay

## 2018-11-21 ENCOUNTER — Encounter: Payer: Self-pay | Admitting: Nurse Practitioner

## 2018-11-21 ENCOUNTER — Ambulatory Visit (INDEPENDENT_AMBULATORY_CARE_PROVIDER_SITE_OTHER): Payer: Medicare Other | Admitting: Nurse Practitioner

## 2018-11-21 VITALS — BP 155/76 | HR 60 | Resp 16 | Ht <= 58 in | Wt 144.0 lb

## 2018-11-21 DIAGNOSIS — N183 Chronic kidney disease, stage 3 unspecified: Secondary | ICD-10-CM

## 2018-11-21 DIAGNOSIS — E039 Hypothyroidism, unspecified: Secondary | ICD-10-CM

## 2018-11-21 DIAGNOSIS — R109 Unspecified abdominal pain: Secondary | ICD-10-CM

## 2018-11-21 DIAGNOSIS — I10 Essential (primary) hypertension: Secondary | ICD-10-CM

## 2018-11-21 NOTE — Progress Notes (Signed)
[facility] [place], [place] [address]  Internal MEDICINE  Office Visit Note  Patient Name: Stacey [name]  [id number]  [id number]  Date of Service: 12/04/2018  Chief Complaint  Patient presents with  . Follow-up    ultrasound results    The patient is here for follow up of renal ultrasound. She had been having significant flank pain and she has history of renal calculi in the past. The pain is still present and is made worse with exertion. Renal ultrasound showed a small, sub centimeter renal cyst in lower pole of the left kidney. 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 There were no other abnormalities present on the ultrasound.       Current Medication: Outpatient Encounter Medications as of 11/21/2018  Medication Sig  . albuterol (VENTOLIN HFA) 108 (90 Base) MCG/ACT inhaler Inhale 2 puffs into the lungs every 6 (six) hours as needed for wheezing or shortness of breath.  . carvedilol (COREG) 6.25 MG tablet Take 1 tablet (6.25 mg total) by mouth 2 (two) times daily with a meal.  . cetirizine (ZYRTEC) 10 MG tablet Take 1 tablet (10 mg total) by mouth daily. (Patient taking differently: Take 10 mg by mouth at bedtime. )  . Cholecalciferol (VITAMIN D3) 3000 units TABS Take 3,000 Units by mouth daily.   . cinacalcet (SENSIPAR) 30 MG tablet Take 30 mg by mouth once a week.   . clobetasol ointment (TEMOVATE) [id number] % Apply 1 application topically 2 (two) times daily as needed (rash).   . cloNIDine (CATAPRES) 0.1 MG tablet Take 1 tablet (0.1 mg total) by mouth 2 (two) times daily.  . colchicine 0.6 MG tablet Take 1 tablet (0.6 mg total) by mouth daily.  . fluticasone (FLOVENT HFA) 110 MCG/ACT inhaler Take 2 puffs at bed time (Patient taking differently: Inhale 2 puffs into the lungs at bedtime as needed (shortness of breath). )  . furosemide (LASIX) 20 MG tablet Take 2 tablets (40 mg total) by mouth daily.  . hydrALAZINE  (APRESOLINE) 50 MG tablet Take 2 tablets (100 mg total) by mouth 3 (three) times daily.  Marland Kitchen latanoprost (XALATAN) 0.005 % ophthalmic solution Place 1 drop into both eyes at bedtime.   Marland Kitchen levothyroxine (SYNTHROID, LEVOTHROID) 88 MCG tablet Take 88 mcg by mouth daily before breakfast.   . losartan (COZAAR) 100 MG tablet Take 1 tablet (100 mg total) by mouth daily.  . montelukast (SINGULAIR) 10 MG tablet Take 1 tablet (10 mg total) by mouth daily.  . Multiple Vitamins-Minerals (MULTIVITAMIN GUMMIES WOMENS) CHEW Chew 2 tablets by mouth daily.  . ondansetron (ZOFRAN) 8 MG tablet Take by mouth every 8 (eight) hours as needed for nausea or vomiting.  . pantoprazole (PROTONIX) 40 MG tablet Take 1 tablet (40 mg total) by mouth as needed.  . RESTASIS 0.05 % ophthalmic emulsion Place 1 drop into both eyes 2 (two) times daily.  . timolol (TIMOPTIC) 0.25 % ophthalmic solution Place 1 drop into both eyes 2 (two) times daily.   . [DISCONTINUED] pravastatin (PRAVACHOL) 20 MG tablet TAKE 1 TABLET(20 MG) BY MOUTH EVERY NIGHT   No facility-administered encounter medications on file as of 11/21/2018.     Surgical History: Past Surgical History:  Procedure Laterality Date  . GALLBLADDER SURGERY    . KNEE SURGERY    . thyroidectomy      Medical History: Past Medical History:  Diagnosis Date  . Hypertension   . Thyroid disease  Family History: Family History  Problem Relation Age of Onset  . Hypertension Mother   . Diabetes Mother   . Anuerysm Mother   . Diabetes Father   . Hypertension Father     Social History   Socioeconomic History  . Marital status: Widowed    Spouse name: Not on file  . Number of children: Not on file  . Years of education: Not on file  . Highest education level: Not on file  Occupational History  . Not on file  Social Needs  . Financial resource strain: Not on file  . Food insecurity    Worry: Not on file    Inability: Not on file  . Transportation needs     Medical: Not on file    Non-medical: Not on file  Tobacco Use  . Smoking status: Never Smoker  . Smokeless tobacco: Never Used  Substance and Sexual Activity  . Alcohol use: No  . Drug use: Not Currently  . Sexual activity: Not on file  Lifestyle  . Physical activity    Days per week: Not on file    Minutes per session: Not on file  . Stress: Not on file  Relationships  . Social Herbalist on phone: Not on file    Gets together: Not on file    Attends religious service: Not on file    Active member of club or organization: Not on file    Attends meetings of clubs or organizations: Not on file    Relationship status: Not on file  . Intimate partner violence    Fear of current or ex partner: Not on file    Emotionally abused: Not on file    Physically abused: Not on file    Forced sexual activity: Not on file  Other Topics Concern  . Not on file  Social History Narrative  . Not on file      Review of Systems  Constitutional: Negative for activity change, appetite change, chills, fatigue, fever and unexpected weight change.  HENT: Negative for congestion, postnasal drip, rhinorrhea, sinus pressure, sinus pain, sneezing and sore throat.   Eyes: Negative for discharge and redness.  Respiratory: Negative for apnea, cough, chest tightness, shortness of breath and wheezing.   Cardiovascular: Negative for chest pain and palpitations.       Elevated blood pressure. She does see cardiology for blood pressure/cardiac management.   Gastrointestinal: Negative for abdominal pain, constipation, diarrhea, nausea and vomiting.  Endocrine: Negative for cold intolerance, heat intolerance, polydipsia and polyuria.       Hypothyroid/hypoparathyroid disease. Does see endocrinology.   Genitourinary: Positive for flank pain.       Intermittent right flank pain. History of kidney stones.   Musculoskeletal: Positive for back pain. Negative for arthralgias, joint swelling, neck pain and  neck stiffness.  Skin: Negative for color change and rash.  Allergic/Immunologic: Positive for environmental allergies.  Neurological: Positive for headaches. Negative for dizziness, tremors and numbness.  Hematological: Negative for adenopathy. Does not bruise/bleed easily.  Psychiatric/Behavioral: Negative for behavioral problems (Depression), dysphoric mood, sleep disturbance and suicidal ideas. The patient is not nervous/anxious.     Today's Vitals   11/21/18 1630  BP: (!) 155/76  Pulse: 60  Resp: 16  SpO2: 98%  Weight: 144 lb (65.3 kg)  Height: 4\' 9"  (1.448 m)   Body mass index is 31.16 kg/m.  Physical Exam Vitals signs and nursing note reviewed.  Constitutional:  General: She is not in acute distress.    Appearance: Normal appearance. She is well-developed. She is not diaphoretic.  HENT:     Head: Normocephalic and atraumatic.     Mouth/Throat:     Pharynx: No oropharyngeal exudate.  Eyes:     Pupils: Pupils are equal, round, and reactive to light.  Neck:     Musculoskeletal: Normal range of motion and neck supple.     Thyroid: No thyromegaly.     Vascular: No JVD.     Trachea: No tracheal deviation.  Cardiovascular:     Rate and Rhythm: Normal rate and regular rhythm.     Heart sounds: Normal heart sounds. No murmur. No friction rub. No gallop.   Pulmonary:     Effort: Pulmonary effort is normal. No respiratory distress.     Breath sounds: Normal breath sounds. No wheezing or rales.  Chest:     Chest wall: No tenderness.  Abdominal:     Palpations: Abdomen is soft.  Musculoskeletal: Normal range of motion.  Lymphadenopathy:     Cervical: No cervical adenopathy.  Skin:    General: Skin is warm and dry.  Neurological:     Mental Status: She is alert and oriented to person, place, and time.     Cranial Nerves: No cranial nerve deficit.  Psychiatric:        Behavior: Behavior normal.        Thought Content: Thought content normal.        Judgment:  Judgment normal.   Assessment/Plan: 1. Right flank pain Likely musculoskeletal in nature. Reviewed ultrasound results with the patient. Small, sub-centimeter cyst in lowe pole of left kidney without other abnormalities present.  Will continue tomonitor annually.  2. Kidney disease, chronic, stage III (GFR 30-59 ml/min) (HCC) Stable. She does continue see nephrology.   3. Acquired hypothyroidism Stable. Sees endocrinology  4. Essential hypertension Generally stable.   General Counseling: Stacey [name] understanding of the findings of todays visit and agrees with plan of treatment. I have discussed any further diagnostic evaluation that may be needed or ordered today. We also reviewed her medications today. she has been encouraged to call the office with any questions or concerns that should arise related to todays visit.  This patient was seen by Leretha Pol FNP Collaboration with Dr [name] as a part of collaborative care agreement  Time spent: 62 Minutes      Dr [name] Internal medicine

## 2018-11-26 ENCOUNTER — Other Ambulatory Visit: Payer: Self-pay | Admitting: Nurse Practitioner

## 2018-11-26 MED ORDER — PRAVASTATIN SODIUM 20 MG PO TABS: ORAL_TABLET | ORAL | 2 refills | Status: DC

## 2018-12-05 ENCOUNTER — Other Ambulatory Visit: Payer: Self-pay

## 2018-12-05 DIAGNOSIS — R601 Generalized edema: Secondary | ICD-10-CM

## 2018-12-05 MED ORDER — FUROSEMIDE 20 MG PO TABS: 40.0000 mg | ORAL_TABLET | Freq: Every day | ORAL | 2 refills | Status: DC

## 2018-12-09 ENCOUNTER — Other Ambulatory Visit: Payer: Self-pay

## 2018-12-09 DIAGNOSIS — J309 Allergic rhinitis, unspecified: Secondary | ICD-10-CM

## 2018-12-09 MED ORDER — CETIRIZINE HCL 10 MG PO TABS: 10.0000 mg | ORAL_TABLET | Freq: Every day | ORAL | 4 refills | Status: DC

## 2018-12-11 ENCOUNTER — Other Ambulatory Visit: Payer: Self-pay | Admitting: Nurse Practitioner

## 2018-12-11 DIAGNOSIS — Z1231 Encounter for screening mammogram for malignant neoplasm of breast: Secondary | ICD-10-CM

## 2018-12-12 DIAGNOSIS — I340 Nonrheumatic mitral (valve) insufficiency: Secondary | ICD-10-CM | POA: Diagnosis not present

## 2018-12-12 DIAGNOSIS — E782 Mixed hyperlipidemia: Secondary | ICD-10-CM | POA: Diagnosis not present

## 2018-12-12 DIAGNOSIS — I420 Dilated cardiomyopathy: Secondary | ICD-10-CM | POA: Diagnosis not present

## 2018-12-12 DIAGNOSIS — I6523 Occlusion and stenosis of bilateral carotid arteries: Secondary | ICD-10-CM | POA: Diagnosis not present

## 2018-12-12 DIAGNOSIS — I10 Essential (primary) hypertension: Secondary | ICD-10-CM | POA: Diagnosis not present

## 2018-12-12 DIAGNOSIS — D8685 Sarcoid myocarditis: Secondary | ICD-10-CM | POA: Diagnosis not present

## 2018-12-16 DIAGNOSIS — H33313 Horseshoe tear of retina without detachment, bilateral: Secondary | ICD-10-CM | POA: Diagnosis not present

## 2018-12-16 DIAGNOSIS — H35353 Cystoid macular degeneration, bilateral: Secondary | ICD-10-CM | POA: Diagnosis not present

## 2018-12-16 DIAGNOSIS — H04123 Dry eye syndrome of bilateral lacrimal glands: Secondary | ICD-10-CM | POA: Diagnosis not present

## 2018-12-16 DIAGNOSIS — Z961 Presence of intraocular lens: Secondary | ICD-10-CM | POA: Diagnosis not present

## 2019-01-16 ENCOUNTER — Ambulatory Visit
Admission: RE | Admit: 2019-01-16 | Discharge: 2019-01-16 | Disposition: A | Discharge status: Home / Self Care | Payer: Medicare Other | Source: Ambulatory Visit | Attending: Nurse Practitioner | Admitting: Nurse Practitioner

## 2019-01-16 DIAGNOSIS — Z1231 Encounter for screening mammogram for malignant neoplasm of breast: Secondary | ICD-10-CM | POA: Diagnosis not present

## 2019-01-19 NOTE — Progress Notes (Signed)
Negative mammogram

## 2019-01-30 DIAGNOSIS — Z23 Encounter for immunization: Secondary | ICD-10-CM | POA: Diagnosis not present

## 2019-01-30 DIAGNOSIS — D8685 Sarcoid myocarditis: Secondary | ICD-10-CM | POA: Diagnosis not present

## 2019-01-30 DIAGNOSIS — M79641 Pain in right hand: Secondary | ICD-10-CM | POA: Diagnosis not present

## 2019-01-30 DIAGNOSIS — H209 Unspecified iridocyclitis: Secondary | ICD-10-CM | POA: Diagnosis not present

## 2019-01-30 DIAGNOSIS — Z79899 Other long term (current) drug therapy: Secondary | ICD-10-CM | POA: Diagnosis not present

## 2019-02-17 DIAGNOSIS — H35353 Cystoid macular degeneration, bilateral: Secondary | ICD-10-CM | POA: Diagnosis not present

## 2019-02-17 DIAGNOSIS — H33313 Horseshoe tear of retina without detachment, bilateral: Secondary | ICD-10-CM | POA: Diagnosis not present

## 2019-02-17 DIAGNOSIS — Z961 Presence of intraocular lens: Secondary | ICD-10-CM | POA: Diagnosis not present

## 2019-02-17 DIAGNOSIS — H04123 Dry eye syndrome of bilateral lacrimal glands: Secondary | ICD-10-CM | POA: Diagnosis not present

## 2019-03-06 ENCOUNTER — Other Ambulatory Visit: Payer: Self-pay

## 2019-03-06 DIAGNOSIS — R601 Generalized edema: Secondary | ICD-10-CM

## 2019-03-06 MED ORDER — FUROSEMIDE 20 MG PO TABS: 40.0000 mg | ORAL_TABLET | Freq: Every day | ORAL | 2 refills | Status: DC

## 2019-03-26 DIAGNOSIS — M810 Age-related osteoporosis without current pathological fracture: Secondary | ICD-10-CM | POA: Diagnosis not present

## 2019-03-26 DIAGNOSIS — E210 Primary hyperparathyroidism: Secondary | ICD-10-CM | POA: Diagnosis not present

## 2019-03-26 DIAGNOSIS — E039 Hypothyroidism, unspecified: Secondary | ICD-10-CM | POA: Diagnosis not present

## 2019-03-31 DIAGNOSIS — H401133 Primary open-angle glaucoma, bilateral, severe stage: Secondary | ICD-10-CM | POA: Diagnosis not present

## 2019-03-31 DIAGNOSIS — H1089 Other conjunctivitis: Secondary | ICD-10-CM | POA: Diagnosis not present

## 2019-04-18 ENCOUNTER — Other Ambulatory Visit: Payer: Self-pay | Admitting: Adult Health

## 2019-04-18 DIAGNOSIS — K219 Gastro-esophageal reflux disease without esophagitis: Secondary | ICD-10-CM

## 2019-04-21 DIAGNOSIS — H1089 Other conjunctivitis: Secondary | ICD-10-CM | POA: Diagnosis not present

## 2019-04-21 DIAGNOSIS — H401133 Primary open-angle glaucoma, bilateral, severe stage: Secondary | ICD-10-CM | POA: Diagnosis not present

## 2019-05-01 DIAGNOSIS — H209 Unspecified iridocyclitis: Secondary | ICD-10-CM | POA: Diagnosis not present

## 2019-05-01 DIAGNOSIS — M5442 Lumbago with sciatica, left side: Secondary | ICD-10-CM | POA: Diagnosis not present

## 2019-05-01 DIAGNOSIS — D8685 Sarcoid myocarditis: Secondary | ICD-10-CM | POA: Diagnosis not present

## 2019-05-01 DIAGNOSIS — Z79899 Other long term (current) drug therapy: Secondary | ICD-10-CM | POA: Diagnosis not present

## 2019-05-01 DIAGNOSIS — N951 Menopausal and female climacteric states: Secondary | ICD-10-CM | POA: Diagnosis not present

## 2019-05-01 DIAGNOSIS — G8929 Other chronic pain: Secondary | ICD-10-CM | POA: Diagnosis not present

## 2019-05-09 ENCOUNTER — Other Ambulatory Visit: Payer: Self-pay

## 2019-05-09 DIAGNOSIS — J309 Allergic rhinitis, unspecified: Secondary | ICD-10-CM

## 2019-05-09 DIAGNOSIS — I10 Essential (primary) hypertension: Secondary | ICD-10-CM

## 2019-05-09 MED ORDER — CLONIDINE HCL 0.1 MG PO TABS: 0.1000 mg | ORAL_TABLET | Freq: Two times a day (BID) | ORAL | 4 refills | Status: DC

## 2019-05-09 MED ORDER — LOSARTAN POTASSIUM 100 MG PO TABS: 100.0000 mg | ORAL_TABLET | Freq: Every day | ORAL | 1 refills | Status: DC

## 2019-05-09 MED ORDER — MONTELUKAST SODIUM 10 MG PO TABS: 10.0000 mg | ORAL_TABLET | Freq: Every day | ORAL | 3 refills | Status: DC

## 2019-05-10 ENCOUNTER — Ambulatory Visit: Payer: Medicare Other | Attending: Internal Medicine

## 2019-05-10 DIAGNOSIS — Z23 Encounter for immunization: Secondary | ICD-10-CM | POA: Insufficient documentation

## 2019-05-10 NOTE — Progress Notes (Signed)
   Covid-19 Vaccination Clinic  Name:  Stacey [name]    MRN: [id number] DOB: September 25, 1945  05/10/2019  [name] was observed post Covid-19 immunization for 15 minutes without incidence. She was provided with Vaccine Information Sheet and instruction to access the V-Safe system.   [name] was instructed to call 911 with any severe reactions post vaccine: Marland Kitchen Difficulty breathing  . Swelling of your face and throat  . A fast heartbeat  . A bad rash all over your body  . Dizziness and weakness    Immunizations Administered    Name Date Dose VIS Date Route   Pfizer COVID-19 Vaccine 05/10/2019 10:56 AM 0.3 mL 02/28/2019 Intramuscular   Manufacturer: [place]   Lot: [id number]   [place]: [id number]

## 2019-05-19 DIAGNOSIS — H401133 Primary open-angle glaucoma, bilateral, severe stage: Secondary | ICD-10-CM | POA: Diagnosis not present

## 2019-05-19 DIAGNOSIS — H35353 Cystoid macular degeneration, bilateral: Secondary | ICD-10-CM | POA: Diagnosis not present

## 2019-05-19 DIAGNOSIS — H33313 Horseshoe tear of retina without detachment, bilateral: Secondary | ICD-10-CM | POA: Diagnosis not present

## 2019-05-19 DIAGNOSIS — H04421 Chronic lacrimal canaliculitis of right lacrimal passage: Secondary | ICD-10-CM | POA: Diagnosis not present

## 2019-05-19 DIAGNOSIS — H04123 Dry eye syndrome of bilateral lacrimal glands: Secondary | ICD-10-CM | POA: Diagnosis not present

## 2019-05-26 ENCOUNTER — Ambulatory Visit: Payer: Medicare Other | Admitting: Nurse Practitioner

## 2019-05-26 DIAGNOSIS — H04411 Chronic dacryocystitis of right lacrimal passage: Secondary | ICD-10-CM | POA: Diagnosis not present

## 2019-06-02 ENCOUNTER — Other Ambulatory Visit: Payer: Self-pay

## 2019-06-02 DIAGNOSIS — R601 Generalized edema: Secondary | ICD-10-CM

## 2019-06-02 MED ORDER — FUROSEMIDE 20 MG PO TABS: 40.0000 mg | ORAL_TABLET | Freq: Every day | ORAL | 2 refills | Status: DC

## 2019-06-04 ENCOUNTER — Ambulatory Visit: Payer: Medicare Other | Attending: Internal Medicine

## 2019-06-04 DIAGNOSIS — Z23 Encounter for immunization: Secondary | ICD-10-CM

## 2019-06-04 NOTE — Progress Notes (Signed)
   Covid-19 Vaccination Clinic  Name:  Stacey [name]    MRN: [id number] DOB: 1945-05-18  06/04/2019  [name] was observed post Covid-19 immunization for 15 minutes without incident. She was provided with Vaccine Information Sheet and instruction to access the V-Safe system.   [name] was instructed to call 911 with any severe reactions post vaccine: Marland Kitchen Difficulty breathing  . Swelling of face and throat  . A fast heartbeat  . A bad rash all over body  . Dizziness and weakness   Immunizations Administered    Name Date Dose VIS Date Route   Pfizer COVID-19 Vaccine 06/04/2019  1:55 PM 0.3 mL 02/28/2019 Intramuscular   Manufacturer: [place]   Lot: [id number]   [place]: [id number]

## 2019-06-10 DIAGNOSIS — I119 Hypertensive heart disease without heart failure: Secondary | ICD-10-CM | POA: Diagnosis not present

## 2019-06-10 DIAGNOSIS — I10 Essential (primary) hypertension: Secondary | ICD-10-CM | POA: Diagnosis not present

## 2019-06-10 DIAGNOSIS — E782 Mixed hyperlipidemia: Secondary | ICD-10-CM | POA: Diagnosis not present

## 2019-06-10 DIAGNOSIS — D8685 Sarcoid myocarditis: Secondary | ICD-10-CM | POA: Diagnosis not present

## 2019-06-10 DIAGNOSIS — I420 Dilated cardiomyopathy: Secondary | ICD-10-CM | POA: Diagnosis not present

## 2019-06-11 ENCOUNTER — Telehealth: Payer: Self-pay

## 2019-06-11 NOTE — Telephone Encounter (Signed)
Confirmed and screened for 06-13-19 ov.

## 2019-06-13 ENCOUNTER — Other Ambulatory Visit: Payer: Self-pay

## 2019-06-13 ENCOUNTER — Ambulatory Visit (INDEPENDENT_AMBULATORY_CARE_PROVIDER_SITE_OTHER): Payer: Medicare Other | Admitting: Nurse Practitioner

## 2019-06-13 ENCOUNTER — Encounter: Payer: Self-pay | Admitting: Nurse Practitioner

## 2019-06-13 VITALS — BP 168/82 | HR 65 | Temp 97.4°F | Resp 16 | Ht <= 58 in | Wt 152.6 lb

## 2019-06-13 DIAGNOSIS — L209 Atopic dermatitis, unspecified: Secondary | ICD-10-CM

## 2019-06-13 DIAGNOSIS — J300 Vasomotor rhinitis: Secondary | ICD-10-CM

## 2019-06-13 DIAGNOSIS — E039 Hypothyroidism, unspecified: Secondary | ICD-10-CM | POA: Diagnosis not present

## 2019-06-13 DIAGNOSIS — G5762 Lesion of plantar nerve, left lower limb: Secondary | ICD-10-CM

## 2019-06-13 DIAGNOSIS — R229 Localized swelling, mass and lump, unspecified: Secondary | ICD-10-CM

## 2019-06-13 DIAGNOSIS — I10 Essential (primary) hypertension: Secondary | ICD-10-CM

## 2019-06-13 MED ORDER — FLUTICASONE PROPIONATE 50 MCG/ACT NA SUSP: 2.0000 | Freq: Every day | NASAL | 6 refills | Status: DC

## 2019-06-13 MED ORDER — TRIAMCINOLONE ACETONIDE 0.025 % EX CREA: 1.0000 "application " | TOPICAL_CREAM | Freq: Two times a day (BID) | CUTANEOUS | 5 refills | Status: DC

## 2019-06-13 NOTE — Progress Notes (Signed)
[facility] [place], [place] [address]  Internal MEDICINE  Office Visit Note  Patient Name: Stacey [name]  [id number]  [id number]  Date of Service: 06/28/2019  Chief Complaint  Patient presents with  . Follow-up    eye surgery scheduled for the 04/21/201  . Hypertension  . Pruritis  . Foot Pain    left foot has ball on the bottom     The patient is here for routine follow up. She states that she has some nasal and sinus congestion. Takes zyrtec everyday. Does not use nasal spray. Does not feel sick. Denies sinus headache or fever. States that her nose runs all the time. Sometimes, she has to blow her nose multiple times during the day.  Reporting some hot flashes. She does have hypothyroid and is on levothyroxine at 34mcg every day. She is due in next month or so to have this repeated. Overactive thyroid could cause the patient to have increase sweating . -scheduled for echo per her cardiologist in 06/2019 -will have eye surgery to fix clogged tear duct  -bony swelling on ball of her left foot. Not tender. Has noted for about a month -soft swelling on left wrist. Has been present for some time. No changes. Not tender.       Current Medication: Outpatient Encounter Medications as of 06/13/2019  Medication Sig  . albuterol (VENTOLIN HFA) 108 (90 Base) MCG/ACT inhaler Inhale 2 puffs into the lungs every 6 (six) hours as needed for wheezing or shortness of breath.  . carvedilol (COREG) 6.25 MG tablet Take 1 tablet (6.25 mg total) by mouth 2 (two) times daily with a meal.  . cetirizine (ZYRTEC) 10 MG tablet Take 1 tablet (10 mg total) by mouth daily.  . Cholecalciferol (VITAMIN D3) 3000 units TABS Take 3,000 Units by mouth daily.   . cinacalcet (SENSIPAR) 30 MG tablet Take 30 mg by mouth once a week.   . clobetasol ointment (TEMOVATE) [id number] % Apply 1 application topically 2 (two) times daily as needed (rash).   . cloNIDine (CATAPRES) 0.1 MG tablet Take 1  tablet (0.1 mg total) by mouth 2 (two) times daily.  . fluticasone (FLOVENT HFA) 110 MCG/ACT inhaler Take 2 puffs at bed time (Patient taking differently: Inhale 2 puffs into the lungs at bedtime as needed (shortness of breath). )  . furosemide (LASIX) 20 MG tablet Take 2 tablets (40 mg total) by mouth daily.  . hydrALAZINE (APRESOLINE) 50 MG tablet Take 2 tablets (100 mg total) by mouth 3 (three) times daily.  Marland Kitchen latanoprost (XALATAN) 0.005 % ophthalmic solution Place 1 drop into both eyes at bedtime.   Marland Kitchen levothyroxine (SYNTHROID, LEVOTHROID) 88 MCG tablet Take 88 mcg by mouth daily before breakfast.   . losartan (COZAAR) 100 MG tablet Take 1 tablet (100 mg total) by mouth daily.  . montelukast (SINGULAIR) 10 MG tablet Take 1 tablet (10 mg total) by mouth daily.  . Multiple Vitamins-Minerals (MULTIVITAMIN GUMMIES WOMENS) CHEW Chew 2 tablets by mouth daily.  . ondansetron (ZOFRAN) 8 MG tablet Take by mouth every 8 (eight) hours as needed for nausea or vomiting.  . pantoprazole (PROTONIX) 40 MG tablet TAKE 1 TABLET BY MOUTH AS NEEDED  . pravastatin (PRAVACHOL) 20 MG tablet TAKE 1 TABLET(20 MG) BY MOUTH EVERY NIGHT  . RESTASIS 0.05 % ophthalmic emulsion Place 1 drop into both eyes 2 (two) times daily.  . timolol (TIMOPTIC) 0.25 % ophthalmic solution Place 1 drop into both eyes 2 (two)  times daily.   . colchicine 0.6 MG tablet Take 1 tablet (0.6 mg total) by mouth daily.  . fluticasone (FLONASE) 50 MCG/ACT nasal spray Place 2 sprays into both nostrils daily.  Marland Kitchen triamcinolone (KENALOG) 0.025 % cream Apply 1 application topically 2 (two) times daily.   No facility-administered encounter medications on file as of 06/13/2019.    Surgical History: Past Surgical History:  Procedure Laterality Date  . GALLBLADDER SURGERY    . KNEE SURGERY    . thyroidectomy      Medical History: Past Medical History:  Diagnosis Date  . Hypertension   . Thyroid disease     Family History: Family History   Problem Relation Age of Onset  . Hypertension Mother   . Diabetes Mother   . Anuerysm Mother   . Diabetes Father   . Hypertension Father     Social History   Socioeconomic History  . Marital status: Widowed    Spouse name: Not on file  . Number of children: Not on file  . Years of education: Not on file  . Highest education level: Not on file  Occupational History  . Not on file  Tobacco Use  . Smoking status: Never Smoker  . Smokeless tobacco: Never Used  Substance and Sexual Activity  . Alcohol use: No  . Drug use: Not Currently  . Sexual activity: Not on file  Other Topics Concern  . Not on file  Social History Narrative  . Not on file   Social Determinants of Health   Financial Resource Strain:   . Difficulty of Paying Living Expenses:   Food Insecurity:   . Worried About Charity fundraiser in the Last Year:   . Arboriculturist in the Last Year:   Transportation Needs:   . Film/video editor (Medical):   Marland Kitchen Lack of Transportation (Non-Medical):   Physical Activity:   . Days of Exercise per Week:   . Minutes of Exercise per Session:   Stress:   . Feeling of Stress :   Social Connections:   . Frequency of Communication with Friends and Family:   . Frequency of Social Gatherings with Friends and Family:   . Attends Religious Services:   . Active Member of Clubs or Organizations:   . Attends Archivist Meetings:   Marland Kitchen Marital Status:   Intimate Partner Violence:   . Fear of Current or Ex-Partner:   . Emotionally Abused:   Marland Kitchen Physically Abused:   . Sexually Abused:       Review of Systems  Constitutional: Negative for activity change, chills, fatigue and unexpected weight change.  HENT: Positive for postnasal drip, rhinorrhea and sneezing. Negative for congestion and sore throat.   Eyes: Positive for redness.       Clogged tear duct. Will be having surgery to repair this in new future.   Respiratory: Negative for cough, chest tightness and  shortness of breath.   Cardiovascular: Negative for chest pain and palpitations.       Blood pressure elevated.   Gastrointestinal: Negative for abdominal pain, constipation, diarrhea, nausea and vomiting.  Endocrine: Positive for heat intolerance. Negative for cold intolerance, polydipsia and polyuria.  Musculoskeletal: Positive for arthralgias. Negative for back pain, joint swelling and neck pain.       Bony swelling on ball of her left foot. Not tender. Has noted for about a month soft swelling on left wrist. Has been present for some time. No changes. Not  tender  Skin: Negative for rash.  Neurological: Negative for dizziness, tremors, numbness and headaches.  Hematological: Negative for adenopathy. Does not bruise/bleed easily.  Psychiatric/Behavioral: Negative for behavioral problems (Depression), sleep disturbance and suicidal ideas. The patient is not nervous/anxious.    Today's Vitals   06/13/19 1048  BP: (!) 168/82  Pulse: 65  Resp: 16  Temp: (!) 97.4 F (36.3 C)  SpO2: 96%  Weight: 152 lb 9.6 oz (69.2 kg)  Height: 4\' 9"  (1.448 m)   Body mass index is 33.02 kg/m.   Physical Exam Vitals and nursing note reviewed.  Constitutional:      General: She is not in acute distress.    Appearance: Normal appearance. She is well-developed. She is not diaphoretic.  HENT:     Head: Normocephalic and atraumatic.     Mouth/Throat:     Pharynx: No oropharyngeal exudate.  Eyes:     Pupils: Pupils are equal, round, and reactive to light.  Neck:     Thyroid: No thyromegaly.     Vascular: No JVD.     Trachea: No tracheal deviation.  Cardiovascular:     Rate and Rhythm: Normal rate and regular rhythm.     Heart sounds: Normal heart sounds. No murmur. No friction rub. No gallop.   Pulmonary:     Effort: Pulmonary effort is normal. No respiratory distress.     Breath sounds: Normal breath sounds. No wheezing or rales.  Chest:     Chest wall: No tenderness.  Abdominal:      Palpations: Abdomen is soft.  Musculoskeletal:        General: Normal range of motion.     Cervical back: Normal range of motion and neck supple.     Comments: There is small, palpable, round, bony like lump on the bottom of the left foot, just below the great toe. Non tender, with no redness or evidence of inflammation.  She has small, soft, palpable mass on the anterior surface of the left forearm/wrist. It is non tender. There is no redness or evidence of inflammation.   Lymphadenopathy:     Cervical: No cervical adenopathy.  Skin:    General: Skin is warm and dry.  Neurological:     General: No focal deficit present.     Mental Status: She is alert and oriented to person, place, and time.     Cranial Nerves: No cranial nerve deficit.  Psychiatric:        Mood and Affect: Mood normal.        Behavior: Behavior normal.        Thought Content: Thought content normal.        Judgment: Judgment normal.    Assessment/Plan: 1. Essential hypertension Generally stable. Continue bp medication as prescribed. Regular visits with cardiology as scheduled.   2. Acquired hypothyroidism Continue regular visits with endocrinology as scheduled.   3. Vasomotor rhinitis Start flonase. Use two sprays in both nostrils daily.  - fluticasone (FLONASE) 50 MCG/ACT nasal spray; Place 2 sprays into both nostrils daily.  Dispense: 16 g; Refill: 6  4. Atopic dermatitis, unspecified type Use triamcinolone cream twice daily as needed.  - triamcinolone (KENALOG) 0.025 % cream; Apply 1 application topically 2 (two) times daily.  Dispense: 80 g; Refill: 5  5. Localized superficial swelling, mass, or lump Soft, fluid-filled mass on aterior aspect of left forearm/wrist. Will continue to monitor.   6. Plantar neuroma of left foot Bony type lesion on the plantar aspect of  the left foot, just below the great toe. Will continue to monitor. Refer for further evaluation as indicated.   General Counseling: adiya livigni understanding of the findings of todays visit and agrees with plan of treatment. I have discussed any further diagnostic evaluation that may be needed or ordered today. We also reviewed her medications today. she has been encouraged to call the office with any questions or concerns that should arise related to todays visit.  Hypertension Counseling:   The following hypertensive lifestyle modification were recommended and discussed:  1. Limiting alcohol intake to less than 1 oz/day of ethanol:(24 oz of beer or 8 oz of wine or 2 oz of 100-proof whiskey). 2. Take baby ASA 81 mg daily. 3. Importance of regular aerobic exercise and losing weight. 4. Reduce dietary saturated fat and cholesterol intake for overall cardiovascular health. 5. Maintaining adequate dietary potassium, calcium, and magnesium intake. 6. Regular monitoring of the blood pressure. 7. Reduce sodium intake to less than 100 mmol/day (less than 2.3 gm of sodium or less than 6 gm of sodium choride)   This patient was seen by [place] with Dr [name] as a part of collaborative care agreement  Meds ordered this encounter  Medications  . fluticasone (FLONASE) 50 MCG/ACT nasal spray    Sig: Place 2 sprays into both nostrils daily.    Dispense:  16 g    Refill:  6    Order Specific Question:   Supervising Provider    Answer:   [name] [id number]  . triamcinolone (KENALOG) 0.025 % cream    Sig: Apply 1 application topically 2 (two) times daily.    Dispense:  80 g    Refill:  5    Order Specific Question:   Supervising Provider    Answer:   [name] [id number]    Total time spent: 30 Minutes  Time spent includes review of chart, medications, test results, and follow up plan with the patient.      Dr [name] Internal medicine

## 2019-06-25 DIAGNOSIS — I420 Dilated cardiomyopathy: Secondary | ICD-10-CM | POA: Diagnosis not present

## 2019-06-28 DIAGNOSIS — R229 Localized swelling, mass and lump, unspecified: Secondary | ICD-10-CM | POA: Insufficient documentation

## 2019-06-28 DIAGNOSIS — G5762 Lesion of plantar nerve, left lower limb: Secondary | ICD-10-CM | POA: Insufficient documentation

## 2019-06-28 DIAGNOSIS — L209 Atopic dermatitis, unspecified: Secondary | ICD-10-CM | POA: Insufficient documentation

## 2019-06-28 DIAGNOSIS — J300 Vasomotor rhinitis: Secondary | ICD-10-CM | POA: Insufficient documentation

## 2019-07-03 DIAGNOSIS — I10 Essential (primary) hypertension: Secondary | ICD-10-CM | POA: Diagnosis not present

## 2019-07-03 DIAGNOSIS — I420 Dilated cardiomyopathy: Secondary | ICD-10-CM | POA: Diagnosis not present

## 2019-07-03 DIAGNOSIS — I119 Hypertensive heart disease without heart failure: Secondary | ICD-10-CM | POA: Diagnosis not present

## 2019-07-03 DIAGNOSIS — D8685 Sarcoid myocarditis: Secondary | ICD-10-CM | POA: Diagnosis not present

## 2019-07-06 DIAGNOSIS — Z20822 Contact with and (suspected) exposure to covid-19: Secondary | ICD-10-CM | POA: Diagnosis not present

## 2019-07-06 DIAGNOSIS — H04411 Chronic dacryocystitis of right lacrimal passage: Secondary | ICD-10-CM | POA: Diagnosis not present

## 2019-07-09 DIAGNOSIS — Z881 Allergy status to other antibiotic agents status: Secondary | ICD-10-CM | POA: Diagnosis not present

## 2019-07-09 DIAGNOSIS — Z79890 Hormone replacement therapy (postmenopausal): Secondary | ICD-10-CM | POA: Diagnosis not present

## 2019-07-09 DIAGNOSIS — H04221 Epiphora due to insufficient drainage, right lacrimal gland: Secondary | ICD-10-CM | POA: Diagnosis not present

## 2019-07-09 DIAGNOSIS — I10 Essential (primary) hypertension: Secondary | ICD-10-CM | POA: Diagnosis not present

## 2019-07-09 DIAGNOSIS — Z79899 Other long term (current) drug therapy: Secondary | ICD-10-CM | POA: Diagnosis not present

## 2019-07-09 DIAGNOSIS — Z888 Allergy status to other drugs, medicaments and biological substances status: Secondary | ICD-10-CM | POA: Diagnosis not present

## 2019-07-09 DIAGNOSIS — H04541 Stenosis of right lacrimal canaliculi: Secondary | ICD-10-CM | POA: Diagnosis not present

## 2019-07-09 DIAGNOSIS — Z885 Allergy status to narcotic agent status: Secondary | ICD-10-CM | POA: Diagnosis not present

## 2019-07-09 DIAGNOSIS — E039 Hypothyroidism, unspecified: Secondary | ICD-10-CM | POA: Diagnosis not present

## 2019-07-09 DIAGNOSIS — K219 Gastro-esophageal reflux disease without esophagitis: Secondary | ICD-10-CM | POA: Diagnosis not present

## 2019-07-09 DIAGNOSIS — Z886 Allergy status to analgesic agent status: Secondary | ICD-10-CM | POA: Diagnosis not present

## 2019-07-09 DIAGNOSIS — H04551 Acquired stenosis of right nasolacrimal duct: Secondary | ICD-10-CM | POA: Diagnosis not present

## 2019-07-09 HISTORY — PX: EYE SURGERY: SHX253

## 2019-08-19 ENCOUNTER — Other Ambulatory Visit: Payer: Self-pay

## 2019-08-19 MED ORDER — PRAVASTATIN SODIUM 20 MG PO TABS: ORAL_TABLET | ORAL | 2 refills | Status: DC

## 2019-08-20 IMAGING — US US RENAL
1 series · 14 of 25 positions shown · non-contrast
Comparison: CT 07/27/2010

CLINICAL DATA: Acute renal failure

EXAM:
RENAL / URINARY TRACT ULTRASOUND COMPLETE

[Series 1: us renal · 0.21mm/px · 14 of 27 slices shown]
[im 1/27]
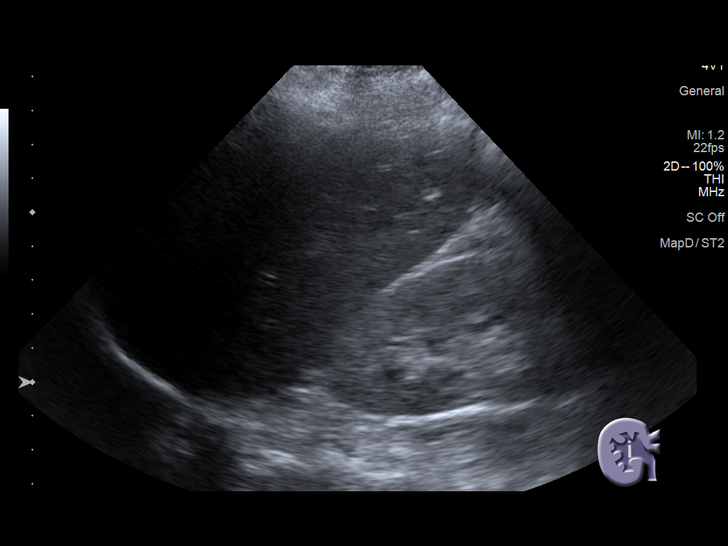
[im 3/27]
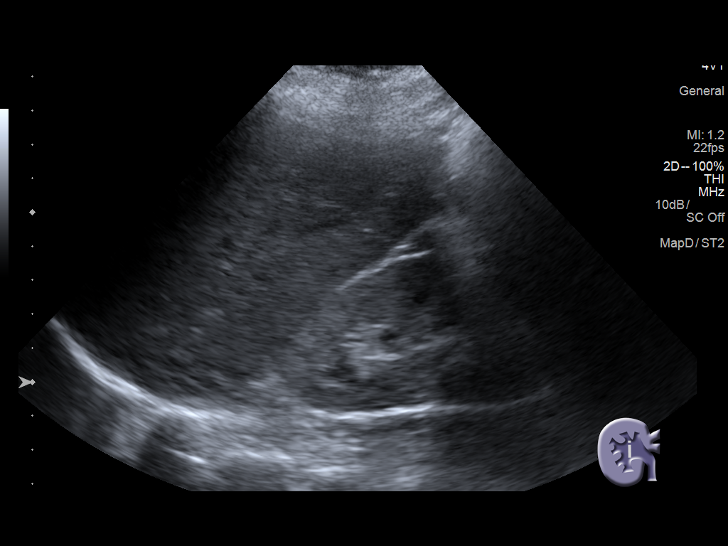
[im 5/27]
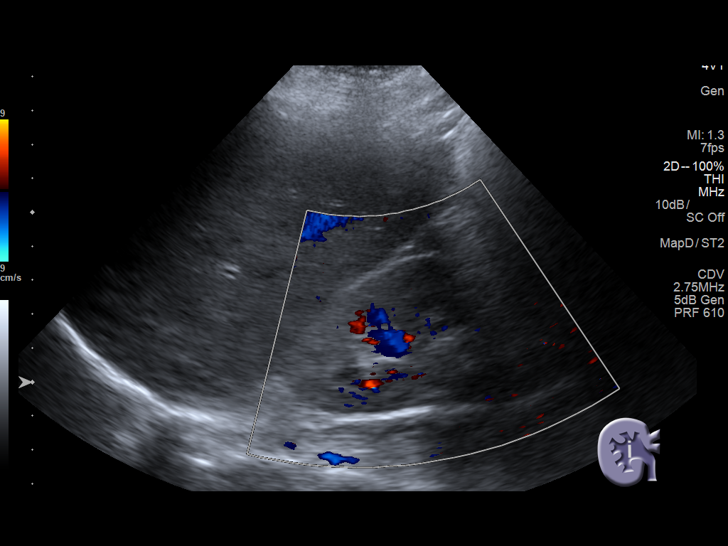
[im 7/27]
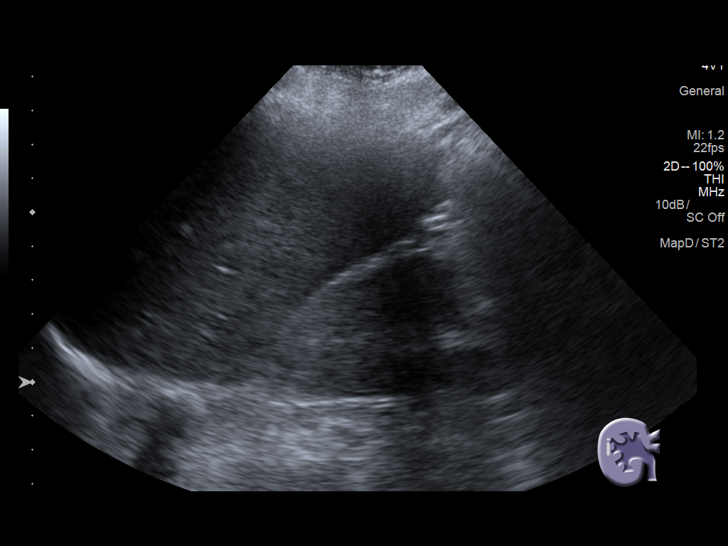
[im 9/27]
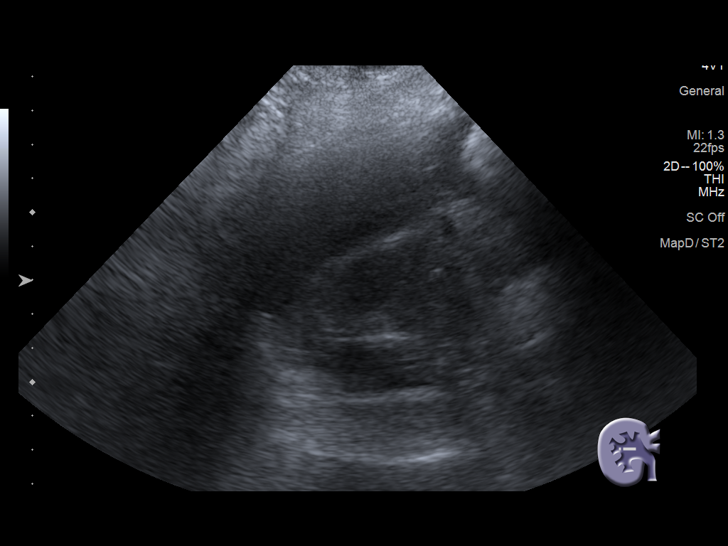
[im 10/27]
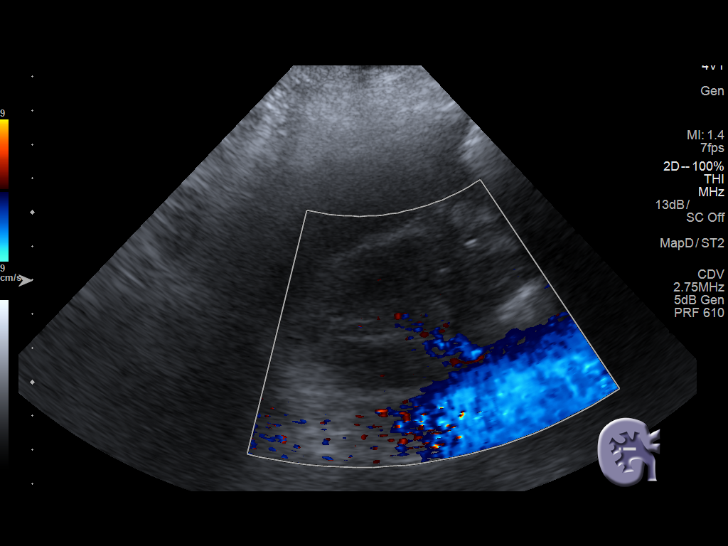
[im 12/27]
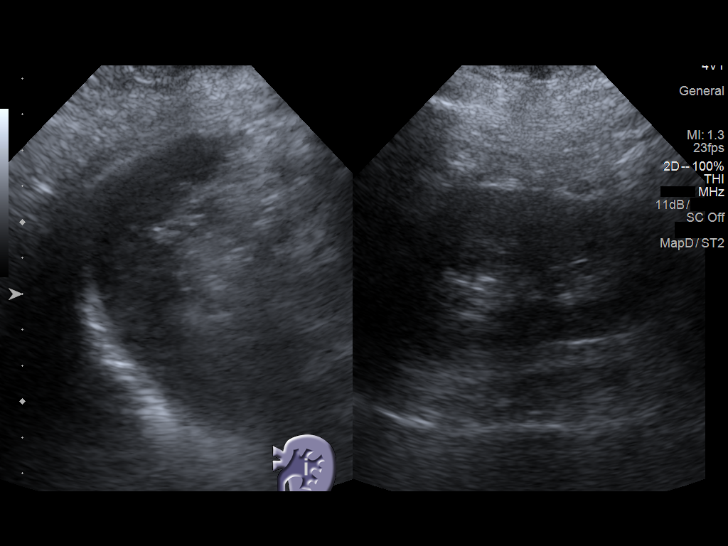
[im 15/27]
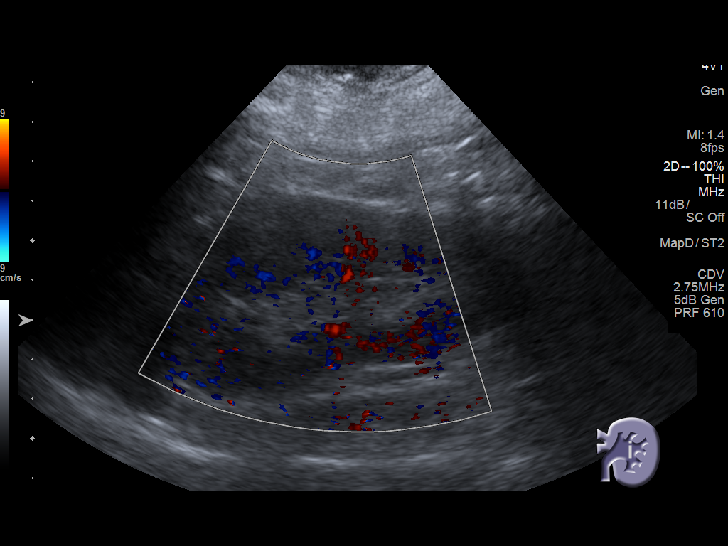
[im 17/27]
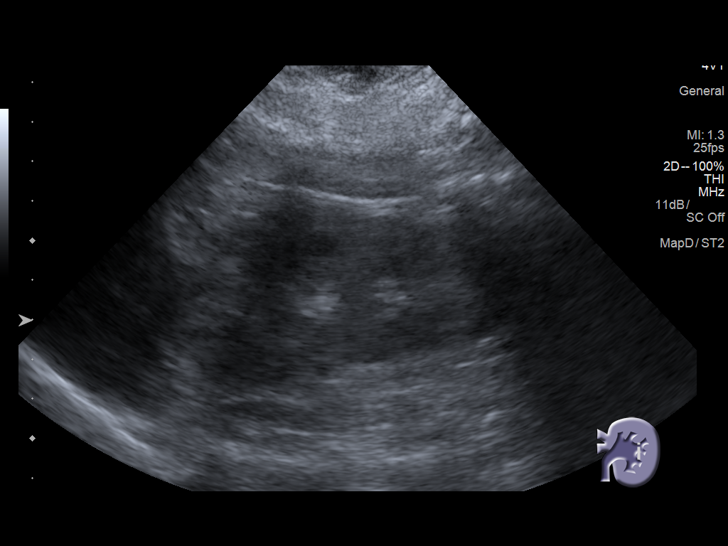
[im 18/27]
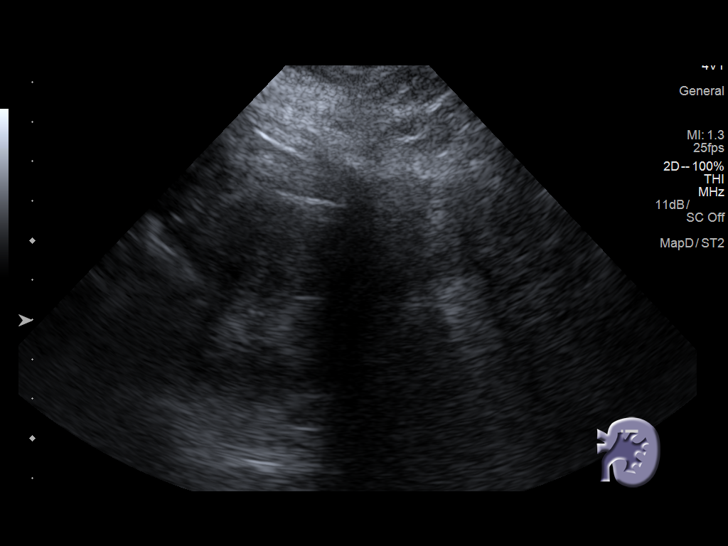
[im 20/27]
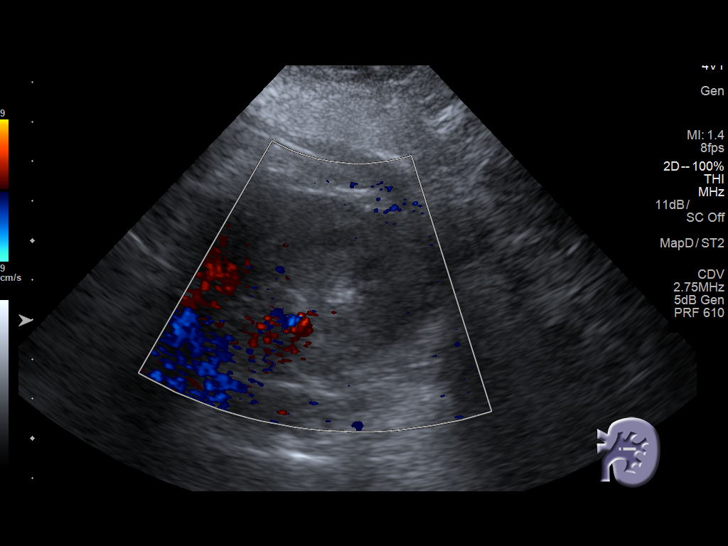
[im 22/27]
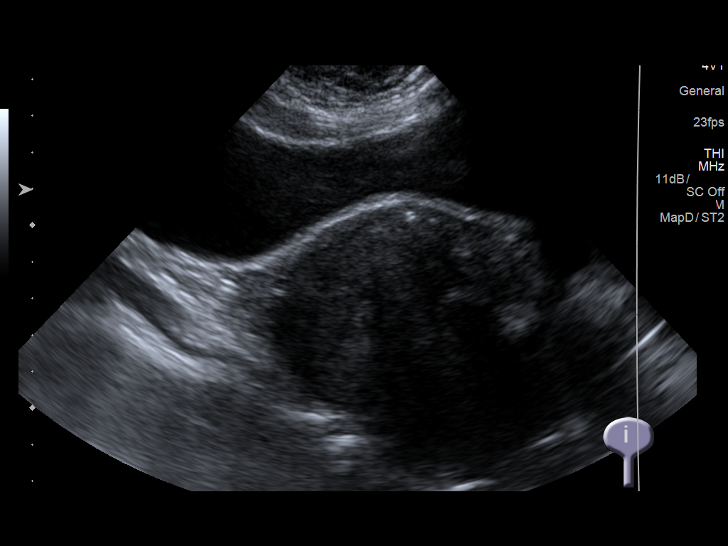
[im 24/27]
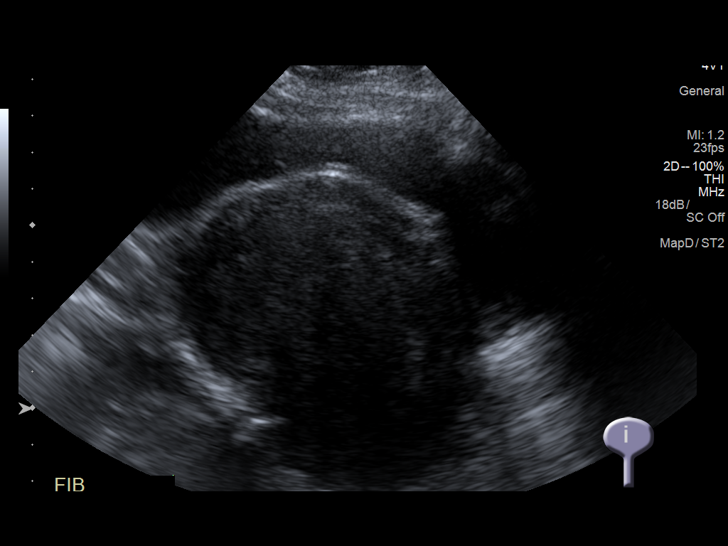
[im 27/27]
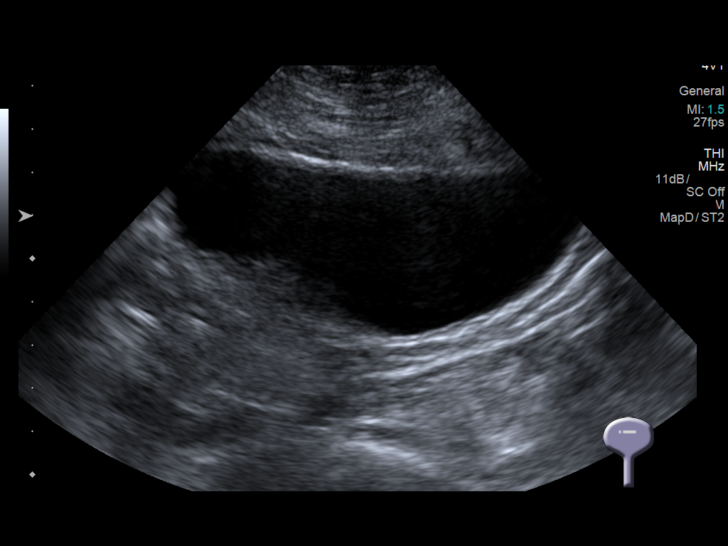

[14 of 25 positions shown; findings below may reference images not displayed]

FINDINGS: Right Kidney:

Length: 8.2 cm. Diffusely increased echotexture. No mass or
hydronephrosis.

Left Kidney:

Length: 8.5 cm. Echogenicity within normal limits. No mass or
hydronephrosis visualized.

Bladder:

Appears normal for degree of bladder distention.

Incidentally noted is enlarged fibroid uterus.
IMPRESSION: Somewhat atrophic kidneys with increased echotexture of the right
kidney. This likely reflects chronic medical renal disease. No
hydronephrosis.

Enlarged fibroid uterus.

## 2019-08-25 DIAGNOSIS — H04123 Dry eye syndrome of bilateral lacrimal glands: Secondary | ICD-10-CM | POA: Diagnosis not present

## 2019-08-25 DIAGNOSIS — H35353 Cystoid macular degeneration, bilateral: Secondary | ICD-10-CM | POA: Diagnosis not present

## 2019-08-25 DIAGNOSIS — H401133 Primary open-angle glaucoma, bilateral, severe stage: Secondary | ICD-10-CM | POA: Diagnosis not present

## 2019-08-25 DIAGNOSIS — H33313 Horseshoe tear of retina without detachment, bilateral: Secondary | ICD-10-CM | POA: Diagnosis not present

## 2019-08-29 ENCOUNTER — Other Ambulatory Visit: Payer: Self-pay

## 2019-08-29 DIAGNOSIS — R601 Generalized edema: Secondary | ICD-10-CM

## 2019-08-29 MED ORDER — FUROSEMIDE 20 MG PO TABS: 40.0000 mg | ORAL_TABLET | Freq: Every day | ORAL | 2 refills | Status: DC

## 2019-09-24 DIAGNOSIS — E039 Hypothyroidism, unspecified: Secondary | ICD-10-CM | POA: Diagnosis not present

## 2019-10-01 DIAGNOSIS — E039 Hypothyroidism, unspecified: Secondary | ICD-10-CM | POA: Diagnosis not present

## 2019-10-01 DIAGNOSIS — M810 Age-related osteoporosis without current pathological fracture: Secondary | ICD-10-CM | POA: Diagnosis not present

## 2019-10-01 DIAGNOSIS — E210 Primary hyperparathyroidism: Secondary | ICD-10-CM | POA: Diagnosis not present

## 2019-10-08 DIAGNOSIS — H401133 Primary open-angle glaucoma, bilateral, severe stage: Secondary | ICD-10-CM | POA: Diagnosis not present

## 2019-10-08 DIAGNOSIS — H1089 Other conjunctivitis: Secondary | ICD-10-CM | POA: Diagnosis not present

## 2019-10-27 DIAGNOSIS — H04551 Acquired stenosis of right nasolacrimal duct: Secondary | ICD-10-CM | POA: Diagnosis not present

## 2019-11-03 ENCOUNTER — Other Ambulatory Visit: Payer: Self-pay

## 2019-11-03 DIAGNOSIS — I10 Essential (primary) hypertension: Secondary | ICD-10-CM

## 2019-11-03 MED ORDER — LOSARTAN POTASSIUM 100 MG PO TABS: 100.0000 mg | ORAL_TABLET | Freq: Every day | ORAL | 1 refills | Status: DC

## 2019-11-12 ENCOUNTER — Telehealth: Payer: Self-pay

## 2019-11-12 NOTE — Telephone Encounter (Signed)
Confirmed screened  °

## 2019-11-14 ENCOUNTER — Other Ambulatory Visit: Payer: Self-pay

## 2019-11-14 ENCOUNTER — Encounter: Payer: Self-pay | Admitting: Nurse Practitioner

## 2019-11-14 ENCOUNTER — Ambulatory Visit (INDEPENDENT_AMBULATORY_CARE_PROVIDER_SITE_OTHER): Payer: Medicare Other | Admitting: Nurse Practitioner

## 2019-11-14 VITALS — BP 134/53 | HR 61 | Temp 98.6°F | Resp 16 | Ht <= 58 in | Wt 149.4 lb

## 2019-11-14 DIAGNOSIS — R300 Dysuria: Secondary | ICD-10-CM

## 2019-11-14 DIAGNOSIS — Z1231 Encounter for screening mammogram for malignant neoplasm of breast: Secondary | ICD-10-CM

## 2019-11-14 DIAGNOSIS — I10 Essential (primary) hypertension: Secondary | ICD-10-CM | POA: Diagnosis not present

## 2019-11-14 DIAGNOSIS — L209 Atopic dermatitis, unspecified: Secondary | ICD-10-CM | POA: Diagnosis not present

## 2019-11-14 DIAGNOSIS — E039 Hypothyroidism, unspecified: Secondary | ICD-10-CM

## 2019-11-14 DIAGNOSIS — Z0001 Encounter for general adult medical examination with abnormal findings: Secondary | ICD-10-CM

## 2019-11-14 DIAGNOSIS — M150 Primary generalized (osteo)arthritis: Secondary | ICD-10-CM | POA: Diagnosis not present

## 2019-11-14 MED ORDER — NAPROXEN 500 MG PO TABS: 500.0000 mg | ORAL_TABLET | Freq: Two times a day (BID) | ORAL | 3 refills | Status: DC

## 2019-11-14 MED ORDER — TRIAMCINOLONE ACETONIDE 0.1 % EX OINT: 1.0000 "application " | TOPICAL_OINTMENT | Freq: Two times a day (BID) | CUTANEOUS | 1 refills | Status: DC

## 2019-11-14 NOTE — Progress Notes (Signed)
[facility] [place], [place] [address]  Internal MEDICINE  Office Visit Note  Patient Name: Stacey [name]  [id number]  [id number]  Date of Service: 11/30/2019   Pt is here for routine health maintenance examination  Chief Complaint  Patient presents with  . Medicare Wellness    refill request  . Hypertension  . Quality Metric Gaps    TDAP, PNA     The patient presents for health maintenance exam. She continues to see cardiologist for management of hypertension and CAD. Her blood pressure is well managed. She denies chest pain, chest pressure, or unusual shortness of breath. She continues to see endocrinology for hypothyroid and management of hypercalcemia. She states that she has occasional low back pain. In the past, naproxen take as needed has helped her to manage the pain and continue with her activities of daily living. She is due to have routine, fasting labs. She is also due to have screening mammogram.     Current Medication: Outpatient Encounter Medications as of 11/14/2019  Medication Sig  . albuterol (VENTOLIN HFA) 108 (90 Base) MCG/ACT inhaler Inhale 2 puffs into the lungs every 6 (six) hours as needed for wheezing or shortness of breath.  . carvedilol (COREG) 6.25 MG tablet Take 1 tablet (6.25 mg total) by mouth 2 (two) times daily with a meal.  . cetirizine (ZYRTEC) 10 MG tablet Take 1 tablet (10 mg total) by mouth daily.  . Cholecalciferol (VITAMIN D3) 3000 units TABS Take 3,000 Units by mouth daily.   . cinacalcet (SENSIPAR) 30 MG tablet Take 30 mg by mouth once a week.   . clobetasol ointment (TEMOVATE) 2.35 % Apply 1 application topically 2 (two) times daily as needed (rash).   . cloNIDine (CATAPRES) 0.1 MG tablet Take 1 tablet (0.1 mg total) by mouth 2 (two) times daily.  . fluticasone (FLONASE) 50 MCG/ACT nasal spray Place 2 sprays into both nostrils daily.  . fluticasone (FLOVENT HFA) 110 MCG/ACT inhaler Take 2 puffs at bed time  (Patient taking differently: Inhale 2 puffs into the lungs at bedtime as needed (shortness of breath). )  . hydrALAZINE (APRESOLINE) 50 MG tablet Take 2 tablets (100 mg total) by mouth 3 (three) times daily.  Marland Kitchen latanoprost (XALATAN) 0.005 % ophthalmic solution Place 1 drop into both eyes at bedtime.   Marland Kitchen levothyroxine (SYNTHROID, LEVOTHROID) 88 MCG tablet Take 88 mcg by mouth daily before breakfast.   . losartan (COZAAR) 100 MG tablet Take 1 tablet (100 mg total) by mouth daily.  . montelukast (SINGULAIR) 10 MG tablet Take 1 tablet (10 mg total) by mouth daily.  . Multiple Vitamins-Minerals (MULTIVITAMIN GUMMIES WOMENS) CHEW Chew 2 tablets by mouth daily.  . ondansetron (ZOFRAN) 8 MG tablet Take by mouth every 8 (eight) hours as needed for nausea or vomiting.  . pantoprazole (PROTONIX) 40 MG tablet TAKE 1 TABLET BY MOUTH AS NEEDED  . pravastatin (PRAVACHOL) 20 MG tablet TAKE 1 TABLET(20 MG) BY MOUTH EVERY NIGHT  . RESTASIS 0.05 % ophthalmic emulsion Place 1 drop into both eyes 2 (two) times daily.  . timolol (TIMOPTIC) 0.25 % ophthalmic solution Place 1 drop into both eyes 2 (two) times daily.   . [DISCONTINUED] furosemide (LASIX) 20 MG tablet Take 2 tablets (40 mg total) by mouth daily.  . [DISCONTINUED] triamcinolone (KENALOG) 0.025 % cream Apply 1 application topically 2 (two) times daily.  . colchicine 0.6 MG tablet Take 1 tablet (0.6 mg total) by mouth daily.  . naproxen (  NAPROSYN) 500 MG tablet Take 1 tablet (500 mg total) by mouth 2 (two) times daily with a meal.  . triamcinolone ointment (KENALOG) 0.1 % Apply 1 application topically 2 (two) times daily.   No facility-administered encounter medications on file as of 11/14/2019.    Surgical History: Past Surgical History:  Procedure Laterality Date  . EYE SURGERY Right 07/09/2019  . GALLBLADDER SURGERY    . KNEE SURGERY    . thyroidectomy      Medical History: Past Medical History:  Diagnosis Date  . Hypertension   . Thyroid  disease     Family History: Family History  Problem Relation Age of Onset  . Hypertension Mother   . Diabetes Mother   . Anuerysm Mother   . Diabetes Father   . Hypertension Father       Review of Systems  Constitutional: Negative for activity change, chills, fatigue and unexpected weight change.  HENT: Negative for congestion, postnasal drip, rhinorrhea, sneezing and sore throat.   Respiratory: Negative for cough, chest tightness, shortness of breath and wheezing.   Cardiovascular: Negative for chest pain and palpitations.  Gastrointestinal: Negative for abdominal pain, constipation, diarrhea, nausea and vomiting.  Endocrine: Negative for cold intolerance, heat intolerance, polydipsia and polyuria.       Patient sees endocrinology for hypothyroid and hypercalcemia.  Genitourinary: Negative for dysuria, frequency and urgency.  Musculoskeletal: Positive for back pain and myalgias. Negative for arthralgias, joint swelling and neck pain.  Skin: Negative for rash.  Allergic/Immunologic: Negative for environmental allergies.  Neurological: Negative for dizziness, tremors, numbness and headaches.  Hematological: Negative for adenopathy. Does not bruise/bleed easily.  Psychiatric/Behavioral: Negative for behavioral problems (Depression), sleep disturbance and suicidal ideas. The patient is not nervous/anxious.      Today's Vitals   11/14/19 1112  BP: (!) 134/53  Pulse: 61  Resp: 16  Temp: 98.6 F (37 C)  SpO2: 95%  Weight: 149 lb 6.4 oz (67.8 kg)  Height: 4\' 9"  (1.448 m)   Body mass index is 32.33 kg/m.  Physical Exam Vitals and nursing note reviewed.  Constitutional:      General: She is not in acute distress.    Appearance: Normal appearance. She is well-developed. She is not diaphoretic.  HENT:     Head: Normocephalic and atraumatic.     Nose: Nose normal.     Mouth/Throat:     Pharynx: No oropharyngeal exudate.  Eyes:     Pupils: Pupils are equal, round, and  reactive to light.  Neck:     Thyroid: No thyromegaly.     Vascular: No carotid bruit or JVD.     Trachea: No tracheal deviation.  Cardiovascular:     Rate and Rhythm: Normal rate and regular rhythm.     Pulses: Normal pulses.     Heart sounds: Murmur heard.  No friction rub. No gallop.   Pulmonary:     Effort: Pulmonary effort is normal. No respiratory distress.     Breath sounds: Normal breath sounds. No wheezing or rales.  Chest:     Chest wall: No tenderness.  Abdominal:     General: Bowel sounds are normal.     Palpations: Abdomen is soft.     Tenderness: There is no abdominal tenderness.  Musculoskeletal:        General: Normal range of motion.     Cervical back: Normal range of motion and neck supple.     Comments: Mild, intermittent lower back pain which gets worse  with bending and twisting at the waist. There are no palpable abnormalities or deformities noted at this time .  Lymphadenopathy:     Cervical: No cervical adenopathy.  Skin:    General: Skin is warm and dry.     Comments: Some patches of dry skin which are itchy and flaky.   Neurological:     General: No focal deficit present.     Mental Status: She is alert and oriented to person, place, and time.     Cranial Nerves: No cranial nerve deficit.  Psychiatric:        Mood and Affect: Mood normal.        Behavior: Behavior normal.        Thought Content: Thought content normal.        Judgment: Judgment normal.    Depression screen [facility] 2/9 11/14/2019 11/14/2019 10/31/2018 05/06/2018 01/24/2018  Decreased Interest 0 0 0 0 0  Down, Depressed, Hopeless 0 0 0 0 0  PHQ - 2 Score 0 0 0 0 0    Functional Status Survey: Is the patient deaf or have difficulty hearing?: No Does the patient have difficulty seeing, even when wearing glasses/contacts?: Yes Does the patient have difficulty concentrating, remembering, or making decisions?: No Does the patient have difficulty walking or climbing stairs?: No Does the  patient have difficulty dressing or bathing?: No Does the patient have difficulty doing errands alone such as visiting a doctor's office or shopping?: No  MMSE - Mini Mental State Exam 11/14/2019 10/31/2018 10/26/2017  Orientation to time 5 5 5   Orientation to Place 5 5 5   Registration 3 3 3   Attention/ Calculation 5 5 5   Recall 3 3 3   Language- name 2 objects 2 2 2   Language- repeat 1 1 1   Language- follow 3 step command 0 3 3  Language- read & follow direction 1 1 1   Write a sentence 1 1 0  Copy design 1 1 1   Total score 27 30 29     Fall Risk  11/14/2019 11/14/2019 10/31/2018 05/06/2018 01/24/2018  Falls in the past year? 0 0 0 0 0      LABS: Recent Results (from the past 2160 hour(s))  UA/M w/rflx Culture, Routine     Status: Abnormal   Collection Time: 11/14/19  3:15 PM   Specimen: Urine   Urine  Result Value Ref Range   Specific Gravity, UA 1.009 1.005 - 1.030   pH, UA 6.5 5.0 - 7.5   Color, UA Yellow Yellow   Appearance Ur Clear Clear   Leukocytes,UA Negative Negative   Protein,UA 1+ (A) Negative/Trace   Glucose, UA Negative Negative   Ketones, UA Negative Negative   RBC, UA Negative Negative   Bilirubin, UA Negative Negative   Urobilinogen, Ur 0.2 0.2 - 1.0 mg/dL   Nitrite, UA Negative Negative   Microscopic Examination See below:     Comment: Microscopic was indicated and was performed.   Urinalysis Reflex Comment     Comment: This specimen will not reflex to a Urine Culture.  Microscopic Examination     Status: None   Collection Time: 11/14/19  3:15 PM   Urine  Result Value Ref Range   WBC, UA None seen 0 - 5 /hpf   RBC None seen 0 - 2 /hpf   Epithelial Cells (non renal) None seen 0 - 10 /hpf   Casts None seen None seen /lpf   Bacteria, UA None seen None seen/Few    Assessment/Plan:  1. Encounter for general adult medical examination with abnormal findings Annual health maintenance exam. Order slip given to have routine, fasting labs drawn.  2. Essential  hypertension Stable. Continue bp medication as prescribed and continue regular visits with cardiology as scheduled.   3. Acquired hypothyroidism contiue regular visits with endocrinology as scheduled.   4. Primary generalized (osteo)arthritis May take naproxen 500mg  up to twice daily as needed for pain/inflammation.  - naproxen (NAPROSYN) 500 MG tablet; Take 1 tablet (500 mg total) by mouth 2 (two) times daily with a meal.  Dispense: 45 tablet; Refill: 3  5. Atopic dermatitis, unspecified type Apply triamcinolone 0.1% ointment to effected areas . - triamcinolone ointment (KENALOG) 0.1 %; Apply 1 application topically 2 (two) times daily.  Dispense: 30 g; Refill: 1  6. Encounter for screening mammogram for malignant neoplasm of breast - MM DIGITAL SCREENING BILATERAL; Future  7. Dysuria - UA/M w/rflx Culture, Routine  General Counseling: shareeka yim understanding of the findings of todays visit and agrees with plan of treatment. I have discussed any further diagnostic evaluation that may be needed or ordered today. We also reviewed her medications today. she has been encouraged to call the office with any questions or concerns that should arise related to todays visit.    Counseling:  Hypertension Counseling:   The following hypertensive lifestyle modification were recommended and discussed:  1. Limiting alcohol intake to less than 1 oz/day of ethanol:(24 oz of beer or 8 oz of wine or 2 oz of 100-proof whiskey). 2. Take baby ASA 81 mg daily. 3. Importance of regular aerobic exercise and losing weight. 4. Reduce dietary saturated fat and cholesterol intake for overall cardiovascular health. 5. Maintaining adequate dietary potassium, calcium, and magnesium intake. 6. Regular monitoring of the blood pressure. 7. Reduce sodium intake to less than 100 mmol/day (less than 2.3 gm of sodium or less than 6 gm of sodium choride)   This patient was seen by [place] with Dr [name] as a part of collaborative care agreement  Orders Placed This Encounter  Procedures  . Microscopic Examination  . MM DIGITAL SCREENING BILATERAL  . UA/M w/rflx Culture, Routine    Meds ordered this encounter  Medications  . naproxen (NAPROSYN) 500 MG tablet    Sig: Take 1 tablet (500 mg total) by mouth 2 (two) times daily with a meal.    Dispense:  45 tablet    Refill:  3    Order Specific Question:   Supervising Provider    Answer:   [name] Gerald  . triamcinolone ointment (KENALOG) 0.1 %    Sig: Apply 1 application topically 2 (two) times daily.    Dispense:  30 g    Refill:  1    Order Specific Question:   Supervising Provider    Answer:   [name] [[id number]]    Total time spent: 65 Minutes  Time spent includes review of chart, medications, test results, and follow up plan with the patient.     [name], MD  Internal Medicine

## 2019-11-15 LAB — UA/M W/RFLX CULTURE, ROUTINE
Bilirubin, UA: NEGATIVE
Glucose, UA: NEGATIVE
Ketones, UA: NEGATIVE
Leukocytes,UA: NEGATIVE
Nitrite, UA: NEGATIVE
RBC, UA: NEGATIVE
Specific Gravity, UA: 1.009 (ref 1.005–1.030)
Urobilinogen, Ur: 0.2 mg/dL (ref 0.2–1.0)
pH, UA: 6.5 (ref 5.0–7.5)

## 2019-11-15 LAB — MICROSCOPIC EXAMINATION
Bacteria, UA: NONE SEEN
Casts: NONE SEEN /lpf
Epithelial Cells (non renal): NONE SEEN /hpf (ref 0–10)
RBC, Urine: NONE SEEN /hpf (ref 0–2)
WBC, UA: NONE SEEN /hpf (ref 0–5)

## 2019-11-17 DIAGNOSIS — H04551 Acquired stenosis of right nasolacrimal duct: Secondary | ICD-10-CM | POA: Diagnosis not present

## 2019-11-25 ENCOUNTER — Other Ambulatory Visit: Payer: Self-pay

## 2019-11-25 DIAGNOSIS — R601 Generalized edema: Secondary | ICD-10-CM

## 2019-11-25 MED ORDER — FUROSEMIDE 20 MG PO TABS: 40.0000 mg | ORAL_TABLET | Freq: Every day | ORAL | 2 refills | Status: DC

## 2019-11-27 DIAGNOSIS — H1089 Other conjunctivitis: Secondary | ICD-10-CM | POA: Diagnosis not present

## 2019-11-27 DIAGNOSIS — H401133 Primary open-angle glaucoma, bilateral, severe stage: Secondary | ICD-10-CM | POA: Diagnosis not present

## 2019-11-30 DIAGNOSIS — M150 Primary generalized (osteo)arthritis: Secondary | ICD-10-CM | POA: Insufficient documentation

## 2019-11-30 DIAGNOSIS — Z1231 Encounter for screening mammogram for malignant neoplasm of breast: Secondary | ICD-10-CM | POA: Insufficient documentation

## 2019-12-03 DIAGNOSIS — J329 Chronic sinusitis, unspecified: Secondary | ICD-10-CM | POA: Diagnosis not present

## 2019-12-03 DIAGNOSIS — H04551 Acquired stenosis of right nasolacrimal duct: Secondary | ICD-10-CM | POA: Diagnosis not present

## 2019-12-09 ENCOUNTER — Other Ambulatory Visit: Payer: Self-pay | Admitting: Nurse Practitioner

## 2019-12-09 DIAGNOSIS — Z1231 Encounter for screening mammogram for malignant neoplasm of breast: Secondary | ICD-10-CM

## 2019-12-10 IMAGING — DX DG CHEST 1V PORT
1 series · 1 of 1 positions shown · non-contrast
Comparison: 04/26/2017 CXR

CLINICAL DATA: Respiratory distress and dyspnea.

EXAM:
PORTABLE CHEST 1 VIEW

[chest ap]
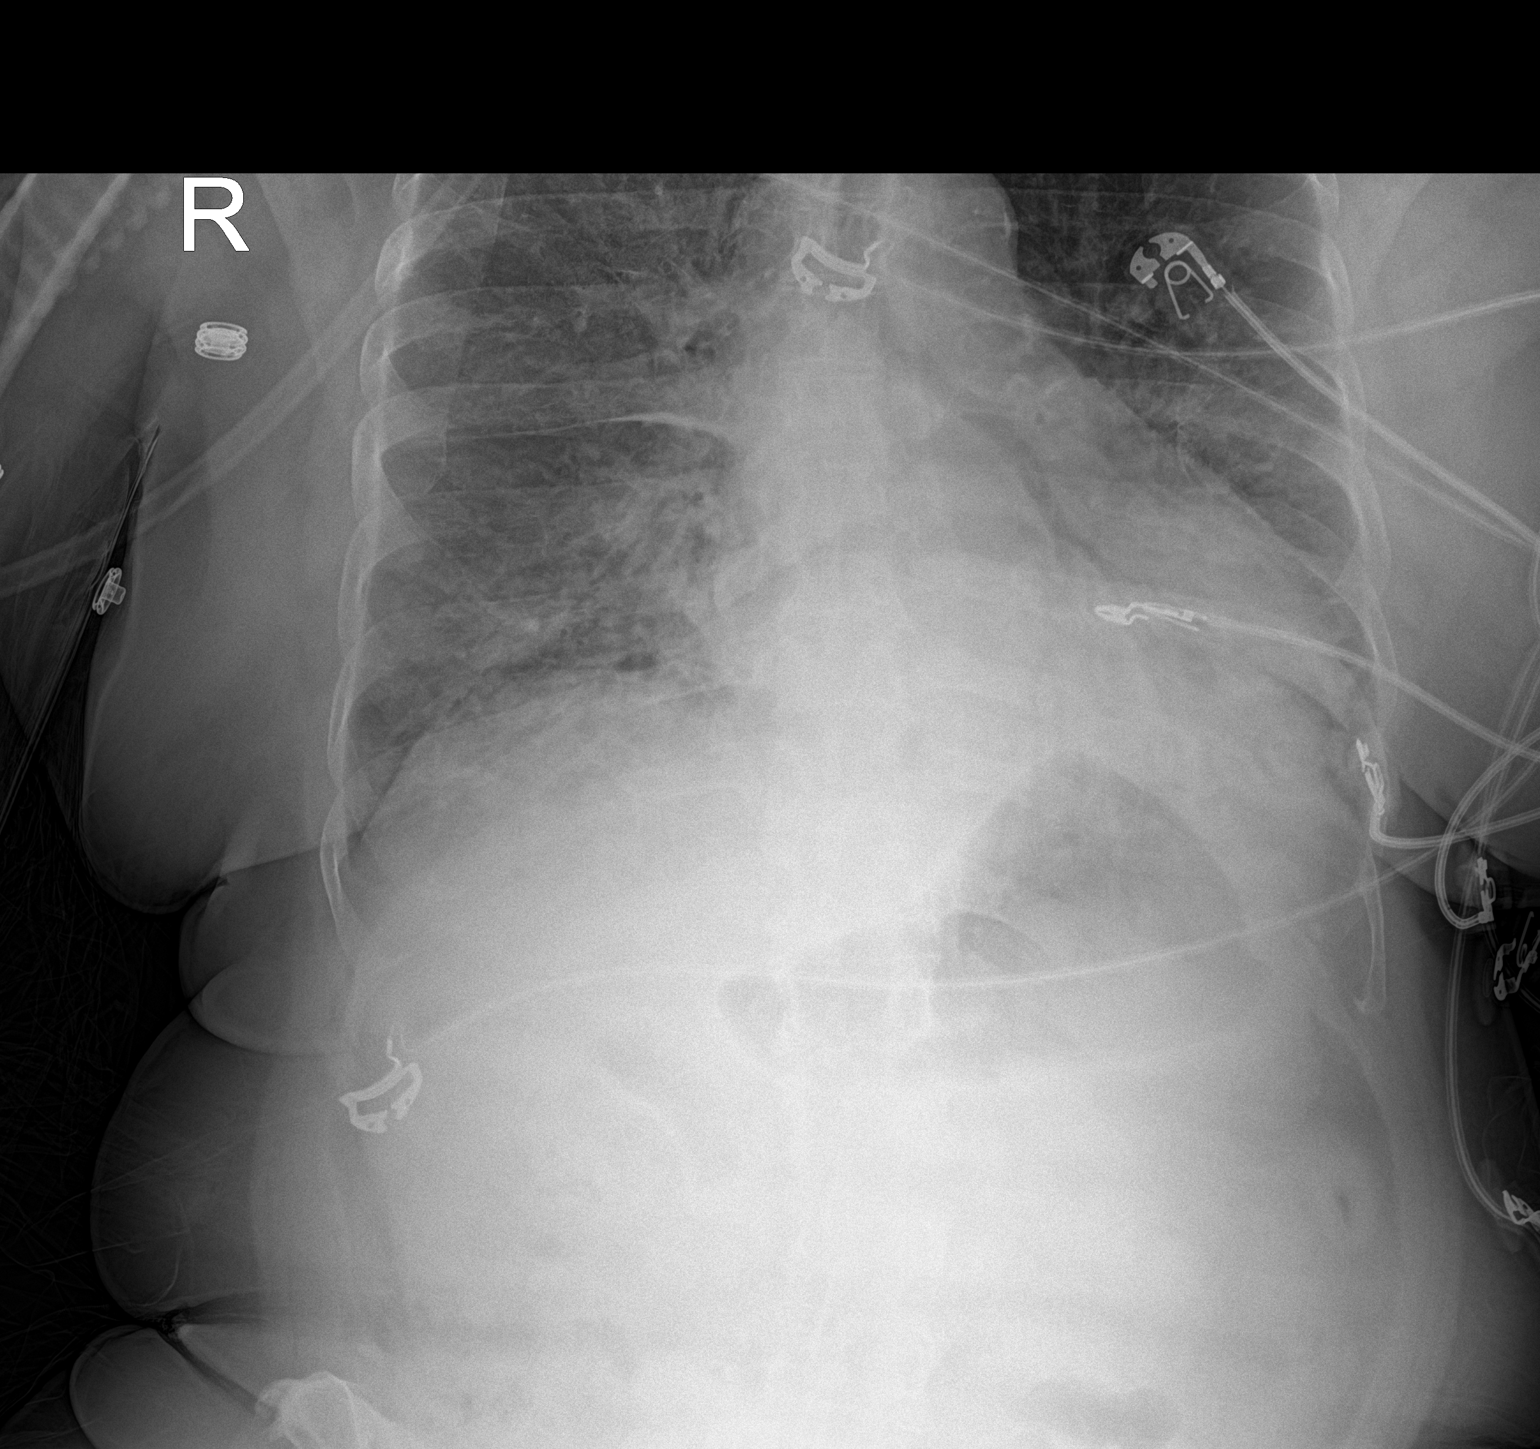

[1 of 1 positions shown; findings below may reference images not displayed]

FINDINGS: Cardiomegaly with interstitial edema. Slightly more confluent
retrocardiac opacities are noted with air bronchograms. Superimposed
pneumonia would be difficult to entirely exclude. Mild aortic
atherosclerosis at the arch. Lung apices are excluded on this AP
portable semi upright view.
IMPRESSION: Cardiomegaly with interstitial edema and mild aortic
atherosclerosis.

Air bronchograms in the retrocardiac portion of the chest cannot
exclude pneumonia.

## 2019-12-26 ENCOUNTER — Other Ambulatory Visit: Payer: Self-pay | Admitting: Nurse Practitioner

## 2019-12-26 DIAGNOSIS — E785 Hyperlipidemia, unspecified: Secondary | ICD-10-CM | POA: Diagnosis not present

## 2019-12-27 LAB — LIPID PANEL W/O CHOL/HDL RATIO
Cholesterol, Total: 179 mg/dL (ref 100–199)
HDL: 49 mg/dL (ref >39)
LDL Chol Calc (NIH): 112 mg/dL — ABNORMAL HIGH (ref 0–99)
Triglycerides: 97 mg/dL (ref 0–149)
VLDL Cholesterol Cal: 18 mg/dL (ref 5–40)

## 2020-01-08 DIAGNOSIS — J329 Chronic sinusitis, unspecified: Secondary | ICD-10-CM | POA: Diagnosis not present

## 2020-01-15 DIAGNOSIS — Z23 Encounter for immunization: Secondary | ICD-10-CM | POA: Diagnosis not present

## 2020-01-15 DIAGNOSIS — E782 Mixed hyperlipidemia: Secondary | ICD-10-CM | POA: Diagnosis not present

## 2020-01-15 DIAGNOSIS — I10 Essential (primary) hypertension: Secondary | ICD-10-CM | POA: Diagnosis not present

## 2020-01-15 DIAGNOSIS — D8685 Sarcoid myocarditis: Secondary | ICD-10-CM | POA: Diagnosis not present

## 2020-01-16 ENCOUNTER — Telehealth: Payer: Self-pay

## 2020-01-16 NOTE — Telephone Encounter (Signed)
Disability parking placard filled out  And ready for pickup at front desk,patient advised.

## 2020-01-19 ENCOUNTER — Other Ambulatory Visit: Payer: Self-pay

## 2020-01-19 ENCOUNTER — Ambulatory Visit
Admission: RE | Admit: 2020-01-19 | Discharge: 2020-01-19 | Disposition: A | Discharge status: Home / Self Care | Payer: Medicare Other | Source: Ambulatory Visit | Attending: Nurse Practitioner | Admitting: Nurse Practitioner

## 2020-01-19 DIAGNOSIS — Z1231 Encounter for screening mammogram for malignant neoplasm of breast: Secondary | ICD-10-CM | POA: Insufficient documentation

## 2020-02-04 DIAGNOSIS — J329 Chronic sinusitis, unspecified: Secondary | ICD-10-CM | POA: Diagnosis not present

## 2020-02-09 ENCOUNTER — Other Ambulatory Visit: Payer: Self-pay

## 2020-02-09 ENCOUNTER — Encounter: Payer: Self-pay | Admitting: Nurse Practitioner

## 2020-02-09 ENCOUNTER — Ambulatory Visit (INDEPENDENT_AMBULATORY_CARE_PROVIDER_SITE_OTHER): Payer: Medicare Other | Admitting: Nurse Practitioner

## 2020-02-09 VITALS — BP 178/100 | HR 69 | Temp 97.9°F | Resp 16 | Ht <= 58 in | Wt 149.2 lb

## 2020-02-09 DIAGNOSIS — I10 Essential (primary) hypertension: Secondary | ICD-10-CM | POA: Diagnosis not present

## 2020-02-09 DIAGNOSIS — E039 Hypothyroidism, unspecified: Secondary | ICD-10-CM

## 2020-02-09 DIAGNOSIS — N183 Chronic kidney disease, stage 3 unspecified: Secondary | ICD-10-CM

## 2020-02-09 DIAGNOSIS — G5762 Lesion of plantar nerve, left lower limb: Secondary | ICD-10-CM

## 2020-02-09 DIAGNOSIS — M792 Neuralgia and neuritis, unspecified: Secondary | ICD-10-CM

## 2020-02-09 MED ORDER — GABAPENTIN 100 MG PO CAPS: ORAL_CAPSULE | ORAL | 3 refills | Status: DC

## 2020-02-09 NOTE — Progress Notes (Signed)
[facility] [place], [place] [address]  Internal MEDICINE  Office Visit Note  Patient Name: Stacey [name]  [id number]  [id number]  Date of Service: 03/14/2020  Chief Complaint  Patient presents with  . Follow-up  . Hypertension  . Quality Metric Gaps    flu  . controlled substance form    reviewed with PT     The patient is here for follow up visit. Blood pressure is high today but she states she was In a rush this morning and did not take her medication. She does see cardiology on routine basis.  She states that that for the past few months, she gets a sharp, burning pain in her right foot. This lasts for about 30 seconds and then resolves on it's own. She states this is happening daily and some days, it happens multiple times each day.  She did have a fasting lipid panel drawn since her last visit. Her LDL was 112 with remainder of the lipid panel normal.       Current Medication: Outpatient Encounter Medications as of 02/09/2020  Medication Sig  . albuterol (VENTOLIN HFA) 108 (90 Base) MCG/ACT inhaler Inhale 2 puffs into the lungs every 6 (six) hours as needed for wheezing or shortness of breath.  . carvedilol (COREG) 6.25 MG tablet Take 1 tablet (6.25 mg total) by mouth 2 (two) times daily with a meal.  . Cholecalciferol (VITAMIN D3) 3000 units TABS Take 3,000 Units by mouth daily.   . cinacalcet (SENSIPAR) 30 MG tablet Take 30 mg by mouth once a week.   . clobetasol ointment (TEMOVATE) 6.72 % Apply 1 application topically 2 (two) times daily as needed (rash).   . cloNIDine (CATAPRES) 0.1 MG tablet Take 1 tablet (0.1 mg total) by mouth 2 (two) times daily.  . fluticasone (FLONASE) 50 MCG/ACT nasal spray Place 2 sprays into both nostrils daily.  . fluticasone (FLOVENT HFA) 110 MCG/ACT inhaler Take 2 puffs at bed time (Patient taking differently: Inhale 2 puffs into the lungs at bedtime as needed (shortness of breath). )  . furosemide (LASIX) 20 MG  tablet Take 2 tablets (40 mg total) by mouth daily.  . hydrALAZINE (APRESOLINE) 50 MG tablet Take 2 tablets (100 mg total) by mouth 3 (three) times daily.  Marland Kitchen latanoprost (XALATAN) 0.005 % ophthalmic solution Place 1 drop into both eyes at bedtime.   Marland Kitchen levothyroxine (SYNTHROID, LEVOTHROID) 88 MCG tablet Take 88 mcg by mouth daily before breakfast.   . losartan (COZAAR) 100 MG tablet Take 1 tablet (100 mg total) by mouth daily.  . montelukast (SINGULAIR) 10 MG tablet Take 1 tablet (10 mg total) by mouth daily.  . Multiple Vitamins-Minerals (MULTIVITAMIN GUMMIES WOMENS) CHEW Chew 2 tablets by mouth daily.  . naproxen (NAPROSYN) 500 MG tablet Take 1 tablet (500 mg total) by mouth 2 (two) times daily with a meal.  . ondansetron (ZOFRAN) 8 MG tablet Take by mouth every 8 (eight) hours as needed for nausea or vomiting.  . pantoprazole (PROTONIX) 40 MG tablet TAKE 1 TABLET BY MOUTH AS NEEDED  . pravastatin (PRAVACHOL) 20 MG tablet TAKE 1 TABLET(20 MG) BY MOUTH EVERY NIGHT  . RESTASIS 0.05 % ophthalmic emulsion Place 1 drop into both eyes 2 (two) times daily.  . timolol (TIMOPTIC) 0.25 % ophthalmic solution Place 1 drop into both eyes 2 (two) times daily.   Marland Kitchen triamcinolone ointment (KENALOG) 0.1 % Apply 1 application topically 2 (two) times daily.  . [DISCONTINUED] cetirizine (  ZYRTEC) 10 MG tablet Take 1 tablet (10 mg total) by mouth daily.  . colchicine 0.6 MG tablet Take 1 tablet (0.6 mg total) by mouth daily.  Marland Kitchen gabapentin (NEURONTIN) 100 MG capsule Take 1 capsule po QPM for one week. May increase to 1 capsule BID as needed and as tolerated.   No facility-administered encounter medications on file as of 02/09/2020.    Surgical History: Past Surgical History:  Procedure Laterality Date  . EYE SURGERY Right 07/09/2019  . GALLBLADDER SURGERY    . KNEE SURGERY    . thyroidectomy      Medical History: Past Medical History:  Diagnosis Date  . Hypertension   . Thyroid disease     Family  History: Family History  Problem Relation Age of Onset  . Hypertension Mother   . Diabetes Mother   . Anuerysm Mother   . Diabetes Father   . Hypertension Father     Social History   Socioeconomic History  . Marital status: Widowed    Spouse name: Not on file  . Number of children: Not on file  . Years of education: Not on file  . Highest education level: Not on file  Occupational History  . Not on file  Tobacco Use  . Smoking status: Never Smoker  . Smokeless tobacco: Never Used  Vaping Use  . Vaping Use: Never used  Substance and Sexual Activity  . Alcohol use: No  . Drug use: Not Currently  . Sexual activity: Not on file  Other Topics Concern  . Not on file  Social History Narrative  . Not on file   Social Determinants of Health   Financial Resource Strain: Not on file  Food Insecurity: Not on file  Transportation Needs: Not on file  Physical Activity: Not on file  Stress: Not on file  Social Connections: Not on file  Intimate Partner Violence: Not on file      Review of Systems  Constitutional: Negative for activity change, chills, fatigue and unexpected weight change.  HENT: Negative for congestion, postnasal drip, rhinorrhea, sneezing and sore throat.   Respiratory: Negative for cough, chest tightness, shortness of breath and wheezing.   Cardiovascular: Negative for chest pain and palpitations.       Elevated blood pressure but has not taken her medication today.   Gastrointestinal: Negative for abdominal pain, constipation, diarrhea, nausea and vomiting.  Endocrine: Negative for cold intolerance, heat intolerance, polydipsia and polyuria.       Patient sees endocrinology for hypothyroid and hypercalcemia.  Musculoskeletal: Positive for back pain and myalgias. Negative for arthralgias, joint swelling and neck pain.  Skin: Negative for rash.  Allergic/Immunologic: Negative for environmental allergies.  Neurological: Negative for dizziness, tremors,  numbness and headaches.  Hematological: Negative for adenopathy. Does not bruise/bleed easily.  Psychiatric/Behavioral: Negative for behavioral problems (Depression), sleep disturbance and suicidal ideas. The patient is not nervous/anxious.     Today's Vitals   02/09/20 1046  BP: (!) 178/100  Pulse: 69  Resp: 16  Temp: 97.9 F (36.6 C)  SpO2: 95%  Weight: 149 lb 3.2 oz (67.7 kg)  Height: 4\' 9"  (1.448 m)   Body mass index is 32.29 kg/m.  Physical Exam Vitals and nursing note reviewed.  Constitutional:      General: She is not in acute distress.    Appearance: Normal appearance. She is well-developed. She is not diaphoretic.  HENT:     Head: Normocephalic and atraumatic.     Nose: Nose normal.  Mouth/Throat:     Pharynx: No oropharyngeal exudate.  Eyes:     Pupils: Pupils are equal, round, and reactive to light.  Neck:     Thyroid: No thyromegaly.     Vascular: No carotid bruit or JVD.     Trachea: No tracheal deviation.  Cardiovascular:     Rate and Rhythm: Normal rate and regular rhythm.     Heart sounds: Murmur heard.  No friction rub. No gallop.   Pulmonary:     Effort: Pulmonary effort is normal. No respiratory distress.     Breath sounds: Normal breath sounds. No wheezing or rales.  Chest:     Chest wall: No tenderness.  Abdominal:     General: Bowel sounds are normal.     Palpations: Abdomen is soft.     Tenderness: There is no abdominal tenderness.  Musculoskeletal:        General: Normal range of motion.     Cervical back: Normal range of motion and neck supple.     Comments: Mild, intermittent lower back pain which gets worse with bending and twisting at the waist. There are no palpable abnormalities or deformities noted at this time .  Lymphadenopathy:     Cervical: No cervical adenopathy.  Skin:    General: Skin is warm and dry.     Comments: Some patches of dry skin which are itchy and flaky.   Neurological:     General: No focal deficit  present.     Mental Status: She is alert and oriented to person, place, and time.     Cranial Nerves: No cranial nerve deficit.  Psychiatric:        Mood and Affect: Mood normal.        Behavior: Behavior normal.        Thought Content: Thought content normal.        Judgment: Judgment normal.    Assessment/Plan:  1. Essential hypertension Generally stable but has not taken blood pressure medication today. She should take meds as soon as she arrives home. She should continue to see cardiology as scheduled.   2. Acquired hypothyroidism Continue regular visits with endocrinology as scheduled.   3. Stage 3 chronic kidney disease, unspecified whether stage 3a or 3b CKD ([place]) Continue regular visits with nephrology as scheduled.   4. Plantar neuroma of left foot Likely cause in increase pain in foot. Add gabapentin 100mg . Start at bedtime. May increase to twice daily as needed and as tolerated.   5. Neuralgia and neuritis Add gabapentin 100mg . Start at bedtime. May increase to twice daily as needed and as tolerated.  - gabapentin (NEURONTIN) 100 MG capsule; Take 1 capsule po QPM for one week. May increase to 1 capsule BID as needed and as tolerated.  Dispense: 60 capsule; Refill: 3 General Counseling: Basha verbalizes understanding of the findings of todays visit and agrees with plan of treatment. I have discussed any further diagnostic evaluation that may be needed or ordered today. We also reviewed her medications today. she has been encouraged to call the office with any questions or concerns that should arise related to todays visit.  Hypertension Counseling:   The following hypertensive lifestyle modification were recommended and discussed:  1. Limiting alcohol intake to less than 1 oz/day of ethanol:(24 oz of beer or 8 oz of wine or 2 oz of 100-proof whiskey). 2. Take baby ASA 81 mg daily. 3. Importance of regular aerobic exercise and losing weight. 4. Reduce dietary saturated  fat and cholesterol intake for overall cardiovascular health. 5. Maintaining adequate dietary potassium, calcium, and magnesium intake. 6. Regular monitoring of the blood pressure. 7. Reduce sodium intake to less than 100 mmol/day (less than 2.3 gm of sodium or less than 6 gm of sodium choride)   This patient was seen by [place] with Dr [name] as a part of collaborative care agreement  Meds ordered this encounter  Medications  . gabapentin (NEURONTIN) 100 MG capsule    Sig: Take 1 capsule po QPM for one week. May increase to 1 capsule BID as needed and as tolerated.    Dispense:  60 capsule    Refill:  3    Order Specific Question:   Supervising Provider    Answer:   [name] [[id number]]    Total time spent: 30 Minutes   Time spent includes review of chart, medications, test results, and follow up plan with the patient.      Dr [name] Internal medicine

## 2020-03-01 DIAGNOSIS — H33313 Horseshoe tear of retina without detachment, bilateral: Secondary | ICD-10-CM | POA: Diagnosis not present

## 2020-03-01 DIAGNOSIS — H35353 Cystoid macular degeneration, bilateral: Secondary | ICD-10-CM | POA: Diagnosis not present

## 2020-03-05 ENCOUNTER — Other Ambulatory Visit: Payer: Self-pay

## 2020-03-05 DIAGNOSIS — J309 Allergic rhinitis, unspecified: Secondary | ICD-10-CM

## 2020-03-05 MED ORDER — CETIRIZINE HCL 10 MG PO TABS: 10.0000 mg | ORAL_TABLET | Freq: Every day | ORAL | 4 refills | Status: DC

## 2020-03-14 DIAGNOSIS — M792 Neuralgia and neuritis, unspecified: Secondary | ICD-10-CM | POA: Insufficient documentation

## 2020-03-23 ENCOUNTER — Other Ambulatory Visit: Payer: Self-pay

## 2020-03-23 ENCOUNTER — Encounter: Payer: Self-pay | Admitting: Hospice and Palliative Medicine

## 2020-03-23 ENCOUNTER — Ambulatory Visit (INDEPENDENT_AMBULATORY_CARE_PROVIDER_SITE_OTHER): Payer: Medicare Other | Admitting: Hospice and Palliative Medicine

## 2020-03-23 VITALS — BP 138/82 | HR 77 | Temp 98.1°F | Resp 16 | Ht <= 58 in | Wt 149.4 lb

## 2020-03-23 DIAGNOSIS — I10 Essential (primary) hypertension: Secondary | ICD-10-CM

## 2020-03-23 DIAGNOSIS — J3089 Other allergic rhinitis: Secondary | ICD-10-CM

## 2020-03-23 DIAGNOSIS — J300 Vasomotor rhinitis: Secondary | ICD-10-CM | POA: Diagnosis not present

## 2020-03-23 DIAGNOSIS — B028 Zoster with other complications: Secondary | ICD-10-CM

## 2020-03-23 MED ORDER — FLUTICASONE PROPIONATE 50 MCG/ACT NA SUSP: 2.0000 | Freq: Every day | NASAL | 6 refills | Status: AC

## 2020-03-23 MED ORDER — GABAPENTIN 100 MG PO CAPS: ORAL_CAPSULE | ORAL | 3 refills | Status: DC

## 2020-03-23 MED ORDER — VALACYCLOVIR HCL 1 G PO TABS: 1000.0000 mg | ORAL_TABLET | Freq: Two times a day (BID) | ORAL | 0 refills | Status: DC

## 2020-03-23 NOTE — Progress Notes (Signed)
[facility] [place], [place] [address]  Internal MEDICINE  Office Visit Note  Patient Name: Stacey [name]  [id number]  [id number]  Date of Service: 03/23/2020  Chief Complaint  Patient presents with  . Rash    Right arm Also has a bump on tip of nose.  Noticed about 3-5 days ago  . Cough    Maybe due to drainage in throat.  Has hoarseness by throat not sore x 3-4 months.    HPI  Patient is here for routine follow-up Follow-up for elevated BP readings at last visit, since last visit she has been taking medication on a daily basis  Noticed a burning in the right side of her neck on Monday evening last week 12/27--felt as though she slept on it wrong, burning continued through the week, on Wednesday she developed a rash on her right arm Rash started out on top of arm--has now extended to lower arm Rash is pruritic and a deep burning, continues to have burning to right side of her neck but rash is not present in that area  Continues to complain of having allergy symptoms--takes Zyrtec daily, noticed her symptoms started when we were having warm weather earlier in December Symptoms include rhinorrhea, nasal drainage and eye watering She says by the end of the day her voice is hoarse from the drainage  Current Medication: Outpatient Encounter Medications as of 03/23/2020  Medication Sig  . albuterol (VENTOLIN HFA) 108 (90 Base) MCG/ACT inhaler Inhale 2 puffs into the lungs every 6 (six) hours as needed for wheezing or shortness of breath.  . carvedilol (COREG) 6.25 MG tablet Take 1 tablet (6.25 mg total) by mouth 2 (two) times daily with a meal.  . cetirizine (ZYRTEC) 10 MG tablet Take 1 tablet (10 mg total) by mouth daily.  . Cholecalciferol (VITAMIN D3) 3000 units TABS Take 3,000 Units by mouth daily.   . cinacalcet (SENSIPAR) 30 MG tablet Take 30 mg by mouth once a week.   . clobetasol ointment (TEMOVATE) [id number] % Apply 1 application topically 2 (two) times  daily as needed (rash).   . cloNIDine (CATAPRES) 0.1 MG tablet Take 1 tablet (0.1 mg total) by mouth 2 (two) times daily.  . fluticasone (FLOVENT HFA) 110 MCG/ACT inhaler Take 2 puffs at bed time (Patient taking differently: Inhale 2 puffs into the lungs at bedtime as needed (shortness of breath).)  . furosemide (LASIX) 20 MG tablet Take 2 tablets (40 mg total) by mouth daily.  . hydrALAZINE (APRESOLINE) 50 MG tablet Take 2 tablets (100 mg total) by mouth 3 (three) times daily.  Marland Kitchen latanoprost (XALATAN) 0.005 % ophthalmic solution Place 1 drop into both eyes at bedtime.   Marland Kitchen levothyroxine (SYNTHROID, LEVOTHROID) 88 MCG tablet Take 88 mcg by mouth daily before breakfast.   . losartan (COZAAR) 100 MG tablet Take 1 tablet (100 mg total) by mouth daily.  . montelukast (SINGULAIR) 10 MG tablet Take 1 tablet (10 mg total) by mouth daily.  . Multiple Vitamins-Minerals (MULTIVITAMIN GUMMIES WOMENS) CHEW Chew 2 tablets by mouth daily.  . naproxen (NAPROSYN) 500 MG tablet Take 1 tablet (500 mg total) by mouth 2 (two) times daily with a meal.  . ondansetron (ZOFRAN) 8 MG tablet Take by mouth every 8 (eight) hours as needed for nausea or vomiting.  . pantoprazole (PROTONIX) 40 MG tablet TAKE 1 TABLET BY MOUTH AS NEEDED  . pravastatin (PRAVACHOL) 20 MG tablet TAKE 1 TABLET(20 MG) BY MOUTH EVERY NIGHT  .  RESTASIS 0.05 % ophthalmic emulsion Place 1 drop into both eyes 2 (two) times daily.  . timolol (TIMOPTIC) 0.25 % ophthalmic solution Place 1 drop into both eyes 2 (two) times daily.   Marland Kitchen triamcinolone ointment (KENALOG) 0.1 % Apply 1 application topically 2 (two) times daily.  . valACYclovir (VALTREX) 1000 MG tablet Take 1 tablet (1,000 mg total) by mouth 2 (two) times daily.  . [DISCONTINUED] fluticasone (FLONASE) 50 MCG/ACT nasal spray Place 2 sprays into both nostrils daily.  . [DISCONTINUED] gabapentin (NEURONTIN) 100 MG capsule Take 1 capsule po QPM for one week. May increase to 1 capsule BID as needed and  as tolerated.  . colchicine 0.6 MG tablet Take 1 tablet (0.6 mg total) by mouth daily.  . fluticasone (FLONASE) 50 MCG/ACT nasal spray Place 2 sprays into both nostrils daily.  Marland Kitchen gabapentin (NEURONTIN) 100 MG capsule Take 1 capsule po QPM for one week. May increase to 1 capsule BID as needed and as tolerated.   No facility-administered encounter medications on file as of 03/23/2020.    Surgical History: Past Surgical History:  Procedure Laterality Date  . EYE SURGERY Right 07/09/2019  . GALLBLADDER SURGERY    . KNEE SURGERY    . thyroidectomy      Medical History: Past Medical History:  Diagnosis Date  . Hypertension   . Thyroid disease     Family History: Family History  Problem Relation Age of Onset  . Hypertension Mother   . Diabetes Mother   . Anuerysm Mother   . Diabetes Father   . Hypertension Father     Social History   Socioeconomic History  . Marital status: Widowed    Spouse name: Not on file  . Number of children: Not on file  . Years of education: Not on file  . Highest education level: Not on file  Occupational History  . Not on file  Tobacco Use  . Smoking status: Never Smoker  . Smokeless tobacco: Never Used  Vaping Use  . Vaping Use: Never used  Substance and Sexual Activity  . Alcohol use: No  . Drug use: Not Currently  . Sexual activity: Not on file  Other Topics Concern  . Not on file  Social History Narrative  . Not on file   Social Determinants of Health   Financial Resource Strain: Not on file  Food Insecurity: Not on file  Transportation Needs: Not on file  Physical Activity: Not on file  Stress: Not on file  Social Connections: Not on file  Intimate Partner Violence: Not on file      Review of Systems  Constitutional: Negative for chills, diaphoresis and fatigue.  HENT: Positive for postnasal drip, rhinorrhea, sneezing and voice change. Negative for ear pain and sinus pressure.   Eyes: Positive for discharge and itching.  Negative for photophobia, redness and visual disturbance.  Respiratory: Negative for cough, shortness of breath and wheezing.   Cardiovascular: Negative for chest pain, palpitations and leg swelling.  Gastrointestinal: Negative for abdominal pain, constipation, diarrhea, nausea and vomiting.  Genitourinary: Negative for dysuria and flank pain.  Musculoskeletal: Negative for arthralgias, back pain, gait problem and neck pain.  Skin: Negative for color change.       Rash to right arm upper and lower arm, burning and pruritic rash  Allergic/Immunologic: Negative for environmental allergies and food allergies.  Neurological: Negative for dizziness and headaches.  Hematological: Does not bruise/bleed easily.  Psychiatric/Behavioral: Negative for agitation, behavioral problems (depression) and hallucinations.  Vital Signs: BP 138/82   Pulse 77   Temp 98.1 F (36.7 C)   Resp 16   Ht 4\' 9"  (1.448 m)   Wt 149 lb 6.4 oz (67.8 kg)   SpO2 97%   BMI 32.33 kg/m    Physical Exam Constitutional:      Appearance: Normal appearance. She is obese.  HENT:     Nose: Rhinorrhea present.  Eyes:     General:        Right eye: Discharge present.        Left eye: Discharge present.    Comments: Clear discharge to bilateral eyes  Cardiovascular:     Rate and Rhythm: Normal rate and regular rhythm.     Pulses: Normal pulses.     Heart sounds: Normal heart sounds.  Pulmonary:     Effort: Pulmonary effort is normal.     Breath sounds: Normal breath sounds.  Abdominal:     General: Abdomen is flat.     Palpations: Abdomen is soft.  Musculoskeletal:        General: Normal range of motion.  Skin:    Findings: Rash present.     Comments: Vesicular rash to right upper and lower arm Apparent pruritic changes to rash and surrounding skin Lesions in various stages of healing  Neurological:     General: No focal deficit present.     Mental Status: She is alert and oriented to person, place, and  time. Mental status is at baseline.  Psychiatric:        Mood and Affect: Mood normal.        Behavior: Behavior normal.        Thought Content: Thought content normal.        Judgment: Judgment normal.    Assessment/Plan: 1. Herpes zoster with other complication Shingles with neuralgia pain, treat with anti-viral therapy, already prescribed gabapentin but has not been taking Adjusted dosing for neuralgia pain for short term therapy - valACYclovir (VALTREX) 1000 MG tablet; Take 1 tablet (1,000 mg total) by mouth 2 (two) times daily.  Dispense: 20 tablet; Refill: 0 - gabapentin (NEURONTIN) 100 MG capsule; Take 1 capsule po QPM for one week. May increase to 1 capsule BID as needed and as tolerated.  Dispense: 60 capsule; Refill: 3  2. Environmental and seasonal allergies Advised to use Flonase daily along with Zyrtec, once anti-viral therapy is completed may consider allergy testing to severity and extended time of symptoms - fluticasone (FLONASE) 50 MCG/ACT nasal spray; Place 2 sprays into both nostrils daily.  Dispense: 16 g; Refill: 6  3. Vasomotor rhinitis Use Flonase daily to help with symptom management - fluticasone (FLONASE) 50 MCG/ACT nasal spray; Place 2 sprays into both nostrils daily.  Dispense: 16 g; Refill: 6  4. Essential hypertension BP improved significantly on manual recheck-encouraged continued daily adherence to medications  General Counseling: Priscille Kluverlaine verbalizes understanding of the findings of todays visit and agrees with plan of treatment. I have discussed any further diagnostic evaluation that may be needed or ordered today. We also reviewed her medications today. she has been encouraged to call the office with any questions or concerns that should arise related to todays visit.  Meds ordered this encounter  Medications  . valACYclovir (VALTREX) 1000 MG tablet    Sig: Take 1 tablet (1,000 mg total) by mouth 2 (two) times daily.    Dispense:  20 tablet    Refill:   0  . gabapentin (NEURONTIN) 100 MG capsule  Sig: Take 1 capsule po QPM for one week. May increase to 1 capsule BID as needed and as tolerated.    Dispense:  60 capsule    Refill:  3  . fluticasone (FLONASE) 50 MCG/ACT nasal spray    Sig: Place 2 sprays into both nostrils daily.    Dispense:  16 g    Refill:  6    Time spent: 30 Minutes Time spent includes review of chart, medications, test results and follow-up plan with the patient.  This patient was seen by Leeanne Deed AGNP-C in Collaboration with Dr Lyndon Code as a part of collaborative care agreement     Lubertha Basque. Tuwana Kapaun AGNP-C Internal medicine

## 2020-03-30 DIAGNOSIS — E782 Mixed hyperlipidemia: Secondary | ICD-10-CM | POA: Diagnosis not present

## 2020-03-30 DIAGNOSIS — E039 Hypothyroidism, unspecified: Secondary | ICD-10-CM | POA: Diagnosis not present

## 2020-04-06 DIAGNOSIS — M810 Age-related osteoporosis without current pathological fracture: Secondary | ICD-10-CM | POA: Diagnosis not present

## 2020-04-06 DIAGNOSIS — E039 Hypothyroidism, unspecified: Secondary | ICD-10-CM | POA: Diagnosis not present

## 2020-04-06 DIAGNOSIS — N1831 Chronic kidney disease, stage 3a: Secondary | ICD-10-CM | POA: Diagnosis not present

## 2020-04-06 DIAGNOSIS — Z23 Encounter for immunization: Secondary | ICD-10-CM | POA: Diagnosis not present

## 2020-04-06 DIAGNOSIS — E210 Primary hyperparathyroidism: Secondary | ICD-10-CM | POA: Diagnosis not present

## 2020-04-28 ENCOUNTER — Other Ambulatory Visit: Payer: Self-pay

## 2020-04-28 DIAGNOSIS — J309 Allergic rhinitis, unspecified: Secondary | ICD-10-CM

## 2020-04-28 DIAGNOSIS — I10 Essential (primary) hypertension: Secondary | ICD-10-CM

## 2020-04-28 MED ORDER — LOSARTAN POTASSIUM 100 MG PO TABS: 100.0000 mg | ORAL_TABLET | Freq: Every day | ORAL | 1 refills | Status: DC

## 2020-04-28 MED ORDER — MONTELUKAST SODIUM 10 MG PO TABS: 10.0000 mg | ORAL_TABLET | Freq: Every day | ORAL | 3 refills | Status: DC

## 2020-04-29 DIAGNOSIS — H401133 Primary open-angle glaucoma, bilateral, severe stage: Secondary | ICD-10-CM | POA: Diagnosis not present

## 2020-05-17 ENCOUNTER — Ambulatory Visit (INDEPENDENT_AMBULATORY_CARE_PROVIDER_SITE_OTHER): Payer: Medicare Other | Admitting: Physician Assistant

## 2020-05-17 ENCOUNTER — Encounter: Payer: Self-pay | Admitting: Physician Assistant

## 2020-05-17 ENCOUNTER — Other Ambulatory Visit: Payer: Self-pay

## 2020-05-17 DIAGNOSIS — E039 Hypothyroidism, unspecified: Secondary | ICD-10-CM | POA: Diagnosis not present

## 2020-05-17 DIAGNOSIS — G8929 Other chronic pain: Secondary | ICD-10-CM | POA: Diagnosis not present

## 2020-05-17 DIAGNOSIS — Z9114 Patient's other noncompliance with medication regimen: Secondary | ICD-10-CM

## 2020-05-17 DIAGNOSIS — I10 Essential (primary) hypertension: Secondary | ICD-10-CM

## 2020-05-17 DIAGNOSIS — B028 Zoster with other complications: Secondary | ICD-10-CM

## 2020-05-17 DIAGNOSIS — M792 Neuralgia and neuritis, unspecified: Secondary | ICD-10-CM | POA: Diagnosis not present

## 2020-05-17 DIAGNOSIS — Z91148 Patient's other noncompliance with medication regimen for other reason: Secondary | ICD-10-CM

## 2020-05-17 DIAGNOSIS — E782 Mixed hyperlipidemia: Secondary | ICD-10-CM

## 2020-05-17 DIAGNOSIS — M545 Low back pain, unspecified: Secondary | ICD-10-CM | POA: Diagnosis not present

## 2020-05-17 NOTE — Progress Notes (Signed)
[facility] [place], [place] [address]  Internal MEDICINE  Office Visit Note  Patient Name: Stacey [name]  [id number]  [id number]  Date of Service: 05/18/2020  Chief Complaint  Patient presents with  . Follow-up    Flu shot and shingles shot  . Hypertension    HPI  -Pt is here for f/u.  -She had shingled back in Jan. She finished valtrex and still has some lingering marks and pain in R arm. Still taking gabapentin which helps. -Eyes water a lot and is seen by eye specialist due to history of glaucoma and cataracts. She has routine f/u with them at [facility]. -BP: 135-140/ 80s at home. Recheck in office improved slightly to 148/76. She does report she does not take medications as prescribed. She is only taking Lasix 20mg  tab once per day not doubled to 40mg . Only taking hydralazine 1 tab in AM and 1 in PM, rather than 2 tabs TID. She is taking the Losartan 100mg , takes coreg BID, and has been doing Clonidine usually 2 tab at night. Educated on the importance of taking the medication as prescribed. She will need to log BP and which meds she is taking at what time to be brought in next visit. -Sees endocrin for her thyroid. -She is taking Pravastatin for cholesterol. -She does have back pain with standing for long periods of time especially if she is cooking. She was taking 500mg  naproxen which would help, but only takes it 1-2x per week and was not taking it prior to these standing periods. She will start taking it beforehand so it can take effect. Will only take as needed though.  Current Medication: Outpatient Encounter Medications as of 05/17/2020  Medication Sig  . albuterol (VENTOLIN HFA) 108 (90 Base) MCG/ACT inhaler Inhale 2 puffs into the lungs every 6 (six) hours as needed for wheezing or shortness of breath.  . carvedilol (COREG) 6.25 MG tablet Take 1 tablet (6.25 mg total) by mouth 2 (two) times daily with a meal.  . cetirizine (ZYRTEC) 10 MG tablet Take 1  tablet (10 mg total) by mouth daily.  . Cholecalciferol (VITAMIN D3) 3000 units TABS Take 3,000 Units by mouth daily.   . cinacalcet (SENSIPAR) 30 MG tablet Take 30 mg by mouth once a week.   . clobetasol ointment (TEMOVATE) 8.85 % Apply 1 application topically 2 (two) times daily as needed (rash).   . cloNIDine (CATAPRES) 0.1 MG tablet Take 1 tablet (0.1 mg total) by mouth 2 (two) times daily.  . colchicine 0.6 MG tablet Take 1 tablet (0.6 mg total) by mouth daily.  . fluticasone (FLONASE) 50 MCG/ACT nasal spray Place 2 sprays into both nostrils daily.  . fluticasone (FLOVENT HFA) 110 MCG/ACT inhaler Take 2 puffs at bed time (Patient taking differently: Inhale 2 puffs into the lungs at bedtime as needed (shortness of breath).)  . furosemide (LASIX) 20 MG tablet Take 2 tablets (40 mg total) by mouth daily.  Marland Kitchen gabapentin (NEURONTIN) 100 MG capsule Take 1 capsule po QPM for one week. May increase to 1 capsule BID as needed and as tolerated.  . hydrALAZINE (APRESOLINE) 50 MG tablet Take 2 tablets (100 mg total) by mouth 3 (three) times daily.  Marland Kitchen latanoprost (XALATAN) 0.005 % ophthalmic solution Place 1 drop into both eyes at bedtime.   Marland Kitchen levothyroxine (SYNTHROID, LEVOTHROID) 88 MCG tablet Take 88 mcg by mouth daily before breakfast.   . Multiple Vitamins-Minerals (MULTIVITAMIN GUMMIES WOMENS) CHEW Chew 2 tablets  by mouth daily.  . naproxen (NAPROSYN) 500 MG tablet Take 1 tablet (500 mg total) by mouth 2 (two) times daily with a meal.  . ondansetron (ZOFRAN) 8 MG tablet Take by mouth every 8 (eight) hours as needed for nausea or vomiting.  . pantoprazole (PROTONIX) 40 MG tablet TAKE 1 TABLET BY MOUTH AS NEEDED  . pravastatin (PRAVACHOL) 20 MG tablet TAKE 1 TABLET(20 MG) BY MOUTH EVERY NIGHT  . RESTASIS 0.05 % ophthalmic emulsion Place 1 drop into both eyes 2 (two) times daily.  . timolol (TIMOPTIC) 0.25 % ophthalmic solution Place 1 drop into both eyes 2 (two) times daily.   Marland Kitchen triamcinolone ointment  (KENALOG) 0.1 % Apply 1 application topically 2 (two) times daily.  . [DISCONTINUED] losartan (COZAAR) 100 MG tablet Take 1 tablet (100 mg total) by mouth daily.  . [DISCONTINUED] montelukast (SINGULAIR) 10 MG tablet Take 1 tablet (10 mg total) by mouth daily.  . [DISCONTINUED] valACYclovir (VALTREX) 1000 MG tablet Take 1 tablet (1,000 mg total) by mouth 2 (two) times daily. (Patient not taking: Reported on 05/17/2020)   No facility-administered encounter medications on file as of 05/17/2020.    Surgical History: Past Surgical History:  Procedure Laterality Date  . EYE SURGERY Right 07/09/2019  . GALLBLADDER SURGERY    . KNEE SURGERY    . thyroidectomy      Medical History: Past Medical History:  Diagnosis Date  . Hypertension   . Thyroid disease     Family History: Family History  Problem Relation Age of Onset  . Hypertension Mother   . Diabetes Mother   . Anuerysm Mother   . Diabetes Father   . Hypertension Father     Social History   Socioeconomic History  . Marital status: Widowed    Spouse name: Not on file  . Number of children: Not on file  . Years of education: Not on file  . Highest education level: Not on file  Occupational History  . Not on file  Tobacco Use  . Smoking status: Never Smoker  . Smokeless tobacco: Never Used  Vaping Use  . Vaping Use: Never used  Substance and Sexual Activity  . Alcohol use: No  . Drug use: Not Currently  . Sexual activity: Not on file  Other Topics Concern  . Not on file  Social History Narrative  . Not on file   Social Determinants of Health   Financial Resource Strain: Not on file  Food Insecurity: Not on file  Transportation Needs: Not on file  Physical Activity: Not on file  Stress: Not on file  Social Connections: Not on file  Intimate Partner Violence: Not on file      Review of Systems  Constitutional: Negative for chills, fatigue and unexpected weight change.  HENT: Negative for congestion,  postnasal drip, rhinorrhea, sneezing and sore throat.   Eyes: Negative for redness.       Watery eyes  Respiratory: Negative for cough, chest tightness and shortness of breath.   Cardiovascular: Negative for chest pain and palpitations.  Gastrointestinal: Negative for abdominal pain, constipation, diarrhea, nausea and vomiting.  Genitourinary: Negative for dysuria and frequency.  Musculoskeletal: Positive for arthralgias and back pain. Negative for joint swelling and neck pain.  Skin: Negative for rash.       Residual marks on R arm from where shingles outbreak occurred  Neurological: Positive for numbness. Negative for tremors.  Hematological: Negative for adenopathy. Does not bruise/bleed easily.  Psychiatric/Behavioral: Negative for behavioral  problems (Depression), sleep disturbance and suicidal ideas. The patient is not nervous/anxious.     Vital Signs: BP (!) 152/78   Pulse 70   Temp (!) 97 F (36.1 C)   Resp 16   Ht 4\' 9"  (1.448 m)   Wt 150 lb (68 kg)   SpO2 97%   BMI 32.46 kg/m    Physical Exam Constitutional:      General: She is not in acute distress.    Appearance: She is well-developed. She is obese. She is not diaphoretic.  HENT:     Head: Normocephalic and atraumatic.     Mouth/Throat:     Pharynx: No oropharyngeal exudate.  Eyes:     Pupils: Pupils are equal, round, and reactive to light.  Neck:     Thyroid: No thyromegaly.     Vascular: No JVD.     Trachea: No tracheal deviation.  Cardiovascular:     Rate and Rhythm: Normal rate and regular rhythm.     Heart sounds: Murmur heard.  No friction rub. No gallop.   Pulmonary:     Effort: Pulmonary effort is normal. No respiratory distress.     Breath sounds: No wheezing or rales.  Chest:     Chest wall: No tenderness.  Abdominal:     General: Bowel sounds are normal.     Palpations: Abdomen is soft.  Musculoskeletal:        General: Normal range of motion.     Cervical back: Normal range of motion  and neck supple.     Comments: Low back pain with prolonged standing, non TTP  Lymphadenopathy:     Cervical: No cervical adenopathy.  Skin:    General: Skin is warm and dry.     Comments: Discoloration along R arm from shingles outbreak  Neurological:     Mental Status: She is alert and oriented to person, place, and time.     Cranial Nerves: No cranial nerve deficit.     Sensory: Sensory deficit present.     Comments: Post herpetic pain in R arm  Psychiatric:        Behavior: Behavior normal.        Thought Content: Thought content normal.        Judgment: Judgment normal.        Assessment/Plan: 1. Herpes zoster with other complication Resolved. Completed Valtrex, continue to have some discoloration long R arm and mild post herpetic pain that is controlled with gabapentin.  2. Essential hypertension Elevated in office. Per pt she has not been taking all of her medications as prescribed. Can therefore not determine if appropriate therapy at this time. Pt educated on importance of medication adherence and will do better with this. Pt given log to record BP with which medications she has taken.  3. Acquired hypothyroidism Followed by endocrinology.  4. Neuralgia and neuritis Continue gabapentin as needed.  5. Mixed hyperlipidemia Continue pravastatin daily.  6. Chronic bilateral low back pain without sciatica Continue Naproxen 500mg  as needed prior to long periods of standing/cooking that are known triggers for her pain.  7. Nonadherence to medication Pt educated regarding importance of adhering to medications especially since BP remains elevated. Pt expressed understanding and will do better about taking medications as prescribed.    General Counseling: latandra loureiro understanding of the findings of todays visit and agrees with plan of treatment. I have discussed any further diagnostic evaluation that may be needed or ordered today. We also reviewed her medications  today. she has been encouraged to call the office with any questions or concerns that should arise related to todays visit.    No orders of the defined types were placed in this encounter.   No orders of the defined types were placed in this encounter.   This patient was seen by Drema Dallas, PA-C in collaboration with Dr. Clayborn Bigness as a part of collaborative care agreement.   Total time spent:30 Minutes Time spent includes review of chart, medications, test results, and follow up plan with the patient.      Dr [name] Internal medicine

## 2020-06-14 ENCOUNTER — Other Ambulatory Visit: Payer: Self-pay

## 2020-06-14 ENCOUNTER — Ambulatory Visit (INDEPENDENT_AMBULATORY_CARE_PROVIDER_SITE_OTHER): Payer: Medicare Other | Admitting: Physician Assistant

## 2020-06-14 ENCOUNTER — Encounter: Payer: Self-pay | Admitting: Physician Assistant

## 2020-06-14 DIAGNOSIS — E039 Hypothyroidism, unspecified: Secondary | ICD-10-CM

## 2020-06-14 DIAGNOSIS — M792 Neuralgia and neuritis, unspecified: Secondary | ICD-10-CM | POA: Diagnosis not present

## 2020-06-14 DIAGNOSIS — M545 Low back pain, unspecified: Secondary | ICD-10-CM | POA: Diagnosis not present

## 2020-06-14 DIAGNOSIS — E782 Mixed hyperlipidemia: Secondary | ICD-10-CM | POA: Diagnosis not present

## 2020-06-14 DIAGNOSIS — G8929 Other chronic pain: Secondary | ICD-10-CM | POA: Diagnosis not present

## 2020-06-14 DIAGNOSIS — I10 Essential (primary) hypertension: Secondary | ICD-10-CM | POA: Diagnosis not present

## 2020-06-14 NOTE — Progress Notes (Signed)
[facility] [place], [place] [address]  Internal MEDICINE  Office Visit Note  Patient Name: Stacey [name]  [id number]  [id number]  Date of Service: 06/15/2020  Chief Complaint  Patient presents with  . Hypertension    4 week fup    HPI Pt is here for f/u -She is exercising now and going for walks. -She has fibroids and her abdomen feels full, but she denies any pain or vaginal bleeding. -BP at home 148/80 sometimes will go up to 494 systolic. Recently 140/80 and once as low as 130/80. Does state it is usually a little high even at home though. She is currently taking 1 tab lasix daily, 1 tab hydralazine TID, 1 tab losartan, coreg BID, and clonidine BID. She is supposed to be taking 2 tabs of hydralazine TID, however she has not been doing this and will start now. Recheck in office 140/80 -She continues to take Gabapentin BID for her neuralgia  Current Medication: Outpatient Encounter Medications as of 06/14/2020  Medication Sig  . albuterol (VENTOLIN HFA) 108 (90 Base) MCG/ACT inhaler Inhale 2 puffs into the lungs every 6 (six) hours as needed for wheezing or shortness of breath.  . carvedilol (COREG) 6.25 MG tablet Take 1 tablet (6.25 mg total) by mouth 2 (two) times daily with a meal.  . cetirizine (ZYRTEC) 10 MG tablet Take 1 tablet (10 mg total) by mouth daily.  . Cholecalciferol (VITAMIN D3) 3000 units TABS Take 3,000 Units by mouth daily.   . cinacalcet (SENSIPAR) 30 MG tablet Take 30 mg by mouth once a week.   . clobetasol ointment (TEMOVATE) 4.96 % Apply 1 application topically 2 (two) times daily as needed (rash).   . cloNIDine (CATAPRES) 0.1 MG tablet Take 1 tablet (0.1 mg total) by mouth 2 (two) times daily.  . fluticasone (FLONASE) 50 MCG/ACT nasal spray Place 2 sprays into both nostrils daily.  . fluticasone (FLOVENT HFA) 110 MCG/ACT inhaler Take 2 puffs at bed time (Patient taking differently: Inhale 2 puffs into the lungs at bedtime as needed  (shortness of breath).)  . furosemide (LASIX) 20 MG tablet Take 2 tablets (40 mg total) by mouth daily.  Marland Kitchen gabapentin (NEURONTIN) 100 MG capsule Take 1 capsule po QPM for one week. May increase to 1 capsule BID as needed and as tolerated.  . hydrALAZINE (APRESOLINE) 50 MG tablet Take 2 tablets (100 mg total) by mouth 3 (three) times daily.  Marland Kitchen latanoprost (XALATAN) 0.005 % ophthalmic solution Place 1 drop into both eyes at bedtime.   Marland Kitchen levothyroxine (SYNTHROID, LEVOTHROID) 88 MCG tablet Take 88 mcg by mouth daily before breakfast.   . losartan (COZAAR) 100 MG tablet Take 1 tablet (100 mg total) by mouth daily.  . montelukast (SINGULAIR) 10 MG tablet Take 1 tablet (10 mg total) by mouth daily.  . Multiple Vitamins-Minerals (MULTIVITAMIN GUMMIES WOMENS) CHEW Chew 2 tablets by mouth daily.  . naproxen (NAPROSYN) 500 MG tablet Take 1 tablet (500 mg total) by mouth 2 (two) times daily with a meal.  . ondansetron (ZOFRAN) 8 MG tablet Take by mouth every 8 (eight) hours as needed for nausea or vomiting.  . pantoprazole (PROTONIX) 40 MG tablet TAKE 1 TABLET BY MOUTH AS NEEDED  . pravastatin (PRAVACHOL) 20 MG tablet TAKE 1 TABLET(20 MG) BY MOUTH EVERY NIGHT  . RESTASIS 0.05 % ophthalmic emulsion Place 1 drop into both eyes 2 (two) times daily.  . timolol (TIMOPTIC) 0.25 % ophthalmic solution Place 1 drop  into both eyes 2 (two) times daily.   Marland Kitchen triamcinolone ointment (KENALOG) 0.1 % Apply 1 application topically 2 (two) times daily.  . colchicine 0.6 MG tablet Take 1 tablet (0.6 mg total) by mouth daily.   No facility-administered encounter medications on file as of 06/14/2020.    Surgical History: Past Surgical History:  Procedure Laterality Date  . EYE SURGERY Right 07/09/2019  . GALLBLADDER SURGERY    . KNEE SURGERY    . thyroidectomy      Medical History: Past Medical History:  Diagnosis Date  . Hypertension   . Thyroid disease     Family History: Family History  Problem Relation Age  of Onset  . Hypertension Mother   . Diabetes Mother   . Anuerysm Mother   . Diabetes Father   . Hypertension Father     Social History   Socioeconomic History  . Marital status: Widowed    Spouse name: Not on file  . Number of children: Not on file  . Years of education: Not on file  . Highest education level: Not on file  Occupational History  . Not on file  Tobacco Use  . Smoking status: Never Smoker  . Smokeless tobacco: Never Used  Vaping Use  . Vaping Use: Never used  Substance and Sexual Activity  . Alcohol use: No  . Drug use: Not Currently  . Sexual activity: Not on file  Other Topics Concern  . Not on file  Social History Narrative  . Not on file   Social Determinants of Health   Financial Resource Strain: Not on file  Food Insecurity: Not on file  Transportation Needs: Not on file  Physical Activity: Not on file  Stress: Not on file  Social Connections: Not on file  Intimate Partner Violence: Not on file      Review of Systems  Constitutional: Negative for chills, fatigue and unexpected weight change.  HENT: Negative for congestion, postnasal drip, rhinorrhea, sneezing and sore throat.   Eyes: Negative for redness.  Respiratory: Negative for cough, chest tightness and shortness of breath.   Cardiovascular: Negative for chest pain and palpitations.  Gastrointestinal: Positive for abdominal distention. Negative for abdominal pain, constipation, diarrhea, nausea and vomiting.       Abdominal bloating secondary to fibroids  Genitourinary: Negative for dysuria, frequency and vaginal bleeding.  Musculoskeletal: Positive for arthralgias and back pain. Negative for joint swelling and neck pain.  Skin: Negative for rash.  Neurological: Negative.  Negative for tremors and numbness.       Neuralgia in LE  Hematological: Negative for adenopathy. Does not bruise/bleed easily.  Psychiatric/Behavioral: Negative for behavioral problems (Depression), sleep  disturbance and suicidal ideas. The patient is not nervous/anxious.     Vital Signs: BP (!) 150/80   Pulse 74   Temp 97.9 F (36.6 C)   Resp 16   Ht 4\' 9"  (1.448 m)   Wt 151 lb (68.5 kg)   SpO2 97%   BMI 32.68 kg/m    Physical Exam Vitals and nursing note reviewed.  Constitutional:      General: She is not in acute distress.    Appearance: She is well-developed. She is obese. She is not diaphoretic.  HENT:     Head: Normocephalic and atraumatic.     Mouth/Throat:     Pharynx: No oropharyngeal exudate.  Eyes:     Pupils: Pupils are equal, round, and reactive to light.  Neck:     Thyroid: No thyromegaly.  Vascular: No JVD.     Trachea: No tracheal deviation.  Cardiovascular:     Rate and Rhythm: Normal rate and regular rhythm.     Heart sounds: Normal heart sounds. No murmur heard. No friction rub. No gallop.   Pulmonary:     Effort: Pulmonary effort is normal. No respiratory distress.     Breath sounds: No wheezing or rales.  Chest:     Chest wall: No tenderness.  Abdominal:     General: Bowel sounds are normal. There is distension.     Palpations: Abdomen is soft. There is no mass.     Tenderness: There is no abdominal tenderness. There is no guarding.     Hernia: No hernia is present.     Comments: Mildl fullness in abdomen, per pt she has known fibroids and this happens from time to time. Denies any TTP  Musculoskeletal:        General: Normal range of motion.     Cervical back: Normal range of motion and neck supple.     Right lower leg: No edema.     Left lower leg: No edema.  Lymphadenopathy:     Cervical: No cervical adenopathy.  Skin:    General: Skin is warm and dry.  Neurological:     Mental Status: She is alert and oriented to person, place, and time.     Cranial Nerves: No cranial nerve deficit.     Sensory: Sensory deficit present.  Psychiatric:        Behavior: Behavior normal.        Thought Content: Thought content normal.         Judgment: Judgment normal.        Assessment/Plan: 1. Essential hypertension BP still elevated at home and in office, will have pt take 2 tabs of hydralazine TID and continue other medications as before. Pt will monitor BP closely and decrease dose back to 1 tab if BP drops too low/she is dizzy or lightheaded.  2. Neuralgia and neuritis Continue gabapentin BID.  3. Mixed hyperlipidemia Continue Pravastatin daily  4. Acquired hypothyroidism Continue synthroid daily  5. Chronic bilateral low back pain without sciatica May take naproxen or tylenol as needed.   General Counseling: amarianna abplanalp understanding of the findings of todays visit and agrees with plan of treatment. I have discussed any further diagnostic evaluation that may be needed or ordered today. We also reviewed her medications today. she has been encouraged to call the office with any questions or concerns that should arise related to todays visit.  Hypertension Counseling:   The following hypertensive lifestyle modification were recommended and discussed:  1. Limiting alcohol intake to less than 1 oz/day of ethanol:(24 oz of beer or 8 oz of wine or 2 oz of 100-proof whiskey). 2. Take baby ASA 81 mg daily. 3. Importance of regular aerobic exercise and losing weight. 4. Reduce dietary saturated fat and cholesterol intake for overall cardiovascular health. 5. Maintaining adequate dietary potassium, calcium, and magnesium intake. 6. Regular monitoring of the blood pressure. 7. Reduce sodium intake to less than 100 mmol/day (less than 2.3 gm of sodium or less than 6 gm of sodium choride)    No orders of the defined types were placed in this encounter.   No orders of the defined types were placed in this encounter.   This patient was seen by Drema Dallas, PA-C in collaboration with Dr. Clayborn Bigness as a part of collaborative care agreement.   Total  time spent:30 Minutes Time spent includes review of chart,  medications, test results, and follow up plan with the patient.      Dr [name] Internal medicine

## 2020-06-25 ENCOUNTER — Emergency Department
Admission: EM | Admit: 2020-06-25 | Discharge: 2020-06-25 | Disposition: A | Discharge status: Home / Self Care | Payer: Medicare Other | Attending: Emergency Medicine | Admitting: Emergency Medicine

## 2020-06-25 ENCOUNTER — Other Ambulatory Visit: Payer: Self-pay

## 2020-06-25 ENCOUNTER — Telehealth: Payer: Self-pay

## 2020-06-25 DIAGNOSIS — Z79899 Other long term (current) drug therapy: Secondary | ICD-10-CM | POA: Diagnosis not present

## 2020-06-25 DIAGNOSIS — N183 Chronic kidney disease, stage 3 unspecified: Secondary | ICD-10-CM | POA: Diagnosis not present

## 2020-06-25 DIAGNOSIS — M5416 Radiculopathy, lumbar region: Secondary | ICD-10-CM

## 2020-06-25 DIAGNOSIS — M545 Low back pain, unspecified: Secondary | ICD-10-CM | POA: Diagnosis present

## 2020-06-25 DIAGNOSIS — I129 Hypertensive chronic kidney disease with stage 1 through stage 4 chronic kidney disease, or unspecified chronic kidney disease: Secondary | ICD-10-CM | POA: Insufficient documentation

## 2020-06-25 DIAGNOSIS — J449 Chronic obstructive pulmonary disease, unspecified: Secondary | ICD-10-CM | POA: Diagnosis not present

## 2020-06-25 DIAGNOSIS — E039 Hypothyroidism, unspecified: Secondary | ICD-10-CM | POA: Insufficient documentation

## 2020-06-25 MED ORDER — ONDANSETRON 4 MG PO TBDP: 4.0000 mg | ORAL_TABLET | Freq: Three times a day (TID) | ORAL | 0 refills | Status: AC | PRN

## 2020-06-25 MED ORDER — CYCLOBENZAPRINE HCL 10 MG PO TABS
5.0000 mg | ORAL_TABLET | Freq: Once | ORAL | Status: AC
  Administered 2020-06-25: 5 mg via ORAL
  Filled 2020-06-25: qty 1

## 2020-06-25 MED ORDER — KETOROLAC TROMETHAMINE 10 MG PO TABS: 10.0000 mg | ORAL_TABLET | Freq: Three times a day (TID) | ORAL | 0 refills | Status: DC

## 2020-06-25 MED ORDER — HYDROCODONE-ACETAMINOPHEN 5-325 MG PO TABS
1.0000 | ORAL_TABLET | Freq: Once | ORAL | Status: AC
Start: 2020-06-25 — End: 2020-06-25
  Administered 2020-06-25: 1 via ORAL
  Filled 2020-06-25: qty 1

## 2020-06-25 MED ORDER — CYCLOBENZAPRINE HCL 10 MG PO TABS: 10.0000 mg | ORAL_TABLET | Freq: Three times a day (TID) | ORAL | 0 refills | Status: DC | PRN

## 2020-06-25 MED ORDER — HYDROCODONE-ACETAMINOPHEN 5-325 MG PO TABS: 1.0000 | ORAL_TABLET | Freq: Three times a day (TID) | ORAL | 0 refills | Status: DC | PRN

## 2020-06-25 MED ORDER — KETOROLAC TROMETHAMINE 30 MG/ML IJ SOLN
30.0000 mg | Freq: Once | INTRAMUSCULAR | Status: AC
  Administered 2020-06-25: 30 mg via INTRAMUSCULAR
  Filled 2020-06-25: qty 1

## 2020-06-25 MED ORDER — ONDANSETRON 4 MG PO TBDP
4.0000 mg | ORAL_TABLET | Freq: Once | ORAL | Status: AC
  Administered 2020-06-25: 4 mg via ORAL
  Filled 2020-06-25: qty 1

## 2020-06-25 NOTE — ED Provider Notes (Signed)
Stacey [name] Emergency Department Provider Note ____________________________________________  Time seen: 1250  I have reviewed the triage vital signs and the nursing notes.  HISTORY  Chief Complaint  Back Pain  HPI Stacey [name] is a 75 y.o. female Stacey [name] her self to the ED for evaluation of acute on chronic back pain that started today.  Patient describes pain in the right lower back with referral down the posterior right leg and thigh.  She denies any preceding injury, trauma, or fall.  She does take gabapentin for neuropathy, and has previously been on naproxen for back pain.  She denies any bladder or bowel incontinence, foot drop, or saddle anesthesia.   Past Medical History:  Diagnosis Date  . Hypertension   . Thyroid disease     Patient Active Problem List   Diagnosis Date Noted  . Neuralgia and neuritis 03/14/2020  . Primary generalized (osteo)arthritis 11/30/2019  . Encounter for screening mammogram for malignant neoplasm of breast 11/30/2019  . Vasomotor rhinitis 06/28/2019  . Atopic dermatitis 06/28/2019  . Localized superficial swelling, mass, or lump 06/28/2019  . Plantar neuroma of left foot 06/28/2019  . Chronic obstructive pulmonary disease ([place]) 11/03/2018  . Right flank pain 11/03/2018  . Dysuria 11/03/2018  . Acute upper respiratory infection 05/06/2018  . Acquired hypothyroidism 03/18/2018  . Stage 3 chronic kidney disease ([place]) 03/18/2018  . ARF (acute renal failure) ([place]) 01/14/2018  . Hypertensive crisis 01/14/2018  . Acute respiratory failure with hypoxia ([place]) 01/13/2018  . Encounter for general adult medical examination with abnormal findings 10/26/2017  . Allergic rhinitis 10/26/2017  . Vitamin D deficiency 10/26/2017  . Gastroesophageal reflux disease without esophagitis 10/26/2017  . Essential hypertension 04/16/2017  . Thyroid disease 04/16/2017  . 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 Dilated  cardiomyopathy ([place]) 06/27/2016  . Bilateral carotid artery stenosis 05/30/2016  . SOBOE (shortness of breath on exertion) 05/30/2016  . Hyperparathyroidism, primary ([place]) 08/25/2015  . Hypercalcemia 08/12/2014  . LVH (left ventricular hypertrophy) due to hypertensive disease, without heart failure 07/09/2014  . Moderate mitral insufficiency 07/09/2014  . Benign essential hypertension 07/06/2014  . 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 Mixed hyperlipidemia 01/08/2014  . Chronic pelvic pain in female 05/31/2011  . Fibroids 05/31/2011  . Sarcoidosis 05/31/2011    Past Surgical History:  Procedure Laterality Date  . EYE SURGERY Right 07/09/2019  . GALLBLADDER SURGERY    . KNEE SURGERY    . thyroidectomy      Prior to Admission medications   Medication Sig Start Date End Date Taking? Authorizing Provider  cyclobenzaprine (FLEXERIL) 10 MG tablet Take 1 tablet (10 mg total) by mouth 3 (three) times daily as needed. 06/25/20  Yes [facility], Stacey Karvonen, Stacey [name]  HYDROcodone-acetaminophen (NORCO) 5-325 MG tablet Take 1 tablet by mouth 3 (three) times daily as needed. 06/25/20  Yes Iraida Cragin, Stacey Karvonen, Stacey [name]  ketorolac (TORADOL) 10 MG tablet Take 1 tablet (10 mg total) by mouth every 8 (eight) hours. 06/25/20  Yes Wilhelm Ganaway, Stacey Karvonen, Stacey [name]  ondansetron (ZOFRAN ODT) 4 MG disintegrating tablet Take 1 tablet (4 mg total) by mouth every 8 (eight) hours as needed. 06/25/20  Yes Sarea Fyfe, Stacey Karvonen, Stacey [name]  albuterol (VENTOLIN HFA) 108 (90 Base) MCG/ACT inhaler Inhale 2 puffs into the lungs every 6 (six) hours as needed for wheezing or shortness of breath. 10/31/18   Ronnell Freshwater, NP  carvedilol (COREG) 6.25 MG tablet Take 1 tablet (6.25 mg total) by mouth 2 (  two) times daily with a meal. 01/16/18   Thurnell Lose, MD  cetirizine (ZYRTEC) 10 MG tablet Take 1 tablet (10 mg total) by mouth daily. 03/05/20   Ronnell Freshwater, NP  Cholecalciferol (VITAMIN D3) 3000 units TABS Take 3,000  Units by mouth daily.  05/21/09   [provider]  cinacalcet (SENSIPAR) 30 MG tablet Take 30 mg by mouth once a week.  03/15/16   [provider]  clobetasol ointment (TEMOVATE) 6.71 % Apply 1 application topically 2 (two) times daily as needed (rash).  02/06/16   [provider]  cloNIDine (CATAPRES) 0.1 MG tablet Take 1 tablet (0.1 mg total) by mouth 2 (two) times daily. 05/09/19   Ronnell Freshwater, NP  colchicine 0.6 MG tablet Take 1 tablet (0.6 mg total) by mouth daily. 01/16/18 01/16/19  Thurnell Lose, MD  fluticasone (FLONASE) 50 MCG/ACT nasal spray Place 2 sprays into both nostrils daily. 03/23/20   Luiz Ochoa, NP  fluticasone (FLOVENT HFA) 110 MCG/ACT inhaler Take 2 puffs at bed time Patient taking differently: Inhale 2 puffs into the lungs at bedtime as needed (shortness of breath). 05/22/17   [name], MD  furosemide (LASIX) 20 MG tablet Take 2 tablets (40 mg total) by mouth daily. 11/25/19   Ronnell Freshwater, NP  gabapentin (NEURONTIN) 100 MG capsule Take 1 capsule po QPM for one week. May increase to 1 capsule BID as needed and as tolerated. 03/23/20   Luiz Ochoa, NP  hydrALAZINE (APRESOLINE) 50 MG tablet Take 2 tablets (100 mg total) by mouth 3 (three) times daily. 01/16/18   Thurnell Lose, MD  latanoprost (XALATAN) 0.005 % ophthalmic solution Place 1 drop into both eyes at bedtime.  01/16/14   [provider]  levothyroxine (SYNTHROID, LEVOTHROID) 88 MCG tablet Take 88 mcg by mouth daily before breakfast.  03/11/17   [provider]  losartan (COZAAR) 100 MG tablet Take 1 tablet (100 mg total) by mouth daily. 04/28/20   Luiz Ochoa, NP  montelukast (SINGULAIR) 10 MG tablet Take 1 tablet (10 mg total) by mouth daily. 04/28/20   Luiz Ochoa, NP  Multiple Vitamins-Minerals (MULTIVITAMIN GUMMIES WOMENS) CHEW Chew 2 tablets by mouth daily.    [provider]  naproxen (NAPROSYN) 500 MG tablet Take 1 tablet (500 mg  total) by mouth 2 (two) times daily with a meal. 11/14/19   Boscia, Greer Ee, NP  ondansetron (ZOFRAN) 8 MG tablet Take by mouth every 8 (eight) hours as needed for nausea or vomiting.    [provider]  pantoprazole (PROTONIX) 40 MG tablet TAKE 1 TABLET BY MOUTH AS NEEDED 04/18/19   Ronnell Freshwater, NP  pravastatin (PRAVACHOL) 20 MG tablet TAKE 1 TABLET(20 MG) BY MOUTH EVERY NIGHT 08/19/19   Boscia, Heather E, NP  RESTASIS 0.05 % ophthalmic emulsion Place 1 drop into both eyes 2 (two) times daily. 11/01/17   [provider]  timolol (TIMOPTIC) 0.25 % ophthalmic solution Place 1 drop into both eyes 2 (two) times daily.  07/30/09   [provider]  triamcinolone ointment (KENALOG) 0.1 % Apply 1 application topically 2 (two) times daily. 11/14/19   Ronnell Freshwater, NP    Allergies Codeine, Propoxyphene, Alendronate, Ciprofloxacin, Lisinopril, Amlodipine, Aspirin, Methotrexate, Tramadol, and Vicodin [hydrocodone-acetaminophen]  Family History  Problem Relation Age of Onset  . Hypertension Mother   . Diabetes Mother   . Anuerysm Mother   . Diabetes Father   . Hypertension  Father     Social History Social History   Tobacco Use  . Smoking status: Never Smoker  . Smokeless tobacco: Never Used  Vaping Use  . Vaping Use: Never used  Substance Use Topics  . Alcohol use: No  . Drug use: Not Currently    Review of Systems  Constitutional: Negative for fever. Cardiovascular: Negative for chest pain. Respiratory: Negative for shortness of breath. Gastrointestinal: Negative for abdominal pain, vomiting and diarrhea. Genitourinary: Negative for dysuria. Musculoskeletal: Positive for back pain with RLE referral. Skin: Negative for rash. Neurological: Negative for headaches, focal weakness or numbness. ____________________________________________  PHYSICAL EXAM:  VITAL SIGNS: ED Triage Vitals  Enc Vitals Group     BP 06/25/20 1228 (!) 154/78     Pulse Rate  06/25/20 1228 64     Resp 06/25/20 1228 18     Temp 06/25/20 1228 97.8 F (36.6 C)     Temp Source 06/25/20 1228 Oral     SpO2 06/25/20 1228 98 %     Weight 06/25/20 1229 150 lb (68 kg)     Height 06/25/20 1229 4\' 9"  (1.448 m)     Head Circumference --      Peak Flow --      Pain Score 06/25/20 1229 10     Pain Loc --      Pain Edu? --      Excl. in [place]? --     Constitutional: Alert and oriented. Well appearing and in no distress. Head: Normocephalic and atraumatic. Eyes: Conjunctivae are normal. Normal extraocular movements Cardiovascular: Normal rate, regular rhythm. Normal distal pulses. Respiratory: Normal respiratory effort. No wheezes/rales/rhonchi. Gastrointestinal: Soft and nontender. No distention. Musculoskeletal: Normal spinal alignment without midline tenderness, spasm, deformity, or step-off.  Patient transitions from sit to stand without assistance.  Mildly tender to palpation along the right lumbar sacral junction.  Some referred pain is noted down the right posterior buttocks.  Nontender with normal range of motion in all extremities.  Neurologic: Cranial nerves II to XII grossly intact.  Normal LE DTRs bilaterally.  Normal toe dorsiflexion foot eversion on exam.  Normal gait without ataxia. Normal speech and language. No gross focal neurologic deficits are appreciated. Skin:  Skin is warm, dry and intact. No rash noted. ____________________________________________   RADIOLOGY  Not indicated ____________________________________________  PROCEDURES  Toradol 30 mg IM Norco 5-325 mg PO Zofran 4 mg ODT Flexeril 5 mg PO  Procedures ____________________________________________  INITIAL IMPRESSION / ASSESSMENT AND PLAN / ED COURSE  Patient ED evaluation of acute on chronic low back pain with right-sided low back pain and right LE referral.  Exam is benign and  reassuring as it shows no red flags.  Patient clinically stable with symptoms consistent with likely  radiculopathy.  Review of her old chart reveals some history of multilevel DDD from an MRI done in 2013.  Patient is currently under the care of her primary provider, and has not been seen recently for any change. She reports improvement of her symptoms following med administration. She will be discharged with Flexeril, Norco, and Toradol. She is referred to Neurology and her PCP.  Return precautions are reviewed.    Stacey [name] was evaluated in Emergency Department on 06/25/2020 for the symptoms described in the history of present illness. She was evaluated in the context of the global COVID-19 pandemic, which necessitated consideration that the patient might be at risk for infection with the SARS-CoV-2 virus that causes COVID-19. Institutional protocols and algorithms  that pertain to the evaluation of patients at risk for COVID-19 are in a state of rapid change based on information released by regulatory bodies including the CDC and federal and state organizations. These policies and algorithms were followed during the patient's care in the ED.  I reviewed the patient's prescription history over the last 12 months in the multi-state controlled substances database(s) that includes [place], [place], [place], [place], [place], [place], [place], [place], [place], [place], [place], [place], [place], and [place].  Results were notable for no current RX.  ____________________________________________  FINAL CLINICAL IMPRESSION(S) / ED DIAGNOSES  Final diagnoses:  Lumbar radiculopathy      Stacey [name], Stacey Karvonen, Stacey [name] 06/25/20 1706    Naaman Plummer, MD 06/27/20 [phone]

## 2020-06-25 NOTE — Discharge Instructions (Signed)
Your exam is consistent with a likely radicular pain or sciatic nerve irritation.  Take prescription medications as provided.  Follow-up with your primary provider or neurology for ongoing symptomatic.  Return to the ED if needed.

## 2020-06-25 NOTE — ED Triage Notes (Signed)
Pt states she has chronic back pain but today her lower back pain started going into her R leg

## 2020-06-25 NOTE — Telephone Encounter (Signed)
Pt LMOM about having pain in her right side lower back.  I opened her chart to call her back to get more information and seen that pt was at ER.  I LMOM for pt to call us back

## 2020-06-29 ENCOUNTER — Telehealth: Payer: Self-pay

## 2020-06-29 ENCOUNTER — Other Ambulatory Visit: Payer: Self-pay

## 2020-06-29 DIAGNOSIS — B028 Zoster with other complications: Secondary | ICD-10-CM

## 2020-06-29 MED ORDER — GABAPENTIN 100 MG PO CAPS: ORAL_CAPSULE | ORAL | 1 refills | Status: DC

## 2020-06-29 MED ORDER — AMOXICILLIN-POT CLAVULANATE 875-125 MG PO TABS: 1.0000 | ORAL_TABLET | Freq: Two times a day (BID) | ORAL | 0 refills | Status: DC

## 2020-06-29 NOTE — Telephone Encounter (Signed)
Pt called that she having sinus infection going on for 7 days and no fever as per dr Humphrey Rolls send Augmentin for 7 days and advised her to take mucinex for cough and if she not feeling better need appt in person

## 2020-07-05 DIAGNOSIS — H35353 Cystoid macular degeneration, bilateral: Secondary | ICD-10-CM | POA: Diagnosis not present

## 2020-07-05 DIAGNOSIS — H04123 Dry eye syndrome of bilateral lacrimal glands: Secondary | ICD-10-CM | POA: Diagnosis not present

## 2020-07-05 DIAGNOSIS — H33313 Horseshoe tear of retina without detachment, bilateral: Secondary | ICD-10-CM | POA: Diagnosis not present

## 2020-07-12 ENCOUNTER — Encounter: Payer: Self-pay | Admitting: Emergency Medicine

## 2020-07-12 ENCOUNTER — Inpatient Hospital Stay
Admission: EM | Admit: 2020-07-12 | Discharge: 2020-07-15 | DRG: 291 | Disposition: A | Discharge status: Home Health | Payer: Medicare Other | Attending: Internal Medicine | Admitting: Internal Medicine

## 2020-07-12 ENCOUNTER — Emergency Department: Payer: Medicare Other

## 2020-07-12 ENCOUNTER — Inpatient Hospital Stay
Admit: 2020-07-12 | Discharge: 2020-07-12 | Disposition: A | Discharge status: Home / Self Care | Payer: Medicare Other | Attending: Internal Medicine | Admitting: Internal Medicine

## 2020-07-12 ENCOUNTER — Other Ambulatory Visit: Payer: Self-pay

## 2020-07-12 DIAGNOSIS — I10 Essential (primary) hypertension: Secondary | ICD-10-CM

## 2020-07-12 DIAGNOSIS — I130 Hypertensive heart and chronic kidney disease with heart failure and stage 1 through stage 4 chronic kidney disease, or unspecified chronic kidney disease: Secondary | ICD-10-CM | POA: Diagnosis not present

## 2020-07-12 DIAGNOSIS — I447 Left bundle-branch block, unspecified: Secondary | ICD-10-CM | POA: Diagnosis present

## 2020-07-12 DIAGNOSIS — J811 Chronic pulmonary edema: Secondary | ICD-10-CM | POA: Diagnosis not present

## 2020-07-12 DIAGNOSIS — J189 Pneumonia, unspecified organism: Secondary | ICD-10-CM | POA: Diagnosis not present

## 2020-07-12 DIAGNOSIS — E039 Hypothyroidism, unspecified: Secondary | ICD-10-CM | POA: Diagnosis present

## 2020-07-12 DIAGNOSIS — E890 Postprocedural hypothyroidism: Secondary | ICD-10-CM | POA: Diagnosis present

## 2020-07-12 DIAGNOSIS — J441 Chronic obstructive pulmonary disease with (acute) exacerbation: Secondary | ICD-10-CM

## 2020-07-12 DIAGNOSIS — Z888 Allergy status to other drugs, medicaments and biological substances status: Secondary | ICD-10-CM

## 2020-07-12 DIAGNOSIS — I509 Heart failure, unspecified: Secondary | ICD-10-CM

## 2020-07-12 DIAGNOSIS — I5023 Acute on chronic systolic (congestive) heart failure: Secondary | ICD-10-CM | POA: Diagnosis not present

## 2020-07-12 DIAGNOSIS — I420 Dilated cardiomyopathy: Secondary | ICD-10-CM | POA: Diagnosis present

## 2020-07-12 DIAGNOSIS — Z881 Allergy status to other antibiotic agents status: Secondary | ICD-10-CM | POA: Diagnosis not present

## 2020-07-12 DIAGNOSIS — R339 Retention of urine, unspecified: Secondary | ICD-10-CM | POA: Diagnosis not present

## 2020-07-12 DIAGNOSIS — Z20822 Contact with and (suspected) exposure to covid-19: Secondary | ICD-10-CM | POA: Diagnosis present

## 2020-07-12 DIAGNOSIS — K219 Gastro-esophageal reflux disease without esophagitis: Secondary | ICD-10-CM | POA: Diagnosis present

## 2020-07-12 DIAGNOSIS — R0902 Hypoxemia: Secondary | ICD-10-CM

## 2020-07-12 DIAGNOSIS — I248 Other forms of acute ischemic heart disease: Secondary | ICD-10-CM | POA: Diagnosis present

## 2020-07-12 DIAGNOSIS — R0602 Shortness of breath: Secondary | ICD-10-CM | POA: Diagnosis not present

## 2020-07-12 DIAGNOSIS — Z79890 Hormone replacement therapy (postmenopausal): Secondary | ICD-10-CM | POA: Diagnosis not present

## 2020-07-12 DIAGNOSIS — N1831 Chronic kidney disease, stage 3a: Secondary | ICD-10-CM

## 2020-07-12 DIAGNOSIS — Z7951 Long term (current) use of inhaled steroids: Secondary | ICD-10-CM

## 2020-07-12 DIAGNOSIS — N179 Acute kidney failure, unspecified: Secondary | ICD-10-CM | POA: Diagnosis present

## 2020-07-12 DIAGNOSIS — D8685 Sarcoid myocarditis: Secondary | ICD-10-CM | POA: Diagnosis present

## 2020-07-12 DIAGNOSIS — R778 Other specified abnormalities of plasma proteins: Secondary | ICD-10-CM | POA: Diagnosis present

## 2020-07-12 DIAGNOSIS — N1832 Chronic kidney disease, stage 3b: Secondary | ICD-10-CM | POA: Diagnosis present

## 2020-07-12 DIAGNOSIS — Z886 Allergy status to analgesic agent status: Secondary | ICD-10-CM | POA: Diagnosis not present

## 2020-07-12 DIAGNOSIS — R7989 Other specified abnormal findings of blood chemistry: Secondary | ICD-10-CM | POA: Diagnosis present

## 2020-07-12 DIAGNOSIS — I5021 Acute systolic (congestive) heart failure: Secondary | ICD-10-CM | POA: Diagnosis not present

## 2020-07-12 DIAGNOSIS — J9601 Acute respiratory failure with hypoxia: Secondary | ICD-10-CM | POA: Diagnosis not present

## 2020-07-12 DIAGNOSIS — Z8249 Family history of ischemic heart disease and other diseases of the circulatory system: Secondary | ICD-10-CM

## 2020-07-12 DIAGNOSIS — J449 Chronic obstructive pulmonary disease, unspecified: Secondary | ICD-10-CM

## 2020-07-12 DIAGNOSIS — I110 Hypertensive heart disease with heart failure: Secondary | ICD-10-CM | POA: Diagnosis not present

## 2020-07-12 DIAGNOSIS — E782 Mixed hyperlipidemia: Secondary | ICD-10-CM | POA: Diagnosis present

## 2020-07-12 DIAGNOSIS — I083 Combined rheumatic disorders of mitral, aortic and tricuspid valves: Secondary | ICD-10-CM | POA: Diagnosis present

## 2020-07-12 DIAGNOSIS — R601 Generalized edema: Secondary | ICD-10-CM

## 2020-07-12 DIAGNOSIS — I6529 Occlusion and stenosis of unspecified carotid artery: Secondary | ICD-10-CM | POA: Diagnosis present

## 2020-07-12 DIAGNOSIS — Z833 Family history of diabetes mellitus: Secondary | ICD-10-CM

## 2020-07-12 DIAGNOSIS — E210 Primary hyperparathyroidism: Secondary | ICD-10-CM | POA: Diagnosis present

## 2020-07-12 DIAGNOSIS — I5043 Acute on chronic combined systolic (congestive) and diastolic (congestive) heart failure: Secondary | ICD-10-CM

## 2020-07-12 DIAGNOSIS — I5033 Acute on chronic diastolic (congestive) heart failure: Secondary | ICD-10-CM | POA: Diagnosis not present

## 2020-07-12 DIAGNOSIS — I161 Hypertensive emergency: Secondary | ICD-10-CM | POA: Diagnosis present

## 2020-07-12 DIAGNOSIS — N183 Chronic kidney disease, stage 3 unspecified: Secondary | ICD-10-CM | POA: Diagnosis present

## 2020-07-12 DIAGNOSIS — Z885 Allergy status to narcotic agent status: Secondary | ICD-10-CM

## 2020-07-12 DIAGNOSIS — Z79899 Other long term (current) drug therapy: Secondary | ICD-10-CM

## 2020-07-12 DIAGNOSIS — B028 Zoster with other complications: Secondary | ICD-10-CM

## 2020-07-12 LAB — ECHOCARDIOGRAM COMPLETE
AR max vel: 1.42 cm2
AV Area VTI: 1.23 cm2
AV Area mean vel: 1.2 cm2
AV Mean grad: 6 mmHg
AV Peak grad: 10.5 mmHg
Ao pk vel: 1.62 m/s
Area-P 1/2: 4.86 cm2
Height: 57 in
MV VTI: 2.03 cm2
P 1/2 time: 582 msec
S' Lateral: 4.2 cm
Weight: 2400.02 oz

## 2020-07-12 LAB — CBC WITH DIFFERENTIAL/PLATELET
Abs Immature Granulocytes: 0.02 10*3/uL (ref 0.00–0.07)
Basophils Absolute: 0 10*3/uL (ref 0.0–0.1)
Basophils Relative: 1 %
Eosinophils Absolute: 0.1 10*3/uL (ref 0.0–0.5)
Eosinophils Relative: 1 %
HCT: 40.4 % (ref 36.0–46.0)
Hemoglobin: 13.1 g/dL (ref 12.0–15.0)
Immature Granulocytes: 0 %
Lymphocytes Relative: 20 %
Lymphs Abs: 1.4 10*3/uL (ref 0.7–4.0)
MCH: 28 pg (ref 26.0–34.0)
MCHC: 32.4 g/dL (ref 30.0–36.0)
MCV: 86.3 fL (ref 80.0–100.0)
Monocytes Absolute: 0.8 10*3/uL (ref 0.1–1.0)
Monocytes Relative: 12 %
Neutro Abs: 4.6 10*3/uL (ref 1.7–7.7)
Neutrophils Relative %: 66 %
Platelets: 241 10*3/uL (ref 150–400)
RBC: 4.68 MIL/uL (ref 3.87–5.11)
RDW: 14.6 % (ref 11.5–15.5)
Smear Review: NORMAL
WBC: 6.9 10*3/uL (ref 4.0–10.5)
nRBC: 0 % (ref 0.0–0.2)

## 2020-07-12 LAB — COMPREHENSIVE METABOLIC PANEL
ALT: 25 U/L (ref 0–44)
AST: 36 U/L (ref 15–41)
Albumin: 3.7 g/dL (ref 3.5–5.0)
Alkaline Phosphatase: 73 U/L (ref 38–126)
Anion gap: 7 (ref 5–15)
BUN: 23 mg/dL (ref 8–23)
CO2: 24 mmol/L (ref 22–32)
Calcium: 10.4 mg/dL — ABNORMAL HIGH (ref 8.9–10.3)
Chloride: 107 mmol/L (ref 98–111)
Creatinine, Ser: 1.43 mg/dL — ABNORMAL HIGH (ref 0.44–1.00)
GFR, Estimated: 39 mL/min — ABNORMAL LOW (ref >60)
Glucose, Bld: 187 mg/dL — ABNORMAL HIGH (ref 70–99)
Potassium: 4.3 mmol/L (ref 3.5–5.1)
Sodium: 138 mmol/L (ref 135–145)
Total Bilirubin: 0.9 mg/dL (ref 0.3–1.2)
Total Protein: 7.9 g/dL (ref 6.5–8.1)

## 2020-07-12 LAB — URINE DRUG SCREEN, QUALITATIVE (ARMC ONLY)
Amphetamines, Ur Screen: NOT DETECTED
Barbiturates, Ur Screen: NOT DETECTED
Benzodiazepine, Ur Scrn: NOT DETECTED
Cannabinoid 50 Ng, Ur ~~LOC~~: NOT DETECTED
Cocaine Metabolite,Ur ~~LOC~~: NOT DETECTED
MDMA (Ecstasy)Ur Screen: NOT DETECTED
Methadone Scn, Ur: NOT DETECTED
Opiate, Ur Screen: NOT DETECTED
Phencyclidine (PCP) Ur S: NOT DETECTED
Tricyclic, Ur Screen: POSITIVE — AB

## 2020-07-12 LAB — RESP PANEL BY RT-PCR (FLU A&B, COVID) ARPGX2
Influenza A by PCR: NEGATIVE
Influenza B by PCR: NEGATIVE
SARS Coronavirus 2 by RT PCR: NEGATIVE

## 2020-07-12 LAB — BRAIN NATRIURETIC PEPTIDE: B Natriuretic Peptide: 1015.6 pg/mL — ABNORMAL HIGH (ref 0.0–100.0)

## 2020-07-12 LAB — TROPONIN I (HIGH SENSITIVITY)
Troponin I (High Sensitivity): 76 ng/L — ABNORMAL HIGH (ref <18)
Troponin I (High Sensitivity): 65 ng/L — ABNORMAL HIGH (ref <18)
Troponin I (High Sensitivity): 97 ng/L — ABNORMAL HIGH (ref <18)
Troponin I (High Sensitivity): 86 ng/L — ABNORMAL HIGH (ref <18)

## 2020-07-12 MED ORDER — FUROSEMIDE 10 MG/ML IJ SOLN
20.0000 mg | Freq: Once | INTRAMUSCULAR | Status: AC
  Administered 2020-07-12: 20 mg via INTRAVENOUS
  Filled 2020-07-12: qty 4

## 2020-07-12 MED ORDER — ONDANSETRON HCL 4 MG/2ML IJ SOLN
4.0000 mg | Freq: Three times a day (TID) | INTRAMUSCULAR | Status: DC | PRN
  Administered 2020-07-13: 4 mg via INTRAVENOUS
  Filled 2020-07-12: qty 2

## 2020-07-12 MED ORDER — NITROGLYCERIN IN D5W 200-5 MCG/ML-% IV SOLN
0.0000 ug/min | INTRAVENOUS | Status: DC
Start: 2020-07-12 — End: 2020-07-13
  Filled 2020-07-12: qty 250

## 2020-07-12 MED ORDER — SODIUM CHLORIDE 0.9% FLUSH: 3.0000 mL | INTRAVENOUS | Status: DC | PRN

## 2020-07-12 MED ORDER — ACETAMINOPHEN 325 MG PO TABS
650.0000 mg | ORAL_TABLET | Freq: Four times a day (QID) | ORAL | Status: DC | PRN
  Administered 2020-07-12 – 2020-07-13 (×2): 650 mg via ORAL
  Filled 2020-07-12 (×2): qty 2

## 2020-07-12 MED ORDER — NITROGLYCERIN IN D5W 200-5 MCG/ML-% IV SOLN
INTRAVENOUS | Status: AC
  Administered 2020-07-12: 50 ug/min via INTRAVENOUS
  Filled 2020-07-12: qty 250

## 2020-07-12 MED ORDER — HYDRALAZINE HCL 20 MG/ML IJ SOLN
5.0000 mg | INTRAMUSCULAR | Status: DC | PRN
  Administered 2020-07-14: 5 mg via INTRAVENOUS
  Filled 2020-07-12: qty 1

## 2020-07-12 MED ORDER — DM-GUAIFENESIN ER 30-600 MG PO TB12: 1.0000 | ORAL_TABLET | Freq: Two times a day (BID) | ORAL | Status: DC | PRN

## 2020-07-12 MED ORDER — IPRATROPIUM-ALBUTEROL 0.5-2.5 (3) MG/3ML IN SOLN
3.0000 mL | Freq: Four times a day (QID) | RESPIRATORY_TRACT | Status: DC
  Administered 2020-07-12 – 2020-07-15 (×11): 3 mL via RESPIRATORY_TRACT
  Filled 2020-07-12 (×12): qty 3

## 2020-07-12 MED ORDER — ENOXAPARIN SODIUM 30 MG/0.3ML ~~LOC~~ SOLN
30.0000 mg | SUBCUTANEOUS | Status: DC
  Administered 2020-07-12 – 2020-07-14 (×3): 30 mg via SUBCUTANEOUS
  Filled 2020-07-12 (×4): qty 0.3

## 2020-07-12 MED ORDER — FUROSEMIDE 10 MG/ML IJ SOLN: 40.0000 mg | Freq: Once | INTRAMUSCULAR | Status: DC

## 2020-07-12 MED ORDER — SODIUM CHLORIDE 0.9% FLUSH
3.0000 mL | Freq: Two times a day (BID) | INTRAVENOUS | Status: DC
  Administered 2020-07-12 – 2020-07-15 (×6): 3 mL via INTRAVENOUS

## 2020-07-12 MED ORDER — HYDRALAZINE HCL 50 MG PO TABS
100.0000 mg | ORAL_TABLET | Freq: Three times a day (TID) | ORAL | Status: DC
Start: 2020-07-12 — End: 2020-07-16
  Administered 2020-07-12 – 2020-07-15 (×11): 100 mg via ORAL
  Filled 2020-07-12 (×11): qty 2

## 2020-07-12 MED ORDER — ALBUTEROL SULFATE HFA 108 (90 BASE) MCG/ACT IN AERS
2.0000 | INHALATION_SPRAY | RESPIRATORY_TRACT | Status: DC | PRN
  Filled 2020-07-12: qty 6.7

## 2020-07-12 MED ORDER — CLONIDINE HCL 0.1 MG PO TABS
0.1000 mg | ORAL_TABLET | Freq: Two times a day (BID) | ORAL | Status: DC
  Administered 2020-07-12 – 2020-07-15 (×7): 0.1 mg via ORAL
  Filled 2020-07-12 (×7): qty 1

## 2020-07-12 MED ORDER — METHYLPREDNISOLONE SODIUM SUCC 125 MG IJ SOLR
125.0000 mg | Freq: Once | INTRAMUSCULAR | Status: AC
  Administered 2020-07-12: 125 mg via INTRAVENOUS
  Filled 2020-07-12: qty 2

## 2020-07-12 MED ORDER — IPRATROPIUM-ALBUTEROL 0.5-2.5 (3) MG/3ML IN SOLN
3.0000 mL | Freq: Once | RESPIRATORY_TRACT | Status: AC
  Administered 2020-07-12: 3 mL via RESPIRATORY_TRACT
  Filled 2020-07-12: qty 3

## 2020-07-12 MED ORDER — FUROSEMIDE 10 MG/ML IJ SOLN
40.0000 mg | Freq: Two times a day (BID) | INTRAMUSCULAR | Status: DC
  Administered 2020-07-12 – 2020-07-13 (×2): 40 mg via INTRAVENOUS
  Filled 2020-07-12 (×2): qty 4

## 2020-07-12 MED ORDER — SODIUM CHLORIDE 0.9 % IV SOLN: 250.0000 mL | INTRAVENOUS | Status: DC | PRN

## 2020-07-12 NOTE — ED Notes (Signed)
RT to pt's bedside, pt transitioned to nasal cannula at 6LPM in order for her to eat. Pt tolerating nasal cannula well, no acute distress observed, O2 sat 97%.

## 2020-07-12 NOTE — H&P (Signed)
History and Physical    Stacey [name] MWU:[id number] DOB: [birth year] DOA: 07/12/2020  Referring MD/NP/PA:   PCP: [name], MD   Patient coming from:  The patient is coming from home.  At baseline, pt is independent for most of ADL.        Chief Complaint: SOB  HPI: Stacey [name] is a 75 y.o. 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  Patient states that her shortness breath started yesterday, which has been progressively worsening.  Patient has dry cough, no chest pain, no fever or chills.  She has nausea, no vomiting, diarrhea or abdominal pain.  No symptoms of UTI.  Patient is in acute respiratory distress, cannot speak in full sentence. Patient was found to have oxygen desaturated to 86% on room air, BiPAP was started in ED. Patient is vaccinated and boosted against COVID-19.  Patient was found to have hypertensive emergency with blood pressure 245/151, nitroglycerin drip is started in ED.  ED Course: pt was found to have COVID PCR, troponin level 76, BNP 1015, worsening renal function, temperature normal, tachycardia with heart rate of 132, 93, tachypnea with RR 36, chest x-ray showed small right middle lobe opacity, mild pulmonary edema.  Pending ABG.  Patient is admitted to progressive bed as inpatient.  Dr. Clayborn Bigness of cardiology is consulted.  Review of Systems:   General: no fevers, chills, no body weight gain, has poor appetite, has fatigue HEENT: no blurry vision, hearing changes or sore throat Respiratory: has dyspnea, coughing, no wheezing CV: no chest pain, no palpitations GI: has nausea, no vomiting, abdominal pain, diarrhea, constipation GU: no dysuria, burning on urination, increased urinary frequency, hematuria  Ext: has mild leg edema Neuro: no unilateral weakness, numbness, or  tingling, no vision change or hearing loss Skin: no rash, no skin tear. MSK: No muscle spasm, no deformity, no limitation of range of movement in spin Heme: No easy bruising.  Travel history: No recent long distant travel.  Allergy:  Allergies  Allergen Reactions  . Codeine Other (See Comments)    Other reaction(s): GI Intolerance Other Reaction: nausea   chest pain Nausea  Other reaction(s): GI Intolerance, Other (See Comments) Nausea GI Intolerance, nausea, chest pain   . Propoxyphene Other (See Comments)    Other Reaction: nausea and chest pain  . Alendronate     Other reaction(s): Other (See Comments) Chest pain  . Ciprofloxacin Rash  . Lisinopril     Other reaction(s): Cough  . Amlodipine Swelling  . Aspirin     Other reaction(s): Other (See Comments) Other reaction(s): Other (See Comments) Stomach hurt  . Methotrexate Hives  . Tramadol Nausea And Vomiting  . Vicodin [Hydrocodone-Acetaminophen] Nausea And Vomiting    Past Medical History:  Diagnosis Date  . Hypertension   . Thyroid disease     Past Surgical History:  Procedure Laterality Date  . EYE SURGERY Right 07/09/2019  . GALLBLADDER SURGERY    . KNEE SURGERY    . thyroidectomy      Social History:  reports that she has never smoked. She has never used smokeless tobacco. She reports previous drug use. She reports that she does not drink alcohol.  Family History:  Family History  Problem Relation Age of Onset  . Hypertension Mother   . Diabetes Mother   . Anuerysm Mother   . Diabetes Father   .  Hypertension Father      Prior to Admission medications   Medication Sig Start Date End Date Taking? Authorizing Provider  albuterol (VENTOLIN HFA) 108 (90 Base) MCG/ACT inhaler Inhale 2 puffs into the lungs every 6 (six) hours as needed for wheezing or shortness of breath. 10/31/18   Ronnell Freshwater, NP  amoxicillin-clavulanate (AUGMENTIN) 875-125 MG tablet Take 1 tablet by mouth 2 (two) times daily.  06/29/20   [name], MD  carvedilol (COREG) 6.25 MG tablet Take 1 tablet (6.25 mg total) by mouth 2 (two) times daily with a meal. 01/16/18   Thurnell Lose, MD  cetirizine (ZYRTEC) 10 MG tablet Take 1 tablet (10 mg total) by mouth daily. 03/05/20   Ronnell Freshwater, NP  Cholecalciferol (VITAMIN D3) 3000 units TABS Take 3,000 Units by mouth daily.  05/21/09   [provider]  cinacalcet (SENSIPAR) 30 MG tablet Take 30 mg by mouth once a week.  03/15/16   [provider]  clobetasol ointment (TEMOVATE) [id number] % Apply 1 application topically 2 (two) times daily as needed (rash).  02/06/16   [provider]  cloNIDine (CATAPRES) 0.1 MG tablet Take 1 tablet (0.1 mg total) by mouth 2 (two) times daily. 05/09/19   Ronnell Freshwater, NP  colchicine 0.6 MG tablet Take 1 tablet (0.6 mg total) by mouth daily. 01/16/18 01/16/19  Thurnell Lose, MD  cyclobenzaprine (FLEXERIL) 10 MG tablet Take 1 tablet (10 mg total) by mouth 3 (three) times daily as needed. 06/25/20   Menshew, Dannielle Karvonen, PA-C  fluticasone (FLONASE) 50 MCG/ACT nasal spray Place 2 sprays into both nostrils daily. 03/23/20   Luiz Ochoa, NP  fluticasone (FLOVENT HFA) 110 MCG/ACT inhaler Take 2 puffs at bed time Patient taking differently: Inhale 2 puffs into the lungs at bedtime as needed (shortness of breath). 05/22/17   [name], MD  furosemide (LASIX) 20 MG tablet Take 2 tablets (40 mg total) by mouth daily. 11/25/19   Ronnell Freshwater, NP  gabapentin (NEURONTIN) 100 MG capsule Take 1 capsule po QPM for one week. May increase to 1 capsule BID as needed and as tolerated. 06/29/20   [name], MD  hydrALAZINE (APRESOLINE) 50 MG tablet Take 2 tablets (100 mg total) by mouth 3 (three) times daily. 01/16/18   Thurnell Lose, MD  HYDROcodone-acetaminophen (NORCO) 5-325 MG tablet Take 1 tablet by mouth 3 (three) times daily as needed. 06/25/20   Menshew, Dannielle Karvonen, PA-C  ketorolac (TORADOL) 10 MG tablet  Take 1 tablet (10 mg total) by mouth every 8 (eight) hours. 06/25/20   Menshew, Dannielle Karvonen, PA-C  latanoprost (XALATAN) 0.005 % ophthalmic solution Place 1 drop into both eyes at bedtime.  01/16/14   [provider]  levothyroxine (SYNTHROID, LEVOTHROID) 88 MCG tablet Take 88 mcg by mouth daily before breakfast.  03/11/17   [provider]  losartan (COZAAR) 100 MG tablet Take 1 tablet (100 mg total) by mouth daily. 04/28/20   Luiz Ochoa, NP  montelukast (SINGULAIR) 10 MG tablet Take 1 tablet (10 mg total) by mouth daily. 04/28/20   Luiz Ochoa, NP  Multiple Vitamins-Minerals (MULTIVITAMIN GUMMIES WOMENS) CHEW Chew 2 tablets by mouth daily.    [provider]  naproxen (NAPROSYN) 500 MG tablet Take 1 tablet (500 mg total) by mouth 2 (two) times daily with a meal. 11/14/19   Boscia, Heather E, NP  ondansetron (ZOFRAN ODT) 4 MG disintegrating tablet Take  1 tablet (4 mg total) by mouth every 8 (eight) hours as needed. 06/25/20   Menshew, Dannielle Karvonen, PA-C  ondansetron (ZOFRAN) 8 MG tablet Take by mouth every 8 (eight) hours as needed for nausea or vomiting.    [provider]  pantoprazole (PROTONIX) 40 MG tablet TAKE 1 TABLET BY MOUTH AS NEEDED 04/18/19   Ronnell Freshwater, NP  pravastatin (PRAVACHOL) 20 MG tablet TAKE 1 TABLET(20 MG) BY MOUTH EVERY NIGHT 08/19/19   Boscia, Heather E, NP  RESTASIS 0.05 % ophthalmic emulsion Place 1 drop into both eyes 2 (two) times daily. 11/01/17   [provider]  timolol (TIMOPTIC) 0.25 % ophthalmic solution Place 1 drop into both eyes 2 (two) times daily.  07/30/09   [provider]  triamcinolone ointment (KENALOG) 0.1 % Apply 1 application topically 2 (two) times daily. 11/14/19   Ronnell Freshwater, NP    Physical Exam: Vitals:   07/12/20 0830 07/12/20 0845 07/12/20 0900 07/12/20 0945  BP: (!) 150/89 (!) 141/87 (!) 150/96 (!) 157/95  Pulse: 79 73 75 68  Resp: (!) 29 (!) 26 20   Temp:      TempSrc:       SpO2: 96% 94% 95% 92%  Weight:      Height:       General: in acute respiratory distress HEENT:       Eyes: PERRL, EOMI, no scleral icterus.       ENT: No discharge from the ears and nose, no pharynx injection, no tonsillar enlargement.        Neck: No JVD, no bruit, no mass felt. Heme: No neck lymph node enlargement. Cardiac: S1/S2, RRR, No murmurs, No gallops or rubs. Respiratory: Has crackles and rhonchi bilaterally GI: Soft, nondistended, nontender, no rebound pain, no organomegaly, BS present. GU: No hematuria Ext: Has trace leg edema bilaterally. 1+DP/PT pulse bilaterally. Musculoskeletal: No joint deformities, No joint redness or warmth, no limitation of ROM in spin. Skin: No rashes.  Neuro: Alert, oriented X3, cranial nerves II-XII grossly intact, moves all extremities normally.  Psych: Patient is not psychotic, no suicidal or hemocidal ideation.  Labs on Admission: I have personally reviewed following labs and imaging studies  CBC: Recent Labs  Lab 07/12/20 0623  WBC 6.9  NEUTROABS 4.6  HGB 13.1  HCT 40.4  MCV 86.3  PLT 500   Basic Metabolic Panel: Recent Labs  Lab 07/12/20 0623  NA 138  K 4.3  CL 107  CO2 24  GLUCOSE 187*  BUN 23  CREATININE 1.43*  CALCIUM 10.4*   GFR: Estimated Creatinine Clearance: 28.3 mL/min (A) (by C-G formula based on SCr of 1.43 mg/dL (H)). Liver Function Tests: Recent Labs  Lab 07/12/20 0623  AST 36  ALT 25  ALKPHOS 73  BILITOT 0.9  PROT 7.9  ALBUMIN 3.7   No results for input(s): LIPASE, AMYLASE in the last 168 hours. No results for input(s): AMMONIA in the last 168 hours. Coagulation Profile: No results for input(s): INR, PROTIME in the last 168 hours. Cardiac Enzymes: No results for input(s): CKTOTAL, CKMB, CKMBINDEX, TROPONINI in the last 168 hours. BNP (last 3 results) No results for input(s): PROBNP in the last 8760 hours. HbA1C: No results for input(s): HGBA1C in the last 72 hours. CBG: No results for  input(s): GLUCAP in the last 168 hours. Lipid Profile: No results for input(s): CHOL, HDL, LDLCALC, TRIG, CHOLHDL, LDLDIRECT in the last 72 hours. Thyroid Function Tests: No results for input(s): TSH,  T4TOTAL, FREET4, T3FREE, THYROIDAB in the last 72 hours. Anemia Panel: No results for input(s): VITAMINB12, FOLATE, FERRITIN, TIBC, IRON, RETICCTPCT in the last 72 hours. Urine analysis:    Component Value Date/Time   APPEARANCEUR Clear 11/14/2019 1515   GLUCOSEU Negative 11/14/2019 1515   BILIRUBINUR Negative 11/14/2019 1515   PROTEINUR 1+ (A) 11/14/2019 1515   NITRITE Negative 11/14/2019 1515   LEUKOCYTESUR Negative 11/14/2019 1515   Sepsis Labs: @LABRCNTIP (procalcitonin:4,lacticidven:4) ) Recent Results (from the past 240 hour(s))  Resp Panel by RT-PCR (Flu A&B, Covid) Nasopharyngeal Swab     Status: None   Collection Time: 07/12/20  6:23 AM   Specimen: Nasopharyngeal Swab; Nasopharyngeal(NP) swabs in vial transport medium  Result Value Ref Range Status   SARS Coronavirus 2 by RT PCR NEGATIVE NEGATIVE Final    Comment: (NOTE) SARS-CoV-2 target nucleic acids are NOT DETECTED.  The SARS-CoV-2 RNA is generally detectable in upper respiratory specimens during the acute phase of infection. The lowest concentration of SARS-CoV-2 viral copies this assay can detect is 138 copies/mL. A negative result does not preclude SARS-Cov-2 infection and should not be used as the sole basis for treatment or other patient management decisions. A negative result may occur with  improper specimen collection/handling, submission of specimen other than nasopharyngeal swab, presence of viral mutation(s) within the areas targeted by this assay, and inadequate number of viral copies(<138 copies/mL). A negative result must be combined with clinical observations, patient history, and epidemiological information. The expected result is Negative.  Fact Sheet for Patients:   EntrepreneurPulse.com.au  Fact Sheet for Healthcare Providers:  IncredibleEmployment.be  This test is no t yet approved or cleared by the [place] FDA and  has been authorized for detection and/or diagnosis of SARS-CoV-2 by FDA under an Emergency Use Authorization (EUA). This EUA will remain  in effect (meaning this test can be used) for the duration of the COVID-19 declaration under Section 564(b)(1) of the Act, 21 U.S.C.section 360bbb-3(b)(1), unless the authorization is terminated  or revoked sooner.       Influenza A by PCR NEGATIVE NEGATIVE Final   Influenza B by PCR NEGATIVE NEGATIVE Final    Comment: (NOTE) The Xpert Xpress SARS-CoV-2/FLU/RSV plus assay is intended as an aid in the diagnosis of influenza from Nasopharyngeal swab specimens and should not be used as a sole basis for treatment. Nasal washings and aspirates are unacceptable for Xpert Xpress SARS-CoV-2/FLU/RSV testing.  Fact Sheet for Patients: EntrepreneurPulse.com.au  Fact Sheet for Healthcare Providers: IncredibleEmployment.be  This test is not yet approved or cleared by the [place] FDA and has been authorized for detection and/or diagnosis of SARS-CoV-2 by FDA under an Emergency Use Authorization (EUA). This EUA will remain in effect (meaning this test can be used) for the duration of the COVID-19 declaration under Section 564(b)(1) of the Act, 21 U.S.C. section 360bbb-3(b)(1), unless the authorization is terminated or revoked.  Performed at [facility], [address] [place], [place] [address]      Radiological Exams on Admission: DG Chest [facility] 1 View  Result Date: 07/12/2020 CLINICAL DATA:  Shortness of breath.  History of COPD. EXAM: PORTABLE CHEST 1 VIEW COMPARISON:  Chest x-ray 01/12/2018, CT chest 04/26/2017, CT chest 10/04/2012, CT chest 04/26/2017 FINDINGS: Redemonstration of a enlarged cardiac  silhouette. The heart size and mediastinal contours are unchanged. Aortic arch calcifications. Right middle lobe airspace opacity. Question retrocardiac opacity. Increased interstitial markings. No pleural effusion. No pneumothorax. No acute osseous abnormality. IMPRESSION: 1. Right middle lobe airspace  opacities suggestive of infection or inflammation. Also question retrocardiac opacity. Followup PA and lateral chest X-ray is recommended in 3-4 weeks following therapy to ensure resolution and exclude underlying malignancy. 2. Mild pulmonary edema. 3.  Aortic Atherosclerosis (ICD10-I70.0). Electronically Signed   By: Iven Finn M.D.   On: 07/12/2020 06:55     EKG: I have personally reviewed.  Sinus rhythm, QTC 437, LAD, LAE, poor R wave progression, T wave inversion in lateral leads  Assessment/Plan Principal Problem:   Acute on chronic diastolic CHF (congestive heart failure) (HCC) Active Problems:   Essential hypertension   Dilated cardiomyopathy (HCC)   Hypercalcemia   Hyperparathyroidism, primary ([place])   Mixed hyperlipidemia   Gastroesophageal reflux disease without esophagitis   Acute respiratory failure with hypoxia (HCC)   CKD (chronic kidney disease), stage IIIa   Chronic obstructive pulmonary disease ([place])   Hypertensive emergency   Elevated troponin   Hypothyroidism   Acute respiratory failure with hypoxia due to acute on chronic diastolic CHF and hypertensive emergency and hx of dilated cardiomyopathy: 2D echo on 01/14/2018 showed EF of 50 to 55% with grade 1 diastolic dysfunction.  Patient has a significant elevated BNP 1015, and mild pulm edema chest x-ray, consistent with CHF exacerbation.  Patient also has hypertensive emergency with blood pressure up to 245/151.  Consulted Dr. Clayborn Bigness of cardiology.  -Will admit to progressive unit as inpatient -Continue BiPAP -on nitroglycerin drip -Lasix 40 mg bid by IV -2d echo -Daily weights -strict I/O's -Low salt  diet -Fluid restriction -Obtain REDs Vest reading  Essential hypertension and hypertensive urgency: -Continue home medications: Coreg, clonidine, hydralazine, cozarr -On nitroglycerin drip, wiill target SBP 160-180 today.  Elevated troponin: Troponin level 76, 65, likely due to demand ischemia -Trend troponin -Check A1c, FLP -Continue pravastatin -Patient is allergic to aspirin  Hypercalcemia and hx of hyperparathyroidism, primary ([place]): Calcium 10.4, mildly elevation -Follow-up with BMP  Mixed hyperlipidemia -Pravastatin  Gastroesophageal reflux disease without esophagitis -Protonix  CKD (chronic kidney disease), stage IIIa: Slightly worsening.  Baseline creatinine 1.2 on 03/30/2020.  Her creatinine is 1.43, BUN 23, likely due to hypertensive emergency. -Monitor renal function by BMP -Hold ketorolac, naproxen  Chronic obstructive pulmonary disease ([place]): No wheezing on auscultation. -Bronchodilators and Singulair -Patient received 1 dose of Solu-Medrol 105 mg in ED  Hypothyroidism -Synthroid         DVT ppx: SQ Lovenox Code Status: Full code Family Communication:  Yes, patient's husband   at bed side Disposition Plan:  Anticipate discharge back to previous environment Consults called: Dr. Clayborn Bigness with of cardiology Admission status and Level of care: Progressive Cardiac:   as inpt       Status is: Inpatient  Remains inpatient appropriate because:Inpatient level of care appropriate due to severity of illness   Dispo: The patient is from: Home              Anticipated d/c is to: Home              Patient currently is not medically stable to d/c.   Difficult to place patient No          Date of Service 07/12/2020    [place]   If 7PM-7AM, please contact night-coverage [email or web address] 07/12/2020, 10:17 AM

## 2020-07-12 NOTE — Progress Notes (Signed)
OT Cancellation Note  Patient Details Name: Stacey [name] MRN: [id number] DOB: 01-04-1946   Cancelled Treatment:    Reason Eval/Treat Not Completed: Medical issues which prohibited therapy. Consult received, chart reviewed. Per RN, pt is on continuous bipap & not appropriate for therapy intervention at this time. Will f/u as able and as pt is medically appropriate.   Hanley Hays, MPH, MS, OTR/L ascom [phone] 07/12/20, 10:35 AM

## 2020-07-12 NOTE — ED Notes (Signed)
Echo tech at bedside.

## 2020-07-12 NOTE — Progress Notes (Signed)
PT Cancellation Note  Patient Details Name: Stacey [name] MRN: [id number] DOB: 19-Jun-1945   Cancelled Treatment:    Reason Eval/Treat Not Completed: Patient not medically ready PT orders received, chart reviewed. Spoke with nurse who reports pt is on continuous bipap & not appropriate for PT intervention at this time. Will f/u as able and as pt is medically appropriate.   Lavone Nian, PT, DPT 07/12/20, 8:43 AM    Waunita Schooner 07/12/2020, 8:42 AM

## 2020-07-12 NOTE — ED Notes (Signed)
Dr Tamala Julian made aware of elevated bp  (sbp >200 and dbp >100 -- see flowsheet) -- Dr Tamala Julian to bedside- verbal order for Nitro drip to start @50mcg /min - see MAR for med administration

## 2020-07-12 NOTE — Consult Note (Signed)
[facility] Consultation Note  Patient ID: Stacey [name], MRN: [id number], DOB/AGE: [birth year] 75 y.o. Admit date: 07/12/2020   Date of Consult: 07/12/2020 Primary Physician: [name], MD Primary Cardiologist: [name]  Chief Complaint:  Chief Complaint  Patient presents with  . Shortness of Breath   Reason for Consult: Heart failure  HPI: 75 y.o. female with known systolic dysfunction congestive heart failure with a previous appropriate medication management who has had significant progression of severe shortness of breath weakness and fatigue and lower extremity edema with PND and orthopnea.  Patient came in respiratory distress and had a chest x-ray consistent with pulmonary edema.  BNP is 1015.  Troponin is 76/65/97 without evidence of myocardial infarction and/or acute coronary syndrome.  EKG shows sinus tachycardia left atrial enlargement and left bundle branch block.  The patient has given intravenous Lasix and overall is improved.  She is hemodynamically stable  Past Medical History:  Diagnosis Date  . Hypertension   . Thyroid disease       Surgical History:  Past Surgical History:  Procedure Laterality Date  . EYE SURGERY Right 07/09/2019  . GALLBLADDER SURGERY    . KNEE SURGERY    . thyroidectomy       Home Meds: Prior to Admission medications   Medication Sig Start Date End Date Taking? Authorizing Provider  albuterol (VENTOLIN HFA) 108 (90 Base) MCG/ACT inhaler Inhale 2 puffs into the lungs every 6 (six) hours as needed for wheezing or shortness of breath. 10/31/18  Yes Boscia, Greer Ee, NP  carvedilol (COREG) 6.25 MG tablet Take 1 tablet (6.25 mg total) by mouth 2 (two) times daily with a meal. Patient taking differently: Take 3.125 mg by mouth 2 (two) times daily with a meal. 01/16/18  Yes Thurnell Lose, MD  cetirizine (ZYRTEC) 10 MG tablet Take 1 tablet (10 mg total) by mouth daily. 03/05/20  Yes Boscia, Greer Ee, NP  clobetasol ointment  (TEMOVATE) 2.70 % Apply 1 application topically 2 (two) times daily as needed (rash).  02/06/16  Yes [provider]  cloNIDine (CATAPRES) 0.1 MG tablet Take 1 tablet (0.1 mg total) by mouth 2 (two) times daily. 05/09/19  Yes Boscia, Greer Ee, NP  cyclobenzaprine (FLEXERIL) 10 MG tablet Take 1 tablet (10 mg total) by mouth 3 (three) times daily as needed. 06/25/20  Yes Menshew, Dannielle Karvonen, PA-C  fluticasone (FLONASE) 50 MCG/ACT nasal spray Place 2 sprays into both nostrils daily. 03/23/20  Yes Luiz Ochoa, NP  fluticasone (FLOVENT HFA) 110 MCG/ACT inhaler Take 2 puffs at bed time Patient taking differently: Inhale 1 puff into the lungs 2 (two) times daily. 05/22/17  Yes [name], MD  furosemide (LASIX) 20 MG tablet Take 2 tablets (40 mg total) by mouth daily. Patient taking differently: Take 20 mg by mouth daily. Can take another pill if needed 11/25/19  Yes Boscia, Heather E, NP  gabapentin (NEURONTIN) 100 MG capsule Take 1 capsule po QPM for one week. May increase to 1 capsule BID as needed and as tolerated. Patient taking differently: Take 200 mg by mouth at bedtime. 06/29/20  Yes [name], MD  hydrALAZINE (APRESOLINE) 50 MG tablet Take 2 tablets (100 mg total) by mouth 3 (three) times daily. 01/16/18  Yes Thurnell Lose, MD  HYDROcodone-acetaminophen (NORCO) 5-325 MG tablet Take 1 tablet by mouth 3 (three) times daily as needed. 06/25/20  Yes Menshew, Dannielle Karvonen, PA-C  ketorolac (TORADOL) 10 MG tablet Take  1 tablet (10 mg total) by mouth every 8 (eight) hours. Patient taking differently: Take 10 mg by mouth every 8 (eight) hours as needed. 06/25/20  Yes Menshew, Dannielle Karvonen, PA-C  latanoprost (XALATAN) 0.005 % ophthalmic solution Place 1 drop into both eyes at bedtime.  01/16/14  Yes [provider]  levothyroxine (SYNTHROID, LEVOTHROID) 88 MCG tablet Take 88 mcg by mouth daily before breakfast.  03/11/17  Yes [provider]  losartan (COZAAR) 100 MG  tablet Take 1 tablet (100 mg total) by mouth daily. 04/28/20  Yes Luiz Ochoa, NP  montelukast (SINGULAIR) 10 MG tablet Take 1 tablet (10 mg total) by mouth daily. 04/28/20  Yes Luiz Ochoa, NP  Multiple Vitamins-Minerals (MULTIVITAMIN GUMMIES WOMENS) CHEW Chew 2 tablets by mouth daily.   Yes [provider]  naproxen (NAPROSYN) 500 MG tablet Take 1 tablet (500 mg total) by mouth 2 (two) times daily with a meal. Patient taking differently: Take 500 mg by mouth 2 (two) times daily as needed. 11/14/19  Yes Boscia, Heather E, NP  ondansetron (ZOFRAN ODT) 4 MG disintegrating tablet Take 1 tablet (4 mg total) by mouth every 8 (eight) hours as needed. 06/25/20  Yes Menshew, Dannielle Karvonen, PA-C  pantoprazole (PROTONIX) 40 MG tablet TAKE 1 TABLET BY MOUTH AS NEEDED 04/18/19  Yes Boscia, Heather E, NP  pravastatin (PRAVACHOL) 20 MG tablet TAKE 1 TABLET(20 MG) BY MOUTH EVERY NIGHT Patient taking differently: Take 40 mg by mouth at bedtime. 08/19/19  Yes Boscia, Heather E, NP  RESTASIS 0.05 % ophthalmic emulsion Place 1 drop into both eyes daily. 11/01/17  Yes [provider]  timolol (TIMOPTIC) 0.25 % ophthalmic solution Place 1 drop into both eyes 2 (two) times daily.  07/30/09  Yes [provider]  colchicine 0.6 MG tablet Take 1 tablet (0.6 mg total) by mouth daily. Patient not taking: Reported on 07/12/2020 01/16/18 01/16/19  Thurnell Lose, MD    Inpatient Medications:  . cloNIDine  0.1 mg Oral BID  . enoxaparin (LOVENOX) injection  30 mg Subcutaneous Q24H  . furosemide  40 mg Intravenous Q12H  . hydrALAZINE  100 mg Oral TID  . ipratropium-albuterol  3 mL Nebulization Q6H  . sodium chloride flush  3 mL Intravenous Q12H   . sodium chloride    . nitroGLYCERIN 20 mcg/min (07/12/20 1234)    Allergies:  Allergies  Allergen Reactions  . Codeine Other (See Comments)    Other reaction(s): GI Intolerance Other Reaction: nausea   chest pain Nausea  Other reaction(s):  GI Intolerance, Other (See Comments) Nausea GI Intolerance, nausea, chest pain   . Propoxyphene Other (See Comments)    Other Reaction: nausea and chest pain  . Alendronate     Other reaction(s): Other (See Comments) Chest pain  . Ciprofloxacin Rash  . Lisinopril     Other reaction(s): Cough  . Amlodipine Swelling  . Aspirin     Other reaction(s): Other (See Comments) Other reaction(s): Other (See Comments) Stomach hurt  . Methotrexate Hives  . Tramadol Nausea And Vomiting  . Vicodin [Hydrocodone-Acetaminophen] Nausea And Vomiting    Social History   Socioeconomic History  . Marital status: Married    Spouse name: Not on file  . Number of children: Not on file  . Years of education: Not on file  . Highest education level: Not on file  Occupational History  . Not on file  Tobacco Use  . Smoking status: Never Smoker  . Smokeless  tobacco: Never Used  Vaping Use  . Vaping Use: Never used  Substance and Sexual Activity  . Alcohol use: No  . Drug use: Not Currently  . Sexual activity: Not on file  Other Topics Concern  . Not on file  Social History Narrative  . Not on file   Social Determinants of Health   Financial Resource Strain: Not on file  Food Insecurity: Not on file  Transportation Needs: Not on file  Physical Activity: Not on file  Stress: Not on file  Social Connections: Not on file  Intimate Partner Violence: Not on file     Family History  Problem Relation Age of Onset  . Hypertension Mother   . Diabetes Mother   . Anuerysm Mother   . Diabetes Father   . Hypertension Father      Review of Systems Positive for shortness of breath PND orthopnea Negative for: General:  chills, fever, night sweats or weight changes.  Cardiovascular: Positive for PND negative for orthopnea syncope dizziness  Dermatological skin lesions rashes Respiratory: Cough congestion Urologic: Frequent urination urination at night and hematuria Abdominal: negative for  nausea, vomiting, diarrhea, bright red blood per rectum, melena, or hematemesis Neurologic: negative for visual changes, and/or hearing changes  All other systems reviewed and are otherwise negative except as noted above.  Labs: No results for input(s): CKTOTAL, CKMB, TROPONINI in the last 72 hours. Lab Results  Component Value Date   WBC 6.9 07/12/2020   HGB 13.1 07/12/2020   HCT 40.4 07/12/2020   MCV 86.3 07/12/2020   PLT 241 07/12/2020    Recent Labs  Lab 07/12/20 0623  NA 138  K 4.3  CL 107  CO2 24  BUN 23  CREATININE 1.43*  CALCIUM 10.4*  PROT 7.9  BILITOT 0.9  ALKPHOS 73  ALT 25  AST 36  GLUCOSE 187*   Lab Results  Component Value Date   CHOL 179 12/26/2019   HDL 49 12/26/2019   LDLCALC 112 (H) 12/26/2019   TRIG 97 12/26/2019   No results found for: DDIMER  Radiology/Studies:  [facility] Chest Port 1 View  Result Date: 07/12/2020 CLINICAL DATA:  Shortness of breath.  History of COPD. EXAM: PORTABLE CHEST 1 VIEW COMPARISON:  Chest x-ray 01/12/2018, CT chest 04/26/2017, CT chest 10/04/2012, CT chest 04/26/2017 FINDINGS: Redemonstration of a enlarged cardiac silhouette. The heart size and mediastinal contours are unchanged. Aortic arch calcifications. Right middle lobe airspace opacity. Question retrocardiac opacity. Increased interstitial markings. No pleural effusion. No pneumothorax. No acute osseous abnormality. IMPRESSION: 1. Right middle lobe airspace opacities suggestive of infection or inflammation. Also question retrocardiac opacity. Followup PA and lateral chest X-ray is recommended in 3-4 weeks following therapy to ensure resolution and exclude underlying malignancy. 2. Mild pulmonary edema. 3.  Aortic Atherosclerosis (ICD10-I70.0). Electronically Signed   By: Iven Finn M.D.   On: 07/12/2020 06:55   ECHOCARDIOGRAM COMPLETE  Result Date: 07/12/2020    ECHOCARDIOGRAM REPORT   Patient Name:   Stacey [name] Date of Exam: 07/12/2020 Medical Rec #:  [id number]       Height:       57.0 in Accession #:    [id number]     Weight:       150.0 lb Date of Birth:  [birth year]     BSA:          1.592 m Patient Age:    77 years       BP:  169/107 mmHg Patient Gender: F              HR:           77 bpm. Exam Location:  [facility] Procedure: 2D Echo, Color Doppler, Cardiac Doppler and Strain Analysis Indications:     I50.31 CHF-Acute Diastolic  History:         Patient has prior history of Echocardiogram examinations. Risk                  Factors:Hypertension.  Sonographer:     Charmayne Sheer RDCS (AE) Referring Phys:  [id number] Soledad Gerlach NIU Diagnosing Phys: Serafina Royals MD  Sonographer Comments: Global longitudinal strain was attempted. IMPRESSIONS  1. Left ventricular ejection fraction, by estimation, is 20 to 25%. The left ventricle has severely decreased function. The left ventricle demonstrates global hypokinesis. The left ventricular internal cavity size was mildly dilated. Left ventricular diastolic parameters are consistent with Grade II diastolic dysfunction (pseudonormalization).  2. Right ventricular systolic function is normal. The right ventricular size is normal.  3. The mitral valve is normal in structure. Mild mitral valve regurgitation.  4. The aortic valve is calcified. Aortic valve regurgitation is mild. Mild aortic valve stenosis. FINDINGS  Left Ventricle: Left ventricular ejection fraction, by estimation, is 20 to 25%. The left ventricle has severely decreased function. The left ventricle demonstrates global hypokinesis. The left ventricular internal cavity size was mildly dilated. There is no left ventricular hypertrophy. Left ventricular diastolic parameters are consistent with Grade II diastolic dysfunction (pseudonormalization). Right Ventricle: The right ventricular size is normal. No increase in right ventricular wall thickness. Right ventricular systolic function is normal. Left Atrium: Left atrial size was normal in size. Right Atrium: Right atrial size was normal  in size. Pericardium: There is no evidence of pericardial effusion. Mitral Valve: The mitral valve is normal in structure. Mild mitral valve regurgitation. MV peak gradient, 6.0 mmHg. The mean mitral valve gradient is 2.0 mmHg. Tricuspid Valve: The tricuspid valve is normal in structure. Tricuspid valve regurgitation is mild. Aortic Valve: The aortic valve is calcified. Aortic valve regurgitation is mild. Aortic regurgitation PHT measures 582 msec. Mild aortic stenosis is present. Aortic valve mean gradient measures 6.0 mmHg. Aortic valve peak gradient measures 10.5 mmHg. Aortic valve area, by VTI measures 1.23 cm. Pulmonic Valve: The pulmonic valve was normal in structure. Pulmonic valve regurgitation is not visualized. Aorta: The aortic root and ascending aorta are structurally normal, with no evidence of dilitation. IAS/Shunts: No atrial level shunt detected by color flow Doppler.  LEFT VENTRICLE PLAX 2D LVIDd:         5.20 cm  Diastology LVIDs:         4.20 cm  LV e' medial:    3.26 cm/s LV PW:         1.00 cm  LV E/e' medial:  24.0 LV IVS:        0.80 cm  LV e' lateral:   2.61 cm/s LVOT diam:     1.70 cm  LV E/e' lateral: 30.0 LV SV:         35 LV SV Index:   22 LVOT Area:     2.27 cm  RIGHT VENTRICLE RV Basal diam:  3.10 cm LEFT ATRIUM             Index       RIGHT ATRIUM          Index LA diam:        3.70  cm 2.32 cm/m  RA Area:     8.70 cm LA Vol (A2C):   30.0 ml 18.85 ml/m RA Volume:   13.00 ml 8.17 ml/m LA Vol (A4C):   48.5 ml 30.47 ml/m LA Biplane Vol: 39.6 ml 24.88 ml/m  AORTIC VALVE                    PULMONIC VALVE AV Area (Vmax):    1.42 cm     PV Vmax:       0.84 m/s AV Area (Vmean):   1.20 cm     PV Vmean:      50.100 cm/s AV Area (VTI):     1.23 cm     PV VTI:        0.128 m AV Vmax:           162.00 cm/s  PV Peak grad:  2.9 mmHg AV Vmean:          116.000 cm/s PV Mean grad:  1.0 mmHg AV VTI:            0.287 m AV Peak Grad:      10.5 mmHg AV Mean Grad:      6.0 mmHg LVOT Vmax:          101.00 cm/s LVOT Vmean:        61.400 cm/s LVOT VTI:          0.155 m LVOT/AV VTI ratio: 0.54 AI PHT:            582 msec  AORTA Ao Root diam: 2.70 cm MITRAL VALVE MV Area (PHT): 4.86 cm     SHUNTS MV Area VTI:   2.03 cm     Systemic VTI:  0.16 m MV Peak grad:  6.0 mmHg     Systemic Diam: 1.70 cm MV Mean grad:  2.0 mmHg MV Vmax:       1.22 m/s MV Vmean:      62.6 cm/s MV Decel Time: 156 msec MV E velocity: 78.40 cm/s MV A velocity: 111.00 cm/s MV E/A ratio:  0.71 Serafina Royals MD Electronically signed by Serafina Royals MD Signature Date/Time: 07/12/2020/5:06:07 PM    Final     EKG: Normal sinus rhythm with left atrial enlargement left bundle branch block  Weights: Filed Weights   07/12/20 0608  Weight: 68 kg     Physical Exam: Blood pressure (!) 167/93, pulse 75, temperature 97.7 F (36.5 C), temperature source Oral, resp. rate (!) 27, height 4\' 9"  (1.448 m), weight 68 kg, SpO2 93 %. Body mass index is 32.46 kg/m. General: Well developed, well nourished, in no acute distress. Head eyes ears nose throat: Normocephalic, atraumatic, sclera non-icteric, no xanthomas, nares are without discharge. No apparent thyromegaly and/or mass  Lungs: Normal respiratory effort.  no wheezes, basilar rales, no rhonchi.  Heart: RRR with normal S1 S2. no murmur gallop, no rub, PMI is normal size and placement, carotid upstroke normal without bruit, jugular venous pressure is normal Abdomen: Soft, non-tender, non-distended with normoactive bowel sounds. No hepatomegaly. No rebound/guarding. No obvious abdominal masses. Abdominal aorta is normal size without bruit Extremities: Trace edema. no cyanosis, no clubbing, no ulcers  Peripheral : 2+ bilateral upper extremity pulses, 2+ bilateral femoral pulses, 2+ bilateral dorsal pedal pulse Neuro: Alert and oriented. No facial asymmetry. No focal deficit. Moves all extremities spontaneously. Musculoskeletal: Normal muscle tone without kyphosis Psych:  Responds to  questions appropriately with a normal affect.    Assessment: 75 year old  female with acute on chronic systolic dysfunction congestive heart failure with elevated troponin consistent with demand ischemia without evidence of myocardial infarction and left bundle branch block improved now with oxygen supplementation and diuretics without  Plan: 1.  Continue BiPAP machine to improve oxygenation 2.  No further cardiac diagnostics necessary at this time due to no evidence of myocardial infarction 3.  Continue Lasix intravenously for acute on chronic systolic dysfunction congestive heart failure 4.  Continue antihypertensive medication management for further risk reduction cardiovascular event and better blood pressure control to improve above malignant hypertension 5.  Further treatment options after above  Signed, Corey Skains M.D. [facility] 07/12/2020, 5:27 PM

## 2020-07-12 NOTE — ED Notes (Signed)
Pt resting comfortably, family at bedside. Nitro drip titrated to lower dose per discussion with admitting MD, see mar. See charted vitals. Patient denies pain or acute needs at this time. Bed locked in low position with side rails up x2, call light in reach.

## 2020-07-12 NOTE — Progress Notes (Signed)
*  PRELIMINARY RESULTS* Echocardiogram 2D Echocardiogram has been performed.  Stacey [name] 07/12/2020, 12:46 PM

## 2020-07-12 NOTE — ED Notes (Addendum)
RN at bedside, meds titrated per verbal order from Dr Tamala Julian who evaluated pt at bedside. Pt alert and oriented x4, resting quietly with family at bedside. Bed locked in low position, side rails up x2, call light in reach. See charted vitals.

## 2020-07-12 NOTE — ED Provider Notes (Addendum)
  Clinical Course as of 07/12/20 0759  Mon Jul 12, 2020  0619 Chest x-ray appears more pulmonary edema.  Patient tells me she takes Lasix 20 mg daily.  Breathing is more labored.  Will trial BiPAP. [JS]  0701 Patient looking more comfortable on BiPAP.  Care transferred to Dr. Tamala Julian at change of shift.  Anticipate hospitalization. [JS]  [id number] Patient received in signout from Dr. Beather Arbour.  I go re-eval the patient. Reportedly more comfortable on BiPAP.  Still diaphoretic, hypertensive and tachypneic.  We will start nitroglycerin drip. [DS]  0109 reassessed [DS]  3235 Reassessed.  Titrated nitroglycerin drip up to 75 mcg/min.  Clinically she has improved.  Respiratory rate is slowing, heart rate is slowing and blood pressure is improving with systolics less than 573. [DS]  2202 I discussed the case with Dr. Blaine Hamper, who agrees to admit [DS]    Clinical Course User Index [DS] Vladimir Crofts, MD [JS] Paulette Blanch, MD   .Critical Care Performed by: Vladimir Crofts, MD Authorized by: Vladimir Crofts, MD   Critical care provider statement:    Critical care time (minutes):  30   Critical care was necessary to treat or prevent imminent or life-threatening deterioration of the following conditions:  Respiratory failure   Critical care was time spent personally by me on the following activities:  Discussions with consultants, evaluation of patient's response to treatment, examination of patient, ordering and performing treatments and interventions, ordering and review of laboratory studies, ordering and review of radiographic studies, pulse oximetry, re-evaluation of patient's condition, obtaining history from patient or surrogate and review of old charts .1-3 Lead EKG Interpretation Performed by: Vladimir Crofts, MD Authorized by: Vladimir Crofts, MD     Interpretation: abnormal     ECG rate:  124   ECG rate assessment: tachycardic     Rhythm: sinus tachycardia     Ectopy: none     Conduction: normal   Comments:      Improving with ntg gtt      Vladimir Crofts, MD 07/12/20 [place], [place], MD 07/12/20 [phone]

## 2020-07-12 NOTE — Progress Notes (Signed)
PHARMACIST - PHYSICIAN COMMUNICATION  CONCERNING:  Enoxaparin (Lovenox) for DVT Prophylaxis   DESCRIPTION: Patient was prescribed enoxaprin 40mg  q24 hours for VTE prophylaxis.   Filed Weights   07/12/20 0608  Weight: 68 kg (150 lb)    Body mass index is 32.46 kg/m.  Estimated Creatinine Clearance: 28.3 mL/min (A) (by C-G formula based on SCr of 1.43 mg/dL (H)).  Patient is candidate for enoxaparin 30mg  every 24 hours based on CrCl <21ml/min or Weight <45kg  RECOMMENDATION: Pharmacy has adjusted enoxaparin dose per [facility] policy.  Patient is now receiving enoxaparin 30 mg every 24 hours    Darnelle Bos, PharmD Clinical Pharmacist  07/12/2020 8:12 AM

## 2020-07-12 NOTE — ED Notes (Signed)
Continuous cardiac and pulse ox monitoring. Received bedside report from [place], [place]. Pt's family present at bedside. Pt and family educated on plan of care with RN x2 and MD at bedside, see mar for meds administered. Purewick in place for urinary management. Bed locked in low position with side rails up x2, call light in reach.

## 2020-07-12 NOTE — ED Notes (Signed)
Attending MD, Ivor Costa, notified of critical lab value, troponin 97. No new orders received at this time.

## 2020-07-12 NOTE — ED Notes (Signed)
Informed RN bed assigned  1606 

## 2020-07-12 NOTE — ED Triage Notes (Signed)
PT arrived via POV with c/o shortness of breath, pt has hx of COPD, pt 86% on RA on arrival, sxs began yesterday. Unable to speak in short sentences without running out of breath.  Inhalers at home expired in 2021.

## 2020-07-12 NOTE — ED Notes (Signed)
PCXR now completed-- Pt awake and alert  -- ongoing labored breathing with audible wet lung sounds -- O2 sats 100% on 4L O2 via [place].  Cardiac monitoring initiated sinus tach HR 113

## 2020-07-12 NOTE — ED Provider Notes (Signed)
[facility] Emergency Department Provider Note   ____________________________________________   Event Date/Time   First MD Initiated Contact with Patient 07/12/20 [phone]     (approximate)  I have reviewed the triage vital signs and the nursing notes.   HISTORY  Chief Complaint Shortness of Breath    HPI Stacey [name] is a 75 y.o. female who presents to the ED from home with a chief complaint of shortness of breath.  Patient has a history of COPD, not on chronic oxygen who reports a 1 day history of cough, wheezing and shortness of breath.  Arrives with room air saturations 86%.  Denies fever, chills, abdominal pain, nausea, vomiting or dizziness.  Patient is vaccinated and boosted against COVID-19.     Past Medical History:  Diagnosis Date  . Hypertension   . Thyroid disease     Patient Active Problem List   Diagnosis Date Noted  . Neuralgia and neuritis 03/14/2020  . Primary generalized (osteo)arthritis 11/30/2019  . Encounter for screening mammogram for malignant neoplasm of breast 11/30/2019  . Vasomotor rhinitis 06/28/2019  . Atopic dermatitis 06/28/2019  . Localized superficial swelling, mass, or lump 06/28/2019  . Plantar neuroma of left foot 06/28/2019  . Chronic obstructive pulmonary disease ([place]) 11/03/2018  . Right flank pain 11/03/2018  . Dysuria 11/03/2018  . Acute upper respiratory infection 05/06/2018  . Acquired hypothyroidism 03/18/2018  . Stage 3 chronic kidney disease ([place]) 03/18/2018  . ARF (acute renal failure) ([place]) 01/14/2018  . Hypertensive crisis 01/14/2018  . Acute respiratory failure with hypoxia ([place]) 01/13/2018  . Encounter for general adult medical examination with abnormal findings 10/26/2017  . Allergic rhinitis 10/26/2017  . Vitamin D deficiency 10/26/2017  . Gastroesophageal reflux disease without esophagitis 10/26/2017  . Essential hypertension 04/16/2017  . Thyroid disease 04/16/2017  . 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 Dilated cardiomyopathy ([place]) 06/27/2016  . Bilateral carotid artery stenosis 05/30/2016  . SOBOE (shortness of breath on exertion) 05/30/2016  . Hyperparathyroidism, primary ([place]) 08/25/2015  . Hypercalcemia 08/12/2014  . LVH (left ventricular hypertrophy) due to hypertensive disease, without heart failure 07/09/2014  . Moderate mitral insufficiency 07/09/2014  . Benign essential hypertension 07/06/2014  . 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 Mixed hyperlipidemia 01/08/2014  . Chronic pelvic pain in female 05/31/2011  . Fibroids 05/31/2011  . Sarcoidosis 05/31/2011    Past Surgical History:  Procedure Laterality Date  . EYE SURGERY Right 07/09/2019  . GALLBLADDER SURGERY    . KNEE SURGERY    . thyroidectomy      Prior to Admission medications   Medication Sig Start Date End Date Taking? Authorizing Provider  albuterol (VENTOLIN HFA) 108 (90 Base) MCG/ACT inhaler Inhale 2 puffs into the lungs every 6 (six) hours as needed for wheezing or shortness of breath. 10/31/18   Ronnell Freshwater, NP  amoxicillin-clavulanate (AUGMENTIN) 875-125 MG tablet Take 1 tablet by mouth 2 (two) times daily. 06/29/20   [name], MD  carvedilol (COREG) 6.25 MG tablet Take 1 tablet (6.25 mg total) by mouth 2 (two) times daily with a meal. 01/16/18   Thurnell Lose, MD  cetirizine (ZYRTEC) 10 MG tablet Take 1 tablet (10 mg total) by mouth daily. 03/05/20   Ronnell Freshwater, NP  Cholecalciferol (VITAMIN D3) 3000 units TABS Take 3,000 Units by mouth daily.  05/21/09   [provider]  cinacalcet (SENSIPAR) 30 MG tablet Take 30 mg by mouth once a week.  03/15/16  [provider]  clobetasol ointment (TEMOVATE) 9.60 % Apply 1 application topically 2 (two) times daily as needed (rash).  02/06/16   [provider]  cloNIDine (CATAPRES) 0.1 MG tablet Take 1 tablet (0.1 mg total) by mouth 2 (two) times daily. 05/09/19    Ronnell Freshwater, NP  colchicine 0.6 MG tablet Take 1 tablet (0.6 mg total) by mouth daily. 01/16/18 01/16/19  Thurnell Lose, MD  cyclobenzaprine (FLEXERIL) 10 MG tablet Take 1 tablet (10 mg total) by mouth 3 (three) times daily as needed. 06/25/20   Menshew, Dannielle Karvonen, PA-C  fluticasone (FLONASE) 50 MCG/ACT nasal spray Place 2 sprays into both nostrils daily. 03/23/20   Luiz Ochoa, NP  fluticasone (FLOVENT HFA) 110 MCG/ACT inhaler Take 2 puffs at bed time Patient taking differently: Inhale 2 puffs into the lungs at bedtime as needed (shortness of breath). 05/22/17   [name], MD  furosemide (LASIX) 20 MG tablet Take 2 tablets (40 mg total) by mouth daily. 11/25/19   Ronnell Freshwater, NP  gabapentin (NEURONTIN) 100 MG capsule Take 1 capsule po QPM for one week. May increase to 1 capsule BID as needed and as tolerated. 06/29/20   [name], MD  hydrALAZINE (APRESOLINE) 50 MG tablet Take 2 tablets (100 mg total) by mouth 3 (three) times daily. 01/16/18   Thurnell Lose, MD  HYDROcodone-acetaminophen (NORCO) 5-325 MG tablet Take 1 tablet by mouth 3 (three) times daily as needed. 06/25/20   Menshew, Dannielle Karvonen, PA-C  ketorolac (TORADOL) 10 MG tablet Take 1 tablet (10 mg total) by mouth every 8 (eight) hours. 06/25/20   Menshew, Dannielle Karvonen, PA-C  latanoprost (XALATAN) 0.005 % ophthalmic solution Place 1 drop into both eyes at bedtime.  01/16/14   [provider]  levothyroxine (SYNTHROID, LEVOTHROID) 88 MCG tablet Take 88 mcg by mouth daily before breakfast.  03/11/17   [provider]  losartan (COZAAR) 100 MG tablet Take 1 tablet (100 mg total) by mouth daily. 04/28/20   Luiz Ochoa, NP  montelukast (SINGULAIR) 10 MG tablet Take 1 tablet (10 mg total) by mouth daily. 04/28/20   Luiz Ochoa, NP  Multiple Vitamins-Minerals (MULTIVITAMIN GUMMIES WOMENS) CHEW Chew 2 tablets by mouth daily.    [provider]  naproxen (NAPROSYN) 500 MG tablet Take 1  tablet (500 mg total) by mouth 2 (two) times daily with a meal. 11/14/19   Boscia, Heather E, NP  ondansetron (ZOFRAN ODT) 4 MG disintegrating tablet Take 1 tablet (4 mg total) by mouth every 8 (eight) hours as needed. 06/25/20   Menshew, Dannielle Karvonen, PA-C  ondansetron (ZOFRAN) 8 MG tablet Take by mouth every 8 (eight) hours as needed for nausea or vomiting.    [provider]  pantoprazole (PROTONIX) 40 MG tablet TAKE 1 TABLET BY MOUTH AS NEEDED 04/18/19   Ronnell Freshwater, NP  pravastatin (PRAVACHOL) 20 MG tablet TAKE 1 TABLET(20 MG) BY MOUTH EVERY NIGHT 08/19/19   Boscia, Heather E, NP  RESTASIS 0.05 % ophthalmic emulsion Place 1 drop into both eyes 2 (two) times daily. 11/01/17   [provider]  timolol (TIMOPTIC) 0.25 % ophthalmic solution Place 1 drop into both eyes 2 (two) times daily.  07/30/09   [provider]  triamcinolone ointment (KENALOG) 0.1 % Apply 1 application topically 2 (two) times daily. 11/14/19   Ronnell Freshwater, NP    Allergies Codeine, Propoxyphene, Alendronate, Ciprofloxacin, Lisinopril, Amlodipine, Aspirin, Methotrexate,  Tramadol, and Vicodin [hydrocodone-acetaminophen]  Family History  Problem Relation Age of Onset  . Hypertension Mother   . Diabetes Mother   . Anuerysm Mother   . Diabetes Father   . Hypertension Father     Social History Social History   Tobacco Use  . Smoking status: Never Smoker  . Smokeless tobacco: Never Used  Vaping Use  . Vaping Use: Never used  Substance Use Topics  . Alcohol use: No  . Drug use: Not Currently    Review of Systems  Constitutional: No fever/chills Eyes: No visual changes. ENT: No sore throat. Cardiovascular: Denies chest pain. Respiratory: Positive for cough and shortness of breath. Gastrointestinal: No abdominal pain.  No nausea, no vomiting.  No diarrhea.  No constipation. Genitourinary: Negative for dysuria. Musculoskeletal: Negative for back pain. Skin: Negative for  rash. Neurological: Negative for headaches, focal weakness or numbness.   ____________________________________________   PHYSICAL EXAM:  VITAL SIGNS: ED Triage Vitals  Enc Vitals Group     BP 07/12/20 0606 (!) 197/136     Pulse Rate 07/12/20 0606 (!) 117     Resp 07/12/20 0606 (!) 36     Temp 07/12/20 0606 97.7 F (36.5 C)     Temp Source 07/12/20 0606 Oral     SpO2 07/12/20 0604 (!) 86 %     Weight 07/12/20 0608 150 lb (68 kg)     Height 07/12/20 0608 4\' 9"  (1.448 m)     Head Circumference --      Peak Flow --      Pain Score 07/12/20 0607 0     Pain Loc --      Pain Edu? --      Excl. in [place]? --     Constitutional: Alert and oriented.  Elderly appearing and in moderate acute distress. Eyes: Conjunctivae are normal. PERRL. EOMI. Head: Atraumatic. Nose: No congestion/rhinnorhea. Mouth/Throat: Mucous membranes are moist.   Neck: No stridor.   Cardiovascular: Tachycardic rate, regular rhythm. Grossly normal heart sounds.  Good peripheral circulation. Respiratory: Increased respiratory effort.  Retractions. Lungs with diffuse, audible wheezing. Gastrointestinal: Soft and nontender. No distention. No abdominal bruits. No CVA tenderness. Musculoskeletal: No lower extremity tenderness nor edema.  No joint effusions. Neurologic:  Normal speech and language. No gross focal neurologic deficits are appreciated.  Skin:  Skin is warm, dry and intact. No rash noted. Psychiatric: Mood and affect are normal. Speech and behavior are normal.  ____________________________________________   LABS (all labs ordered are listed, but only abnormal results are displayed)  Labs Reviewed  RESP PANEL BY RT-PCR (FLU A&B, COVID) ARPGX2  CBC WITH DIFFERENTIAL/PLATELET  COMPREHENSIVE METABOLIC PANEL  BRAIN NATRIURETIC PEPTIDE  BLOOD GAS, ARTERIAL  TROPONIN I (HIGH SENSITIVITY)   ____________________________________________  EKG  ED ECG REPORT I, Kiing Deakin J, the attending physician,  personally viewed and interpreted this ECG.   Date: 07/12/2020  EKG Time: 0621  Rate: 111  Rhythm: sinus tachycardia  Axis: Normal  Intervals:none  ST&T Change: Nonspecific  ____________________________________________  RADIOLOGY I, Kostantinos Tallman J, personally viewed and evaluated these images (plain radiographs) as part of my medical decision making, as well as reviewing the written report by the radiologist.  ED MD interpretation: Questionable right middle lobe airspace opacity  Official radiology report(s): DG Chest Port 1 View  Result Date: 07/12/2020 CLINICAL DATA:  Shortness of breath.  History of COPD. EXAM: PORTABLE CHEST 1 VIEW COMPARISON:  Chest x-ray 01/12/2018, CT chest 04/26/2017, CT chest 10/04/2012, CT chest 04/26/2017 FINDINGS:  Redemonstration of a enlarged cardiac silhouette. The heart size and mediastinal contours are unchanged. Aortic arch calcifications. Right middle lobe airspace opacity. Question retrocardiac opacity. Increased interstitial markings. No pleural effusion. No pneumothorax. No acute osseous abnormality. IMPRESSION: 1. Right middle lobe airspace opacities suggestive of infection or inflammation. Also question retrocardiac opacity. Followup PA and lateral chest X-ray is recommended in 3-4 weeks following therapy to ensure resolution and exclude underlying malignancy. 2. Mild pulmonary edema. 3.  Aortic Atherosclerosis (ICD10-I70.0). Electronically Signed   By: Iven Finn M.D.   On: 07/12/2020 06:55    ____________________________________________   PROCEDURES  Procedure(s) performed (including Critical Care):  .1-3 Lead EKG Interpretation Performed by: Paulette Blanch, MD Authorized by: Paulette Blanch, MD     Interpretation: abnormal     ECG rate:  115   ECG rate assessment: tachycardic     Rhythm: sinus tachycardia     Ectopy: none     Conduction: normal   Comments:     Patient placed on cardiac monitor to evaluate for arrhythmias    CRITICAL  CARE Performed by: Paulette Blanch   Total critical care time: 30 minutes  Critical care time was exclusive of separately billable procedures and treating other patients.  Critical care was necessary to treat or prevent imminent or life-threatening deterioration.  Critical care was time spent personally by me on the following activities: development of treatment plan with patient and/or surrogate as well as nursing, discussions with consultants, evaluation of patient's response to treatment, examination of patient, obtaining history from patient or surrogate, ordering and performing treatments and interventions, ordering and review of laboratory studies, ordering and review of radiographic studies, pulse oximetry and re-evaluation of patient's condition.  ____________________________________________   INITIAL IMPRESSION / ASSESSMENT AND PLAN / ED COURSE  As part of my medical decision making, I reviewed the following data within the [facility] notes reviewed and incorporated, Labs reviewed, EKG interpreted, Old chart reviewed, Radiograph reviewed and Notes from prior ED visits     75 year old female with COPD presenting with respiratory distress and hypoxia Differential includes, but is not limited to, viral syndrome, bronchitis including COPD exacerbation, pneumonia, reactive airway disease including asthma, CHF including exacerbation with or without pulmonary/interstitial edema, pneumothorax, ACS, thoracic trauma, and pulmonary embolism.  On nasal cannula oxygen patient saturations are 98%.  Will obtain lab work, chest x-ray.  Administer IV Solu-Medrol, DuoNeb.  Anticipate hospitalization.  Clinical Course as of 07/12/20 4332  Mon Jul 12, 2020  9518 Chest x-ray appears more pulmonary edema.  Patient tells me she takes Lasix 20 mg daily.  Breathing is more labored.  Will trial BiPAP. [JS]  0701 Patient looking more comfortable on BiPAP.  Care transferred to Dr. Tamala Julian  at change of shift.  Anticipate hospitalization. [JS]    Clinical Course User Index [JS] Paulette Blanch, MD     ____________________________________________   FINAL CLINICAL IMPRESSION(S) / ED DIAGNOSES  Final diagnoses:  COPD exacerbation ([place])  Hypoxia  Acute on chronic congestive heart failure, unspecified heart failure type [facility])     ED Discharge Orders    None      *Please note:  Stacey [name] was evaluated in Emergency Department on 07/12/2020 for the symptoms described in the history of present illness. She was evaluated in the context of the global COVID-19 pandemic, which necessitated consideration that the patient might be at risk for infection with the SARS-CoV-2 virus that causes COVID-19. Institutional protocols and algorithms  that pertain to the evaluation of patients at risk for COVID-19 are in a state of rapid change based on information released by regulatory bodies including the CDC and federal and state organizations. These policies and algorithms were followed during the patient's care in the ED.  Some ED evaluations and interventions may be delayed as a result of limited staffing during and the pandemic.*   Note:  This document was prepared using Dragon voice recognition software and may include unintentional dictation errors.   Paulette Blanch, MD 07/12/20 [phone]

## 2020-07-13 ENCOUNTER — Inpatient Hospital Stay: Payer: Medicare Other

## 2020-07-13 LAB — BASIC METABOLIC PANEL
Anion gap: 10 (ref 5–15)
BUN: 32 mg/dL — ABNORMAL HIGH (ref 8–23)
CO2: 23 mmol/L (ref 22–32)
Calcium: 10.3 mg/dL (ref 8.9–10.3)
Chloride: 103 mmol/L (ref 98–111)
Creatinine, Ser: 1.74 mg/dL — ABNORMAL HIGH (ref 0.44–1.00)
GFR, Estimated: 31 mL/min — ABNORMAL LOW (ref >60)
Glucose, Bld: 166 mg/dL — ABNORMAL HIGH (ref 70–99)
Potassium: 3.6 mmol/L (ref 3.5–5.1)
Sodium: 136 mmol/L (ref 135–145)

## 2020-07-13 LAB — HEMOGLOBIN A1C
Hgb A1c MFr Bld: 6.2 % — ABNORMAL HIGH (ref 4.8–5.6)
Mean Plasma Glucose: 131.24 mg/dL

## 2020-07-13 LAB — LIPID PANEL
Cholesterol: 143 mg/dL (ref 0–200)
HDL: 49 mg/dL (ref >40)
LDL Cholesterol: 83 mg/dL (ref 0–99)
Total CHOL/HDL Ratio: 2.9 RATIO
Triglycerides: 57 mg/dL (ref <150)
VLDL: 11 mg/dL (ref 0–40)

## 2020-07-13 LAB — CBC
HCT: 32.2 % — ABNORMAL LOW (ref 36.0–46.0)
Hemoglobin: 10.7 g/dL — ABNORMAL LOW (ref 12.0–15.0)
MCH: 28.5 pg (ref 26.0–34.0)
MCHC: 33.2 g/dL (ref 30.0–36.0)
MCV: 85.9 fL (ref 80.0–100.0)
Platelets: 201 10*3/uL (ref 150–400)
RBC: 3.75 MIL/uL — ABNORMAL LOW (ref 3.87–5.11)
RDW: 14.6 % (ref 11.5–15.5)
WBC: 11.2 10*3/uL — ABNORMAL HIGH (ref 4.0–10.5)
nRBC: 0 % (ref 0.0–0.2)

## 2020-07-13 LAB — PROCALCITONIN: Procalcitonin: 0.29 ng/mL

## 2020-07-13 LAB — MAGNESIUM: Magnesium: 1.7 mg/dL (ref 1.7–2.4)

## 2020-07-13 MED ORDER — FUROSEMIDE 10 MG/ML IJ SOLN
40.0000 mg | Freq: Every day | INTRAMUSCULAR | Status: DC
  Administered 2020-07-14: 40 mg via INTRAVENOUS
  Filled 2020-07-13: qty 4

## 2020-07-13 MED ORDER — CARVEDILOL 6.25 MG PO TABS
6.2500 mg | ORAL_TABLET | Freq: Two times a day (BID) | ORAL | Status: DC
  Administered 2020-07-13 – 2020-07-15 (×5): 6.25 mg via ORAL
  Filled 2020-07-13 (×5): qty 1

## 2020-07-13 NOTE — Progress Notes (Signed)
PROGRESS NOTE    Stacey [name]  VOH:[id number] DOB: 07/26/1945 DOA: 07/12/2020 PCP: [name], MD   Brief Narrative: Taken from H&P. Stacey [name] is a 75 y.o. 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 On arrival patient was desaturated on room air, initially requiring BiPAP, later transitioned to nasal cannula.  This morning she was saturating 100% on 4 L of oxygen. COVID PCR was negative.  Markedly elevated BNP at 1015, AKI and chest x-ray with some concern of small right middle lobe opacity and mild pulmonary edema. Responded well to IV Lasix. Cardiology was consulted-repeat echocardiogram with severely reduced EF of 20 to 25% with dilated cardiomyopathy, cardiology is not recommending any further cardiac work-up. Home dose of Lasix can be switched with torsemide on discharge. She will get benefit with a close follow-up at heart failure clinic preferably at [facility].  Subjective: Patient seems improving when seen today.  Still little short of breath but able to sleep pretty flat overnight.  She does not use any oxygen at baseline.  Assessment & Plan:   Principal Problem:   Acute on chronic diastolic CHF (congestive heart failure) (HCC) Active Problems:   Essential hypertension   Dilated cardiomyopathy (HCC)   Hypercalcemia   Hyperparathyroidism, primary ([place])   Mixed hyperlipidemia   Gastroesophageal reflux disease without esophagitis   Acute respiratory failure with hypoxia (HCC)   CKD (chronic kidney disease), stage IIIa   Chronic obstructive pulmonary disease (HCC)   Hypertensive emergency   Elevated troponin   Hypothyroidism  Acute hypoxic respiratory failure secondary to acute on chronic HFrEF/dilated cardiomyopathy/hypertensive emergency.  Repeat echocardiogram with severely reduced EF  and grade 1 diastolic dysfunction, dilated cardiomyopathy. Clinically improving with IV diuresis-decrease the dose of Lasix to once daily due to increasing creatinine today. -Try weaning her off from oxygen. -Daily BMP and weight -Strict intake and output -Patient will get benefit from switching home dose of Lasix with torsemide and a close follow-up at heart failure clinic.  Hypertension.  She was initially started on nitroglycerin drip in ED for hypertensive emergency. Blood pressure now within goal. -Discontinue nitroglycerin infusion -Continue home meds which include Coreg, clonidine, hydralazine and Cozaar  Elevated troponin.  Peaked at 45, now trending down.  Most likely secondary to demand ischemia.  No chest pain.  Cardiology is not recommending any further work-up. -Continue pravastatin. -Patient is allergic to aspirin  Hypercalcemia and hx of hyperparathyroidism, primary ([place]): Calcium 10.3, which is basically upper normal limit.  Has an history of hyperparathyroidism. -She will need outpatient follow-up -Avoid any calcium supplement  Gastroesophageal reflux disease without esophagitis -Protonix  AKI with CKD (chronic kidney disease), stage IIIa: Slightly worsening.  Baseline creatinine 1.2 on 03/30/2020, it was at 1.43 on admission which bumped up to 1.74 today. -Decrease the dose of IV Lasix -Hold NSAID and avoid nephrotoxins -Monitor renal function  Chronic obstructive pulmonary disease ([place]): No wheezing on auscultation. -Bronchodilators and Singulair -Patient received 1 dose of Solu-Medrol 105 mg in ED  Hypothyroidism -Synthroid  Leukocytosis.  Patient did receive Solu-Medrol yesterday.  Remained afebrile. There is one concerning opacity in the right middle lobe. -Do 2 view chest x-ray. -Check procalcitonin-we will start antibiotics if procalcitonin positive along with chest x-ray confirming dose infiltrate.  Objective: Vitals:   07/13/20 0521 07/13/20 0754  07/13/20 0822 07/13/20 1052  BP: (!) 152/92  137/79  122/65  Pulse: 79  73 82  Resp: 20  18 20   Temp: 97.9 F (36.6 C)  97.6 F (36.4 C) (!) 97.5 F (36.4 C)  TempSrc: Oral  Oral   SpO2: 100% 100% 100% 97%  Weight:      Height:        Intake/Output Summary (Last 24 hours) at 07/13/2020 1234 Last data filed at 07/13/2020 0950 Gross per 24 hour  Intake 358.87 ml  Output 600 ml  Net -241.13 ml   Filed Weights   07/12/20 0608 07/12/20 1826 07/13/20 0500  Weight: 68 kg 69.4 kg 68 kg    Examination:  General exam: Appears calm and comfortable  Respiratory system: Few basal crackles, respiratory effort normal. Cardiovascular system: S1 & S2 heard, RRR.  Gastrointestinal system: Soft, nontender, nondistended, bowel sounds positive. Central nervous system: Alert and oriented. No focal neurological deficits.Symmetric 5 x 5 power. Extremities: No edema, no cyanosis, pulses intact and symmetrical. Psychiatry: Judgement and insight appear normal. Mood & affect appropriate.    DVT prophylaxis: Lovenox Code Status: Full Family Communication: Discussed with patient Disposition Plan:  Status is: Inpatient  Remains inpatient appropriate because:Inpatient level of care appropriate due to severity of illness   Dispo: The patient is from: Home              Anticipated d/c is to: Home              Patient currently is not medically stable to d/c.   Difficult to place patient No                Level of care: Progressive Cardiac  All the records are reviewed and case discussed with Care Management/Social Worker. Management plans discussed with the patient, nursing and they are in agreement.  Consultants:   Cardiology  Procedures:  Antimicrobials:   Data Reviewed: I have personally reviewed following labs and imaging studies  CBC: Recent Labs  Lab 07/12/20 0623 07/13/20 0453  WBC 6.9 11.2*  NEUTROABS 4.6  --   HGB 13.1 10.7*  HCT 40.4 32.2*  MCV 86.3 85.9  PLT 241 664    Basic Metabolic Panel: Recent Labs  Lab 07/12/20 0623 07/13/20 0453  NA 138 136  K 4.3 3.6  CL 107 103  CO2 24 23  GLUCOSE 187* 166*  BUN 23 32*  CREATININE 1.43* 1.74*  CALCIUM 10.4* 10.3  MG  --  1.7   GFR: Estimated Creatinine Clearance: 23.3 mL/min (A) (by C-G formula based on SCr of 1.74 mg/dL (H)). Liver Function Tests: Recent Labs  Lab 07/12/20 0623  AST 36  ALT 25  ALKPHOS 73  BILITOT 0.9  PROT 7.9  ALBUMIN 3.7   No results for input(s): LIPASE, AMYLASE in the last 168 hours. No results for input(s): AMMONIA in the last 168 hours. Coagulation Profile: No results for input(s): INR, PROTIME in the last 168 hours. Cardiac Enzymes: No results for input(s): CKTOTAL, CKMB, CKMBINDEX, TROPONINI in the last 168 hours. BNP (last 3 results) No results for input(s): PROBNP in the last 8760 hours. HbA1C: Recent Labs    07/13/20 0453  HGBA1C 6.2*   CBG: No results for input(s): GLUCAP in the last 168 hours. Lipid Profile: Recent Labs    07/13/20 0453  CHOL 143  HDL 49  LDLCALC 83  TRIG 57  CHOLHDL 2.9   Thyroid Function Tests: No results for input(s): TSH, T4TOTAL, FREET4, T3FREE, THYROIDAB in the last 72 hours. Anemia Panel: No results  for input(s): VITAMINB12, FOLATE, FERRITIN, TIBC, IRON, RETICCTPCT in the last 72 hours. Sepsis Labs: No results for input(s): PROCALCITON, LATICACIDVEN in the last 168 hours.  Recent Results (from the past 240 hour(s))  Resp Panel by RT-PCR (Flu A&B, Covid) Nasopharyngeal Swab     Status: None   Collection Time: 07/12/20  6:23 AM   Specimen: Nasopharyngeal Swab; Nasopharyngeal(NP) swabs in vial transport medium  Result Value Ref Range Status   SARS Coronavirus 2 by RT PCR NEGATIVE NEGATIVE Final    Comment: (NOTE) SARS-CoV-2 target nucleic acids are NOT DETECTED.  The SARS-CoV-2 RNA is generally detectable in upper respiratory specimens during the acute phase of infection. The lowest concentration of SARS-CoV-2  viral copies this assay can detect is 138 copies/mL. A negative result does not preclude SARS-Cov-2 infection and should not be used as the sole basis for treatment or other patient management decisions. A negative result may occur with  improper specimen collection/handling, submission of specimen other than nasopharyngeal swab, presence of viral mutation(s) within the areas targeted by this assay, and inadequate number of viral copies(<138 copies/mL). A negative result must be combined with clinical observations, patient history, and epidemiological information. The expected result is Negative.  Fact Sheet for Patients:  EntrepreneurPulse.com.au  Fact Sheet for Healthcare Providers:  IncredibleEmployment.be  This test is no t yet approved or cleared by the [place] FDA and  has been authorized for detection and/or diagnosis of SARS-CoV-2 by FDA under an Emergency Use Authorization (EUA). This EUA will remain  in effect (meaning this test can be used) for the duration of the COVID-19 declaration under Section 564(b)(1) of the Act, 21 U.S.C.section 360bbb-3(b)(1), unless the authorization is terminated  or revoked sooner.       Influenza A by PCR NEGATIVE NEGATIVE Final   Influenza B by PCR NEGATIVE NEGATIVE Final    Comment: (NOTE) The Xpert Xpress SARS-CoV-2/FLU/RSV plus assay is intended as an aid in the diagnosis of influenza from Nasopharyngeal swab specimens and should not be used as a sole basis for treatment. Nasal washings and aspirates are unacceptable for Xpert Xpress SARS-CoV-2/FLU/RSV testing.  Fact Sheet for Patients: EntrepreneurPulse.com.au  Fact Sheet for Healthcare Providers: IncredibleEmployment.be  This test is not yet approved or cleared by the [place] FDA and has been authorized for detection and/or diagnosis of SARS-CoV-2 by FDA under an Emergency Use Authorization  (EUA). This EUA will remain in effect (meaning this test can be used) for the duration of the COVID-19 declaration under Section 564(b)(1) of the Act, 21 U.S.C. section 360bbb-3(b)(1), unless the authorization is terminated or revoked.  Performed at [facility], [address] [place], [place] [address]      Radiology Studies: [facility] Chest [place] 1 View  Result Date: 07/12/2020 CLINICAL DATA:  Shortness of breath.  History of COPD. EXAM: PORTABLE CHEST 1 VIEW COMPARISON:  Chest x-ray 01/12/2018, CT chest 04/26/2017, CT chest 10/04/2012, CT chest 04/26/2017 FINDINGS: Redemonstration of a enlarged cardiac silhouette. The heart size and mediastinal contours are unchanged. Aortic arch calcifications. Right middle lobe airspace opacity. Question retrocardiac opacity. Increased interstitial markings. No pleural effusion. No pneumothorax. No acute osseous abnormality. IMPRESSION: 1. Right middle lobe airspace opacities suggestive of infection or inflammation. Also question retrocardiac opacity. Followup PA and lateral chest X-ray is recommended in 3-4 weeks following therapy to ensure resolution and exclude underlying malignancy. 2. Mild pulmonary edema. 3.  Aortic Atherosclerosis (ICD10-I70.0). Electronically Signed   By: Iven Finn M.D.   On: 07/12/2020  06:55   ECHOCARDIOGRAM COMPLETE  Result Date: 07/12/2020    ECHOCARDIOGRAM REPORT   Patient Name:   Stacey [name] Date of Exam: 07/12/2020 Medical Rec #:  [id number]      Height:       57.0 in Accession #:    [id number]     Weight:       150.0 lb Date of Birth:  [birth year]     BSA:          1.592 m Patient Age:    71 years       BP:           169/107 mmHg Patient Gender: F              HR:           77 bpm. Exam Location:  [facility] Procedure: 2D Echo, Color Doppler, Cardiac Doppler and Strain Analysis Indications:     I50.31 CHF-Acute Diastolic  History:         Patient has prior history of Echocardiogram examinations. Risk                   Factors:Hypertension.  Sonographer:     Charmayne Sheer RDCS (AE) Referring Phys:  [id number] Soledad Gerlach NIU Diagnosing Phys: Serafina Royals MD  Sonographer Comments: Global longitudinal strain was attempted. IMPRESSIONS  1. Left ventricular ejection fraction, by estimation, is 20 to 25%. The left ventricle has severely decreased function. The left ventricle demonstrates global hypokinesis. The left ventricular internal cavity size was mildly dilated. Left ventricular diastolic parameters are consistent with Grade II diastolic dysfunction (pseudonormalization).  2. Right ventricular systolic function is normal. The right ventricular size is normal.  3. The mitral valve is normal in structure. Mild mitral valve regurgitation.  4. The aortic valve is calcified. Aortic valve regurgitation is mild. Mild aortic valve stenosis. FINDINGS  Left Ventricle: Left ventricular ejection fraction, by estimation, is 20 to 25%. The left ventricle has severely decreased function. The left ventricle demonstrates global hypokinesis. The left ventricular internal cavity size was mildly dilated. There is no left ventricular hypertrophy. Left ventricular diastolic parameters are consistent with Grade II diastolic dysfunction (pseudonormalization). Right Ventricle: The right ventricular size is normal. No increase in right ventricular wall thickness. Right ventricular systolic function is normal. Left Atrium: Left atrial size was normal in size. Right Atrium: Right atrial size was normal in size. Pericardium: There is no evidence of pericardial effusion. Mitral Valve: The mitral valve is normal in structure. Mild mitral valve regurgitation. MV peak gradient, 6.0 mmHg. The mean mitral valve gradient is 2.0 mmHg. Tricuspid Valve: The tricuspid valve is normal in structure. Tricuspid valve regurgitation is mild. Aortic Valve: The aortic valve is calcified. Aortic valve regurgitation is mild. Aortic regurgitation PHT measures 582 msec. Mild aortic stenosis is  present. Aortic valve mean gradient measures 6.0 mmHg. Aortic valve peak gradient measures 10.5 mmHg. Aortic valve area, by VTI measures 1.23 cm. Pulmonic Valve: The pulmonic valve was normal in structure. Pulmonic valve regurgitation is not visualized. Aorta: The aortic root and ascending aorta are structurally normal, with no evidence of dilitation. IAS/Shunts: No atrial level shunt detected by color flow Doppler.  LEFT VENTRICLE PLAX 2D LVIDd:         5.20 cm  Diastology LVIDs:         4.20 cm  LV e' medial:    3.26 cm/s LV PW:         1.00 cm  LV E/e' medial:  24.0 LV IVS:        0.80 cm  LV e' lateral:   2.61 cm/s LVOT diam:     1.70 cm  LV E/e' lateral: 30.0 LV SV:         35 LV SV Index:   22 LVOT Area:     2.27 cm  RIGHT VENTRICLE RV Basal diam:  3.10 cm LEFT ATRIUM             Index       RIGHT ATRIUM          Index LA diam:        3.70 cm 2.32 cm/m  RA Area:     8.70 cm LA Vol (A2C):   30.0 ml 18.85 ml/m RA Volume:   13.00 ml 8.17 ml/m LA Vol (A4C):   48.5 ml 30.47 ml/m LA Biplane Vol: 39.6 ml 24.88 ml/m  AORTIC VALVE                    PULMONIC VALVE AV Area (Vmax):    1.42 cm     PV Vmax:       0.84 m/s AV Area (Vmean):   1.20 cm     PV Vmean:      50.100 cm/s AV Area (VTI):     1.23 cm     PV VTI:        0.128 m AV Vmax:           162.00 cm/s  PV Peak grad:  2.9 mmHg AV Vmean:          116.000 cm/s PV Mean grad:  1.0 mmHg AV VTI:            0.287 m AV Peak Grad:      10.5 mmHg AV Mean Grad:      6.0 mmHg LVOT Vmax:         101.00 cm/s LVOT Vmean:        61.400 cm/s LVOT VTI:          0.155 m LVOT/AV VTI ratio: 0.54 AI PHT:            582 msec  AORTA Ao Root diam: 2.70 cm MITRAL VALVE MV Area (PHT): 4.86 cm     SHUNTS MV Area VTI:   2.03 cm     Systemic VTI:  0.16 m MV Peak grad:  6.0 mmHg     Systemic Diam: 1.70 cm MV Mean grad:  2.0 mmHg MV Vmax:       1.22 m/s MV Vmean:      62.6 cm/s MV Decel Time: 156 msec MV E velocity: 78.40 cm/s MV A velocity: 111.00 cm/s MV E/A ratio:  0.71 Serafina Royals MD Electronically signed by Serafina Royals MD Signature Date/Time: 07/12/2020/5:06:07 PM    Final     Scheduled Meds: . carvedilol  6.25 mg Oral BID WC  . cloNIDine  0.1 mg Oral BID  . enoxaparin (LOVENOX) injection  30 mg Subcutaneous Q24H  . [START ON 07/14/2020] furosemide  40 mg Intravenous Daily  . hydrALAZINE  100 mg Oral TID  . ipratropium-albuterol  3 mL Nebulization Q6H  . sodium chloride flush  3 mL Intravenous Q12H   Continuous Infusions: . sodium chloride       LOS: 1 day   Time spent: 36 minutes More than 50% of the time was spent in counseling/coordination of care  Lorella Nimrod, MD [facility]  If 7PM-7AM, please  contact night-coverage [email or web address]  07/13/2020, 12:34 PM   This record has been created using Systems analyst. Errors have been sought and corrected,but may not always be located. Such creation errors do not reflect on the standard of care.

## 2020-07-13 NOTE — Evaluation (Signed)
Occupational Therapy Evaluation Patient Details Name: Stacey [name] MRN: [id number] DOB: [birth year] Today's Date: 07/13/2020    History of Present Illness Patient is a 75 y.o. 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 Found to have acute respiratory failure with hypoxia due to acute on chronic diastolic CHF and hypertensive emergency and hx of dilated cardiomyopathy, essential hypertension and hypertensive urgency, elevated troponin   Clinical Impression   Pt seen for OT evaluation/treatment on this date. Upon arrival to room, pt seated upright in recliner on 1L of O2 via [place] (SpO2 95%, BP 141/81). Prior to admission, pt was independent with ADLs and intermittently using a cane during functional mobility d/t sciatic nerve pain. Pt does not use O2 at baseline. Pt reports that spouse assists with IADLs.  Pt currently presents with decreased activity tolerance and balance, and requires MIN GUARD for functional mobility of short household distances (58ft, x2 times) with RW, MIN GUARD for toilet transfer/peri-care, SUPERVISION/SET-UP for seated grooming, and MIN-MOD A for LB bathing/dressing. [place] doffed prior to each bout of ambulation, with SpO2 89% following each bout and remaining 89-91% after 1-min of seated rest break and cues for PLB; RN informed. [place] donned following each bout, with SpO2 able to increase to >92% within 15-sec. At end of session, pt left in recliner, on 1L of O2 with SpO2 >93% and RN present.   Pt would benefit from additional skilled OT services to maximize return to PLOF and minimize risk of future falls, injury, caregiver burden, and readmission. Upon discharge, recommend HHOT and supervision/assistance from spouse PRN.     Follow Up Recommendations  Home health OT;Supervision - Intermittent     Equipment Recommendations  Other (comment) (2ww)       Precautions / Restrictions Precautions Precautions: Fall Restrictions Weight Bearing Restrictions: No      Mobility Bed Mobility               General bed mobility comments: Not assessed as pt in recliner at beginning and end of session    Transfers Overall transfer level: Needs assistance Equipment used: Rolling walker (2 wheeled) Transfers: Sit to/from Stand Sit to Stand: Min guard         General transfer comment: Min guard for safety. Required cues for safe hand placement    Balance Overall balance assessment: Needs assistance Sitting-balance support: No upper extremity supported;Feet unsupported Sitting balance-Leahy Scale: Good Sitting balance - Comments: Good sitting balance EOB during seated LB bathing   Standing balance support: Single extremity supported;During functional activity Standing balance-Leahy Scale: Good Standing balance comment: MIN GUARD to perform standing peri-care                           ADL either performed or assessed with clinical judgement   ADL Overall ADL's : Needs assistance/impaired     Grooming: Wash/dry hands;Supervision/safety;Set up;Sitting Grooming Details (indicate cue type and reason): Sitting EOB with feet unsupported, pt able to wash hands with washcloth     Lower Body Bathing: Sit to/from stand;Minimal assistance Lower Body Bathing Details (indicate cue type and reason): MIN A to wash feet     Lower Body Dressing: Moderate assistance;Sitting/lateral leans Lower Body Dressing Details (indicate cue type and reason): While sitting in recliner with LE elevated, pt required MOD A to don socks in setting of fatigue  following doffing socks Toilet Transfer: Min Forensic psychologist Details (indicate cue type and reason): MIN GUARD d/t elevated height of BSC Toileting- Clothing Manipulation and Hygiene: Min guard;Set up;Sit to/from stand        Functional mobility during ADLs: Min guard;Rolling walker (Able to walk 66ft with RW x2 times)       Vision Baseline Vision/History: Wears glasses Wears Glasses: At all times              Pertinent Vitals/Pain Pain Assessment: No/denies pain        Extremity/Trunk Assessment Upper Extremity Assessment Upper Extremity Assessment: Overall WFL for tasks assessed   Lower Extremity Assessment Lower Extremity Assessment: Generalized weakness       Communication Communication Communication: No difficulties   Cognition Arousal/Alertness: Awake/alert Behavior During Therapy: WFL for tasks assessed/performed Overall Cognitive Status: Within Functional Limits for tasks assessed                                     General Comments               Home Living Family/patient expects to be discharged to:: Private residence Living Arrangements: Spouse/significant other Available Help at Discharge: Family;Available PRN/intermittently (spouse works during the day but is home at night) Type of Home: House Home Access: Stairs to enter [employer] of Steps: 3 Entrance Stairs-Rails: Left;Right (cant reach both at the same time) Home Layout: One level     Bathroom Shower/Tub: Occupational psychologist: Handicapped height     Home Equipment: [place] - single point;Grab bars - tub/shower;Hand held shower head          Prior Functioning/Environment Level of Independence: Independent with assistive device(s)        Comments: Patient reports intermittently using a cane with ambulation due to sciatic nerve pain. Pt is independent with ADLs. Spouse assists with IADLs        OT Problem List: Decreased strength;Decreased activity tolerance;Impaired balance (sitting and/or standing);Cardiopulmonary status limiting activity      OT Treatment/Interventions: Self-care/ADL training;Therapeutic exercise;Energy conservation;DME and/or AE  instruction;Therapeutic activities;Visual/perceptual remediation/compensation;Patient/family education;Balance training    OT Goals(Current goals can be found in the care plan section) Acute Rehab OT Goals Patient Stated Goal: to go home tomorrow OT Goal Formulation: With patient Time For Goal Achievement: 07/27/20 Potential to Achieve Goals: Good ADL Goals Pt Will Perform Grooming: with modified independence;standing Pt Will Perform Lower Body Dressing: with modified independence;sit to/from stand Pt Will Transfer to Toilet: with modified independence;ambulating;regular height toilet  OT Frequency: Min 1X/week    AM-PAC OT "6 Clicks" Daily Activity     Outcome Measure Help from another person eating meals?: None Help from another person taking care of personal grooming?: A Little Help from another person toileting, which includes using toliet, bedpan, or urinal?: A Little Help from another person bathing (including washing, rinsing, drying)?: A Little Help from another person to put on and taking off regular upper body clothing?: A Little Help from another person to put on and taking off regular lower body clothing?: A Lot 6 Click Score: 18   End of Session Equipment Utilized During Treatment: Rolling walker;Oxygen Nurse Communication: Mobility status;Other (comment) (SpO2 during mobility)  Activity Tolerance: Patient tolerated treatment well Patient left: in chair;with call bell/phone within reach;with nursing/sitter in room  OT Visit Diagnosis: Muscle weakness (generalized) (M62.81);Unsteadiness on feet (R26.81)  Time: 2993-7169 OT Time Calculation (min): 48 min Charges:  OT General Charges $OT Visit: 1 Visit OT Evaluation $OT Eval Moderate Complexity: 1 Mod OT Treatments $Self Care/Home Management : 38-52 mins  Fredirick Maudlin, OTR/L [place]

## 2020-07-13 NOTE — Progress Notes (Signed)
[facility] Cardiology [facility] Encounter Note  Patient: Stacey [name] / Admit Date: 07/12/2020 / Date of Encounter: 07/13/2020, 8:38 AM   Subjective: 4/26.  Patient feeling much better today with less shortness of breath and not requiring BiPAP at this time.  Oxygenation still requiring some supplement.  Patient's troponin 76/65/97 suggesting demand ischemia rather than acute coronary syndrome.  Telemetry has shown normal sinus rhythm Patient feeling slightly better.  Overall urine output net is 650 mils.  Blood pressure more controlled today than yesterday  Echocardiogram showing severe global LV systolic dysfunction with ejection fraction of 25% with some moderate mitral and tricuspid regurgitation  Review of Systems: Positive for: Shortness of breath Negative for: Vision change, hearing change, syncope, dizziness, nausea, vomiting,diarrhea, bloody stool, stomach pain, cough, congestion, diaphoresis, urinary frequency, urinary pain,skin lesions, skin rashes Others previously listed  Objective: Telemetry: Normal sinus rhythm Physical Exam: Blood pressure 137/79, pulse 73, temperature 97.6 F (36.4 C), temperature source Oral, resp. rate 18, height 4\' 9"  (1.448 m), weight 68 kg, SpO2 100 %. Body mass index is 32.44 kg/m. General: Well developed, well nourished, in no acute distress. Head: Normocephalic, atraumatic, sclera non-icteric, no xanthomas, nares are without discharge. Neck: No apparent masses Lungs: Normal respirations with some wheezes, no rhonchi, few basilar rales , no crackles   Heart: Regular rate and rhythm, normal S1 S2, no murmur, no rub, no gallop, PMI is normal size and placement, carotid upstroke normal without bruit, jugular venous pressure normal Abdomen: Soft, non-tender, non-distended with normoactive bowel sounds. No hepatosplenomegaly. Abdominal aorta is normal size without bruit Extremities: Trace edema, no clubbing, no cyanosis, no ulcers,  Peripheral: 2+  radial, 2+ femoral, 2+ dorsal pedal pulses Neuro: Alert and oriented. Moves all extremities spontaneously. Psych:  Responds to questions appropriately with a normal affect.   Intake/Output Summary (Last 24 hours) at 07/13/2020 0838 Last data filed at 07/13/2020 9211 Gross per 24 hour  Intake 118.87 ml  Output 800 ml  Net -681.13 ml    Inpatient Medications:  . cloNIDine  0.1 mg Oral BID  . enoxaparin (LOVENOX) injection  30 mg Subcutaneous Q24H  . furosemide  40 mg Intravenous Q12H  . hydrALAZINE  100 mg Oral TID  . ipratropium-albuterol  3 mL Nebulization Q6H  . sodium chloride flush  3 mL Intravenous Q12H   Infusions:  . sodium chloride    . nitroGLYCERIN 20 mcg/min (07/12/20 1234)    Labs: Recent Labs    07/12/20 0623 07/13/20 0453  NA 138 136  K 4.3 3.6  CL 107 103  CO2 24 23  GLUCOSE 187* 166*  BUN 23 32*  CREATININE 1.43* 1.74*  CALCIUM 10.4* 10.3  MG  --  1.7   Recent Labs    07/12/20 0623  AST 36  ALT 25  ALKPHOS 73  BILITOT 0.9  PROT 7.9  ALBUMIN 3.7   Recent Labs    07/12/20 0623 07/13/20 0453  WBC 6.9 11.2*  NEUTROABS 4.6  --   HGB 13.1 10.7*  HCT 40.4 32.2*  MCV 86.3 85.9  PLT 241 201   No results for input(s): CKTOTAL, CKMB, TROPONINI in the last 72 hours. Invalid input(s): POCBNP No results for input(s): HGBA1C in the last 72 hours.   Weights: Filed Weights   07/12/20 0608 07/12/20 1826 07/13/20 0500  Weight: 68 kg 69.4 kg 68 kg     Radiology/Studies:  DG Chest Port 1 View  Result Date: 07/12/2020 CLINICAL DATA:  Shortness of breath.  History of COPD. EXAM: PORTABLE CHEST 1 VIEW COMPARISON:  Chest x-ray 01/12/2018, CT chest 04/26/2017, CT chest 10/04/2012, CT chest 04/26/2017 FINDINGS: Redemonstration of a enlarged cardiac silhouette. The heart size and mediastinal contours are unchanged. Aortic arch calcifications. Right middle lobe airspace opacity. Question retrocardiac opacity. Increased interstitial markings. No pleural  effusion. No pneumothorax. No acute osseous abnormality. IMPRESSION: 1. Right middle lobe airspace opacities suggestive of infection or inflammation. Also question retrocardiac opacity. Followup PA and lateral chest X-ray is recommended in 3-4 weeks following therapy to ensure resolution and exclude underlying malignancy. 2. Mild pulmonary edema. 3.  Aortic Atherosclerosis (ICD10-I70.0). Electronically Signed   By: Iven Finn M.D.   On: 07/12/2020 06:55   ECHOCARDIOGRAM COMPLETE  Result Date: 07/12/2020    ECHOCARDIOGRAM REPORT   Patient Name:   Stacey [name] Date of Exam: 07/12/2020 Medical Rec #:  [id number]      Height:       57.0 in Accession #:    [id number]     Weight:       150.0 lb Date of Birth:  11/04/1945     BSA:          1.592 m Patient Age:    75 years       BP:           169/107 mmHg Patient Gender: F              HR:           77 bpm. Exam Location:  [facility] Procedure: 2D Echo, Color Doppler, Cardiac Doppler and Strain Analysis Indications:     I50.31 CHF-Acute Diastolic  History:         Patient has prior history of Echocardiogram examinations. Risk                  Factors:Hypertension.  Sonographer:     Charmayne Sheer RDCS (AE) Referring Phys:  [id number] Soledad Gerlach NIU Diagnosing Phys: Serafina Royals MD  Sonographer Comments: Global longitudinal strain was attempted. IMPRESSIONS  1. Left ventricular ejection fraction, by estimation, is 20 to 25%. The left ventricle has severely decreased function. The left ventricle demonstrates global hypokinesis. The left ventricular internal cavity size was mildly dilated. Left ventricular diastolic parameters are consistent with Grade II diastolic dysfunction (pseudonormalization).  2. Right ventricular systolic function is normal. The right ventricular size is normal.  3. The mitral valve is normal in structure. Mild mitral valve regurgitation.  4. The aortic valve is calcified. Aortic valve regurgitation is mild. Mild aortic valve stenosis. FINDINGS  Left Ventricle:  Left ventricular ejection fraction, by estimation, is 20 to 25%. The left ventricle has severely decreased function. The left ventricle demonstrates global hypokinesis. The left ventricular internal cavity size was mildly dilated. There is no left ventricular hypertrophy. Left ventricular diastolic parameters are consistent with Grade II diastolic dysfunction (pseudonormalization). Right Ventricle: The right ventricular size is normal. No increase in right ventricular wall thickness. Right ventricular systolic function is normal. Left Atrium: Left atrial size was normal in size. Right Atrium: Right atrial size was normal in size. Pericardium: There is no evidence of pericardial effusion. Mitral Valve: The mitral valve is normal in structure. Mild mitral valve regurgitation. MV peak gradient, 6.0 mmHg. The mean mitral valve gradient is 2.0 mmHg. Tricuspid Valve: The tricuspid valve is normal in structure. Tricuspid valve regurgitation is mild. Aortic Valve: The aortic valve is calcified. Aortic valve regurgitation is mild. Aortic regurgitation PHT measures 582 msec. Mild aortic stenosis  is present. Aortic valve mean gradient measures 6.0 mmHg. Aortic valve peak gradient measures 10.5 mmHg. Aortic valve area, by VTI measures 1.23 cm. Pulmonic Valve: The pulmonic valve was normal in structure. Pulmonic valve regurgitation is not visualized. Aorta: The aortic root and ascending aorta are structurally normal, with no evidence of dilitation. IAS/Shunts: No atrial level shunt detected by color flow Doppler.  LEFT VENTRICLE PLAX 2D LVIDd:         5.20 cm  Diastology LVIDs:         4.20 cm  LV e' medial:    3.26 cm/s LV PW:         1.00 cm  LV E/e' medial:  24.0 LV IVS:        0.80 cm  LV e' lateral:   2.61 cm/s LVOT diam:     1.70 cm  LV E/e' lateral: 30.0 LV SV:         35 LV SV Index:   22 LVOT Area:     2.27 cm  RIGHT VENTRICLE RV Basal diam:  3.10 cm LEFT ATRIUM             Index       RIGHT ATRIUM          Index LA  diam:        3.70 cm 2.32 cm/m  RA Area:     8.70 cm LA Vol (A2C):   30.0 ml 18.85 ml/m RA Volume:   13.00 ml 8.17 ml/m LA Vol (A4C):   48.5 ml 30.47 ml/m LA Biplane Vol: 39.6 ml 24.88 ml/m  AORTIC VALVE                    PULMONIC VALVE AV Area (Vmax):    1.42 cm     PV Vmax:       0.84 m/s AV Area (Vmean):   1.20 cm     PV Vmean:      50.100 cm/s AV Area (VTI):     1.23 cm     PV VTI:        0.128 m AV Vmax:           162.00 cm/s  PV Peak grad:  2.9 mmHg AV Vmean:          116.000 cm/s PV Mean grad:  1.0 mmHg AV VTI:            0.287 m AV Peak Grad:      10.5 mmHg AV Mean Grad:      6.0 mmHg LVOT Vmax:         101.00 cm/s LVOT Vmean:        61.400 cm/s LVOT VTI:          0.155 m LVOT/AV VTI ratio: 0.54 AI PHT:            582 msec  AORTA Ao Root diam: 2.70 cm MITRAL VALVE MV Area (PHT): 4.86 cm     SHUNTS MV Area VTI:   2.03 cm     Systemic VTI:  0.16 m MV Peak grad:  6.0 mmHg     Systemic Diam: 1.70 cm MV Mean grad:  2.0 mmHg MV Vmax:       1.22 m/s MV Vmean:      62.6 cm/s MV Decel Time: 156 msec MV E velocity: 78.40 cm/s MV A velocity: 111.00 cm/s MV E/A ratio:  0.71 Serafina Royals MD Electronically signed by Serafina Royals MD Signature Date/Time:  07/12/2020/5:06:07 PM    Final      Assessment and Recommendation  75 y.o. 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  Continue intravenous Lasix for acute on chronic systolic dysfunction congestive heart failure watching closely for concerns of worsening chronic kidney disease with chronic kidney disease stage III 2.  Reintroduction of carvedilol for hypertension control at 6.25 mg twice per day 3.  Further consideration of addition of hydralazine back to her regimen depending on blood pressure throughout today 4.  No further cardiac diagnostics necessary at this time  Signed, Serafina Royals  M.D. FACC

## 2020-07-13 NOTE — Consult Note (Addendum)
   Heart Failure Nurse Navigator Note'  HFrEF 69-62% Grade 11 diastolic dysfunction.  She presented from home with worsening shortness of breath for several days.  Comorbidities:  Chronic kidney disease stage III Hypertension Hyperlipidemia COPD GERD   Medications:  Clonidine 0.1 mg 2 times a day  hydralazine 100 mg 3 times a day   She is to start carvedilol 6.25 mg 2 times a day and Lasix 40 mg IV daily   Labs:  Sodium 136, potassium 3.6, chloride 103, CO2 23, BUN 32, creatinine 1.74 up from 1.43 of yesterday Reds vest reading was 35% Weight was 68 kg down from 69.4 kg Intake 118 mL Output 500   Assessment:  General-she is awake and alert sitting up in the chair at bedside in no acute distress, currently on O2 at 2 L per nasal cannula.  HEENT-pupils are equal, normocephalic  Cardiac-heart tones are regular rate and rhythm no murmurs rubs or gallops appreciated.  Abdomen soft nontender  Musculoskeletal- there is no lower extremity edema noted  Neurologic- speech is clear, moves all extremities without difficulty.  Psych-is pleasant and appropriate, makes good eye contact.    Initial meeting with patient.  Discussed what heart failure is.  Patient states that she had been taking  her Lasix only taking half of what she should have been taking daily.  Her symptoms had worsened since last Friday to the point that she could no longer lay down.   Discussed daily weights, she states that she does not weigh herself on a daily basis.  Discussed the importance of weighing daily and to be able to catch weight gains to phone physcian with those weight gains.   Discussed diet, she states that she tries to stick with a low sodium and she can, she does rinse canned vegetables does not eat canned soups.  She does admit to once in a while using sea salt.  Discussed the importance of abstaining from salt.  Discussed the amounts of fluids that she drinks daily, and she does  not drink over 64 ounces in a 24-hour period.   Continue to follow along.   Pricilla Riffle RN CHFN

## 2020-07-13 NOTE — Evaluation (Signed)
Physical Therapy Evaluation Patient Details Name: Stacey [name] MRN: [id number] DOB: 1946/01/14 Today's Date: 07/13/2020   History of Present Illness  Patient is a 75 y.o. 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 Found to have Acute respiratory failure with hypoxia due to acute on chronic diastolic CHF and hypertensive emergency and hx of dilated cardiomyopathy, essential hypertension and hypertensive urgency, elevated troponin  Clinical Impression  Patient agreeable to PT evaluation and reports she has not been out of bed since arrival to hospital. No physical assistance required for bed mobility, increased time provided. Patient able to stand and take several steps in the room with Min guard assistance. No significant increased work of breath noted with activity and no chest pain was reported with mobility efforts. Sp02 95% with activity on 2 L 02. Patient was fatigued with activity. Recommend PT follow up to maximize independence. Patient hopeful to discharge home tomorrow.     Follow Up Recommendations Home health PT    Equipment Recommendations   (ongoing assessment, may need rolling walker)    Recommendations for Other Services       Precautions / Restrictions Precautions Precautions: Fall Restrictions Weight Bearing Restrictions: No      Mobility  Bed Mobility Overal bed mobility: Needs Assistance Bed Mobility: Supine to Sit     Supine to sit: Supervision;HOB elevated     General bed mobility comments: extra time required to complete tasks with no physical assistance required    Transfers Overall transfer level: Needs assistance Equipment used: None Transfers: Sit to/from Stand Sit to Stand: Min guard         General transfer comment: Min gaurd for safety. Cues for hand  placement  Ambulation/Gait Ambulation/Gait assistance: Min guard Gait Distance (Feet): 3 Feet Assistive device: None Gait Pattern/deviations: Narrow base of support Gait velocity: decreased   General Gait Details: Patient ambulated a short distance in the room with Min guard for safety. Sp02 94% on 2 L 02 and heart rate 84bpm. Patient with mild fatigue with activity, however no shortness of breath or pain with activity  Stairs            Wheelchair Mobility    Modified Rankin (Stroke Patients Only)       Balance Overall balance assessment: Needs assistance Sitting-balance support: Feet supported Sitting balance-Leahy Scale: Good     Standing balance support: Single extremity supported Standing balance-Leahy Scale: Fair                               Pertinent Vitals/Pain Pain Assessment: No/denies pain    Home Living Family/patient expects to be discharged to:: Private residence Living Arrangements: Spouse/significant other Available Help at Discharge: Family;Available PRN/intermittently (spouse works during the day but is home at night) Type of Home: House Home Access: Stairs to enter Entrance Stairs-Rails: Can reach both Entrance Stairs-Number of Steps: 2-3 Home Layout: One level Stacey [name]: Stacey [name] - single point      Prior Function Level of Independence: Independent with assistive device(s)         Comments: Patient reports intermittently using a cane with ambulation due to sciatic nerve pain. Otherwise, patient is independent with all activity per her report.     Hand Dominance        Extremity/Trunk Assessment   Upper Extremity Assessment  Upper Extremity Assessment: Overall WFL for tasks assessed    Lower Extremity Assessment Lower Extremity Assessment: Generalized weakness (endurance impaired for sustained activity)       Communication      Cognition Arousal/Alertness: Awake/alert Behavior During Therapy: WFL for tasks  assessed/performed Overall Cognitive Status: Within Functional Limits for tasks assessed                                        General Comments      Exercises     Assessment/Plan    PT Assessment Patient needs continued PT services  PT Problem List Decreased strength;Decreased activity tolerance;Decreased balance;Decreased mobility;Decreased safety awareness       PT Treatment Interventions DME instruction;Gait training;Functional mobility training;Therapeutic activities;Stair training;Therapeutic exercise;Balance training    PT Goals (Current goals can be found in the Care Plan section)  Acute Rehab PT Goals Patient Stated Goal: to go home tomorrow PT Goal Formulation: With patient Time For Goal Achievement: 07/27/20 Potential to Achieve Goals: Good    Frequency Min 2X/week   Barriers to discharge        Co-evaluation               AM-PAC PT "6 Clicks" Mobility  Outcome Measure Help needed turning from your back to your side while in a flat bed without using bedrails?: A Little Help needed moving from lying on your back to sitting on the side of a flat bed without using bedrails?: A Little Help needed moving to and from a bed to a chair (including a wheelchair)?: A Little Help needed standing up from a chair using your arms (e.g., wheelchair or bedside chair)?: A Little Help needed to walk in hospital room?: A Little Help needed climbing 3-5 steps with a railing? : A Little 6 Click Score: 18    End of Session Equipment Utilized During Treatment: Oxygen Activity Tolerance: Patient tolerated treatment well Patient left: in bed;with call bell/phone within reach Nurse Communication: Mobility status PT Visit Diagnosis: Unsteadiness on feet (R26.81);Muscle weakness (generalized) (M62.81)    Time: 9379-0240 PT Time Calculation (min) (ACUTE ONLY): 22 min   Charges:   PT Evaluation $PT Eval Moderate Complexity: 1 Mod PT  Treatments $Therapeutic Activity: 8-22 mins        Stacey [name], PT, MPT   Stacey [name] 07/13/2020, 12:25 PM

## 2020-07-14 ENCOUNTER — Encounter: Payer: Self-pay | Admitting: Internal Medicine

## 2020-07-14 DIAGNOSIS — I5043 Acute on chronic combined systolic (congestive) and diastolic (congestive) heart failure: Secondary | ICD-10-CM

## 2020-07-14 LAB — BASIC METABOLIC PANEL
Anion gap: 9 (ref 5–15)
BUN: 36 mg/dL — ABNORMAL HIGH (ref 8–23)
CO2: 25 mmol/L (ref 22–32)
Calcium: 10.7 mg/dL — ABNORMAL HIGH (ref 8.9–10.3)
Chloride: 105 mmol/L (ref 98–111)
Creatinine, Ser: 1.59 mg/dL — ABNORMAL HIGH (ref 0.44–1.00)
GFR, Estimated: 34 mL/min — ABNORMAL LOW (ref >60)
Glucose, Bld: 106 mg/dL — ABNORMAL HIGH (ref 70–99)
Potassium: 4 mmol/L (ref 3.5–5.1)
Sodium: 139 mmol/L (ref 135–145)

## 2020-07-14 LAB — URINALYSIS, ROUTINE W REFLEX MICROSCOPIC
Bilirubin Urine: NEGATIVE
Glucose, UA: NEGATIVE mg/dL
Hgb urine dipstick: NEGATIVE
Ketones, ur: NEGATIVE mg/dL
Leukocytes,Ua: NEGATIVE
Nitrite: NEGATIVE
Protein, ur: 100 mg/dL — AB
Specific Gravity, Urine: 1.009 (ref 1.005–1.030)
Squamous Epithelial / HPF: NONE SEEN (ref 0–5)
pH: 6 (ref 5.0–8.0)

## 2020-07-14 MED ORDER — FLUTICASONE PROPIONATE HFA 110 MCG/ACT IN AERO: 1.0000 | INHALATION_SPRAY | Freq: Two times a day (BID) | RESPIRATORY_TRACT | Status: DC

## 2020-07-14 MED ORDER — CYCLOBENZAPRINE HCL 10 MG PO TABS: 10.0000 mg | ORAL_TABLET | Freq: Three times a day (TID) | ORAL | Status: DC | PRN

## 2020-07-14 MED ORDER — HYDROCODONE-ACETAMINOPHEN 5-325 MG PO TABS: 1.0000 | ORAL_TABLET | Freq: Three times a day (TID) | ORAL | Status: DC | PRN

## 2020-07-14 MED ORDER — MULTIVITAMIN GUMMIES WOMENS PO CHEW: 2.0000 | CHEWABLE_TABLET | Freq: Every day | ORAL | Status: DC

## 2020-07-14 MED ORDER — PRAVASTATIN SODIUM 40 MG PO TABS
40.0000 mg | ORAL_TABLET | Freq: Every day | ORAL | Status: DC
  Administered 2020-07-14: 40 mg via ORAL
  Filled 2020-07-14: qty 1

## 2020-07-14 MED ORDER — ADULT MULTIVITAMIN W/MINERALS CH
1.0000 | ORAL_TABLET | Freq: Every day | ORAL | Status: DC
  Administered 2020-07-15: 1 via ORAL
  Filled 2020-07-14: qty 1

## 2020-07-14 MED ORDER — LEVOTHYROXINE SODIUM 88 MCG PO TABS
88.0000 ug | ORAL_TABLET | Freq: Every day | ORAL | Status: DC
  Administered 2020-07-15: 88 ug via ORAL
  Filled 2020-07-14 (×2): qty 1

## 2020-07-14 MED ORDER — LATANOPROST 0.005 % OP SOLN
1.0000 [drp] | Freq: Every day | OPHTHALMIC | Status: DC
  Administered 2020-07-14: 1 [drp] via OPHTHALMIC
  Filled 2020-07-14: qty 2.5

## 2020-07-14 MED ORDER — CHLORHEXIDINE GLUCONATE CLOTH 2 % EX PADS
6.0000 | MEDICATED_PAD | Freq: Every day | CUTANEOUS | Status: DC
  Administered 2020-07-15: 6 via TOPICAL

## 2020-07-14 MED ORDER — CYCLOSPORINE 0.05 % OP EMUL
1.0000 [drp] | Freq: Every day | OPHTHALMIC | Status: DC
  Filled 2020-07-14: qty 1

## 2020-07-14 MED ORDER — FLUTICASONE PROPIONATE 50 MCG/ACT NA SUSP
2.0000 | Freq: Every day | NASAL | Status: DC
  Filled 2020-07-14: qty 16

## 2020-07-14 MED ORDER — LORATADINE 10 MG PO TABS
10.0000 mg | ORAL_TABLET | Freq: Every day | ORAL | Status: DC
  Administered 2020-07-15: 10 mg via ORAL
  Filled 2020-07-14: qty 1

## 2020-07-14 MED ORDER — PANTOPRAZOLE SODIUM 40 MG PO TBEC: 40.0000 mg | DELAYED_RELEASE_TABLET | Freq: Every day | ORAL | Status: DC | PRN

## 2020-07-14 MED ORDER — TIMOLOL MALEATE 0.25 % OP SOLN
1.0000 [drp] | Freq: Two times a day (BID) | OPHTHALMIC | Status: DC
  Administered 2020-07-14 – 2020-07-15 (×2): 1 [drp] via OPHTHALMIC
  Filled 2020-07-14: qty 5

## 2020-07-14 MED ORDER — MONTELUKAST SODIUM 10 MG PO TABS
10.0000 mg | ORAL_TABLET | Freq: Every day | ORAL | Status: DC
  Administered 2020-07-14: 10 mg via ORAL
  Filled 2020-07-14: qty 1

## 2020-07-14 MED ORDER — BUDESONIDE 0.25 MG/2ML IN SUSP
0.2500 mg | Freq: Two times a day (BID) | RESPIRATORY_TRACT | Status: DC
  Administered 2020-07-14 – 2020-07-15 (×2): 0.25 mg via RESPIRATORY_TRACT
  Filled 2020-07-14 (×2): qty 2

## 2020-07-14 MED ORDER — LOSARTAN POTASSIUM 50 MG PO TABS
100.0000 mg | ORAL_TABLET | Freq: Every day | ORAL | Status: DC
  Administered 2020-07-15: 100 mg via ORAL
  Filled 2020-07-14: qty 2

## 2020-07-14 MED ORDER — CARVEDILOL 3.125 MG PO TABS: 3.1250 mg | ORAL_TABLET | Freq: Two times a day (BID) | ORAL | Status: DC

## 2020-07-14 MED ORDER — GABAPENTIN 100 MG PO CAPS
200.0000 mg | ORAL_CAPSULE | Freq: Every day | ORAL | Status: DC
  Administered 2020-07-14: 200 mg via ORAL
  Filled 2020-07-14: qty 2

## 2020-07-14 NOTE — Progress Notes (Signed)
RN to called to bedside - pt c/o abd felling full again / bladder scan 816mls/ MD made aware/ I/o cath ordered / 1131ml output/ will monitor.

## 2020-07-14 NOTE — Progress Notes (Signed)
Cross-coverage note:   Continues to have urinary retention requiring repeated I&O cath. Plan to place Foley and send urine for UA.

## 2020-07-14 NOTE — Progress Notes (Addendum)
Mobility Specialist - Progress Note   07/14/20 1200  Mobility  Activity Ambulated in room  Level of Assistance Minimal assist, patient does 75% or more  Assistive Device Front wheel walker  Distance Ambulated (ft) 20 ft  Mobility Response Tolerated well  Mobility performed by Mobility specialist  $Mobility charge 1 Mobility    Pre-mobility: 73 HR, 92% SpO2 During mobility: 86 HR, 90% SpO2 Post-mobility: 72 HR, 95% SpO2   Pt ambulated in room with RW. Very slow gait. No LOB. Voiced lightheadedness once seated EOB, resolved with time. O2 desat to 86% on RA with STS transfer, same results when returned supine. PLB engaged to rebound sats to 96% prior to ambulation. O2 >/= 90% during ambulation. Denied SOB. Denied dizziness while ambulating. Denies fatigue, but does report weakness in LE. RPE 4/10 with pain management (abdomen) reported as most difficult part of activity. Left in bed with alarm set.    Kathee Delton Mobility Specialist 07/14/20, 12:29 PM

## 2020-07-14 NOTE — Progress Notes (Signed)
   Heart Failure Nurse Navigator Note  HFrEF 20-25%, mild mitral regurgitation mild aortic stenosis.    Met with patient today, although she was having quite a bit of lower abdominal pain but she wanted to continue with heart failure teaching.  She was able by teach back method to tell me that she should not be using sea salt or salt and the importance of weighing daily and what to report.  I cut the session short because she was in so much discomfort told her we would resume at another time.  She voices understanding.  Pricilla Riffle RN CHFN

## 2020-07-14 NOTE — Progress Notes (Signed)
[facility] Encounter Note  Patient: Stacey [name] / Admit Date: 07/12/2020 / Date of Encounter: 07/14/2020, 5:28 PM   Subjective: 4/26.  Patient feeling much better today with less shortness of breath and not requiring BiPAP at this time.  Oxygenation still requiring some supplement.  Patient's troponin 76/65/97 suggesting demand ischemia rather than acute coronary syndrome.  Telemetry has shown normal sinus rhythm Patient feeling slightly better.  Overall urine output net is 650 mils.  Blood pressure more controlled today than yesterday  4/27.  Patient has had a significant amount of urine output and is much better from the cardiovascular standpoint with less pulmonary edema.  Blood pressure has been significantly elevated due to the fact that the patient is likely feeling better and has also has had significant amount of abdominal bloating and urinary retention.  This is slowly improving at this time.  There is been no evidence of significant other cardiovascular concerns today.  Overall urine output has been greater than 3 L  Echocardiogram showing severe global LV systolic dysfunction with ejection fraction of 25% with some moderate mitral and tricuspid regurgitation  Review of Systems: Positive for: Shortness of breath Negative for: Vision change, hearing change, syncope, dizziness, nausea, vomiting,diarrhea, bloody stool, stomach pain, cough, congestion, diaphoresis, urinary frequency, urinary pain,skin lesions, skin rashes Others previously listed  Objective: Telemetry: Normal sinus rhythm Physical Exam: Blood pressure (!) 185/88, pulse 79, temperature 98 F (36.7 C), temperature source Oral, resp. rate 18, height 4\' 9"  (1.448 m), weight 69.7 kg, SpO2 91 %. Body mass index is 33.26 kg/m. General: Well developed, well nourished, in no acute distress. Head: Normocephalic, atraumatic, sclera non-icteric, no xanthomas, nares are without discharge. Neck: No apparent  masses Lungs: Normal respirations with some wheezes, no rhonchi, few basilar rales , no crackles   Heart: Regular rate and rhythm, normal S1 S2, no murmur, no rub, no gallop, PMI is normal size and placement, carotid upstroke normal without bruit, jugular venous pressure normal Abdomen: Soft, non-tender, non-distended with normoactive bowel sounds. No hepatosplenomegaly. Abdominal aorta is normal size without bruit Extremities: Trace edema, no clubbing, no cyanosis, no ulcers,  Peripheral: 2+ radial, 2+ femoral, 2+ dorsal pedal pulses Neuro: Alert and oriented. Moves all extremities spontaneously. Psych:  Responds to questions appropriately with a normal affect.   Intake/Output Summary (Last 24 hours) at 07/14/2020 1728 Last data filed at 07/14/2020 1440 Gross per 24 hour  Intake 600 ml  Output 3650 ml  Net -3050 ml    Inpatient Medications:  . carvedilol  6.25 mg Oral BID WC  . cloNIDine  0.1 mg Oral BID  . enoxaparin (LOVENOX) injection  30 mg Subcutaneous Q24H  . hydrALAZINE  100 mg Oral TID  . ipratropium-albuterol  3 mL Nebulization Q6H  . sodium chloride flush  3 mL Intravenous Q12H   Infusions:  . sodium chloride      Labs: Recent Labs    07/13/20 0453 07/14/20 0612  NA 136 139  K 3.6 4.0  CL 103 105  CO2 23 25  GLUCOSE 166* 106*  BUN 32* 36*  CREATININE 1.74* 1.59*  CALCIUM 10.3 10.7*  MG 1.7  --    Recent Labs    07/12/20 0623  AST 36  ALT 25  ALKPHOS 73  BILITOT 0.9  PROT 7.9  ALBUMIN 3.7   Recent Labs    07/12/20 0623 07/13/20 0453  WBC 6.9 11.2*  NEUTROABS 4.6  --   HGB 13.1 10.7*  HCT  40.4 32.2*  MCV 86.3 85.9  PLT 241 201   No results for input(s): CKTOTAL, CKMB, TROPONINI in the last 72 hours. Invalid input(s): POCBNP Recent Labs    07/13/20 0453  HGBA1C 6.2*     Weights: Filed Weights   07/12/20 1826 07/13/20 0500 07/14/20 0749  Weight: 69.4 kg 68 kg 69.7 kg     Radiology/Studies:  DG Chest 2 View  Result Date:  07/13/2020 CLINICAL DATA:  Shortness of breath EXAM: CHEST - 2 VIEW COMPARISON:  July 12, 2020 FINDINGS: Patchy airspace opacity noted in each lung base region. Heart is borderline enlarged with pulmonary vascularity normal. No adenopathy. There is aortic atherosclerosis. No bone lesions. IMPRESSION: Probable pneumonia in each lung base. Lungs otherwise clear. Borderline cardiac enlargement. Aortic Atherosclerosis (ICD10-I70.0). Electronically Signed   By: Lowella Grip III M.D.   On: 07/13/2020 15:22   DG Chest Port 1 View  Result Date: 07/12/2020 CLINICAL DATA:  Shortness of breath.  History of COPD. EXAM: PORTABLE CHEST 1 VIEW COMPARISON:  Chest x-ray 01/12/2018, CT chest 04/26/2017, CT chest 10/04/2012, CT chest 04/26/2017 FINDINGS: Redemonstration of a enlarged cardiac silhouette. The heart size and mediastinal contours are unchanged. Aortic arch calcifications. Right middle lobe airspace opacity. Question retrocardiac opacity. Increased interstitial markings. No pleural effusion. No pneumothorax. No acute osseous abnormality. IMPRESSION: 1. Right middle lobe airspace opacities suggestive of infection or inflammation. Also question retrocardiac opacity. Followup PA and lateral chest X-ray is recommended in 3-4 weeks following therapy to ensure resolution and exclude underlying malignancy. 2. Mild pulmonary edema. 3.  Aortic Atherosclerosis (ICD10-I70.0). Electronically Signed   By: Iven Finn M.D.   On: 07/12/2020 06:55   ECHOCARDIOGRAM COMPLETE  Result Date: 07/12/2020    ECHOCARDIOGRAM REPORT   Patient Name:   Stacey [name] Date of Exam: 07/12/2020 Medical Rec #:  [id number]      Height:       57.0 in Accession #:    [id number]     Weight:       150.0 lb Date of Birth:  01/06/1946     BSA:          1.592 m Patient Age:    75 years       BP:           169/107 mmHg Patient Gender: F              HR:           77 bpm. Exam Location:  [facility] Procedure: 2D Echo, Color Doppler, Cardiac Doppler and  Strain Analysis Indications:     I50.31 CHF-Acute Diastolic  History:         Patient has prior history of Echocardiogram examinations. Risk                  Factors:Hypertension.  Sonographer:     Charmayne Sheer RDCS (AE) Referring Phys:  [id number] Soledad Gerlach NIU Diagnosing Phys: Serafina Royals MD  Sonographer Comments: Global longitudinal strain was attempted. IMPRESSIONS  1. Left ventricular ejection fraction, by estimation, is 20 to 25%. The left ventricle has severely decreased function. The left ventricle demonstrates global hypokinesis. The left ventricular internal cavity size was mildly dilated. Left ventricular diastolic parameters are consistent with Grade II diastolic dysfunction (pseudonormalization).  2. Right ventricular systolic function is normal. The right ventricular size is normal.  3. The mitral valve is normal in structure. Mild mitral valve regurgitation.  4. The aortic valve is calcified. Aortic valve regurgitation is mild.  Mild aortic valve stenosis. FINDINGS  Left Ventricle: Left ventricular ejection fraction, by estimation, is 20 to 25%. The left ventricle has severely decreased function. The left ventricle demonstrates global hypokinesis. The left ventricular internal cavity size was mildly dilated. There is no left ventricular hypertrophy. Left ventricular diastolic parameters are consistent with Grade II diastolic dysfunction (pseudonormalization). Right Ventricle: The right ventricular size is normal. No increase in right ventricular wall thickness. Right ventricular systolic function is normal. Left Atrium: Left atrial size was normal in size. Right Atrium: Right atrial size was normal in size. Pericardium: There is no evidence of pericardial effusion. Mitral Valve: The mitral valve is normal in structure. Mild mitral valve regurgitation. MV peak gradient, 6.0 mmHg. The mean mitral valve gradient is 2.0 mmHg. Tricuspid Valve: The tricuspid valve is normal in structure. Tricuspid valve regurgitation  is mild. Aortic Valve: The aortic valve is calcified. Aortic valve regurgitation is mild. Aortic regurgitation PHT measures 582 msec. Mild aortic stenosis is present. Aortic valve mean gradient measures 6.0 mmHg. Aortic valve peak gradient measures 10.5 mmHg. Aortic valve area, by VTI measures 1.23 cm. Pulmonic Valve: The pulmonic valve was normal in structure. Pulmonic valve regurgitation is not visualized. Aorta: The aortic root and ascending aorta are structurally normal, with no evidence of dilitation. IAS/Shunts: No atrial level shunt detected by color flow Doppler.  LEFT VENTRICLE PLAX 2D LVIDd:         5.20 cm  Diastology LVIDs:         4.20 cm  LV e' medial:    3.26 cm/s LV PW:         1.00 cm  LV E/e' medial:  24.0 LV IVS:        0.80 cm  LV e' lateral:   2.61 cm/s LVOT diam:     1.70 cm  LV E/e' lateral: 30.0 LV SV:         35 LV SV Index:   22 LVOT Area:     2.27 cm  RIGHT VENTRICLE RV Basal diam:  3.10 cm LEFT ATRIUM             Index       RIGHT ATRIUM          Index LA diam:        3.70 cm 2.32 cm/m  RA Area:     8.70 cm LA Vol (A2C):   30.0 ml 18.85 ml/m RA Volume:   13.00 ml 8.17 ml/m LA Vol (A4C):   48.5 ml 30.47 ml/m LA Biplane Vol: 39.6 ml 24.88 ml/m  AORTIC VALVE                    PULMONIC VALVE AV Area (Vmax):    1.42 cm     PV Vmax:       0.84 m/s AV Area (Vmean):   1.20 cm     PV Vmean:      50.100 cm/s AV Area (VTI):     1.23 cm     PV VTI:        0.128 m AV Vmax:           162.00 cm/s  PV Peak grad:  2.9 mmHg AV Vmean:          116.000 cm/s PV Mean grad:  1.0 mmHg AV VTI:            0.287 m AV Peak Grad:      10.5 mmHg AV Mean Grad:  6.0 mmHg LVOT Vmax:         101.00 cm/s LVOT Vmean:        61.400 cm/s LVOT VTI:          0.155 m LVOT/AV VTI ratio: 0.54 AI PHT:            582 msec  AORTA Ao Root diam: 2.70 cm MITRAL VALVE MV Area (PHT): 4.86 cm     SHUNTS MV Area VTI:   2.03 cm     Systemic VTI:  0.16 m MV Peak grad:  6.0 mmHg     Systemic Diam: 1.70 cm MV Mean grad:  2.0  mmHg MV Vmax:       1.22 m/s MV Vmean:      62.6 cm/s MV Decel Time: 156 msec MV E velocity: 78.40 cm/s MV A velocity: 111.00 cm/s MV E/A ratio:  0.71 Serafina Royals MD Electronically signed by Serafina Royals MD Signature Date/Time: 07/12/2020/5:06:07 PM    Final      Assessment and Recommendation  75 y.o. 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  Continue intravenous Lasix for acute on chronic systolic dysfunction congestive heart failure watching closely for concerns of worsening chronic kidney disease with chronic kidney disease stage III 2.   Continuation of combination of antihypertensives including carvedilol clonidine and hydralazine at this time 3.  Further investigation and treatment of urinary retention without restriction 4.  No further cardiac diagnostics necessary at this time  Signed, Serafina Royals M.D. FACC

## 2020-07-14 NOTE — Plan of Care (Signed)

## 2020-07-14 NOTE — Progress Notes (Addendum)
Progress Note    Stacey [name]  YQM:[id number] DOB: [birth year]  DOA: 07/12/2020 PCP: Stacey Guise, MD      Brief Narrative:    Medical records reviewed and are as summarized below:  Stacey [name] is a 75 y.o. female       Assessment/Plan:   Principal Problem:   Acute on chronic combined systolic and diastolic CHF (congestive heart failure) ([place]) Active Problems:   Essential hypertension   Dilated cardiomyopathy ([place])   Hypercalcemia   Hyperparathyroidism, primary ([place])   Mixed hyperlipidemia   Gastroesophageal reflux disease without esophagitis   Acute respiratory failure with hypoxia ([place])   CKD (chronic kidney disease), stage IIIa   Chronic obstructive pulmonary disease ([place])   Hypertensive emergency   Elevated troponin   Hypothyroidism  Body mass index is 33.26 kg/m.  (Obesity)  Acute on chronic systolic and diastolic CHF: Continue IV Lasix but discontinue later today.  Monitor BMP, urine output and daily weight. 2D echo showed EF of 20 to 52%, grade 2 diastolic dysfunction, mild aortic valve stenosis.  Acute urinary retention: She required in and out urethral catheterization x2.  Continue to monitor.  Elevated troponin: This is likely due to demand ischemia  Hypertension: Continue antihypertensive.  CKD stage IIIb: Creatinine appears to be around baseline.  No AKI.  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 Yes; Fluid consistency: Thin  Diet effective now                    Consultants:  Cardiologist  Procedures:  None    Medications:   . carvedilol  6.25 mg Oral BID WC  . cloNIDine  0.1 mg Oral BID  . enoxaparin (LOVENOX) injection  30 mg Subcutaneous Q24H  . hydrALAZINE  100 mg Oral TID  . ipratropium-albuterol  3 mL Nebulization Q6H  . sodium  chloride flush  3 mL Intravenous Q12H   Continuous Infusions: . sodium chloride       Anti-infectives (From admission, onward)   None             Family Communication/Anticipated D/C date and plan/Code Status   DVT prophylaxis: enoxaparin (LOVENOX) injection 30 mg Start: 07/12/20 2200     Code Status: Full Code  Family Communication: None Disposition Plan:    Status is: Inpatient  Remains inpatient appropriate because:IV treatments appropriate due to intensity of illness or inability to take PO and Inpatient level of care appropriate due to severity of illness   Dispo: The patient is from: Home              Anticipated d/c is to: Home              Patient currently is not medically stable to d/c.   Difficult to place patient No           Subjective:   Interval events noted.  She had severe lower abdominal pain earlier this morning.  Bladder scan showed urinary retention.  In and out urethral catheterization was done and 1,200 mls of urine was aspirated.  She retained urine again later in the day and repeat in and out cath yielded 1,100 mls of urine.  No shortness of breath or chest pain.  Objective:    Vitals:   07/14/20 0749  07/14/20 0756 07/14/20 1150 07/14/20 1543  BP: (!) 172/81  (!) 158/83 (!) 185/88  Pulse: 70  72 79  Resp: 18   18  Temp: 97.8 F (36.6 C)  97.7 F (36.5 C) 98 F (36.7 C)  TempSrc:    Oral  SpO2: 98% 98% 96% 91%  Weight: 69.7 kg     Height:       No data found.   Intake/Output Summary (Last 24 hours) at 07/14/2020 1712 Last data filed at 07/14/2020 1440 Gross per 24 hour  Intake 600 ml  Output 3650 ml  Net -3050 ml   Filed Weights   07/12/20 1826 07/13/20 0500 07/14/20 0749  Weight: 69.4 kg 68 kg 69.7 kg    Exam:  GEN: NAD SKIN: No rash EYES: EOMI ENT: MMM CV: RRR, systolic murmur the aortic area PULM: CTA B ABD: soft, ND, NT, +BS CNS: AAO x 3, non focal EXT: Trace bilateral leg edema, no  tenderness        Data Reviewed:   I have personally reviewed following labs and imaging studies:  Labs: Labs show the following:   Basic Metabolic Panel: Recent Labs  Lab 07/12/20 0623 07/13/20 0453 07/14/20 0612  NA 138 136 139  K 4.3 3.6 4.0  CL 107 103 105  CO2 24 23 25   GLUCOSE 187* 166* 106*  BUN 23 32* 36*  CREATININE 1.43* 1.74* 1.59*  CALCIUM 10.4* 10.3 10.7*  MG  --  1.7  --    GFR Estimated Creatinine Clearance: 25.7 mL/min (A) (by C-G formula based on SCr of 1.59 mg/dL (H)). Liver Function Tests: Recent Labs  Lab 07/12/20 0623  AST 36  ALT 25  ALKPHOS 73  BILITOT 0.9  PROT 7.9  ALBUMIN 3.7   No results for input(s): LIPASE, AMYLASE in the last 168 hours. No results for input(s): AMMONIA in the last 168 hours. Coagulation profile No results for input(s): INR, PROTIME in the last 168 hours.  CBC: Recent Labs  Lab 07/12/20 0623 07/13/20 0453  WBC 6.9 11.2*  NEUTROABS 4.6  --   HGB 13.1 10.7*  HCT 40.4 32.2*  MCV 86.3 85.9  PLT 241 201   Cardiac Enzymes: No results for input(s): CKTOTAL, CKMB, CKMBINDEX, TROPONINI in the last 168 hours. BNP (last 3 results) No results for input(s): PROBNP in the last 8760 hours. CBG: No results for input(s): GLUCAP in the last 168 hours. D-Dimer: No results for input(s): DDIMER in the last 72 hours. Hgb A1c: Recent Labs    07/13/20 0453  HGBA1C 6.2*   Lipid Profile: Recent Labs    07/13/20 0453  CHOL 143  HDL 49  LDLCALC 83  TRIG 57  CHOLHDL 2.9   Thyroid function studies: No results for input(s): TSH, T4TOTAL, T3FREE, THYROIDAB in the last 72 hours.  Invalid input(s): FREET3 Anemia work up: No results for input(s): VITAMINB12, FOLATE, FERRITIN, TIBC, IRON, RETICCTPCT in the last 72 hours. Sepsis Labs: Recent Labs  Lab 07/12/20 0623 07/13/20 0453  PROCALCITON  --  0.29  WBC 6.9 11.2*    Microbiology Recent Results (from the past 240 hour(s))  Resp Panel by RT-PCR (Flu A&B,  Covid) Nasopharyngeal Swab     Status: None   Collection Time: 07/12/20  6:23 AM   Specimen: Nasopharyngeal Swab; Nasopharyngeal(NP) swabs in vial transport medium  Result Value Ref Range Status   SARS Coronavirus 2 by RT PCR NEGATIVE NEGATIVE Final    Comment: (NOTE) SARS-CoV-2 target nucleic acids are  NOT DETECTED.  The SARS-CoV-2 RNA is generally detectable in upper respiratory specimens during the acute phase of infection. The lowest concentration of SARS-CoV-2 viral copies this assay can detect is 138 copies/mL. A negative result does not preclude SARS-Cov-2 infection and should not be used as the sole basis for treatment or other patient management decisions. A negative result may occur with  improper specimen collection/handling, submission of specimen other than nasopharyngeal swab, presence of viral mutation(s) within the areas targeted by this assay, and inadequate number of viral copies(<138 copies/mL). A negative result must be combined with clinical observations, patient history, and epidemiological information. The expected result is Negative.  Fact Sheet for Patients:  EntrepreneurPulse.com.au  Fact Sheet for Healthcare Providers:  IncredibleEmployment.be  This test is no t yet approved or cleared by the [place] FDA and  has been authorized for detection and/or diagnosis of SARS-CoV-2 by FDA under an Emergency Use Authorization (EUA). This EUA will remain  in effect (meaning this test can be used) for the duration of the COVID-19 declaration under Section 564(b)(1) of the Act, 21 U.S.C.section 360bbb-3(b)(1), unless the authorization is terminated  or revoked sooner.       Influenza A by PCR NEGATIVE NEGATIVE Final   Influenza B by PCR NEGATIVE NEGATIVE Final    Comment: (NOTE) The Xpert Xpress SARS-CoV-2/FLU/RSV plus assay is intended as an aid in the diagnosis of influenza from Nasopharyngeal swab specimens and should  not be used as a sole basis for treatment. Nasal washings and aspirates are unacceptable for Xpert Xpress SARS-CoV-2/FLU/RSV testing.  Fact Sheet for Patients: EntrepreneurPulse.com.au  Fact Sheet for Healthcare Providers: IncredibleEmployment.be  This test is not yet approved or cleared by the [place] FDA and has been authorized for detection and/or diagnosis of SARS-CoV-2 by FDA under an Emergency Use Authorization (EUA). This EUA will remain in effect (meaning this test can be used) for the duration of the COVID-19 declaration under Section 564(b)(1) of the Act, 21 U.S.C. section 360bbb-3(b)(1), unless the authorization is terminated or revoked.  Performed at [facility], [address] [place], [place] [address]     Procedures and diagnostic studies:  DG Chest 2 View  Result Date: 07/13/2020 CLINICAL DATA:  Shortness of breath EXAM: CHEST - 2 VIEW COMPARISON:  July 12, 2020 FINDINGS: Patchy airspace opacity noted in each lung base region. Heart is borderline enlarged with pulmonary vascularity normal. No adenopathy. There is aortic atherosclerosis. No bone lesions. IMPRESSION: Probable pneumonia in each lung base. Lungs otherwise clear. Borderline cardiac enlargement. Aortic Atherosclerosis (ICD10-I70.0). Electronically Signed   By: Lowella Grip III M.D.   On: 07/13/2020 15:22               LOS: 2 days   Jhania Etherington  [facility]   Pager on [email or web address]. If 7PM-7AM, please contact night-coverage at [email or web address]     07/14/2020, 5:12 PM

## 2020-07-14 NOTE — Progress Notes (Signed)
Pt c/o RLQ and LLQ abd pain / tender to touch/ bladder scan + for urinary retention / MD made aware/ order to I/o cath / 1223mls output/ pt reports relief/ will monitor.

## 2020-07-15 LAB — BASIC METABOLIC PANEL
Anion gap: 9 (ref 5–15)
BUN: 32 mg/dL — ABNORMAL HIGH (ref 8–23)
CO2: 26 mmol/L (ref 22–32)
Calcium: 10.6 mg/dL — ABNORMAL HIGH (ref 8.9–10.3)
Chloride: 104 mmol/L (ref 98–111)
Creatinine, Ser: 1.37 mg/dL — ABNORMAL HIGH (ref 0.44–1.00)
GFR, Estimated: 41 mL/min — ABNORMAL LOW (ref >60)
Glucose, Bld: 130 mg/dL — ABNORMAL HIGH (ref 70–99)
Potassium: 3.6 mmol/L (ref 3.5–5.1)
Sodium: 139 mmol/L (ref 135–145)

## 2020-07-15 LAB — CBC WITH DIFFERENTIAL/PLATELET
Abs Immature Granulocytes: 0.01 10*3/uL (ref 0.00–0.07)
Basophils Absolute: 0 10*3/uL (ref 0.0–0.1)
Basophils Relative: 1 %
Eosinophils Absolute: 0.2 10*3/uL (ref 0.0–0.5)
Eosinophils Relative: 2 %
HCT: 37.6 % (ref 36.0–46.0)
Hemoglobin: 12.2 g/dL (ref 12.0–15.0)
Immature Granulocytes: 0 %
Lymphocytes Relative: 18 %
Lymphs Abs: 1.2 10*3/uL (ref 0.7–4.0)
MCH: 28 pg (ref 26.0–34.0)
MCHC: 32.4 g/dL (ref 30.0–36.0)
MCV: 86.2 fL (ref 80.0–100.0)
Monocytes Absolute: 0.9 10*3/uL (ref 0.1–1.0)
Monocytes Relative: 14 %
Neutro Abs: 4.1 10*3/uL (ref 1.7–7.7)
Neutrophils Relative %: 65 %
Platelets: 210 10*3/uL (ref 150–400)
RBC: 4.36 MIL/uL (ref 3.87–5.11)
RDW: 14.7 % (ref 11.5–15.5)
WBC: 6.3 10*3/uL (ref 4.0–10.5)
nRBC: 0 % (ref 0.0–0.2)

## 2020-07-15 MED ORDER — FUROSEMIDE 40 MG PO TABS
40.0000 mg | ORAL_TABLET | Freq: Every day | ORAL | Status: DC
  Administered 2020-07-15: 40 mg via ORAL
  Filled 2020-07-15: qty 1

## 2020-07-15 MED ORDER — CARVEDILOL 6.25 MG PO TABS: 6.2500 mg | ORAL_TABLET | Freq: Two times a day (BID) | ORAL | 0 refills | Status: AC

## 2020-07-15 MED ORDER — FUROSEMIDE 20 MG PO TABS: 40.0000 mg | ORAL_TABLET | Freq: Every day | ORAL | 0 refills | Status: DC

## 2020-07-15 MED ORDER — IPRATROPIUM-ALBUTEROL 0.5-2.5 (3) MG/3ML IN SOLN: 3.0000 mL | Freq: Three times a day (TID) | RESPIRATORY_TRACT | Status: DC

## 2020-07-15 MED ORDER — GABAPENTIN 100 MG PO CAPS: 200.0000 mg | ORAL_CAPSULE | Freq: Every day | ORAL | Status: DC

## 2020-07-15 MED ORDER — FLOVENT HFA 110 MCG/ACT IN AERO: 1.0000 | INHALATION_SPRAY | Freq: Two times a day (BID) | RESPIRATORY_TRACT | Status: DC

## 2020-07-15 NOTE — Discharge Summary (Addendum)
Physician Discharge Summary  DACEY BEERMAN [id number] DOB: 05-02-1945 DOA: 07/12/2020  PCP: [name], MD  Admit date: 07/12/2020 Discharge date: 07/15/2020  Discharge disposition: Home   Recommendations for Outpatient Follow-Up:   Follow-up with PCP in 1 week Follow-up with [facility] in 1 week Follow-up with cardiologist in 1 week   Discharge Diagnosis:   Principal Problem:   Acute on chronic combined systolic and diastolic CHF (congestive heart failure) ([place]) Active Problems:   Essential hypertension   Dilated cardiomyopathy ([place])   Hypercalcemia   Hyperparathyroidism, primary ([place])   Mixed hyperlipidemia   Gastroesophageal reflux disease without esophagitis   Acute respiratory failure with hypoxia ([place])   CKD (chronic kidney disease), stage IIIa   Chronic obstructive pulmonary disease ([place])   Hypertensive emergency   Elevated troponin   Hypothyroidism    Discharge Condition: Stable.  Diet recommendation:  Diet Order            Diet - low sodium heart healthy           Diet 2 gram sodium Room service appropriate? Yes; Fluid consistency: Thin  Diet effective now                   Code Status: Full Code     Hospital Course:   Ms. 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 She presented to the hospital with progressively worsening shortness of breath.  She was hypoxic with oxygen saturation of 86% on room.  She was also severely hypertensive with blood pressure of 245/151.  She was admitted to the progressive cardiac unit for acute exacerbation of chronic systolic and diastolic CHF, acute hypoxic respiratory failure and  hypertensive emergency.  She was treated with IV Lasix, multiple antihypertensives and IV nitroglycerin infusion.  Initially, she required BiPAP for acute respiratory failure.  2D echo showed EF estimated at 20 to [id number], grade 2 diastolic dysfunction and mild aortic stenosis.  Elevated troponins were attributed to demand ischemia.  Her condition improved and she was weaned off of oxygen therapy.  She developed acute urinary retention requiring multiple in and out urethral catheterization.  She failed voiding trial so she was discharged on Foley catheter.  Outpatient follow-up with urologist was recommended for voiding trial.    Medical Consultants:    Cardiologist   Discharge Exam:    Vitals:   07/15/20 0311 07/15/20 0457 07/15/20 1203 07/15/20 1607  BP: (!) 123/58  (!) 152/83 (!) 178/95  Pulse: 69  88 75  Resp: 19  18 16   Temp: 98.7 F (37.1 C)  98.2 F (36.8 C) 98.3 F (36.8 C)  TempSrc: Oral  Oral Oral  SpO2: 92%  94% 99%  Weight:  67.2 kg    Height:         GEN: NAD SKIN: Warm and dry EYES: EOMI ENT: MMM CV: RRR PULM: CTA B ABD: soft, obese, NT, +BS CNS: AAO x 3, non focal EXT: No edema or tenderness   The results of significant diagnostics from this hospitalization (including imaging, microbiology, ancillary and laboratory) are listed below for reference.     Procedures and Diagnostic Studies:   DG Chest 2 View  Result Date: 07/13/2020 CLINICAL DATA:  Shortness of breath EXAM: CHEST - 2 VIEW  COMPARISON:  July 12, 2020 FINDINGS: Patchy airspace opacity noted in each lung base region. Heart is borderline enlarged with pulmonary vascularity normal. No adenopathy. There is aortic atherosclerosis. No bone lesions. IMPRESSION: Probable pneumonia in each lung base. Lungs otherwise clear. Borderline cardiac enlargement. Aortic Atherosclerosis (ICD10-I70.0). Electronically Signed   By: Bretta BangWilliam  Woodruff III M.D.   On: 07/13/2020 15:22   DG Chest Port 1 View  Result Date:  07/12/2020 CLINICAL DATA:  Shortness of breath.  History of COPD. EXAM: PORTABLE CHEST 1 VIEW COMPARISON:  Chest x-ray 01/12/2018, CT chest 04/26/2017, CT chest 10/04/2012, CT chest 04/26/2017 FINDINGS: Redemonstration of a enlarged cardiac silhouette. The heart size and mediastinal contours are unchanged. Aortic arch calcifications. Right middle lobe airspace opacity. Question retrocardiac opacity. Increased interstitial markings. No pleural effusion. No pneumothorax. No acute osseous abnormality. IMPRESSION: 1. Right middle lobe airspace opacities suggestive of infection or inflammation. Also question retrocardiac opacity. Followup PA and lateral chest X-ray is recommended in 3-4 weeks following therapy to ensure resolution and exclude underlying malignancy. 2. Mild pulmonary edema. 3.  Aortic Atherosclerosis (ICD10-I70.0). Electronically Signed   By: Tish FredericksonMorgane  Naveau M.D.   On: 07/12/2020 06:55   ECHOCARDIOGRAM COMPLETE  Result Date: 07/12/2020    ECHOCARDIOGRAM REPORT   Patient Name:   Maryann ConnersLAINE S MOORE Date of Exam: 07/12/2020 Medical Rec #:  [id number]      Height:       57.0 in Accession #:    [id number]     Weight:       150.0 lb Date of Birth:  08-30-1945     BSA:          1.592 m Patient Age:    74 years       BP:           169/107 mmHg Patient Gender: F              HR:           77 bpm. Exam Location:  [facility] Procedure: 2D Echo, Color Doppler, Cardiac Doppler and Strain Analysis Indications:     I50.31 CHF-Acute Diastolic  History:         Patient has prior history of Echocardiogram examinations. Risk                  Factors:Hypertension.  Sonographer:     Humphrey RollsJoan Heiss RDCS (AE) Referring Phys:  [id number] Brien FewXILIN NIU Diagnosing Phys: Arnoldo HookerBruce Kowalski MD  Sonographer Comments: Global longitudinal strain was attempted. IMPRESSIONS  1. Left ventricular ejection fraction, by estimation, is 20 to 25%. The left ventricle has severely decreased function. The left ventricle demonstrates global hypokinesis. The left  ventricular internal cavity size was mildly dilated. Left ventricular diastolic parameters are consistent with Grade II diastolic dysfunction (pseudonormalization).  2. Right ventricular systolic function is normal. The right ventricular size is normal.  3. The mitral valve is normal in structure. Mild mitral valve regurgitation.  4. The aortic valve is calcified. Aortic valve regurgitation is mild. Mild aortic valve stenosis. FINDINGS  Left Ventricle: Left ventricular ejection fraction, by estimation, is 20 to 25%. The left ventricle has severely decreased function. The left ventricle demonstrates global hypokinesis. The left ventricular internal cavity size was mildly dilated. There is no left ventricular hypertrophy. Left ventricular diastolic parameters are consistent with Grade II diastolic dysfunction (pseudonormalization). Right Ventricle: The right ventricular size is normal. No increase in right ventricular wall thickness. Right ventricular systolic function is normal. Left Atrium: Left atrial size was  normal in size. Right Atrium: Right atrial size was normal in size. Pericardium: There is no evidence of pericardial effusion. Mitral Valve: The mitral valve is normal in structure. Mild mitral valve regurgitation. MV peak gradient, 6.0 mmHg. The mean mitral valve gradient is 2.0 mmHg. Tricuspid Valve: The tricuspid valve is normal in structure. Tricuspid valve regurgitation is mild. Aortic Valve: The aortic valve is calcified. Aortic valve regurgitation is mild. Aortic regurgitation PHT measures 582 msec. Mild aortic stenosis is present. Aortic valve mean gradient measures 6.0 mmHg. Aortic valve peak gradient measures 10.5 mmHg. Aortic valve area, by VTI measures 1.23 cm. Pulmonic Valve: The pulmonic valve was normal in structure. Pulmonic valve regurgitation is not visualized. Aorta: The aortic root and ascending aorta are structurally normal, with no evidence of dilitation. IAS/Shunts: No atrial level  shunt detected by color flow Doppler.  LEFT VENTRICLE PLAX 2D LVIDd:         5.20 cm  Diastology LVIDs:         4.20 cm  LV e' medial:    3.26 cm/s LV PW:         1.00 cm  LV E/e' medial:  24.0 LV IVS:        0.80 cm  LV e' lateral:   2.61 cm/s LVOT diam:     1.70 cm  LV E/e' lateral: 30.0 LV SV:         35 LV SV Index:   22 LVOT Area:     2.27 cm  RIGHT VENTRICLE RV Basal diam:  3.10 cm LEFT ATRIUM             Index       RIGHT ATRIUM          Index LA diam:        3.70 cm 2.32 cm/m  RA Area:     8.70 cm LA Vol (A2C):   30.0 ml 18.85 ml/m RA Volume:   13.00 ml 8.17 ml/m LA Vol (A4C):   48.5 ml 30.47 ml/m LA Biplane Vol: 39.6 ml 24.88 ml/m  AORTIC VALVE                    PULMONIC VALVE AV Area (Vmax):    1.42 cm     PV Vmax:       0.84 m/s AV Area (Vmean):   1.20 cm     PV Vmean:      50.100 cm/s AV Area (VTI):     1.23 cm     PV VTI:        0.128 m AV Vmax:           162.00 cm/s  PV Peak grad:  2.9 mmHg AV Vmean:          116.000 cm/s PV Mean grad:  1.0 mmHg AV VTI:            0.287 m AV Peak Grad:      10.5 mmHg AV Mean Grad:      6.0 mmHg LVOT Vmax:         101.00 cm/s LVOT Vmean:        61.400 cm/s LVOT VTI:          0.155 m LVOT/AV VTI ratio: 0.54 AI PHT:            582 msec  AORTA Ao Root diam: 2.70 cm MITRAL VALVE MV Area (PHT): 4.86 cm     SHUNTS MV Area VTI:  2.03 cm     Systemic VTI:  0.16 m MV Peak grad:  6.0 mmHg     Systemic Diam: 1.70 cm MV Mean grad:  2.0 mmHg MV Vmax:       1.22 m/s MV Vmean:      62.6 cm/s MV Decel Time: 156 msec MV E velocity: 78.40 cm/s MV A velocity: 111.00 cm/s MV E/A ratio:  0.71 Serafina Royals MD Electronically signed by Serafina Royals MD Signature Date/Time: 07/12/2020/5:06:07 PM    Final      Labs:   Basic Metabolic Panel: Recent Labs  Lab 07/12/20 [id number] 07/13/20 0453 07/14/20 0612 07/15/20 0555  NA 138 136 139 139  K 4.3 3.6 4.0 3.6  CL 107 103 105 104  CO2 24 23 25 26   GLUCOSE 187* 166* 106* 130*  BUN 23 32* 36* 32*  CREATININE 1.43* 1.74*  1.59* 1.37*  CALCIUM 10.4* 10.3 10.7* 10.6*  MG  --  1.7  --   --    GFR Estimated Creatinine Clearance: 29.3 mL/min (A) (by C-G formula based on SCr of 1.37 mg/dL (H)). Liver Function Tests: Recent Labs  Lab 07/12/20 0623  AST 36  ALT 25  ALKPHOS 73  BILITOT 0.9  PROT 7.9  ALBUMIN 3.7   No results for input(s): LIPASE, AMYLASE in the last 168 hours. No results for input(s): AMMONIA in the last 168 hours. Coagulation profile No results for input(s): INR, PROTIME in the last 168 hours.  CBC: Recent Labs  Lab 07/12/20 0623 07/13/20 0453 07/15/20 0555  WBC 6.9 11.2* 6.3  NEUTROABS 4.6  --  4.1  HGB 13.1 10.7* 12.2  HCT 40.4 32.2* 37.6  MCV 86.3 85.9 86.2  PLT 241 201 210   Cardiac Enzymes: No results for input(s): CKTOTAL, CKMB, CKMBINDEX, TROPONINI in the last 168 hours. BNP: Invalid input(s): POCBNP CBG: No results for input(s): GLUCAP in the last 168 hours. D-Dimer No results for input(s): DDIMER in the last 72 hours. Hgb A1c Recent Labs    07/13/20 0453  HGBA1C 6.2*   Lipid Profile Recent Labs    07/13/20 0453  CHOL 143  HDL 49  LDLCALC 83  TRIG 57  CHOLHDL 2.9   Thyroid function studies No results for input(s): TSH, T4TOTAL, T3FREE, THYROIDAB in the last 72 hours.  Invalid input(s): FREET3 Anemia work up No results for input(s): VITAMINB12, FOLATE, FERRITIN, TIBC, IRON, RETICCTPCT in the last 72 hours. Microbiology Recent Results (from the past 240 hour(s))  Resp Panel by RT-PCR (Flu A&B, Covid) Nasopharyngeal Swab     Status: None   Collection Time: 07/12/20  6:23 AM   Specimen: Nasopharyngeal Swab; Nasopharyngeal(NP) swabs in vial transport medium  Result Value Ref Range Status   SARS Coronavirus 2 by RT PCR NEGATIVE NEGATIVE Final    Comment: (NOTE) SARS-CoV-2 target nucleic acids are NOT DETECTED.  The SARS-CoV-2 RNA is generally detectable in upper respiratory specimens during the acute phase of infection. The lowest concentration of  SARS-CoV-2 viral copies this assay can detect is 138 copies/mL. A negative result does not preclude SARS-Cov-2 infection and should not be used as the sole basis for treatment or other patient management decisions. A negative result may occur with  improper specimen collection/handling, submission of specimen other than nasopharyngeal swab, presence of viral mutation(s) within the areas targeted by this assay, and inadequate number of viral copies(<138 copies/mL). A negative result must be combined with clinical observations, patient history, and epidemiological information. The expected result is Negative.  Fact Sheet for Patients:  EntrepreneurPulse.com.au  Fact Sheet for Healthcare Providers:  IncredibleEmployment.be  This test is no t yet approved or cleared by the [place] FDA and  has been authorized for detection and/or diagnosis of SARS-CoV-2 by FDA under an Emergency Use Authorization (EUA). This EUA will remain  in effect (meaning this test can be used) for the duration of the COVID-19 declaration under Section 564(b)(1) of the Act, 21 U.S.C.section 360bbb-3(b)(1), unless the authorization is terminated  or revoked sooner.       Influenza A by PCR NEGATIVE NEGATIVE Final   Influenza B by PCR NEGATIVE NEGATIVE Final    Comment: (NOTE) The Xpert Xpress SARS-CoV-2/FLU/RSV plus assay is intended as an aid in the diagnosis of influenza from Nasopharyngeal swab specimens and should not be used as a sole basis for treatment. Nasal washings and aspirates are unacceptable for Xpert Xpress SARS-CoV-2/FLU/RSV testing.  Fact Sheet for Patients: EntrepreneurPulse.com.au  Fact Sheet for Healthcare Providers: IncredibleEmployment.be  This test is not yet approved or cleared by the [place] FDA and has been authorized for detection and/or diagnosis of SARS-CoV-2 by FDA under an Emergency Use  Authorization (EUA). This EUA will remain in effect (meaning this test can be used) for the duration of the COVID-19 declaration under Section 564(b)(1) of the Act, 21 U.S.C. section 360bbb-3(b)(1), unless the authorization is terminated or revoked.  Performed at [facility], [address] [place], [place] [address]      Discharge Instructions:   Discharge Instructions    Diet - low sodium heart healthy   Complete by: As directed    Face-to-face encounter (required for Medicare/Medicaid patients)   Complete by: As directed    I [place] certify that this patient is under my care and that I, or a nurse practitioner or physician's assistant working with me, had a face-to-face encounter that meets the physician face-to-face encounter requirements with this patient on 07/15/2020. The encounter with the patient was in whole, or in part for the following medical condition(s) which is the primary reason for home health care (List medical condition): CHF, debility   The encounter with the patient was in whole, or in part, for the following medical condition, which is the primary reason for home health care: CHF, debility   I certify that, based on my findings, the following services are medically necessary home health services: Physical therapy   Reason for Medically Necessary Home Health Services: Therapy- Personnel officer, Public librarian   My clinical findings support the need for the above services: Shortness of breath with activity   Further, I certify that my clinical findings support that this patient is homebound due to: Shortness of Breath with activity   Home Health   Complete by: As directed    To provide the following care/treatments: PT   Increase activity slowly   Complete by: As directed      Allergies as of 07/15/2020      Reactions   Codeine Other (See Comments)   Other reaction(s): GI Intolerance Other Reaction: nausea   chest  pain Nausea Other reaction(s): GI Intolerance, Other (See Comments) Nausea GI Intolerance, nausea, chest pain   Propoxyphene Other (See Comments)   Other Reaction: nausea and chest pain   Alendronate    Other reaction(s): Other (See Comments) Chest pain   Ciprofloxacin Rash   Lisinopril    Other reaction(s): Cough   Amlodipine Swelling   Aspirin    Other reaction(s):  Other (See Comments) Other reaction(s): Other (See Comments) Stomach hurt   Methotrexate Hives   Tramadol Nausea And Vomiting   Vicodin [hydrocodone-acetaminophen] Nausea And Vomiting      Medication List    STOP taking these medications   colchicine 0.6 MG tablet   ketorolac 10 MG tablet Commonly known as: TORADOL   naproxen 500 MG tablet Commonly known as: Naprosyn     TAKE these medications   albuterol 108 (90 Base) MCG/ACT inhaler Commonly known as: Ventolin HFA Inhale 2 puffs into the lungs every 6 (six) hours as needed for wheezing or shortness of breath.   carvedilol 6.25 MG tablet Commonly known as: COREG Take 1 tablet (6.25 mg total) by mouth 2 (two) times daily with a meal. What changed: how much to take   cetirizine 10 MG tablet Commonly known as: ZYRTEC Take 1 tablet (10 mg total) by mouth daily.   clobetasol ointment 0.05 % Commonly known as: TEMOVATE Apply 1 application topically 2 (two) times daily as needed (rash).   cloNIDine 0.1 MG tablet Commonly known as: CATAPRES Take 1 tablet (0.1 mg total) by mouth 2 (two) times daily.   cyclobenzaprine 10 MG tablet Commonly known as: FLEXERIL Take 1 tablet (10 mg total) by mouth 3 (three) times daily as needed.   Flovent HFA 110 MCG/ACT inhaler Generic drug: fluticasone Inhale 1 puff into the lungs 2 (two) times daily.   fluticasone 50 MCG/ACT nasal spray Commonly known as: FLONASE Place 2 sprays into both nostrils daily.   furosemide 20 MG tablet Commonly known as: LASIX Take 2 tablets (40 mg total) by mouth daily. What  changed:   how much to take  additional instructions   gabapentin 100 MG capsule Commonly known as: NEURONTIN Take 2 capsules (200 mg total) by mouth at bedtime.   hydrALAZINE 50 MG tablet Commonly known as: APRESOLINE Take 2 tablets (100 mg total) by mouth 3 (three) times daily.   HYDROcodone-acetaminophen 5-325 MG tablet Commonly known as: Norco Take 1 tablet by mouth 3 (three) times daily as needed.   latanoprost 0.005 % ophthalmic solution Commonly known as: XALATAN Place 1 drop into both eyes at bedtime.   levothyroxine 88 MCG tablet Commonly known as: SYNTHROID Take 88 mcg by mouth daily before breakfast.   losartan 100 MG tablet Commonly known as: COZAAR Take 1 tablet (100 mg total) by mouth daily.   montelukast 10 MG tablet Commonly known as: SINGULAIR Take 1 tablet (10 mg total) by mouth daily.   Multivitamin Gummies [employer] 2 tablets by mouth daily.   ondansetron 4 MG disintegrating tablet Commonly known as: Zofran ODT Take 1 tablet (4 mg total) by mouth every 8 (eight) hours as needed.   pantoprazole 40 MG tablet Commonly known as: PROTONIX TAKE 1 TABLET BY MOUTH AS NEEDED   pravastatin 20 MG tablet Commonly known as: PRAVACHOL TAKE 1 TABLET(20 MG) BY MOUTH EVERY NIGHT What changed:   how much to take  how to take this  when to take this  additional instructions   Restasis 0.05 % ophthalmic emulsion Generic drug: cycloSPORINE Place 1 drop into both eyes daily.   timolol 0.25 % ophthalmic solution Commonly known as: TIMOPTIC Place 1 drop into both eyes 2 (two) times daily.       Follow-up Information    Corey Skains, MD Follow up in 1 week(s).   Specialty: Cardiology Contact information: [address] [facility] [place] [address] [phone]  Time coordinating discharge: 33 minutes  Signed:  Montina Dorrance  [facility] 07/15/2020, 5:04 PM   Pager  on [email or web address]. If 7PM-7AM, please contact night-coverage at [email or web address]

## 2020-07-15 NOTE — Progress Notes (Signed)
   Heart Failure Nurse Navigator Note  HFrEF 20-25%   Met with patient today, she states that she is not having discomfort that she had yesterday with the urinary retention as she still has Foley catheter in.   Patient is getting ready for discharge and discussed medications and the importance of taking them as ordered, she realizes that she had not been taking the furosemide correctly prior to admission.  Also discussed eating in restaurants that she states that she and her husband go out to eat on Friday nights for "their date night."  Referred to the information and the living with heart failure teaching booklet, and discussed various allergies when eating at a restaurant.  Again stressed the importance of not adding salt and how the salt/sodium with fluids affects her body.  By teach back method discussed signs and symptoms to report along with daily weights.  She understands the importance of recording.  Also discussed the importance of remaining active, and to rest when she is tired.  She voices interest in wanting to do water aerobics and losing weight.  Spent 30 minutes with the patient as she is nearing the time to be discharged.  Also informed her that I would be calling her in the next few days to see how she is doing.  Pricilla Riffle RN [facility]

## 2020-07-15 NOTE — Progress Notes (Addendum)
Removed foley at 1448, due to void by 2048. Patient educated.   1830: Patient had urge to urinate, sat on commode for about 15 mins; unsuccessful. Bladder scan volume displayed > 443. Dr. Mal Misty updated.

## 2020-07-15 NOTE — Progress Notes (Signed)
[facility] Encounter Note  Patient: JONIKA CRITZ / Admit Date: 07/12/2020 / Date of Encounter: 07/15/2020, 7:21 AM   Subjective: 4/26.  Patient feeling much better today with less shortness of breath and not requiring BiPAP at this time.  Oxygenation still requiring some supplement.  Patient's troponin 76/65/97 suggesting demand ischemia rather than acute coronary syndrome.  Telemetry has shown normal sinus rhythm Patient feeling slightly better.  Overall urine output net is 650 mils.  Blood pressure more controlled today than yesterday  4/27.  Patient has had a significant amount of urine output and is much better from the cardiovascular standpoint with less pulmonary edema.  Blood pressure has been significantly elevated due to the fact that the patient is likely feeling better and has also has had significant amount of abdominal bloating and urinary retention.  This is slowly improving at this time.  There is been no evidence of significant other cardiovascular concerns today.  Overall urine output has been greater than 3 L  4/28.  Patient has had significant improvements from the cardiovascular standpoint with less shortness of breath lower extremity edema pulmonary edema and hypoxia.  She has reached her maximal medication management for this at this time with much better blood pressure control as well.  She still does have some urinary retention for which is a slight concern and may needs further attention but from the cardiovascular standpoint is stable  Echocardiogram showing severe global LV systolic dysfunction with ejection fraction of 25% with some moderate mitral and tricuspid regurgitation  Review of Systems: Positive for: Urinary retention Negative for: Vision change, hearing change, syncope, dizziness, nausea, vomiting,diarrhea, bloody stool, stomach pain, cough, congestion, diaphoresis, urinary frequency, urinary pain,skin lesions, skin rashes Others  previously listed  Objective: Telemetry: Normal sinus rhythm Physical Exam: Blood pressure (!) 123/58, pulse 69, temperature 98.7 F (37.1 C), temperature source Oral, resp. rate 19, height 4\' 9"  (1.448 m), weight 67.2 kg, SpO2 92 %. Body mass index is 32.06 kg/m. General: Well developed, well nourished, in no acute distress. Head: Normocephalic, atraumatic, sclera non-icteric, no xanthomas, nares are without discharge. Neck: No apparent masses Lungs: Normal respirations with some wheezes, no rhonchi, few basilar rales , no crackles   Heart: Regular rate and rhythm, normal S1 S2, no murmur, no rub, no gallop, PMI is normal size and placement, carotid upstroke normal without bruit, jugular venous pressure normal Abdomen: Soft, non-tender, non-distended with normoactive bowel sounds. No hepatosplenomegaly. Abdominal aorta is normal size without bruit Extremities: Trace edema, no clubbing, no cyanosis, no ulcers,  Peripheral: 2+ radial, 2+ femoral, 2+ dorsal pedal pulses Neuro: Alert and oriented. Moves all extremities spontaneously. Psych:  Responds to questions appropriately with a normal affect.   Intake/Output Summary (Last 24 hours) at 07/15/2020 0721 Last data filed at 07/15/2020 0500 Gross per 24 hour  Intake 483 ml  Output 3900 ml  Net -3417 ml    Inpatient Medications:  . budesonide (PULMICORT) nebulizer solution  0.25 mg Nebulization BID  . carvedilol  6.25 mg Oral BID WC  . Chlorhexidine Gluconate Cloth  6 each Topical Daily  . cloNIDine  0.1 mg Oral BID  . cycloSPORINE  1 drop Both Eyes Daily  . enoxaparin (LOVENOX) injection  30 mg Subcutaneous Q24H  . fluticasone  2 spray Each Nare Daily  . gabapentin  200 mg Oral QHS  . hydrALAZINE  100 mg Oral TID  . ipratropium-albuterol  3 mL Nebulization Q6H  . latanoprost  1 drop Both  Eyes QHS  . levothyroxine  88 mcg Oral QAC breakfast  . loratadine  10 mg Oral Daily  . losartan  100 mg Oral Daily  . montelukast  10 mg Oral  QHS  . multivitamin with minerals  1 tablet Oral Daily  . pravastatin  40 mg Oral QHS  . sodium chloride flush  3 mL Intravenous Q12H  . timolol  1 drop Both Eyes BID   Infusions:  . sodium chloride      Labs: Recent Labs    07/13/20 0453 07/14/20 0612 07/15/20 0555  NA 136 139 139  K 3.6 4.0 3.6  CL 103 105 104  CO2 23 25 26   GLUCOSE 166* 106* 130*  BUN 32* 36* 32*  CREATININE 1.74* 1.59* 1.37*  CALCIUM 10.3 10.7* 10.6*  MG 1.7  --   --    No results for input(s): AST, ALT, ALKPHOS, BILITOT, PROT, ALBUMIN in the last 72 hours. Recent Labs    07/13/20 0453 07/15/20 0555  WBC 11.2* 6.3  NEUTROABS  --  4.1  HGB 10.7* 12.2  HCT 32.2* 37.6  MCV 85.9 86.2  PLT 201 210   No results for input(s): CKTOTAL, CKMB, TROPONINI in the last 72 hours. Invalid input(s): POCBNP Recent Labs    07/13/20 0453  HGBA1C 6.2*     Weights: Filed Weights   07/13/20 0500 07/14/20 0749 07/15/20 0457  Weight: 68 kg 69.7 kg 67.2 kg     Radiology/Studies:  DG Chest 2 View  Result Date: 07/13/2020 CLINICAL DATA:  Shortness of breath EXAM: CHEST - 2 VIEW COMPARISON:  July 12, 2020 FINDINGS: Patchy airspace opacity noted in each lung base region. Heart is borderline enlarged with pulmonary vascularity normal. No adenopathy. There is aortic atherosclerosis. No bone lesions. IMPRESSION: Probable pneumonia in each lung base. Lungs otherwise clear. Borderline cardiac enlargement. Aortic Atherosclerosis (ICD10-I70.0). Electronically Signed   By: Lowella Grip III M.D.   On: 07/13/2020 15:22   DG Chest Port 1 View  Result Date: 07/12/2020 CLINICAL DATA:  Shortness of breath.  History of COPD. EXAM: PORTABLE CHEST 1 VIEW COMPARISON:  Chest x-ray 01/12/2018, CT chest 04/26/2017, CT chest 10/04/2012, CT chest 04/26/2017 FINDINGS: Redemonstration of a enlarged cardiac silhouette. The heart size and mediastinal contours are unchanged. Aortic arch calcifications. Right middle lobe airspace opacity.  Question retrocardiac opacity. Increased interstitial markings. No pleural effusion. No pneumothorax. No acute osseous abnormality. IMPRESSION: 1. Right middle lobe airspace opacities suggestive of infection or inflammation. Also question retrocardiac opacity. Followup PA and lateral chest X-ray is recommended in 3-4 weeks following therapy to ensure resolution and exclude underlying malignancy. 2. Mild pulmonary edema. 3.  Aortic Atherosclerosis (ICD10-I70.0). Electronically Signed   By: Iven Finn M.D.   On: 07/12/2020 06:55   ECHOCARDIOGRAM COMPLETE  Result Date: 07/12/2020    ECHOCARDIOGRAM REPORT   Patient Name:   LAMEEKA SCHLEIFER Date of Exam: 07/12/2020 Medical Rec #:  [id number]      Height:       57.0 in Accession #:    [id number]     Weight:       150.0 lb Date of Birth:  [birth year]     BSA:          1.592 m Patient Age:    56 years       BP:           169/107 mmHg Patient Gender: F  HR:           77 bpm. Exam Location:  [facility] Procedure: 2D Echo, Color Doppler, Cardiac Doppler and Strain Analysis Indications:     I50.31 CHF-Acute Diastolic  History:         Patient has prior history of Echocardiogram examinations. Risk                  Factors:Hypertension.  Sonographer:     Charmayne Sheer RDCS (AE) Referring Phys:  [id number] Soledad Gerlach NIU Diagnosing Phys: Serafina Royals MD  Sonographer Comments: Global longitudinal strain was attempted. IMPRESSIONS  1. Left ventricular ejection fraction, by estimation, is 20 to 25%. The left ventricle has severely decreased function. The left ventricle demonstrates global hypokinesis. The left ventricular internal cavity size was mildly dilated. Left ventricular diastolic parameters are consistent with Grade II diastolic dysfunction (pseudonormalization).  2. Right ventricular systolic function is normal. The right ventricular size is normal.  3. The mitral valve is normal in structure. Mild mitral valve regurgitation.  4. The aortic valve is calcified. Aortic valve  regurgitation is mild. Mild aortic valve stenosis. FINDINGS  Left Ventricle: Left ventricular ejection fraction, by estimation, is 20 to 25%. The left ventricle has severely decreased function. The left ventricle demonstrates global hypokinesis. The left ventricular internal cavity size was mildly dilated. There is no left ventricular hypertrophy. Left ventricular diastolic parameters are consistent with Grade II diastolic dysfunction (pseudonormalization). Right Ventricle: The right ventricular size is normal. No increase in right ventricular wall thickness. Right ventricular systolic function is normal. Left Atrium: Left atrial size was normal in size. Right Atrium: Right atrial size was normal in size. Pericardium: There is no evidence of pericardial effusion. Mitral Valve: The mitral valve is normal in structure. Mild mitral valve regurgitation. MV peak gradient, 6.0 mmHg. The mean mitral valve gradient is 2.0 mmHg. Tricuspid Valve: The tricuspid valve is normal in structure. Tricuspid valve regurgitation is mild. Aortic Valve: The aortic valve is calcified. Aortic valve regurgitation is mild. Aortic regurgitation PHT measures 582 msec. Mild aortic stenosis is present. Aortic valve mean gradient measures 6.0 mmHg. Aortic valve peak gradient measures 10.5 mmHg. Aortic valve area, by VTI measures 1.23 cm. Pulmonic Valve: The pulmonic valve was normal in structure. Pulmonic valve regurgitation is not visualized. Aorta: The aortic root and ascending aorta are structurally normal, with no evidence of dilitation. IAS/Shunts: No atrial level shunt detected by color flow Doppler.  LEFT VENTRICLE PLAX 2D LVIDd:         5.20 cm  Diastology LVIDs:         4.20 cm  LV e' medial:    3.26 cm/s LV PW:         1.00 cm  LV E/e' medial:  24.0 LV IVS:        0.80 cm  LV e' lateral:   2.61 cm/s LVOT diam:     1.70 cm  LV E/e' lateral: 30.0 LV SV:         35 LV SV Index:   22 LVOT Area:     2.27 cm  RIGHT VENTRICLE RV Basal diam:   3.10 cm LEFT ATRIUM             Index       RIGHT ATRIUM          Index LA diam:        3.70 cm 2.32 cm/m  RA Area:     8.70 cm LA Vol (A2C):  30.0 ml 18.85 ml/m RA Volume:   13.00 ml 8.17 ml/m LA Vol (A4C):   48.5 ml 30.47 ml/m LA Biplane Vol: 39.6 ml 24.88 ml/m  AORTIC VALVE                    PULMONIC VALVE AV Area (Vmax):    1.42 cm     PV Vmax:       0.84 m/s AV Area (Vmean):   1.20 cm     PV Vmean:      50.100 cm/s AV Area (VTI):     1.23 cm     PV VTI:        0.128 m AV Vmax:           162.00 cm/s  PV Peak grad:  2.9 mmHg AV Vmean:          116.000 cm/s PV Mean grad:  1.0 mmHg AV VTI:            0.287 m AV Peak Grad:      10.5 mmHg AV Mean Grad:      6.0 mmHg LVOT Vmax:         101.00 cm/s LVOT Vmean:        61.400 cm/s LVOT VTI:          0.155 m LVOT/AV VTI ratio: 0.54 AI PHT:            582 msec  AORTA Ao Root diam: 2.70 cm MITRAL VALVE MV Area (PHT): 4.86 cm     SHUNTS MV Area VTI:   2.03 cm     Systemic VTI:  0.16 m MV Peak grad:  6.0 mmHg     Systemic Diam: 1.70 cm MV Mean grad:  2.0 mmHg MV Vmax:       1.22 m/s MV Vmean:      62.6 cm/s MV Decel Time: 156 msec MV E velocity: 78.40 cm/s MV A velocity: 111.00 cm/s MV E/A ratio:  0.71 Serafina Royals MD Electronically signed by Serafina Royals MD Signature Date/Time: 07/12/2020/5:06:07 PM    Final      Assessment and Recommendation  75 y.o. 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  Change Lasix to 40 mg orally p.o. daily for congestive heart failure  2.   Continuation of combination of antihypertensives including carvedilol clonidine and hydralazine at this time 3.  Further investigation and treatment of urinary retention without restriction 4.  No further cardiac diagnostics necessary at this time 5.  Okay for discharge to home from a cardiovascular standpoint if ambulating well  with no further significant symptoms  Signed, Serafina Royals M.D. FACC

## 2020-07-15 NOTE — Progress Notes (Signed)
Mobility Specialist - Progress Note   07/15/20 1300  Mobility  Activity Ambulated in room  Level of Assistance Standby assist, set-up cues, supervision of patient - no hands on  Assistive Device Front wheel walker  Distance Ambulated (ft) 50 ft  Mobility Response Tolerated well  Mobility performed by Mobility specialist  $Mobility charge 1 Mobility    Pre-mobility: 87 HR, 93% SpO2 During mobility: 95 HR, 92% SpO2 Post-mobility: 93 HR, 93% SpO2   Pt ambulated in room with RW. No LOB. Supervision. Pt reports this is the best she's felt since admission. Denies pain/discomfort in abdomen. Denied SOB on RA. O2 >/= 92% throughout session. No desat with transfers this date. RPE 1/10. Pt left in bed with alarm set.    Kathee Delton Mobility Specialist 07/15/20, 1:22 PM

## 2020-07-15 NOTE — Progress Notes (Signed)
AVS reviewed with patient. Foley inserted, patient advised to call for urology appointment per order. PIVs and tele removed. Leg bags provided.

## 2020-07-15 NOTE — TOC Initial Note (Signed)
Transition of Care [facility]) - Initial/Assessment Note    Patient Details  Name: Stacey [name] MRN: [id number] Date of Birth: 10/29/1945  [facility]) CM/SW Contact:    Kerin Salen, RN Phone Number: 07/15/2020, 12:03 PM  Clinical Narrative:   Spoke with patient who says she lives in a single family home with significant other, Rayvon Pevehouse. Son and grandson also lives close and helps when needed with shopping. Patient states due to multiple eye surgeries she is unable to drive.  Uses [facility] in [place] on [address], medications picked up by significant other and or Son. Patient agrees to having [place], no agency preference. [facility] will provide [place]. No other TOC needs assessed at this time.               Expected Discharge Plan: [place] Barriers to Discharge: Continued Medical Work up   Patient Goals and CMS Choice Patient states their goals for this hospitalization and ongoing recovery are:: To return home.   Choice offered to / list presented to : Patient  Expected Discharge Plan and Services Expected Discharge Plan: [place]       Living arrangements for the past 2 months: Single Family Home                 DME Arranged: N/A DME Agency: NA       HH Arranged: PT,OT,RN [place] Agency: [place] ([place]) Date [place]: 07/15/20 Time [place]: 1100 Representative spoke with at [place]: Floydene Flock  Prior Living Arrangements/Services Living arrangements for the past 2 months: [place] with:: Significant Other Patient language and need for interpreter reviewed:: Yes Do you feel safe going back to the place where you live?: Yes      Need for Family Participation in Patient Care: No (Comment) Care giver support system in place?: Yes (comment)   Criminal Activity/Legal Involvement Pertinent to Current Situation/Hospitalization:  No - Comment as needed  Activities of Daily Living Home Assistive Devices/Equipment: Cane (specify quad or straight) ADL Screening (condition at time of admission) Patient's cognitive ability adequate to safely complete daily activities?: Yes Is the patient deaf or have difficulty hearing?: No Does the patient have difficulty seeing, even when wearing glasses/contacts?: No Does the patient have difficulty concentrating, remembering, or making decisions?: No Patient able to express need for assistance with ADLs?: Yes Does the patient have difficulty dressing or bathing?: No Independently performs ADLs?: Yes (appropriate for developmental age) Does the patient have difficulty walking or climbing stairs?: Yes Weakness of Legs: Both Weakness of Arms/Hands: None  Permission Sought/Granted Permission sought to share information with : Case Manager Permission granted to share information with : Yes, Verbal Permission Granted              Emotional Assessment Appearance:: Appears stated age Attitude/Demeanor/Rapport: Self-Confident,Engaged Affect (typically observed): Accepting Orientation: : Oriented to Self,Oriented to Place,Oriented to  Time,Oriented to Situation Alcohol / Substance Use: Not Applicable Psych Involvement: No (comment)  Admission diagnosis:  Hypoxia [R09.02] COPD exacerbation (HCC) [J44.1] Acute on chronic diastolic CHF (congestive heart failure) (HCC) [I50.33] Acute on chronic congestive heart failure, unspecified heart failure type [facility]) [I50.9] Patient Active Problem List   Diagnosis Date Noted  . Acute on chronic combined systolic and diastolic CHF (congestive heart failure) ([place]) 07/12/2020  . Hypertensive emergency 07/12/2020  . Elevated troponin 07/12/2020  . Hypothyroidism 07/12/2020  .  Neuralgia and neuritis 03/14/2020  . Primary generalized (osteo)arthritis 11/30/2019  . Encounter for screening mammogram for malignant neoplasm of breast 11/30/2019  .  Vasomotor rhinitis 06/28/2019  . Atopic dermatitis 06/28/2019  . Localized superficial swelling, mass, or lump 06/28/2019  . Plantar neuroma of left foot 06/28/2019  . Chronic obstructive pulmonary disease ([place]) 11/03/2018  . Right flank pain 11/03/2018  . Dysuria 11/03/2018  . Acute upper respiratory infection 05/06/2018  . Acquired hypothyroidism 03/18/2018  . CKD (chronic kidney disease), stage IIIa 03/18/2018  . ARF (acute renal failure) ([place]) 01/14/2018  . Hypertensive crisis 01/14/2018  . Acute respiratory failure with hypoxia ([place]) 01/13/2018  . Encounter for general adult medical examination with abnormal findings 10/26/2017  . Allergic rhinitis 10/26/2017  . Vitamin D deficiency 10/26/2017  . Gastroesophageal reflux disease without esophagitis 10/26/2017  . Essential hypertension 04/16/2017  . Thyroid disease 04/16/2017  . 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 Dilated cardiomyopathy ([place]) 06/27/2016  . Bilateral carotid artery stenosis 05/30/2016  . SOBOE (shortness of breath on exertion) 05/30/2016  . Hyperparathyroidism, primary ([place]) 08/25/2015  . Hypercalcemia 08/12/2014  . LVH (left ventricular hypertrophy) due to hypertensive disease, without heart failure 07/09/2014  . Moderate mitral insufficiency 07/09/2014  . Benign essential hypertension 07/06/2014  . 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 Mixed hyperlipidemia 01/08/2014  . Chronic pelvic pain in female 05/31/2011  . Fibroids 05/31/2011  . Sarcoidosis 05/31/2011   PCP:  [name], MD Pharmacy:   [facility] [place], [place] [address] [place] [address] Phone: [phone] Fax: [phone]  [facility] [place], [place] AT Portal to Registered [employer] Sites [place] [address] Phone: [phone] Fax:  [phone]     Social Determinants of Health (SDOH) Interventions    Readmission Risk Interventions No flowsheet data found.

## 2020-07-15 NOTE — Plan of Care (Signed)
Problem: Education: Goal: Knowledge of General Education information will improve Description: Including pain rating scale, medication(s)/side effects and non-pharmacologic comfort measures 07/15/2020 1857 by Rometta Emery, RN Outcome: Adequate for Discharge 07/15/2020 1857 by Rometta Emery, RN Outcome: Adequate for Discharge   Problem: Health Behavior/Discharge Planning: Goal: Ability to manage health-related needs will improve 07/15/2020 1857 by Rometta Emery, RN Outcome: Adequate for Discharge 07/15/2020 1857 by Rometta Emery, RN Outcome: Adequate for Discharge   Problem: Clinical Measurements: Goal: Ability to maintain clinical measurements within normal limits will improve 07/15/2020 1857 by Rometta Emery, RN Outcome: Adequate for Discharge 07/15/2020 1857 by Rometta Emery, RN Outcome: Adequate for Discharge Goal: Will remain free from infection 07/15/2020 1857 by Rometta Emery, RN Outcome: Adequate for Discharge 07/15/2020 1857 by Rometta Emery, RN Outcome: Adequate for Discharge Goal: Diagnostic test results will improve 07/15/2020 1857 by Rometta Emery, RN Outcome: Adequate for Discharge 07/15/2020 1857 by Rometta Emery, RN Outcome: Adequate for Discharge Goal: Respiratory complications will improve 07/15/2020 1857 by Rometta Emery, RN Outcome: Adequate for Discharge 07/15/2020 1857 by Rometta Emery, RN Outcome: Adequate for Discharge Goal: Cardiovascular complication will be avoided 07/15/2020 1857 by Rometta Emery, RN Outcome: Adequate for Discharge 07/15/2020 1857 by Rometta Emery, RN Outcome: Adequate for Discharge   Problem: Activity: Goal: Risk for activity intolerance will decrease 07/15/2020 1857 by Rometta Emery, RN Outcome: Adequate for Discharge 07/15/2020 1857 by Rometta Emery, RN Outcome: Adequate for Discharge   Problem: Nutrition: Goal: Adequate nutrition will be maintained 07/15/2020 1857 by  Rometta Emery, RN Outcome: Adequate for Discharge 07/15/2020 1857 by Rometta Emery, RN Outcome: Adequate for Discharge   Problem: Coping: Goal: Level of anxiety will decrease 07/15/2020 1857 by Rometta Emery, RN Outcome: Adequate for Discharge 07/15/2020 1857 by Rometta Emery, RN Outcome: Adequate for Discharge   Problem: Elimination: Goal: Will not experience complications related to bowel motility 07/15/2020 1857 by Rometta Emery, RN Outcome: Adequate for Discharge 07/15/2020 1857 by Rometta Emery, RN Outcome: Adequate for Discharge Goal: Will not experience complications related to urinary retention 07/15/2020 1857 by Rometta Emery, RN Outcome: Adequate for Discharge 07/15/2020 1857 by Rometta Emery, RN Outcome: Adequate for Discharge   Problem: Pain Managment: Goal: General experience of comfort will improve 07/15/2020 1857 by Rometta Emery, RN Outcome: Adequate for Discharge 07/15/2020 1857 by Rometta Emery, RN Outcome: Adequate for Discharge   Problem: Safety: Goal: Ability to remain free from injury will improve 07/15/2020 1857 by Rometta Emery, RN Outcome: Adequate for Discharge 07/15/2020 1857 by Rometta Emery, RN Outcome: Adequate for Discharge   Problem: Skin Integrity: Goal: Risk for impaired skin integrity will decrease 07/15/2020 1857 by Rometta Emery, RN Outcome: Adequate for Discharge 07/15/2020 1857 by Rometta Emery, RN Outcome: Adequate for Discharge   Problem: Education: Goal: Ability to demonstrate management of disease process will improve 07/15/2020 1857 by Rometta Emery, RN Outcome: Adequate for Discharge 07/15/2020 1857 by Rometta Emery, RN Outcome: Adequate for Discharge Goal: Ability to verbalize understanding of medication therapies will improve 07/15/2020 1857 by Rometta Emery, RN Outcome: Adequate for Discharge 07/15/2020 1857 by Rometta Emery, RN Outcome: Adequate for  Discharge Goal: Individualized Educational Video(s) 07/15/2020 1857 by Rometta Emery, RN Outcome: Adequate for Discharge 07/15/2020 1857 by Rometta Emery, RN Outcome: Adequate for Discharge   Problem: Activity: Goal: Capacity to carry out activities will improve 07/15/2020 1857 by Rometta Emery, RN Outcome: Adequate for Discharge 07/15/2020 1857 by Rometta Emery, RN Outcome: Adequate for Discharge   Problem: Cardiac: Goal: Ability to achieve and maintain adequate cardiopulmonary perfusion  will improve 07/15/2020 1857 by Rometta Emery, RN Outcome: Adequate for Discharge 07/15/2020 1857 by Rometta Emery, RN Outcome: Adequate for Discharge

## 2020-07-15 NOTE — Care Management Important Message (Signed)
Important Message  Patient Details  Name: Stacey [name] MRN: [id number] Date of Birth: 31-Jul-1945   Medicare Important Message Given:  Yes     [name] 07/15/2020, 1:01 PM

## 2020-07-15 NOTE — Progress Notes (Signed)
Physical Therapy Treatment Patient Details Name: ADAM DEMARY MRN: [id number] DOB: 1945/04/16 Today's Date: 07/15/2020    History of Present Illness Patient is a 75 y.o. 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 Found to have acute respiratory failure with hypoxia due to acute on chronic diastolic CHF and hypertensive emergency and hx of dilated cardiomyopathy, essential hypertension and hypertensive urgency, elevated troponin    PT Comments    Patient is making good progress towards functional independence. Patient with increased activity tolerance this session, progressing to walking 152ft in hallway with rolling walker on room air with Sp02 94% with Min guard assistance. Occasional cues for proper use of rolling walker. Patient educated on energy conservation. Patient is hopeful to discharge home tomorrow with support of family. Recommend to continue PT to maximize independence and facilitate return to prior level of function.      Follow Up Recommendations  Home health PT     Equipment Recommendations  Rolling walker with 5" wheels    Recommendations for Other Services       Precautions / Restrictions Precautions Precautions: Fall Restrictions Weight Bearing Restrictions: No    Mobility  Bed Mobility Overal bed mobility: Needs Assistance Bed Mobility: Supine to Sit;Sit to Supine     Supine to sit: Modified independent (Device/Increase time);HOB elevated Sit to supine: Min guard   General bed mobility comments: Min guard with return to bed. verbal cues for technique. extra time required to complete tasks    Transfers Overall transfer level: Needs assistance Equipment used: Rolling walker (2 wheeled) Transfers: Sit to/from Stand Sit to Stand: Supervision         General transfer comment:  supervision for safety  Ambulation/Gait Ambulation/Gait assistance: Min guard Gait Distance (Feet): 110 Feet Assistive device: Rolling walker (2 wheeled) Gait Pattern/deviations: Step-through pattern;Decreased stride length Gait velocity: decreased   General Gait Details: patient ambulated in hallway without oxygen with Sp02 94% on room air. No significant shortness of breath noted with activity. verbal cues for proper rolling walker placement.   Stairs             Wheelchair Mobility    Modified Rankin (Stroke Patients Only)       Balance                                            Cognition Arousal/Alertness: Awake/alert Behavior During Therapy: WFL for tasks assessed/performed Overall Cognitive Status: Within Functional Limits for tasks assessed                                        Exercises      General Comments        Pertinent Vitals/Pain Pain Assessment: No/denies pain    Home Living                      Prior Function            PT Goals (current goals can now be found in the care plan section) Acute Rehab PT Goals Patient Stated Goal: to go home PT Goal Formulation: With patient Time For Goal Achievement: 07/27/20 Potential to Achieve Goals:  Good Progress towards PT goals: Progressing toward goals    Frequency    Min 2X/week      PT Plan Current plan remains appropriate    Co-evaluation              AM-PAC PT "6 Clicks" Mobility   Outcome Measure  Help needed turning from your back to your side while in a flat bed without using bedrails?: None Help needed moving from lying on your back to sitting on the side of a flat bed without using bedrails?: A Little Help needed moving to and from a bed to a chair (including a wheelchair)?: A Little Help needed standing up from a chair using your arms (e.g., wheelchair or bedside chair)?: A Little Help needed to walk in hospital room?: A  Little Help needed climbing 3-5 steps with a railing? : A Little 6 Click Score: 19    End of Session   Activity Tolerance: Patient tolerated treatment well Patient left: in bed;with call bell/phone within reach   PT Visit Diagnosis: Unsteadiness on feet (R26.81);Muscle weakness (generalized) (M62.81)     Time: 6333-5456 PT Time Calculation (min) (ACUTE ONLY): 27 min  Charges:  $Therapeutic Activity: 23-37 mins                     Minna Merritts, PT, MPT    Percell Locus 07/15/2020, 3:02 PM

## 2020-07-16 ENCOUNTER — Telehealth: Payer: Self-pay

## 2020-07-16 NOTE — Telephone Encounter (Signed)
Tiffany from [facility] called [phone]) requesting PT to start on Monday 07/19/20.  Order was approved

## 2020-07-17 DIAGNOSIS — I5043 Acute on chronic combined systolic (congestive) and diastolic (congestive) heart failure: Secondary | ICD-10-CM | POA: Diagnosis not present

## 2020-07-17 DIAGNOSIS — D869 Sarcoidosis, unspecified: Secondary | ICD-10-CM | POA: Diagnosis not present

## 2020-07-17 DIAGNOSIS — E210 Primary hyperparathyroidism: Secondary | ICD-10-CM | POA: Diagnosis not present

## 2020-07-17 DIAGNOSIS — J449 Chronic obstructive pulmonary disease, unspecified: Secondary | ICD-10-CM | POA: Diagnosis not present

## 2020-07-17 DIAGNOSIS — M25562 Pain in left knee: Secondary | ICD-10-CM | POA: Diagnosis not present

## 2020-07-17 DIAGNOSIS — Z466 Encounter for fitting and adjustment of urinary device: Secondary | ICD-10-CM | POA: Diagnosis not present

## 2020-07-17 DIAGNOSIS — K2100 Gastro-esophageal reflux disease with esophagitis, without bleeding: Secondary | ICD-10-CM | POA: Diagnosis not present

## 2020-07-17 DIAGNOSIS — E782 Mixed hyperlipidemia: Secondary | ICD-10-CM | POA: Diagnosis not present

## 2020-07-17 DIAGNOSIS — Z9049 Acquired absence of other specified parts of digestive tract: Secondary | ICD-10-CM | POA: Diagnosis not present

## 2020-07-17 DIAGNOSIS — N136 Pyonephrosis: Secondary | ICD-10-CM | POA: Diagnosis not present

## 2020-07-17 DIAGNOSIS — I130 Hypertensive heart and chronic kidney disease with heart failure and stage 1 through stage 4 chronic kidney disease, or unspecified chronic kidney disease: Secondary | ICD-10-CM | POA: Diagnosis not present

## 2020-07-17 DIAGNOSIS — E890 Postprocedural hypothyroidism: Secondary | ICD-10-CM | POA: Diagnosis not present

## 2020-07-17 DIAGNOSIS — Z6833 Body mass index (BMI) 33.0-33.9, adult: Secondary | ICD-10-CM | POA: Diagnosis not present

## 2020-07-17 DIAGNOSIS — I083 Combined rheumatic disorders of mitral, aortic and tricuspid valves: Secondary | ICD-10-CM | POA: Diagnosis not present

## 2020-07-17 DIAGNOSIS — Z9181 History of falling: Secondary | ICD-10-CM | POA: Diagnosis not present

## 2020-07-17 DIAGNOSIS — T83511D Infection and inflammatory reaction due to indwelling urethral catheter, subsequent encounter: Secondary | ICD-10-CM | POA: Diagnosis not present

## 2020-07-17 DIAGNOSIS — E663 Overweight: Secondary | ICD-10-CM | POA: Diagnosis not present

## 2020-07-17 DIAGNOSIS — I6529 Occlusion and stenosis of unspecified carotid artery: Secondary | ICD-10-CM | POA: Diagnosis not present

## 2020-07-17 DIAGNOSIS — G629 Polyneuropathy, unspecified: Secondary | ICD-10-CM | POA: Diagnosis not present

## 2020-07-17 DIAGNOSIS — I350 Nonrheumatic aortic (valve) stenosis: Secondary | ICD-10-CM | POA: Diagnosis not present

## 2020-07-17 DIAGNOSIS — K862 Cyst of pancreas: Secondary | ICD-10-CM | POA: Diagnosis not present

## 2020-07-17 DIAGNOSIS — B9689 Other specified bacterial agents as the cause of diseases classified elsewhere: Secondary | ICD-10-CM | POA: Diagnosis not present

## 2020-07-17 DIAGNOSIS — Q6100 Congenital renal cyst, unspecified: Secondary | ICD-10-CM | POA: Diagnosis not present

## 2020-07-17 DIAGNOSIS — I420 Dilated cardiomyopathy: Secondary | ICD-10-CM | POA: Diagnosis not present

## 2020-07-17 DIAGNOSIS — R339 Retention of urine, unspecified: Secondary | ICD-10-CM | POA: Diagnosis not present

## 2020-07-17 DIAGNOSIS — Z79899 Other long term (current) drug therapy: Secondary | ICD-10-CM | POA: Diagnosis not present

## 2020-07-17 DIAGNOSIS — N1832 Chronic kidney disease, stage 3b: Secondary | ICD-10-CM | POA: Diagnosis not present

## 2020-07-17 DIAGNOSIS — M1990 Unspecified osteoarthritis, unspecified site: Secondary | ICD-10-CM | POA: Diagnosis not present

## 2020-07-17 DIAGNOSIS — D8685 Sarcoid myocarditis: Secondary | ICD-10-CM | POA: Diagnosis not present

## 2020-07-17 DIAGNOSIS — N179 Acute kidney failure, unspecified: Secondary | ICD-10-CM | POA: Diagnosis not present

## 2020-07-18 ENCOUNTER — Emergency Department: Payer: Medicare Other

## 2020-07-18 ENCOUNTER — Other Ambulatory Visit: Payer: Self-pay

## 2020-07-18 ENCOUNTER — Inpatient Hospital Stay
Admission: EM | Admit: 2020-07-18 | Discharge: 2020-07-22 | DRG: 699 | Disposition: A | Discharge status: Home Health | Payer: Medicare Other | Attending: Internal Medicine | Admitting: Internal Medicine

## 2020-07-18 DIAGNOSIS — N390 Urinary tract infection, site not specified: Secondary | ICD-10-CM | POA: Diagnosis present

## 2020-07-18 DIAGNOSIS — E039 Hypothyroidism, unspecified: Secondary | ICD-10-CM | POA: Diagnosis not present

## 2020-07-18 DIAGNOSIS — Z885 Allergy status to narcotic agent status: Secondary | ICD-10-CM

## 2020-07-18 DIAGNOSIS — G629 Polyneuropathy, unspecified: Secondary | ICD-10-CM | POA: Diagnosis present

## 2020-07-18 DIAGNOSIS — T83511A Infection and inflammatory reaction due to indwelling urethral catheter, initial encounter: Principal | ICD-10-CM | POA: Diagnosis present

## 2020-07-18 DIAGNOSIS — N136 Pyonephrosis: Secondary | ICD-10-CM | POA: Diagnosis not present

## 2020-07-18 DIAGNOSIS — M79605 Pain in left leg: Secondary | ICD-10-CM | POA: Diagnosis not present

## 2020-07-18 DIAGNOSIS — K862 Cyst of pancreas: Secondary | ICD-10-CM | POA: Diagnosis present

## 2020-07-18 DIAGNOSIS — M7989 Other specified soft tissue disorders: Secondary | ICD-10-CM | POA: Diagnosis not present

## 2020-07-18 DIAGNOSIS — J449 Chronic obstructive pulmonary disease, unspecified: Secondary | ICD-10-CM | POA: Diagnosis present

## 2020-07-18 DIAGNOSIS — E213 Hyperparathyroidism, unspecified: Secondary | ICD-10-CM | POA: Diagnosis present

## 2020-07-18 DIAGNOSIS — E785 Hyperlipidemia, unspecified: Secondary | ICD-10-CM | POA: Diagnosis present

## 2020-07-18 DIAGNOSIS — I130 Hypertensive heart and chronic kidney disease with heart failure and stage 1 through stage 4 chronic kidney disease, or unspecified chronic kidney disease: Secondary | ICD-10-CM | POA: Diagnosis not present

## 2020-07-18 DIAGNOSIS — N179 Acute kidney failure, unspecified: Secondary | ICD-10-CM | POA: Diagnosis present

## 2020-07-18 DIAGNOSIS — Z79899 Other long term (current) drug therapy: Secondary | ICD-10-CM

## 2020-07-18 DIAGNOSIS — N1831 Chronic kidney disease, stage 3a: Secondary | ICD-10-CM | POA: Diagnosis present

## 2020-07-18 DIAGNOSIS — I10 Essential (primary) hypertension: Secondary | ICD-10-CM | POA: Diagnosis present

## 2020-07-18 DIAGNOSIS — M79662 Pain in left lower leg: Secondary | ICD-10-CM | POA: Diagnosis not present

## 2020-07-18 DIAGNOSIS — N183 Chronic kidney disease, stage 3 unspecified: Secondary | ICD-10-CM | POA: Diagnosis present

## 2020-07-18 DIAGNOSIS — Z6831 Body mass index (BMI) 31.0-31.9, adult: Secondary | ICD-10-CM

## 2020-07-18 DIAGNOSIS — Z20822 Contact with and (suspected) exposure to covid-19: Secondary | ICD-10-CM | POA: Diagnosis not present

## 2020-07-18 DIAGNOSIS — Z888 Allergy status to other drugs, medicaments and biological substances status: Secondary | ICD-10-CM

## 2020-07-18 DIAGNOSIS — E890 Postprocedural hypothyroidism: Secondary | ICD-10-CM | POA: Diagnosis present

## 2020-07-18 DIAGNOSIS — Z8249 Family history of ischemic heart disease and other diseases of the circulatory system: Secondary | ICD-10-CM

## 2020-07-18 DIAGNOSIS — M79661 Pain in right lower leg: Secondary | ICD-10-CM | POA: Diagnosis not present

## 2020-07-18 DIAGNOSIS — R339 Retention of urine, unspecified: Secondary | ICD-10-CM

## 2020-07-18 DIAGNOSIS — E871 Hypo-osmolality and hyponatremia: Secondary | ICD-10-CM | POA: Diagnosis not present

## 2020-07-18 DIAGNOSIS — Z87898 Personal history of other specified conditions: Secondary | ICD-10-CM

## 2020-07-18 DIAGNOSIS — I5042 Chronic combined systolic (congestive) and diastolic (congestive) heart failure: Secondary | ICD-10-CM | POA: Diagnosis present

## 2020-07-18 DIAGNOSIS — Z886 Allergy status to analgesic agent status: Secondary | ICD-10-CM

## 2020-07-18 DIAGNOSIS — I420 Dilated cardiomyopathy: Secondary | ICD-10-CM | POA: Diagnosis present

## 2020-07-18 DIAGNOSIS — Z881 Allergy status to other antibiotic agents status: Secondary | ICD-10-CM

## 2020-07-18 DIAGNOSIS — I5040 Unspecified combined systolic (congestive) and diastolic (congestive) heart failure: Secondary | ICD-10-CM

## 2020-07-18 DIAGNOSIS — N281 Cyst of kidney, acquired: Secondary | ICD-10-CM | POA: Diagnosis present

## 2020-07-18 DIAGNOSIS — E669 Obesity, unspecified: Secondary | ICD-10-CM | POA: Diagnosis present

## 2020-07-18 DIAGNOSIS — R300 Dysuria: Secondary | ICD-10-CM | POA: Diagnosis present

## 2020-07-18 DIAGNOSIS — K219 Gastro-esophageal reflux disease without esophagitis: Secondary | ICD-10-CM | POA: Diagnosis present

## 2020-07-18 DIAGNOSIS — R5381 Other malaise: Secondary | ICD-10-CM | POA: Diagnosis present

## 2020-07-18 DIAGNOSIS — Y846 Urinary catheterization as the cause of abnormal reaction of the patient, or of later complication, without mention of misadventure at the time of the procedure: Secondary | ICD-10-CM | POA: Diagnosis present

## 2020-07-18 DIAGNOSIS — Z79890 Hormone replacement therapy (postmenopausal): Secondary | ICD-10-CM

## 2020-07-18 LAB — CBC
HCT: 38.8 % (ref 36.0–46.0)
Hemoglobin: 13 g/dL (ref 12.0–15.0)
MCH: 27.8 pg (ref 26.0–34.0)
MCHC: 33.5 g/dL (ref 30.0–36.0)
MCV: 83.1 fL (ref 80.0–100.0)
Platelets: 235 10*3/uL (ref 150–400)
RBC: 4.67 MIL/uL (ref 3.87–5.11)
RDW: 14.5 % (ref 11.5–15.5)
WBC: 11.5 10*3/uL — ABNORMAL HIGH (ref 4.0–10.5)
nRBC: 0 % (ref 0.0–0.2)

## 2020-07-18 LAB — COMPREHENSIVE METABOLIC PANEL
ALT: 27 U/L (ref 0–44)
AST: 31 U/L (ref 15–41)
Albumin: 3.6 g/dL (ref 3.5–5.0)
Alkaline Phosphatase: 65 U/L (ref 38–126)
Anion gap: 12 (ref 5–15)
BUN: 28 mg/dL — ABNORMAL HIGH (ref 8–23)
CO2: 23 mmol/L (ref 22–32)
Calcium: 10.4 mg/dL — ABNORMAL HIGH (ref 8.9–10.3)
Chloride: 95 mmol/L — ABNORMAL LOW (ref 98–111)
Creatinine, Ser: 1.41 mg/dL — ABNORMAL HIGH (ref 0.44–1.00)
GFR, Estimated: 40 mL/min — ABNORMAL LOW (ref >60)
Glucose, Bld: 121 mg/dL — ABNORMAL HIGH (ref 70–99)
Potassium: 3.3 mmol/L — ABNORMAL LOW (ref 3.5–5.1)
Sodium: 130 mmol/L — ABNORMAL LOW (ref 135–145)
Total Bilirubin: 0.8 mg/dL (ref 0.3–1.2)
Total Protein: 7.7 g/dL (ref 6.5–8.1)

## 2020-07-18 LAB — URINALYSIS, COMPLETE (UACMP) WITH MICROSCOPIC
Bilirubin Urine: NEGATIVE
Glucose, UA: NEGATIVE mg/dL
Ketones, ur: NEGATIVE mg/dL
Nitrite: NEGATIVE
Protein, ur: 100 mg/dL — AB
RBC / HPF: >50 RBC/hpf — ABNORMAL HIGH (ref 0–5)
Specific Gravity, Urine: 1.009 (ref 1.005–1.030)
WBC, UA: >50 WBC/hpf — ABNORMAL HIGH (ref 0–5)
pH: 6 (ref 5.0–8.0)

## 2020-07-18 LAB — BRAIN NATRIURETIC PEPTIDE: B Natriuretic Peptide: 68.3 pg/mL (ref 0.0–100.0)

## 2020-07-18 LAB — LIPASE, BLOOD: Lipase: 88 U/L — ABNORMAL HIGH (ref 11–51)

## 2020-07-18 MED ORDER — GABAPENTIN 100 MG PO CAPS
200.0000 mg | ORAL_CAPSULE | Freq: Every day | ORAL | Status: DC
  Administered 2020-07-18 – 2020-07-21 (×4): 200 mg via ORAL
  Filled 2020-07-18 (×4): qty 2

## 2020-07-18 MED ORDER — HYDRALAZINE HCL 50 MG PO TABS
100.0000 mg | ORAL_TABLET | Freq: Three times a day (TID) | ORAL | Status: DC
  Filled 2020-07-18: qty 2

## 2020-07-18 MED ORDER — CLONIDINE HCL 0.1 MG PO TABS
0.1000 mg | ORAL_TABLET | Freq: Two times a day (BID) | ORAL | Status: DC
  Administered 2020-07-18 – 2020-07-22 (×8): 0.1 mg via ORAL
  Filled 2020-07-18 (×8): qty 1

## 2020-07-18 MED ORDER — PIPERACILLIN-TAZOBACTAM 3.375 G IVPB 30 MIN: 3.3750 g | Freq: Once | INTRAVENOUS | Status: DC

## 2020-07-18 MED ORDER — MONTELUKAST SODIUM 10 MG PO TABS
10.0000 mg | ORAL_TABLET | Freq: Every day | ORAL | Status: DC
  Administered 2020-07-18 – 2020-07-22 (×5): 10 mg via ORAL
  Filled 2020-07-18 (×5): qty 1

## 2020-07-18 MED ORDER — POTASSIUM CITRATE-CITRIC ACID 1100-334 MG/5ML PO SOLN
20.0000 meq | Freq: Three times a day (TID) | ORAL | Status: DC
  Filled 2020-07-18 (×2): qty 10

## 2020-07-18 MED ORDER — SODIUM CHLORIDE 0.9 % IV SOLN
1.0000 g | INTRAVENOUS | Status: DC
  Administered 2020-07-19 – 2020-07-20 (×2): 1 g via INTRAVENOUS
  Filled 2020-07-18 (×2): qty 1
  Filled 2020-07-18: qty 10

## 2020-07-18 MED ORDER — ACETAMINOPHEN 650 MG RE SUPP: 650.0000 mg | Freq: Four times a day (QID) | RECTAL | Status: AC | PRN

## 2020-07-18 MED ORDER — POTASSIUM CITRATE-CITRIC ACID 1100-334 MG/5ML PO SOLN
10.0000 mL | Freq: Three times a day (TID) | ORAL | Status: DC
  Administered 2020-07-19 – 2020-07-22 (×11): 20 meq via ORAL
  Filled 2020-07-18 (×16): qty 10

## 2020-07-18 MED ORDER — LOSARTAN POTASSIUM 50 MG PO TABS: 100.0000 mg | ORAL_TABLET | Freq: Every day | ORAL | Status: DC

## 2020-07-18 MED ORDER — ONDANSETRON HCL 4 MG PO TABS: 4.0000 mg | ORAL_TABLET | Freq: Four times a day (QID) | ORAL | Status: DC | PRN

## 2020-07-18 MED ORDER — ENOXAPARIN SODIUM 40 MG/0.4ML IJ SOSY
40.0000 mg | PREFILLED_SYRINGE | INTRAMUSCULAR | Status: DC
  Administered 2020-07-18: 22:00:00 40 mg via SUBCUTANEOUS
  Filled 2020-07-18: qty 0.4

## 2020-07-18 MED ORDER — ONDANSETRON HCL 4 MG/2ML IJ SOLN: 4.0000 mg | Freq: Four times a day (QID) | INTRAMUSCULAR | Status: DC | PRN

## 2020-07-18 MED ORDER — PRAVASTATIN SODIUM 40 MG PO TABS
40.0000 mg | ORAL_TABLET | Freq: Every day | ORAL | Status: DC
  Administered 2020-07-18 – 2020-07-21 (×4): 40 mg via ORAL
  Filled 2020-07-18: qty 1
  Filled 2020-07-18 (×2): qty 2
  Filled 2020-07-18 (×2): qty 1
  Filled 2020-07-18 (×2): qty 2
  Filled 2020-07-18 (×2): qty 1

## 2020-07-18 MED ORDER — TAMSULOSIN HCL 0.4 MG PO CAPS
0.4000 mg | ORAL_CAPSULE | Freq: Every day | ORAL | Status: DC
  Administered 2020-07-18 – 2020-07-21 (×4): 0.4 mg via ORAL
  Filled 2020-07-18 (×4): qty 1

## 2020-07-18 MED ORDER — CARVEDILOL 6.25 MG PO TABS: 6.2500 mg | ORAL_TABLET | Freq: Two times a day (BID) | ORAL | Status: DC

## 2020-07-18 MED ORDER — SODIUM CHLORIDE 0.9 % IV SOLN
1.0000 g | Freq: Once | INTRAVENOUS | Status: AC
  Administered 2020-07-18: 1 g via INTRAVENOUS
  Filled 2020-07-18: qty 10

## 2020-07-18 MED ORDER — FLUTICASONE PROPIONATE 50 MCG/ACT NA SUSP
2.0000 | Freq: Every day | NASAL | Status: DC
  Administered 2020-07-18 – 2020-07-22 (×4): 2 via NASAL
  Filled 2020-07-18: qty 16

## 2020-07-18 MED ORDER — CARVEDILOL 6.25 MG PO TABS
6.2500 mg | ORAL_TABLET | Freq: Two times a day (BID) | ORAL | Status: DC
  Administered 2020-07-18 – 2020-07-22 (×8): 6.25 mg via ORAL
  Filled 2020-07-18 (×8): qty 1

## 2020-07-18 MED ORDER — PANTOPRAZOLE SODIUM 40 MG PO TBEC
40.0000 mg | DELAYED_RELEASE_TABLET | Freq: Every day | ORAL | Status: DC
  Administered 2020-07-18 – 2020-07-22 (×5): 40 mg via ORAL
  Filled 2020-07-18 (×5): qty 1

## 2020-07-18 MED ORDER — LEVOTHYROXINE SODIUM 88 MCG PO TABS
88.0000 ug | ORAL_TABLET | Freq: Every day | ORAL | Status: DC
  Administered 2020-07-19 – 2020-07-22 (×4): 88 ug via ORAL
  Filled 2020-07-18 (×4): qty 1

## 2020-07-18 MED ORDER — ADULT MULTIVITAMIN W/MINERALS CH
ORAL_TABLET | Freq: Every day | ORAL | Status: DC
  Administered 2020-07-19 – 2020-07-22 (×4): 1 via ORAL
  Filled 2020-07-18 (×7): qty 1

## 2020-07-18 MED ORDER — FLUTICASONE PROPIONATE HFA 110 MCG/ACT IN AERO: 1.0000 | INHALATION_SPRAY | Freq: Two times a day (BID) | RESPIRATORY_TRACT | Status: DC

## 2020-07-18 MED ORDER — ACETAMINOPHEN 325 MG PO TABS
650.0000 mg | ORAL_TABLET | Freq: Four times a day (QID) | ORAL | Status: AC | PRN
  Administered 2020-07-18 – 2020-07-20 (×4): 650 mg via ORAL
  Filled 2020-07-18 (×4): qty 2

## 2020-07-18 MED ORDER — ALBUTEROL SULFATE HFA 108 (90 BASE) MCG/ACT IN AERS
2.0000 | INHALATION_SPRAY | Freq: Four times a day (QID) | RESPIRATORY_TRACT | Status: DC | PRN
Start: 2020-07-18 — End: 2020-07-22
  Filled 2020-07-18: qty 6.7

## 2020-07-18 MED ORDER — SODIUM CHLORIDE 0.9 % IV BOLUS
250.0000 mL | Freq: Once | INTRAVENOUS | Status: AC
  Administered 2020-07-18: 250 mL via INTRAVENOUS

## 2020-07-18 MED ORDER — HYDRALAZINE HCL 50 MG PO TABS
100.0000 mg | ORAL_TABLET | Freq: Three times a day (TID) | ORAL | Status: DC
Start: 2020-07-18 — End: 2020-07-22
  Administered 2020-07-18 – 2020-07-22 (×11): 100 mg via ORAL
  Filled 2020-07-18 (×11): qty 2

## 2020-07-18 MED ORDER — LOSARTAN POTASSIUM 50 MG PO TABS
100.0000 mg | ORAL_TABLET | Freq: Every day | ORAL | Status: DC
  Administered 2020-07-18 – 2020-07-22 (×5): 100 mg via ORAL
  Filled 2020-07-18 (×5): qty 2

## 2020-07-18 MED ORDER — HYDROCODONE-ACETAMINOPHEN 5-325 MG PO TABS: 1.0000 | ORAL_TABLET | Freq: Four times a day (QID) | ORAL | Status: DC | PRN

## 2020-07-18 MED ORDER — LABETALOL HCL 5 MG/ML IV SOLN: 5.0000 mg | INTRAVENOUS | Status: AC | PRN

## 2020-07-18 NOTE — ED Triage Notes (Addendum)
Pt states she was seen here Thursday and since she left her left leg has been hurting and she has had lower abdominal cramping- pt states pain is around her knee- pt denies n/v/d- pt has foley in place

## 2020-07-18 NOTE — ED Notes (Signed)
US at bedside

## 2020-07-18 NOTE — H&P (Addendum)
History and Physical   Stacey [name] GBE:[id number] DOB: 1946-02-05 DOA: 07/18/2020  PCP: Stacey Guise, MD  Outpatient Specialists: Stacey [name] clinic cardiology Patient coming from: Home  I have personally briefly reviewed patient's old medical records in [place].  Chief Concern: Suprapubic abdominal pain  HPI: Stacey [name] is a 75 y.o. female with medical history significant for hypertension, combined heart failure, hyperlipidemia, hyperparathyroid, CKD 3A, history of hypertensive emergency, hypothyroid, dilated cardiomyopathy, presents emergency department for chief concerns of suprapubic abdominal pain and left lower extremity pain.  At bedside, patient denies fever, chest pain, shortness of breath, chills, nausea, vomiting.  She does endorse suprapubic abdominal pain that started the last few days.  She endorses medication compliance.  She states that she has gained about 4 pounds since being discharged from the hospital and she was concerned that she was in heart failure again.  She also endorses left lower extremity pain that started after Foley insertion.  She denies swelling in lower extremity.  She denies trauma to the extremity.  She states that prior to hospitalization previously she was able to ambulate with a cane.  Social history: She denies EtOH, recreational drug use, tobacco use.  Vaccination: Patient is vaccinated for COVID-19.  ROS: Constitutional: no weight change, no fever ENT/Mouth: no sore throat, no rhinorrhea Eyes: no eye pain, no vision changes Cardiovascular: no chest pain, no dyspnea,  no edema, no palpitations Respiratory: no cough, no sputum, no wheezing Gastrointestinal: no nausea, no vomiting, no diarrhea, no constipation Genitourinary: no urinary incontinence, no dysuria, no hematuria Musculoskeletal: + arthralgias, no myalgias Skin: no skin lesions, no pruritus, Neuro: + weakness, no loss of consciousness, no syncope Psych: no  anxiety, no depression, + decrease appetite Heme/Lymph: no bruising, no bleeding  ED Course: Discussed with EDP, patient requiring hospitalization for CAUTI and bilateral lower extremity weakness.  Vitals in the emergency department was remarkable for respiration rate of 18, temperature 99.1, heart rate 81, blood pressure 174/76, SPO2 of 98% on room air.  Labs in the emergency department was remarkable for sodium level 130, potassium 3.3, chloride 95, bicarb 23, BUN 28, serum creatinine 1.41, nonfasting blood glucose 121, EGFR 40, BNP 68.3, WBC 11.5, hemoglobin 13, platelets 235, UA showed large leukocytes.  Bilateral ultrasound of the lower extremity was negative for DVT.  Assessment/Plan  Principal Problem:   UTI (urinary tract infection) Active Problems:   Essential hypertension   Benign essential hypertension   Gastroesophageal reflux disease without esophagitis   Acquired hypothyroidism   CKD (chronic kidney disease), stage IIIa   Chronic obstructive pulmonary disease (HCC)   Dysuria   H/O urinary retention   Hyponatremia   Urinary tract infection secondary to cauti - Foley catheter was placed on 07/14/2020 - Communicated with RN staff to remove Foley - Strict I's and O's - Ceftriaxone IV 1 g daily - Flomax 0.4 mg p.o. daily after supper ordered - We will get repeat urine culture - We will do voiding trial  Hypertension with history of hypertensive emergency - Resumed home carvedilol 6.25 mg p.o. twice daily with meals, clonidine 0.1 mg p.o. twice daily, hydralazine 100 mg 3 times daily, losartan 100 mg p.o. daily - Labetalol 5 mg IV every 2 hours as needed for SBP greater than 170  Hyperlipidemia-pravastatin 40 mg p.o. nightly  Hyponatremia-suspect secondary to Lasix, status post normal saline 250 mL bolus - Holding home Lasix, a.m. team to resume once sodium improves -BMP in the a.m.  Hypothyroid-resumed levothyroxine 88 mcg p.o. daily before  breakfast  GERD-pantoprazole 40 mg p.o. daily  Peripheral neuropathy- gabapentin 200 mg p.o. nightly  Weakness/debility-PT, OT, TOC  A.m. labs: BMP, CBC  As needed medication: Acetaminophen, ondansetron, albuterol inhaler  Chart reviewed.   Hospitalization from 07/12/2020 to 07/15/2020: She presented to the hospital for worsening shortness of breath and was treated for acute on chronic combined systolic and diastolic heart failure.  She was also found to be hypoxic with oxygen saturations of 86% on room air.  She was admitted to progressive cardiac unit for acute exacerbation of chronic systolic and diastolic heart failure, acute hypoxic respiratory failure, and hypertensive emergency.  She was treated with IV Lasix, multiple antihypertensives, IV nitroglycerin GTT.  She initially required BiPAP for acute respiratory failure.  She also had elevated troponin which was felt to be demand ischemia.  Her condition improved and she was weaned off of oxygen therapy.  She developed acute urinary retention requiring multiple in and out urethral catheterization.  She failed voiding trial and she was discharged on Foley catheter with outpatient follow-up with urologist.  07/12/2020: Echocardiogram showed severe global LV systolic dysfunction with ejection fraction of 25% with moderate mitral and tricuspid regurgitation.  Grade 2 diastolic dysfunction.  DVT prophylaxis: Enoxaparin 40 mg subcutaneous every 24 hours Code Status: Full code Diet: Heart healthy Family Communication: Updated spouse, Stacey [name] at bedside Disposition Plan: Pending clinical course Consults called: None at this time Admission status: MedSurg, observation, no telemetry  Past Medical History:  Diagnosis Date  . Hypertension   . Thyroid disease    Past Surgical History:  Procedure Laterality Date  . EYE SURGERY Right 07/09/2019  . GALLBLADDER SURGERY    . KNEE SURGERY    . thyroidectomy     Social History:  reports that  she has never smoked. She has never used smokeless tobacco. She reports previous drug use. She reports that she does not drink alcohol.  Allergies  Allergen Reactions  . Codeine Other (See Comments)    Other reaction(s): GI Intolerance Other Reaction: nausea   chest pain Nausea  Other reaction(s): GI Intolerance, Other (See Comments) Nausea GI Intolerance, nausea, chest pain   . Propoxyphene Other (See Comments)    Other Reaction: nausea and chest pain  . Alendronate     Other reaction(s): Other (See Comments) Chest pain  . Ciprofloxacin Rash  . Lisinopril     Other reaction(s): Cough  . Amlodipine Swelling  . Aspirin     Other reaction(s): Other (See Comments) Other reaction(s): Other (See Comments) Stomach hurt  . Methotrexate [name]  . Tramadol Nausea And Vomiting  . Vicodin [Hydrocodone-Acetaminophen] Nausea And Vomiting   Family History  Problem Relation Age of Onset  . Hypertension Mother   . Diabetes Mother   . Anuerysm Mother   . Diabetes Father   . Hypertension Father    Family history: Family history reviewed and not pertinent  Prior to Admission medications   Medication Sig Start Date End Date Taking? Authorizing Provider  albuterol (VENTOLIN HFA) 108 (90 Base) MCG/ACT inhaler Inhale 2 puffs into the lungs every 6 (six) hours as needed for wheezing or shortness of breath. 10/31/18   Ronnell Freshwater, NP  carvedilol (COREG) 6.25 MG tablet Take 1 tablet (6.25 mg total) by mouth 2 (two) times daily with a meal. 07/15/20   Jennye Boroughs, MD  cetirizine (ZYRTEC) 10 MG tablet Take 1 tablet (10 mg total) by mouth daily. 03/05/20  Ronnell Freshwater, NP  clobetasol ointment (TEMOVATE) 5.52 % Apply 1 application topically 2 (two) times daily as needed (rash).  02/06/16   [provider]  cloNIDine (CATAPRES) 0.1 MG tablet Take 1 tablet (0.1 mg total) by mouth 2 (two) times daily. 05/09/19   Ronnell Freshwater, NP  cyclobenzaprine (FLEXERIL) 10 MG tablet Take 1  tablet (10 mg total) by mouth 3 (three) times daily as needed. 06/25/20   Menshew, Dannielle Karvonen, PA-C  fluticasone (FLONASE) 50 MCG/ACT nasal spray Place 2 sprays into both nostrils daily. 03/23/20   Luiz Ochoa, NP  fluticasone (FLOVENT HFA) 110 MCG/ACT inhaler Inhale 1 puff into the lungs 2 (two) times daily. 07/15/20   Jennye Boroughs, MD  furosemide (LASIX) 20 MG tablet Take 2 tablets (40 mg total) by mouth daily. 07/15/20   Jennye Boroughs, MD  gabapentin (NEURONTIN) 100 MG capsule Take 2 capsules (200 mg total) by mouth at bedtime. 07/15/20   Jennye Boroughs, MD  hydrALAZINE (APRESOLINE) 50 MG tablet Take 2 tablets (100 mg total) by mouth 3 (three) times daily. 01/16/18   Thurnell Lose, MD  HYDROcodone-acetaminophen (NORCO) 5-325 MG tablet Take 1 tablet by mouth 3 (three) times daily as needed. 06/25/20   Menshew, Dannielle Karvonen, PA-C  latanoprost (XALATAN) 0.005 % ophthalmic solution Place 1 drop into both eyes at bedtime.  01/16/14   [provider]  levothyroxine (SYNTHROID, LEVOTHROID) 88 MCG tablet Take 88 mcg by mouth daily before breakfast.  03/11/17   [provider]  losartan (COZAAR) 100 MG tablet Take 1 tablet (100 mg total) by mouth daily. 04/28/20   Luiz Ochoa, NP  montelukast (SINGULAIR) 10 MG tablet Take 1 tablet (10 mg total) by mouth daily. 04/28/20   Luiz Ochoa, NP  Multiple Vitamins-Minerals (MULTIVITAMIN GUMMIES WOMENS) CHEW Chew 2 tablets by mouth daily.    [provider]  ondansetron (ZOFRAN ODT) 4 MG disintegrating tablet Take 1 tablet (4 mg total) by mouth every 8 (eight) hours as needed. 06/25/20   Menshew, Dannielle Karvonen, PA-C  pantoprazole (PROTONIX) 40 MG tablet TAKE 1 TABLET BY MOUTH AS NEEDED 04/18/19   Ronnell Freshwater, NP  pravastatin (PRAVACHOL) 20 MG tablet TAKE 1 TABLET(20 MG) BY MOUTH EVERY NIGHT Patient taking differently: Take 40 mg by mouth at bedtime. 08/19/19   Boscia, Greer Ee, NP  RESTASIS 0.05 % ophthalmic emulsion  Place 1 drop into both eyes daily. 11/01/17   [provider]  timolol (TIMOPTIC) 0.25 % ophthalmic solution Place 1 drop into both eyes 2 (two) times daily.  07/30/09   [provider]   Physical Exam: Vitals:   07/18/20 1700 07/18/20 1800 07/18/20 1900 07/18/20 2019  BP: (!) 149/89 (!) 152/73 (!) 183/86 (!) 194/94  Pulse: 80 81 86 93  Resp:    17  Temp:    98.6 F (37 C)  TempSrc:      SpO2: 97% 96% 98% 100%  Weight:      Height:       Constitutional: appears age-appropriate, NAD, calm, comfortable Eyes: PERRL, lids and conjunctivae normal ENMT: Mucous membranes are moist. Posterior pharynx clear of any exudate or lesions. Age-appropriate dentition. Hearing appropriate Neck: normal, supple, no masses, no thyromegaly Respiratory: clear to auscultation bilaterally, no wheezing, no crackles. Normal respiratory effort. No accessory muscle use.  Cardiovascular: Regular rate and rhythm, no murmurs / rubs / gallops. No extremity edema. 2+ pedal pulses. No carotid bruits.   Abdomen: Obese  abdomen, no tenderness, no masses palpated, no hepatosplenomegaly. Bowel sounds positive.  Musculoskeletal: no clubbing / cyanosis. No joint deformity upper and lower extremities. Good ROM, no contractures, no atrophy. Normal muscle tone.  Bilateral knee crepitus, left greater than the right Skin: no rashes, lesions, ulcers. No induration Neurologic: Sensation intact. Strength 5/5 in all 4.  Psychiatric: Normal judgment and insight. Alert and oriented x 3. Normal mood.   EKG: Not indicated  Chest x-ray on Admission: Not indicated  US Venous Img Lower Bilateral  Result Date: 07/18/2020 CLINICAL DATA:  Lower extremity swelling and pain EXAM: BILATERAL LOWER EXTREMITY VENOUS DOPPLER ULTRASOUND TECHNIQUE: Gray-scale sonography with compression, as well as color and duplex ultrasound, were performed to evaluate the deep venous system(s) from the level of the common femoral vein through the  popliteal and proximal calf veins. COMPARISON:  None. FINDINGS: VENOUS Normal compressibility of the common femoral, superficial femoral, and popliteal veins, as well as the visualized calf veins. Visualized portions of profunda femoral vein and great saphenous vein unremarkable. No filling defects to suggest DVT on grayscale or color Doppler imaging. Doppler waveforms show normal direction of venous flow, normal respiratory plasticity and response to augmentation. OTHER None. Limitations: none IMPRESSION: 1. No evidence of deep venous thrombosis within either lower extremity. Electronically Signed   By: Randa Ngo M.D.   On: 07/18/2020 17:00   Labs on Admission: I have personally reviewed following labs  CBC: Recent Labs  Lab 07/12/20 0623 07/13/20 0453 07/15/20 0555 07/18/20 1553  WBC 6.9 11.2* 6.3 11.5*  NEUTROABS 4.6  --  4.1  --   HGB 13.1 10.7* 12.2 13.0  HCT 40.4 32.2* 37.6 38.8  MCV 86.3 85.9 86.2 83.1  PLT 241 201 210 956   Basic Metabolic Panel: Recent Labs  Lab 07/12/20 0623 07/13/20 0453 07/14/20 0612 07/15/20 0555 07/18/20 1553  NA 138 136 139 139 130*  K 4.3 3.6 4.0 3.6 3.3*  CL 107 103 105 104 95*  CO2 '24 23 25 26 23  ' GLUCOSE 187* 166* 106* 130* 121*  BUN 23 32* 36* 32* 28*  CREATININE 1.43* 1.74* 1.59* 1.37* 1.41*  CALCIUM 10.4* 10.3 10.7* 10.6* 10.4*  MG  --  1.7  --   --   --    GFR: Estimated Creatinine Clearance: 28.1 mL/min (A) (by C-G formula based on SCr of 1.41 mg/dL (H)).  Liver Function Tests: Recent Labs  Lab 07/12/20 0623 07/18/20 1553  AST 36 31  ALT 25 27  ALKPHOS 73 65  BILITOT 0.9 0.8  PROT 7.9 7.7  ALBUMIN 3.7 3.6   Recent Labs  Lab 07/18/20 1553  LIPASE 88*   Urine analysis:    Component Value Date/Time   COLORURINE YELLOW (A) 07/18/2020 1727   APPEARANCEUR CLOUDY (A) 07/18/2020 1727   APPEARANCEUR Clear 11/14/2019 1515   LABSPEC 1.009 07/18/2020 1727   PHURINE 6.0 07/18/2020 1727   GLUCOSEU NEGATIVE 07/18/2020 1727    HGBUR LARGE (A) 07/18/2020 1727   BILIRUBINUR NEGATIVE 07/18/2020 1727   BILIRUBINUR Negative 11/14/2019 [place] 07/18/2020 1727   PROTEINUR 100 (A) 07/18/2020 1727   NITRITE NEGATIVE 07/18/2020 1727   LEUKOCYTESUR LARGE (A) 07/18/2020 1727   Zayne Marovich N Janiylah Hannis D.O. [facility]  If 7PM-7AM, please contact overnight-coverage provider If 7AM-7PM, please contact day coverage provider [email or web address]  07/18/2020, 9:21 PM

## 2020-07-18 NOTE — ED Provider Notes (Signed)
[facility] Emergency Department Provider Note  ____________________________________________   Event Date/Time   First MD Initiated Contact with Patient 07/18/20 1544     (approximate)  I have reviewed the triage vital signs and the nursing notes.   HISTORY  Chief Complaint Leg Pain and Abdominal Pain  HPI Stacey [name] is a 75 y.o. female who presents to the emergency department today for evaluation of left leg pain and lower abdominal cramping.  She describes her left leg pain as being around the medial aspect of her knee, states has been present since she was recently discharged from our facility this past Thursday.  She states that she was admitted for CHF exacerbation and required Lasix.  She had trouble urinating towards the end of her hospitalization and was given a Foley catheter to go home with until follow-up with urology.  She states that she began having lower abdominal cramping that began slowly on Thursday but has been worsening.  She denies any nausea vomiting or diarrhea, denies any known fevers.  She describes her abdominal cramping as feeling like menstrual cramps except that she is postmenopausal.  She denies any other acute complaints.       Past Medical History:  Diagnosis Date  . Hypertension   . Thyroid disease     Patient Active Problem List   Diagnosis Date Noted  . UTI (urinary tract infection) 07/18/2020  . H/O urinary retention 07/18/2020  . Hyponatremia 07/18/2020  . Acute on chronic combined systolic and diastolic CHF (congestive heart failure) ([place]) 07/12/2020  . Hypertensive emergency 07/12/2020  . Elevated troponin 07/12/2020  . Hypothyroidism 07/12/2020  . Neuralgia and neuritis 03/14/2020  . Primary generalized (osteo)arthritis 11/30/2019  . Encounter for screening mammogram for malignant neoplasm of breast 11/30/2019  . Vasomotor rhinitis 06/28/2019  . Atopic dermatitis 06/28/2019  . Localized superficial  swelling, mass, or lump 06/28/2019  . Plantar neuroma of left foot 06/28/2019  . Chronic obstructive pulmonary disease ([place]) 11/03/2018  . Right flank pain 11/03/2018  . Dysuria 11/03/2018  . Acute upper respiratory infection 05/06/2018  . Acquired hypothyroidism 03/18/2018  . CKD (chronic kidney disease), stage IIIa 03/18/2018  . ARF (acute renal failure) ([place]) 01/14/2018  . Hypertensive crisis 01/14/2018  . Acute respiratory failure with hypoxia ([place]) 01/13/2018  . Encounter for general adult medical examination with abnormal findings 10/26/2017  . Allergic rhinitis 10/26/2017  . Vitamin D deficiency 10/26/2017  . Gastroesophageal reflux disease without esophagitis 10/26/2017  . Essential hypertension 04/16/2017  . Thyroid disease 04/16/2017  . 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 Dilated cardiomyopathy ([place]) 06/27/2016  . Bilateral carotid artery stenosis 05/30/2016  . SOBOE (shortness of breath on exertion) 05/30/2016  . Hyperparathyroidism, primary ([place]) 08/25/2015  . Hypercalcemia 08/12/2014  . LVH (left ventricular hypertrophy) due to hypertensive disease, without heart failure 07/09/2014  . Moderate mitral insufficiency 07/09/2014  . Benign essential hypertension 07/06/2014  . 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 Mixed hyperlipidemia 01/08/2014  . Chronic pelvic pain in female 05/31/2011  . Fibroids 05/31/2011  . Sarcoidosis 05/31/2011    Past Surgical History:  Procedure Laterality Date  . EYE SURGERY Right 07/09/2019  . GALLBLADDER SURGERY    . KNEE SURGERY    . thyroidectomy      Prior to Admission medications   Medication Sig Start Date End Date Taking? Authorizing Provider  albuterol (VENTOLIN HFA) 108 (90 Base) MCG/ACT inhaler Inhale 2 puffs into the lungs every 6 (six) hours as needed  for wheezing or shortness of breath. 10/31/18  Yes Ronnell Freshwater, NP  carvedilol (COREG) 6.25 MG tablet Take 1 tablet (6.25 mg total) by  mouth 2 (two) times daily with a meal. 07/15/20  Yes Jennye Boroughs, MD  cetirizine (ZYRTEC) 10 MG tablet Take 1 tablet (10 mg total) by mouth daily. 03/05/20  Yes Boscia, Greer Ee, NP  clobetasol ointment (TEMOVATE) 0.97 % Apply 1 application topically 2 (two) times daily as needed (rash).  02/06/16  Yes [provider]  cloNIDine (CATAPRES) 0.1 MG tablet Take 1 tablet (0.1 mg total) by mouth 2 (two) times daily. 05/09/19  Yes Boscia, Heather E, NP  fluticasone (FLONASE) 50 MCG/ACT nasal spray Place 2 sprays into both nostrils daily. 03/23/20  Yes Luiz Ochoa, NP  furosemide (LASIX) 20 MG tablet Take 2 tablets (40 mg total) by mouth daily. 07/15/20  Yes Jennye Boroughs, MD  gabapentin (NEURONTIN) 100 MG capsule Take 2 capsules (200 mg total) by mouth at bedtime. 07/15/20  Yes Jennye Boroughs, MD  hydrALAZINE (APRESOLINE) 50 MG tablet Take 2 tablets (100 mg total) by mouth 3 (three) times daily. 01/16/18  Yes Thurnell Lose, MD  latanoprost (XALATAN) 0.005 % ophthalmic solution Place 1 drop into both eyes at bedtime.  01/16/14  Yes [provider]  levothyroxine (SYNTHROID, LEVOTHROID) 88 MCG tablet Take 88 mcg by mouth daily before breakfast.  03/11/17  Yes [provider]  losartan (COZAAR) 100 MG tablet Take 1 tablet (100 mg total) by mouth daily. 04/28/20  Yes Luiz Ochoa, NP  montelukast (SINGULAIR) 10 MG tablet Take 1 tablet (10 mg total) by mouth daily. 04/28/20  Yes Luiz Ochoa, NP  Multiple Vitamins-Minerals (MULTIVITAMIN GUMMIES WOMENS) CHEW Chew 2 tablets by mouth daily.   Yes [provider]  pantoprazole (PROTONIX) 40 MG tablet TAKE 1 TABLET BY MOUTH AS NEEDED Patient taking differently: Take 40 mg by mouth daily. 04/18/19  Yes Boscia, Heather E, NP  pravastatin (PRAVACHOL) 20 MG tablet TAKE 1 TABLET(20 MG) BY MOUTH EVERY NIGHT Patient taking differently: Take 40 mg by mouth at bedtime. 08/19/19  Yes Boscia, Heather E, NP  timolol (TIMOPTIC) 0.25  % ophthalmic solution Place 1 drop into both eyes 2 (two) times daily.  07/30/09  Yes [provider]  cyclobenzaprine (FLEXERIL) 10 MG tablet Take 1 tablet (10 mg total) by mouth 3 (three) times daily as needed. Patient not taking: Reported on 07/18/2020 06/25/20   Menshew, Dannielle Karvonen, PA-C  fluticasone (FLOVENT HFA) 110 MCG/ACT inhaler Inhale 1 puff into the lungs 2 (two) times daily. Patient not taking: Reported on 07/18/2020 07/15/20   Jennye Boroughs, MD  HYDROcodone-acetaminophen [facility]) 5-325 MG tablet Take 1 tablet by mouth 3 (three) times daily as needed. Patient not taking: Reported on 07/18/2020 06/25/20   Menshew, Dannielle Karvonen, PA-C  ondansetron (ZOFRAN ODT) 4 MG disintegrating tablet Take 1 tablet (4 mg total) by mouth every 8 (eight) hours as needed. Patient not taking: Reported on 07/18/2020 06/25/20   Menshew, Dannielle Karvonen, PA-C  RESTASIS 0.05 % ophthalmic emulsion Place 1 drop into both eyes daily. Patient not taking: Reported on 07/18/2020 11/01/17   [provider]    Allergies Codeine, Propoxyphene, Alendronate, Ciprofloxacin, Lisinopril, Amlodipine, Aspirin, Methotrexate, Tramadol, and Vicodin [hydrocodone-acetaminophen]  Family History  Problem Relation Age of Onset  . Hypertension Mother   . Diabetes Mother   . Anuerysm Mother   . Diabetes Father   . Hypertension Father  Social History Social History   Tobacco Use  . Smoking status: Never Smoker  . Smokeless tobacco: Never Used  Vaping Use  . Vaping Use: Never used  Substance Use Topics  . Alcohol use: No  . Drug use: Not Currently    Review of Systems Constitutional: No fever/chills Eyes: No visual changes. ENT: No sore throat. Cardiovascular: Denies chest pain. Respiratory: Denies shortness of breath. Gastrointestinal: + abdominal pain.  No nausea, no vomiting.  No diarrhea.  No constipation. Genitourinary: Negative for dysuria. Musculoskeletal: + Left leg pain, negative for back  pain. Skin: Negative for rash. Neurological: Negative for headaches, focal weakness or numbness.  ____________________________________________   PHYSICAL EXAM:  VITAL SIGNS: ED Triage Vitals  Enc Vitals Group     BP 07/18/20 1548 (!) 162/75     Pulse Rate 07/18/20 1548 83     Resp 07/18/20 1548 18     Temp 07/18/20 1548 99.1 F (37.3 C)     Temp Source 07/18/20 1548 Oral     SpO2 07/18/20 1548 98 %     Weight 07/18/20 1549 144 lb (65.3 kg)     Height 07/18/20 1549 4\' 9"  (1.448 m)     Head Circumference --      Peak Flow --      Pain Score 07/18/20 1548 8     Pain Loc --      Pain Edu? --      Excl. in [place]? --    Constitutional: Alert and oriented. Well appearing and in no acute distress. Eyes: Conjunctivae are normal. PERRL. EOMI. Head: Atraumatic. Nose: No congestion/rhinnorhea. Mouth/Throat: Mucous membranes are moist.  Neck: No stridor.   Cardiovascular: Normal rate, regular rhythm. Grossly normal heart sounds.  Good peripheral circulation.  No pitting edema present. Respiratory: Normal respiratory effort.  No retractions. Lungs CTAB. Gastrointestinal: Soft.  Mild suprapubic tenderness noted, however no peritoneal signs or significant acute tenderness in any of the quadrants.  No distention. No abdominal bruits. No CVA tenderness. Musculoskeletal: There is tenderness to palpation of the medial aspect of the knee.  No swelling or erythema to the calves.  Full range of motion of the left knee without difficulty.  Dorsal pedal pulse 2+, capillary refill less than 3 seconds. Neurologic:  Normal speech and language. No gross focal neurologic deficits are appreciated.  Skin:  Skin is warm, dry and intact. No rash noted. Psychiatric: Mood and affect are normal. Speech and behavior are normal.  ____________________________________________   LABS (all labs ordered are listed, but only abnormal results are displayed)  Labs Reviewed  LIPASE, BLOOD - Abnormal; Notable for the  following components:      Result Value   Lipase 88 (*)    All other components within normal limits  COMPREHENSIVE METABOLIC PANEL - Abnormal; Notable for the following components:   Sodium 130 (*)    Potassium 3.3 (*)    Chloride 95 (*)    Glucose, Bld 121 (*)    BUN 28 (*)    Creatinine, Ser 1.41 (*)    Calcium 10.4 (*)    GFR, Estimated 40 (*)    All other components within normal limits  CBC - Abnormal; Notable for the following components:   WBC 11.5 (*)    All other components within normal limits  URINALYSIS, COMPLETE (UACMP) WITH MICROSCOPIC - Abnormal; Notable for the following components:   Color, Urine YELLOW (*)    APPearance CLOUDY (*)    Hgb urine dipstick LARGE (*)  Protein, ur 100 (*)    Leukocytes,Ua LARGE (*)    RBC / HPF >50 (*)    WBC, UA >50 (*)    Bacteria, UA FEW (*)    All other components within normal limits  URINE CULTURE  BRAIN NATRIURETIC PEPTIDE  BASIC METABOLIC PANEL  CBC    ____________________________________________  RADIOLOGY  Official radiology report(s): US Venous Img Lower Bilateral  Result Date: 07/18/2020 CLINICAL DATA:  Lower extremity swelling and pain EXAM: BILATERAL LOWER EXTREMITY VENOUS DOPPLER ULTRASOUND TECHNIQUE: Gray-scale sonography with compression, as well as color and duplex ultrasound, were performed to evaluate the deep venous system(s) from the level of the common femoral vein through the popliteal and proximal calf veins. COMPARISON:  None. FINDINGS: VENOUS Normal compressibility of the common femoral, superficial femoral, and popliteal veins, as well as the visualized calf veins. Visualized portions of profunda femoral vein and great saphenous vein unremarkable. No filling defects to suggest DVT on grayscale or color Doppler imaging. Doppler waveforms show normal direction of venous flow, normal respiratory plasticity and response to augmentation. OTHER None. Limitations: none IMPRESSION: 1. No evidence of deep  venous thrombosis within either lower extremity. Electronically Signed   By: Randa Ngo M.D.   On: 07/18/2020 17:00    ____________________________________________   INITIAL IMPRESSION / ASSESSMENT AND PLAN / ED COURSE  As part of my medical decision making, I reviewed the following data within the [place] notes reviewed and incorporated, Labs reviewed, Discussed with admitting physician and Notes from prior ED visits        Patient is a 75 year old female who presents to the emergency department for evaluation of left leg pain and lower abdominal pain, see HPI for further details.  Patient was recently discharged from our facility 4 days ago which is also the time her symptoms began.  She was treated for CHF exacerbation and was given an indwelling Foley catheter given acute urinary retention.  In regards to her left leg pain, she does not have any swelling or edema present.  Given her recent increase time in the bed and other risk factors, a DVT study was obtained and was negative.  No traumas or falls to suggest acute bony injury.  In regards to her suprapubic pain, she does have an indwelling Foley catheter that was placed during her recent hospitalization.  Laboratory evaluation shows a concern for significant UTI.  Her electrolytes are also slightly decreased, I suspect that this is related to dehydration due to decreased appetite with the abdominal pain as well as her continuing her Lasix.  Discussed with Dr. Rudie Meyer, EM attending who agrees the patient would likely benefit from admission.  Case was discussed with Dr. Tobie Poet for admission, and she is agreeable with plan.  Patient was started on 1 dose of Rocephin and was given gentle fluid hydration of 250 mL bolus.  Patient stable at this time for transfer to the floor.      ____________________________________________   FINAL CLINICAL IMPRESSION(S) / ED DIAGNOSES  Final diagnoses:  Urinary tract  infection associated with indwelling urethral catheter, initial encounter ([place])  Combined systolic and diastolic congestive heart failure, unspecified HF chronicity ([place])  Left leg pain     ED Discharge Orders    None      *Please note:  CHENEL WERNLI was evaluated in Emergency Department on 07/18/2020 for the symptoms described in the history of present illness. She was evaluated in the context of the [place] COVID-19  pandemic, which necessitated consideration that the patient might be at risk for infection with the SARS-CoV-2 virus that causes COVID-19. Institutional protocols and algorithms that pertain to the evaluation of patients at risk for COVID-19 are in a state of rapid change based on information released by regulatory bodies including the CDC and federal and state organizations. These policies and algorithms were followed during the patient's care in the ED.  Some ED evaluations and interventions may be delayed as a result of limited staffing during and the pandemic.*   Note:  This document was prepared using Dragon voice recognition software and may include unintentional dictation errors.   Marlana Salvage, PA 07/18/20 2159    Naaman Plummer, MD 07/18/20 [phone]

## 2020-07-19 ENCOUNTER — Telehealth: Payer: Self-pay

## 2020-07-19 DIAGNOSIS — I5042 Chronic combined systolic (congestive) and diastolic (congestive) heart failure: Secondary | ICD-10-CM | POA: Diagnosis present

## 2020-07-19 DIAGNOSIS — N390 Urinary tract infection, site not specified: Secondary | ICD-10-CM | POA: Diagnosis not present

## 2020-07-19 DIAGNOSIS — N179 Acute kidney failure, unspecified: Secondary | ICD-10-CM | POA: Diagnosis not present

## 2020-07-19 DIAGNOSIS — E890 Postprocedural hypothyroidism: Secondary | ICD-10-CM | POA: Diagnosis present

## 2020-07-19 DIAGNOSIS — E785 Hyperlipidemia, unspecified: Secondary | ICD-10-CM | POA: Diagnosis present

## 2020-07-19 DIAGNOSIS — Z888 Allergy status to other drugs, medicaments and biological substances status: Secondary | ICD-10-CM | POA: Diagnosis not present

## 2020-07-19 DIAGNOSIS — Z886 Allergy status to analgesic agent status: Secondary | ICD-10-CM | POA: Diagnosis not present

## 2020-07-19 DIAGNOSIS — N281 Cyst of kidney, acquired: Secondary | ICD-10-CM | POA: Diagnosis not present

## 2020-07-19 DIAGNOSIS — I420 Dilated cardiomyopathy: Secondary | ICD-10-CM | POA: Diagnosis present

## 2020-07-19 DIAGNOSIS — Z87898 Personal history of other specified conditions: Secondary | ICD-10-CM | POA: Diagnosis not present

## 2020-07-19 DIAGNOSIS — K5750 Diverticulosis of both small and large intestine without perforation or abscess without bleeding: Secondary | ICD-10-CM | POA: Diagnosis not present

## 2020-07-19 DIAGNOSIS — R300 Dysuria: Secondary | ICD-10-CM | POA: Diagnosis not present

## 2020-07-19 DIAGNOSIS — Z20822 Contact with and (suspected) exposure to covid-19: Secondary | ICD-10-CM | POA: Diagnosis present

## 2020-07-19 DIAGNOSIS — N1831 Chronic kidney disease, stage 3a: Secondary | ICD-10-CM

## 2020-07-19 DIAGNOSIS — I130 Hypertensive heart and chronic kidney disease with heart failure and stage 1 through stage 4 chronic kidney disease, or unspecified chronic kidney disease: Secondary | ICD-10-CM | POA: Diagnosis present

## 2020-07-19 DIAGNOSIS — J449 Chronic obstructive pulmonary disease, unspecified: Secondary | ICD-10-CM | POA: Diagnosis not present

## 2020-07-19 DIAGNOSIS — E039 Hypothyroidism, unspecified: Secondary | ICD-10-CM | POA: Diagnosis not present

## 2020-07-19 DIAGNOSIS — N2889 Other specified disorders of kidney and ureter: Secondary | ICD-10-CM | POA: Diagnosis not present

## 2020-07-19 DIAGNOSIS — Z885 Allergy status to narcotic agent status: Secondary | ICD-10-CM | POA: Diagnosis not present

## 2020-07-19 DIAGNOSIS — T83511A Infection and inflammatory reaction due to indwelling urethral catheter, initial encounter: Secondary | ICD-10-CM | POA: Diagnosis not present

## 2020-07-19 DIAGNOSIS — K862 Cyst of pancreas: Secondary | ICD-10-CM | POA: Diagnosis not present

## 2020-07-19 DIAGNOSIS — Z881 Allergy status to other antibiotic agents status: Secondary | ICD-10-CM | POA: Diagnosis not present

## 2020-07-19 DIAGNOSIS — K838 Other specified diseases of biliary tract: Secondary | ICD-10-CM | POA: Diagnosis not present

## 2020-07-19 DIAGNOSIS — I10 Essential (primary) hypertension: Secondary | ICD-10-CM | POA: Diagnosis not present

## 2020-07-19 DIAGNOSIS — Z6831 Body mass index (BMI) 31.0-31.9, adult: Secondary | ICD-10-CM | POA: Diagnosis not present

## 2020-07-19 DIAGNOSIS — N1330 Unspecified hydronephrosis: Secondary | ICD-10-CM | POA: Diagnosis not present

## 2020-07-19 DIAGNOSIS — R5381 Other malaise: Secondary | ICD-10-CM | POA: Diagnosis present

## 2020-07-19 DIAGNOSIS — Y846 Urinary catheterization as the cause of abnormal reaction of the patient, or of later complication, without mention of misadventure at the time of the procedure: Secondary | ICD-10-CM | POA: Diagnosis present

## 2020-07-19 DIAGNOSIS — N136 Pyonephrosis: Secondary | ICD-10-CM | POA: Diagnosis present

## 2020-07-19 DIAGNOSIS — K219 Gastro-esophageal reflux disease without esophagitis: Secondary | ICD-10-CM | POA: Diagnosis not present

## 2020-07-19 DIAGNOSIS — G629 Polyneuropathy, unspecified: Secondary | ICD-10-CM | POA: Diagnosis present

## 2020-07-19 DIAGNOSIS — E213 Hyperparathyroidism, unspecified: Secondary | ICD-10-CM | POA: Diagnosis present

## 2020-07-19 DIAGNOSIS — E871 Hypo-osmolality and hyponatremia: Secondary | ICD-10-CM | POA: Diagnosis not present

## 2020-07-19 LAB — BASIC METABOLIC PANEL
Anion gap: 9 (ref 5–15)
BUN: 30 mg/dL — ABNORMAL HIGH (ref 8–23)
CO2: 26 mmol/L (ref 22–32)
Calcium: 9.6 mg/dL (ref 8.9–10.3)
Chloride: 98 mmol/L (ref 98–111)
Creatinine, Ser: 1.82 mg/dL — ABNORMAL HIGH (ref 0.44–1.00)
GFR, Estimated: 29 mL/min — ABNORMAL LOW (ref >60)
Glucose, Bld: 159 mg/dL — ABNORMAL HIGH (ref 70–99)
Potassium: 3.6 mmol/L (ref 3.5–5.1)
Sodium: 133 mmol/L — ABNORMAL LOW (ref 135–145)

## 2020-07-19 LAB — URINALYSIS, COMPLETE (UACMP) WITH MICROSCOPIC
Bacteria, UA: NONE SEEN
Bilirubin Urine: NEGATIVE
Glucose, UA: NEGATIVE mg/dL
Ketones, ur: NEGATIVE mg/dL
Nitrite: NEGATIVE
Protein, ur: 100 mg/dL — AB
Specific Gravity, Urine: 1.01 (ref 1.005–1.030)
Squamous Epithelial / HPF: NONE SEEN (ref 0–5)
WBC, UA: >50 WBC/hpf — ABNORMAL HIGH (ref 0–5)
pH: 5 (ref 5.0–8.0)

## 2020-07-19 LAB — CBC
HCT: 36.1 % (ref 36.0–46.0)
Hemoglobin: 12 g/dL (ref 12.0–15.0)
MCH: 28.1 pg (ref 26.0–34.0)
MCHC: 33.2 g/dL (ref 30.0–36.0)
MCV: 84.5 fL (ref 80.0–100.0)
Platelets: 193 10*3/uL (ref 150–400)
RBC: 4.27 MIL/uL (ref 3.87–5.11)
RDW: 14.6 % (ref 11.5–15.5)
WBC: 9.5 10*3/uL (ref 4.0–10.5)
nRBC: 0.2 % (ref 0.0–0.2)

## 2020-07-19 MED ORDER — SODIUM CHLORIDE 0.9 % IV SOLN: INTRAVENOUS | Status: DC | PRN

## 2020-07-19 MED ORDER — ENOXAPARIN SODIUM 40 MG/0.4ML IJ SOSY: 40.0000 mg | PREFILLED_SYRINGE | INTRAMUSCULAR | Status: DC

## 2020-07-19 MED ORDER — ENOXAPARIN SODIUM 30 MG/0.3ML IJ SOSY: 30.0000 mg | PREFILLED_SYRINGE | INTRAMUSCULAR | Status: DC

## 2020-07-19 MED ORDER — HEPARIN SODIUM (PORCINE) 5000 UNIT/ML IJ SOLN
5000.0000 [IU] | Freq: Three times a day (TID) | INTRAMUSCULAR | Status: DC
  Administered 2020-07-19 – 2020-07-22 (×6): 5000 [IU] via SUBCUTANEOUS
  Filled 2020-07-19 (×10): qty 1

## 2020-07-19 NOTE — Progress Notes (Signed)
Received patient from ED to unit. Patient ambulated to unit bed by self with her cane without assistance or difficulty. Alert and oriented times 4. Lungs clear x2, bowel sounds present x4, Last bm was today, no current complaints of pain. Skin is intact. Will review orders and continue to monitor patient for change.

## 2020-07-19 NOTE — Care Management Obs Status (Signed)
[place] NOTIFICATION   Patient Details  Name: MILEYDI MILSAP MRN: [id number] Date of Birth: [birth year]   Medicare Observation Status Notification Given:  Yes    Shelbie Hutching, RN 07/19/2020, 12:33 PM

## 2020-07-19 NOTE — Evaluation (Signed)
Occupational Therapy Evaluation Patient Details Name: Stacey [name] MRN: [id number] DOB: 1945/10/04 Today's Date: 07/19/2020    History of Present Illness Pt is a 75 y/o F who presents to the ED for evaluation of LLE pain, at the medial aspect of her knee, and lower abdominal cramping. Pt recently d/c from hospital following treatment for CHF exacerbation & sent home with foley catheter. Labratory evaluation concerning for significant UTI. PMH: HTN, thyroid disease   Clinical Impression   [name] was seen for OT evaluation this date. Prior to hospital admission, pt was MOD I for mobility using SPC as needed. Pt lives with spouse in home with 3 STE. Pt presents to acute OT demonstrating impaired ADL performance and functional mobility 2/2 decreased activity tolerance and functional ROM/balance deficits. Pt currently requires MIN A for LBD seated on BSC. CGA + SPC improving to SBA + RW for toilet t/f. SUPERVISION grooming standing sinkside, pt tolerated 10 mins standing grooming tasks. Pt would benefit from skilled OT to address noted impairments and functional limitations (see below for any additional details) in order to maximize safety and independence while minimizing falls risk and caregiver burden. Upon hospital discharge, recommend HHOT to maximize pt safety and return to PLOF.     Follow Up Recommendations  Home health OT    Equipment Recommendations  Other (comment) (2WW)    Recommendations for Other Services       Precautions / Restrictions Restrictions Weight Bearing Restrictions: No      Mobility Bed Mobility               General bed mobility comments: pt received and left in bed    Transfers Overall transfer level: Needs assistance Equipment used: Rolling walker (2 wheeled) Transfers: Sit to/from Stand Sit to Stand: Min guard         General transfer comment: CGA + R rail standing from commode height    Balance Overall balance assessment: Needs  assistance Sitting-balance support: No upper extremity supported;Feet unsupported Sitting balance-Leahy Scale: Good     Standing balance support: No upper extremity supported;During functional activity Standing balance-Leahy Scale: Fair                             ADL either performed or assessed with clinical judgement   ADL Overall ADL's : Needs assistance/impaired                                       General ADL Comments: MIN A for LBD seated on BSC. CGA + SPC improving to SBA + RW for toilet t/f. SUPERVISION grooming standing sinkside.     Vision Baseline Vision/History: Wears glasses Wears Glasses: At all times              Pertinent Vitals/Pain Pain Assessment: Faces Faces Pain Scale: Hurts little more Pain Location: L knee Pain Descriptors / Indicators: Discomfort;Dull;Grimacing Pain Intervention(s): Limited activity within patient's tolerance;Repositioned     Hand Dominance Right   Extremity/Trunk Assessment Upper Extremity Assessment Upper Extremity Assessment: Overall WFL for tasks assessed   Lower Extremity Assessment Lower Extremity Assessment: Generalized weakness       Communication Communication Communication: No difficulties   Cognition Arousal/Alertness: Awake/alert Behavior During Therapy: WFL for tasks assessed/performed Overall Cognitive Status: Within Functional Limits for tasks assessed  General Comments       Exercises Exercises: Other exercises Other Exercises Other Exercises: Pt edcuated re: OT role, DME recs, d/c recs, falls prevention, ECS, adapted dressing techniques Other Exercises: LBD, toileting, grooming, hand washing, tooth brushing, face washing, sit<>stand x2, sitting/standing balance/tolerance, ~30 ft mobility   Shoulder Instructions      Home Living Family/patient expects to be discharged to:: Private residence Living Arrangements:  Spouse/significant other Available Help at Discharge: Family;Available PRN/intermittently (spouse works during day) Type of Home: House Home Access: Stairs to enter Technical brewer of Steps: 3 Entrance Stairs-Rails: Left;Right Home Layout: One level     Bathroom Shower/Tub: Occupational psychologist: Handicapped height     [place]: [place] - single point;Grab bars - tub/shower;Hand held shower head          Prior Functioning/Environment Level of Independence: Independent with assistive device(s)        Comments: Patient reports intermittently using a cane with ambulation due to sciatic nerve pain. Pt is independent with ADLs. Spouse assists with IADLs        OT Problem List: Decreased strength;Decreased activity tolerance;Impaired balance (sitting and/or standing);Cardiopulmonary status limiting activity      OT Treatment/Interventions: Self-care/ADL training;Therapeutic exercise;Energy conservation;DME and/or AE instruction;Therapeutic activities;Visual/perceptual remediation/compensation;Patient/family education;Balance training    OT Goals(Current goals can be found in the care plan section) Acute Rehab OT Goals Patient Stated Goal: to go home OT Goal Formulation: With patient Time For Goal Achievement: 08/02/20 Potential to Achieve Goals: Good ADL Goals Pt Will Perform Grooming: standing;Independently Pt Will Perform Lower Body Dressing: with modified independence;sit to/from stand (c LRAD PRN) Pt Will Transfer to Toilet: with modified independence;ambulating;regular height toilet (c LRAD PRN)  OT Frequency: Min 1X/week    AM-PAC OT "6 Clicks" Daily Activity     Outcome Measure Help from another person eating meals?: None Help from another person taking care of personal grooming?: A Little Help from another person toileting, which includes using toliet, bedpan, or urinal?: A Little Help from another person bathing (including washing, rinsing,  drying)?: A Little Help from another person to put on and taking off regular upper body clothing?: A Little Help from another person to put on and taking off regular lower body clothing?: A Little 6 Click Score: 19   End of Session    Activity Tolerance: Patient tolerated treatment well Patient left: in chair;with call bell/phone within reach;with chair alarm set  OT Visit Diagnosis: Muscle weakness (generalized) (M62.81);Unsteadiness on feet (R26.81)                Time: 2542-7062 OT Time Calculation (min): 33 min Charges:  OT General Charges $OT Visit: 1 Visit OT Evaluation $OT Eval Low Complexity: 1 Low OT Treatments $Self Care/Home Management : 23-37 mins   Dessie Coma, M.S. OTR/L  07/19/20, 4:01 PM  ascom [phone]

## 2020-07-19 NOTE — Plan of Care (Signed)
Patient will continue to progress.

## 2020-07-19 NOTE — Evaluation (Signed)
Physical Therapy Evaluation Patient Details Name: Stacey [name] MRN: [id number] DOB: Jun 21, 1945 Today's Date: 07/19/2020   History of Present Illness  Pt is a 75 y/o F who presents to the ED for evaluation of LLE pain, at the medial aspect of her knee, and lower abdominal cramping. Pt recently d/c from hospital following treatment for CHF exacerbation & sent home with foley catheter. Labratory evaluation concerning for significant UTI. PMH: HTN, thyroid disease  Clinical Impression  Pt seen for PT evaluation with pt reporting ongoing L knee pain but agreeable to tx. Pt is able to complete bed mobility without physical assistance but heavy reliance on bed rails, HOB elevated, & extra time required. Pt initially requires min assist for gait but does fade to supervision with RW (pt requires RW vs cane for increased balance & support) with significantly decreased gait speed. Will continue to follow pt acutely to progress balance & gait with LRAD.     Follow Up Recommendations Home health PT;Supervision for mobility/OOB    Equipment Recommendations  Rolling walker with 5" wheels (youth RW (pt is 4'9"))    Recommendations for Other Services       Precautions / Restrictions Precautions Precautions: Fall Restrictions Weight Bearing Restrictions: No      Mobility  Bed Mobility Overal bed mobility: Needs Assistance Bed Mobility: Supine to Sit     Supine to sit: Supervision;HOB elevated     General bed mobility comments: extra time, reliance on bed rails    Transfers Overall transfer level: Needs assistance Equipment used: Rolling walker (2 wheeled) (initial sit<>stand with cane & mod assist 2/2 posterior LOB & general instability, sit>stand then with min assist with RW with cuing for hand placement & technique) Transfers: Sit to/from Stand Sit to Stand: Min guard         General transfer comment: CGA + R rail standing from commode height  Ambulation/Gait Ambulation/Gait  assistance: Min assist;Min guard;Supervision (min assist fade to close supervision) Gait Distance (Feet): 45 Feet Assistive device: Rolling walker (2 wheeled) Gait Pattern/deviations: Decreased step length - right;Decreased step length - left;Decreased stride length Gait velocity: decreased      Stairs            Wheelchair Mobility    Modified Rankin (Stroke Patients Only)       Balance Overall balance assessment: Needs assistance Sitting-balance support: Feet supported;No upper extremity supported Sitting balance-Leahy Scale: Good Sitting balance - Comments: supervision sitting EOB   Standing balance support: No upper extremity supported;During functional activity Standing balance-Leahy Scale: Poor Standing balance comment: requires BUE support on RW for static standing                             Pertinent Vitals/Pain Pain Assessment: 0-10 Pain Score: 5  Faces Pain Scale: Hurts little more Pain Location: medial aspect of L knee Pain Descriptors / Indicators: Discomfort;Grimacing Pain Intervention(s): Limited activity within patient's tolerance;Monitored during session;Patient requesting pain meds-RN notified    Home Living Family/patient expects to be discharged to:: Private residence Living Arrangements: Spouse/significant other Available Help at Discharge: Family;Available PRN/intermittently (spouse works outside of the home during the day) Type of Home: House Home Access: Stairs to enter Entrance Stairs-Rails: Left;Right;Can reach both Technical brewer of Steps: 3 Home Layout: One [place]: Cane - single point;Grab bars - tub/shower;Hand held shower head      Prior Function Level of Independence: Independent with assistive device(s)  Comments: ambulatory with SPC     Hand Dominance   Dominant Hand: Right    Extremity/Trunk Assessment   Upper Extremity Assessment Upper Extremity Assessment: Overall WFL for  tasks assessed    Lower Extremity Assessment Lower Extremity Assessment: Generalized weakness       Communication   Communication: No difficulties  Cognition Arousal/Alertness: Awake/alert Behavior During Therapy: WFL for tasks assessed/performed Overall Cognitive Status: Within Functional Limits for tasks assessed                                        General Comments General comments (skin integrity, edema, etc.): Educated pt on benefits of LLE LAQ to increase joint lubrication.    Exercises Other Exercises Other Exercises: Pt edcuated re: OT role, DME recs, d/c recs, falls prevention, ECS, adapted dressing techniques Other Exercises: LBD, toileting, grooming, hand washing, tooth brushing, face washing, sit<>stand x2, sitting/standing balance/tolerance, ~30 ft mobility   Assessment/Plan    PT Assessment Patient needs continued PT services  PT Problem List Decreased strength;Decreased activity tolerance;Decreased balance;Decreased mobility;Decreased safety awareness;Decreased knowledge of use of DME;Pain       PT Treatment Interventions DME instruction;Gait training;Functional mobility training;Therapeutic activities;Stair training;Therapeutic exercise;Balance training;Patient/family education;Modalities    PT Goals (Current goals can be found in the Care Plan section)  Acute Rehab PT Goals Patient Stated Goal: get better PT Goal Formulation: With patient Time For Goal Achievement: 08/02/20 Potential to Achieve Goals: Good    Frequency Min 2X/week   Barriers to discharge Decreased caregiver support spouse works outside of the home during the day    Co-evaluation               AM-PAC PT "6 Clicks" Mobility  Outcome Measure Help needed turning from your back to your side while in a flat bed without using bedrails?: None Help needed moving from lying on your back to sitting on the side of a flat bed without using bedrails?: A Little Help needed  moving to and from a bed to a chair (including a wheelchair)?: A Little Help needed standing up from a chair using your arms (e.g., wheelchair or bedside chair)?: A Little Help needed to walk in hospital room?: A Little Help needed climbing 3-5 steps with a railing? : A Little 6 Click Score: 19    End of Session Equipment Utilized During Treatment: Gait belt Activity Tolerance: Patient tolerated treatment well Patient left: in chair;with call bell/phone within reach;with chair alarm set Nurse Communication: Mobility status PT Visit Diagnosis: Unsteadiness on feet (R26.81);Muscle weakness (generalized) (M62.81);Difficulty in walking, not elsewhere classified (R26.2)    Time: 6073-7106 PT Time Calculation (min) (ACUTE ONLY): 23 min   Charges:   PT Evaluation $PT Eval Low Complexity: [place], PT, DPT 07/19/20, 4:11 PM   Waunita Schooner 07/19/2020, 4:09 PM

## 2020-07-19 NOTE — Telephone Encounter (Signed)
Gave verbal order to [facility] [phone] for PT for twice a week for 4 weeks and once a week for 4 weeks and also gave verbal order for nursing  And pt having knee pain as per is taylor is ok to take tylenol

## 2020-07-19 NOTE — TOC Initial Note (Signed)
Transition of Care [facility]) - Initial/Assessment Note    Patient Details  Name: Stacey [name] MRN: [id number] Date of Birth: 1945/09/10  Transition of Care [facility]) CM/SW Contact:    Shelbie Hutching, RN Phone Number: 07/19/2020, 12:37 PM  Clinical Narrative:                 Patient placed under observation for UTI.  Patient was recently discharge from the hospital for the same.  RNCM was able to speak with patient via phone as she is on airborne precautions.   Patient lives with her husband, she has a cane at home.  Patient is current with PCP.  Plan for discharge with home health services, open with [facility].  Corene Cornea with [facility] aware of admission.   Potential discharge for today.  Patient reports that she needs a walker.  Walker ordered from [employer] and will be delivered to the room.  Patient's husband will be able to pick her up if discharges today.   Expected Discharge Plan: [place] Barriers to Discharge: Continued Medical Work up   Patient Goals and CMS Choice Patient states their goals for this hospitalization and ongoing recovery are:: To return home with home health services CMS Medicare.gov Compare Post Acute Care list provided to:: Patient Choice offered to / list presented to : Patient  Expected Discharge Plan and Services Expected Discharge Plan: [place]   Discharge Planning Services: CM Consult Post Acute Care Choice: [place] arrangements for the past 2 months: Single Family Home                 DME Arranged: Walker rolling DME Agency: [facility] Date DME Agency Contacted: 07/19/20 Time DME Agency Contacted: 1234 Representative spoke with at DME Agency: [place]: RN,PT,OT [place] Agency: [place] ([place]) Date [place]: 07/19/20 Time [place]: 1234 Representative spoke with at [place]: Corene Cornea  Prior Living Arrangements/Services Living arrangements for the past 2 months:  [place] with:: Spouse Patient language and need for interpreter reviewed:: Yes Do you feel safe going back to the place where you live?: Yes      Need for Family Participation in Patient Care: Yes (Comment) (UTI) Care giver support system in place?: Yes (comment) (husband and son) Current home services: DME (cane) Criminal Activity/Legal Involvement Pertinent to Current Situation/Hospitalization: No - Comment as needed  Activities of Daily Living Home Assistive Devices/Equipment: Cane (specify quad or straight) ADL Screening (condition at time of admission) Patient's cognitive ability adequate to safely complete daily activities?: Yes Is the patient deaf or have difficulty hearing?: No Does the patient have difficulty seeing, even when wearing glasses/contacts?: No Does the patient have difficulty concentrating, remembering, or making decisions?: No Patient able to express need for assistance with ADLs?: Yes Does the patient have difficulty dressing or bathing?: No Independently performs ADLs?: Yes (appropriate for developmental age) Does the patient have difficulty walking or climbing stairs?: Yes Weakness of Legs: Both Weakness of Arms/Hands: None  Permission Sought/Granted Permission sought to share information with : Case Manager,Family Supports,Other (comment) Permission granted to share information with : Yes, Verbal Permission Granted  Share Information with NAME: Ray  Permission granted to share info w AGENCY: [facility]  Permission granted to share info w Relationship: husband     Emotional Assessment   Attitude/Demeanor/Rapport: Engaged Affect (typically observed): Accepting Orientation: : Oriented to Self,Oriented to Place,Oriented to  Time,Oriented to Situation Alcohol / Substance  Use: Not Applicable Psych Involvement: No (comment)  Admission diagnosis:  UTI (urinary tract infection) [N39.0] Left leg pain [M79.605] Urinary tract infection associated  with indwelling urethral catheter, initial encounter ([place]) [T83.511A, H73.4] Combined systolic and diastolic congestive heart failure, unspecified HF chronicity ([place]) [I50.40] Patient Active Problem List   Diagnosis Date Noted  . UTI (urinary tract infection) 07/18/2020  . H/O urinary retention 07/18/2020  . Hyponatremia 07/18/2020  . Acute on chronic combined systolic and diastolic CHF (congestive heart failure) ([place]) 07/12/2020  . Hypertensive emergency 07/12/2020  . Elevated troponin 07/12/2020  . Hypothyroidism 07/12/2020  . Neuralgia and neuritis 03/14/2020  . Primary generalized (osteo)arthritis 11/30/2019  . Encounter for screening mammogram for malignant neoplasm of breast 11/30/2019  . Vasomotor rhinitis 06/28/2019  . Atopic dermatitis 06/28/2019  . Localized superficial swelling, mass, or lump 06/28/2019  . Plantar neuroma of left foot 06/28/2019  . Chronic obstructive pulmonary disease ([place]) 11/03/2018  . Right flank pain 11/03/2018  . Dysuria 11/03/2018  . Acute upper respiratory infection 05/06/2018  . Acquired hypothyroidism 03/18/2018  . CKD (chronic kidney disease), stage IIIa 03/18/2018  . ARF (acute renal failure) ([place]) 01/14/2018  . Hypertensive crisis 01/14/2018  . Acute respiratory failure with hypoxia ([place]) 01/13/2018  . Encounter for general adult medical examination with abnormal findings 10/26/2017  . Allergic rhinitis 10/26/2017  . Vitamin D deficiency 10/26/2017  . Gastroesophageal reflux disease without esophagitis 10/26/2017  . Essential hypertension 04/16/2017  . Thyroid disease 04/16/2017  . 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 Dilated cardiomyopathy ([place]) 06/27/2016  . Bilateral carotid artery stenosis 05/30/2016  . SOBOE (shortness of breath on exertion) 05/30/2016  . Hyperparathyroidism, primary ([place]) 08/25/2015  . Hypercalcemia 08/12/2014  . LVH (left ventricular hypertrophy) due to hypertensive disease, without heart  failure 07/09/2014  . Moderate mitral insufficiency 07/09/2014  . Benign essential hypertension 07/06/2014  . 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 Mixed hyperlipidemia 01/08/2014  . Chronic pelvic pain in female 05/31/2011  . Fibroids 05/31/2011  . Sarcoidosis 05/31/2011   PCP:  [name], MD Pharmacy:   [facility] [place], [place] [address] [place] [address] Phone: [phone] Fax: [phone]  [facility], [place] [facility] Portal to Registered [employer] Sites [place] [address] Phone: [phone] Fax: [phone]     Social Determinants of Health (SDOH) Interventions    Readmission Risk Interventions No flowsheet data found.

## 2020-07-19 NOTE — Progress Notes (Addendum)
PROGRESS NOTE  Stacey [name]  JSE:[id number] DOB: 1945/12/22 DOA: 07/18/2020 PCP: [name], MD   Brief Narrative: Stacey [name] is a 75 y.o. female with a history of dilated CM, chronic combined HFrEF, HTN, HLD, stage IIIa CKD, hypothyroidism who was recently discharged with foley catheter for urinary retention and returned 5/1 with suprapubic pain. Urinalysis suggests UTI, WBC elevated. Foley catheter was discontinued and the patient has been unable to void significantly since that time. Renal function has worsened. Renal U/S is ordered, antibiotics are continued. Anticipate need to reinsert foley catheter.  Assessment & Plan: Principal Problem:   UTI (urinary tract infection) Active Problems:   Essential hypertension   Benign essential hypertension   Gastroesophageal reflux disease without esophagitis   Acquired hypothyroidism   CKD (chronic kidney disease), stage IIIa   Chronic obstructive pulmonary disease (HCC)   Dysuria   H/O urinary retention   Hyponatremia  CAUTI: WBC improved with abx. Pain significantly improved with abx and removal of foley.  - Continue ceftriaxone 1g q24h, monitor urine culture.   Acute urinary retention: Unclear precipitant.  - Continue flomax - Bladder scan and insert foley if >300cc as pt strained to urinate without much volume this afternoon.  AKI on stage IIIa CKD: SCr up in the setting of stopping foley catheter. Since she's just been diuresed for heart failure, will not start IVF.  - Renal U/S - Check BMP in AM - Hold lasix  Chronic combined HFrEF: Admission for exacerbation 6/25 - 6/28, discharged in improved condition with resolution of hypoxia. LVEF 25%, G2DD, mod MR, mod TR.  - Holding lasix for today with AKI   Obesity: Estimated body mass index is 31.16 kg/m as calculated from the following:   Height as of this encounter: 4\' 9"  (1.448 m).   Weight as of this encounter: 65.3 kg.  DVT prophylaxis: Heparin w/wt 65kg, CrCl  21.19ml/min Code Status: Full Family Communication: None at bedside Disposition Plan:  Status is: Observation  The patient will require care spanning > 2 midnights and should be moved to inpatient because: Persistent severe electrolyte disturbances and Ongoing diagnostic testing needed not appropriate for outpatient work up   Dispo: The patient is from: Home              Anticipated d/c is to: Home w/HHRN, PT, OT              Patient currently is not medically stable to d/c.   Difficult to place patient No  Consultants:   None  Procedures:   None  Antimicrobials:  Ceftriaxone   Subjective: Lower abdominal/suprapubic pain is much better than yesterday but hurts significantly when pressed on. PT working with patient, reports she saw her straining hard to urinate with just a few drops heard. Otherwise hasn't urinated today. Eating and drinking normally. No chest pain or dyspnea even when walking with therapy.  Objective: Vitals:   07/19/20 0739 07/19/20 0908 07/19/20 1225 07/19/20 1557  BP: 117/62 (!) 131/55 117/65 111/70  Pulse: 66 79 79 81  Resp: 15  14 14   Temp: (!) 97.4 F (36.3 C)  98.4 F (36.9 C) 98.1 F (36.7 C)  TempSrc: Oral   Oral  SpO2: 100%  99% 98%  Weight:      Height:        Intake/Output Summary (Last 24 hours) at 07/19/2020 1607 Last data filed at 07/19/2020 1500 Gross per 24 hour  Intake 1070.52 ml  Output 1400 ml  Net -  329.48 ml   Filed Weights   07/18/20 1549  Weight: 65.3 kg    Gen: 75 y.o. female in no distress of small stature Pulm: Non-labored breathing room air. Clear to auscultation bilaterally.  CV: Regular rate and rhythm. No murmur, rub, or gallop. No JVD, no pitting pedal edema. GI: Abdomen soft, guards against suprapubic pressure due to pain, so I am unable to palpate deeply enough to feel bladder. Non-distended, with normoactive bowel sounds. No organomegaly or masses felt. Ext: Warm, no deformities Skin: No rashes, lesions or  ulcers on visualized skin Neuro: Alert and oriented. No focal neurological deficits. Psych: Judgement and insight appear normal. Mood & affect appropriate.   Data Reviewed: I have personally reviewed following labs and imaging studies  CBC: Recent Labs  Lab 07/13/20 0453 07/15/20 0555 07/18/20 1553 07/19/20 0508  WBC 11.2* 6.3 11.5* 9.5  NEUTROABS  --  4.1  --   --   HGB 10.7* 12.2 13.0 12.0  HCT 32.2* 37.6 38.8 36.1  MCV 85.9 86.2 83.1 84.5  PLT 201 210 235 0000000   Basic Metabolic Panel: Recent Labs  Lab 07/13/20 0453 07/14/20 0612 07/15/20 0555 07/18/20 1553 07/19/20 0508  NA 136 139 139 130* 133*  K 3.6 4.0 3.6 3.3* 3.6  CL 103 105 104 95* 98  CO2 23 25 26 23 26   GLUCOSE 166* 106* 130* 121* 159*  BUN 32* 36* 32* 28* 30*  CREATININE 1.74* 1.59* 1.37* 1.41* 1.82*  CALCIUM 10.3 10.7* 10.6* 10.4* 9.6  MG 1.7  --   --   --   --    GFR: Estimated Creatinine Clearance: 21.7 mL/min (A) (by C-G formula based on SCr of 1.82 mg/dL (H)). Liver Function Tests: Recent Labs  Lab 07/18/20 1553  AST 31  ALT 27  ALKPHOS 65  BILITOT 0.8  PROT 7.7  ALBUMIN 3.6   Recent Labs  Lab 07/18/20 1553  LIPASE 88*   No results for input(s): AMMONIA in the last 168 hours. Coagulation Profile: No results for input(s): INR, PROTIME in the last 168 hours. Cardiac Enzymes: No results for input(s): CKTOTAL, CKMB, CKMBINDEX, TROPONINI in the last 168 hours. BNP (last 3 results) No results for input(s): PROBNP in the last 8760 hours. HbA1C: No results for input(s): HGBA1C in the last 72 hours. CBG: No results for input(s): GLUCAP in the last 168 hours. Lipid Profile: No results for input(s): CHOL, HDL, LDLCALC, TRIG, CHOLHDL, LDLDIRECT in the last 72 hours. Thyroid Function Tests: No results for input(s): TSH, T4TOTAL, FREET4, T3FREE, THYROIDAB in the last 72 hours. Anemia Panel: No results for input(s): VITAMINB12, FOLATE, FERRITIN, TIBC, IRON, RETICCTPCT in the last 72  hours. Urine analysis:    Component Value Date/Time   COLORURINE YELLOW (A) 07/18/2020 1727   APPEARANCEUR CLOUDY (A) 07/18/2020 1727   APPEARANCEUR Clear 11/14/2019 1515   LABSPEC 1.009 07/18/2020 1727   PHURINE 6.0 07/18/2020 1727   GLUCOSEU NEGATIVE 07/18/2020 1727   HGBUR LARGE (A) 07/18/2020 1727   BILIRUBINUR NEGATIVE 07/18/2020 1727   BILIRUBINUR Negative 11/14/2019 1515   KETONESUR NEGATIVE 07/18/2020 1727   PROTEINUR 100 (A) 07/18/2020 1727   NITRITE NEGATIVE 07/18/2020 1727   LEUKOCYTESUR LARGE (A) 07/18/2020 1727   Recent Results (from the past 240 hour(s))  Resp Panel by RT-PCR (Flu A&B, Covid) Nasopharyngeal Swab     Status: None   Collection Time: 07/12/20  6:23 AM   Specimen: Nasopharyngeal Swab; Nasopharyngeal(NP) swabs in vial transport medium  Result Value Ref Range  Status   SARS Coronavirus 2 by RT PCR NEGATIVE NEGATIVE Final    Comment: (NOTE) SARS-CoV-2 target nucleic acids are NOT DETECTED.  The SARS-CoV-2 RNA is generally detectable in upper respiratory specimens during the acute phase of infection. The lowest concentration of SARS-CoV-2 viral copies this assay can detect is 138 copies/mL. A negative result does not preclude SARS-Cov-2 infection and should not be used as the sole basis for treatment or other patient management decisions. A negative result may occur with  improper specimen collection/handling, submission of specimen other than nasopharyngeal swab, presence of viral mutation(s) within the areas targeted by this assay, and inadequate number of viral copies(<138 copies/mL). A negative result must be combined with clinical observations, patient history, and epidemiological information. The expected result is Negative.  Fact Sheet for Patients:  EntrepreneurPulse.com.au  Fact Sheet for Healthcare Providers:  IncredibleEmployment.be  This test is no t yet approved or cleared by the [place] FDA and   has been authorized for detection and/or diagnosis of SARS-CoV-2 by FDA under an Emergency Use Authorization (EUA). This EUA will remain  in effect (meaning this test can be used) for the duration of the COVID-19 declaration under Section 564(b)(1) of the Act, 21 U.S.C.section 360bbb-3(b)(1), unless the authorization is terminated  or revoked sooner.       Influenza A by PCR NEGATIVE NEGATIVE Final   Influenza B by PCR NEGATIVE NEGATIVE Final    Comment: (NOTE) The Xpert Xpress SARS-CoV-2/FLU/RSV plus assay is intended as an aid in the diagnosis of influenza from Nasopharyngeal swab specimens and should not be used as a sole basis for treatment. Nasal washings and aspirates are unacceptable for Xpert Xpress SARS-CoV-2/FLU/RSV testing.  Fact Sheet for Patients: EntrepreneurPulse.com.au  Fact Sheet for Healthcare Providers: IncredibleEmployment.be  This test is not yet approved or cleared by the [place] FDA and has been authorized for detection and/or diagnosis of SARS-CoV-2 by FDA under an Emergency Use Authorization (EUA). This EUA will remain in effect (meaning this test can be used) for the duration of the COVID-19 declaration under Section 564(b)(1) of the Act, 21 U.S.C. section 360bbb-3(b)(1), unless the authorization is terminated or revoked.  Performed at [facility], [address], [place], [place] [address]       Radiology Studies: US Venous Img Lower Bilateral  Result Date: 07/18/2020 CLINICAL DATA:  Lower extremity swelling and pain EXAM: BILATERAL LOWER EXTREMITY VENOUS DOPPLER ULTRASOUND TECHNIQUE: Gray-scale sonography with compression, as well as color and duplex ultrasound, were performed to evaluate the deep venous system(s) from the level of the common femoral vein through the popliteal and proximal calf veins. COMPARISON:  None. FINDINGS: VENOUS Normal compressibility of the common femoral, superficial  femoral, and popliteal veins, as well as the visualized calf veins. Visualized portions of profunda femoral vein and great saphenous vein unremarkable. No filling defects to suggest DVT on grayscale or color Doppler imaging. Doppler waveforms show normal direction of venous flow, normal respiratory plasticity and response to augmentation. OTHER None. Limitations: none IMPRESSION: 1. No evidence of deep venous thrombosis within either lower extremity. Electronically Signed   By: Randa Ngo M.D.   On: 07/18/2020 17:00    Scheduled Meds: . carvedilol  6.25 mg Oral BID WC  . citric acid-potassium citrate  10 mL Oral TID  . cloNIDine  0.1 mg Oral BID  . enoxaparin (LOVENOX) injection  40 mg Subcutaneous Q24H  . fluticasone  2 spray Each Nare Daily  . gabapentin  200 mg Oral  QHS  . hydrALAZINE  100 mg Oral TID  . levothyroxine  88 mcg Oral QAC breakfast  . losartan  100 mg Oral Daily  . montelukast  10 mg Oral Daily  . multivitamin with minerals   Oral Daily  . pantoprazole  40 mg Oral Daily  . pravastatin  40 mg Oral QHS  . tamsulosin  0.4 mg Oral QPC supper   Continuous Infusions: . sodium chloride    . cefTRIAXone (ROCEPHIN)  IV 1 g (07/19/20 0917)     LOS: 0 days   Time spent: 25 minutes.  Patrecia Pour, MD [facility] [email or web address] 07/19/2020, 4:07 PM

## 2020-07-19 NOTE — Progress Notes (Signed)
Performed In and Out cath for retention per MD order, 650 ml of urine returned, sent sample off for labs.

## 2020-07-20 ENCOUNTER — Inpatient Hospital Stay: Payer: Medicare Other

## 2020-07-20 DIAGNOSIS — N179 Acute kidney failure, unspecified: Secondary | ICD-10-CM

## 2020-07-20 LAB — RENAL FUNCTION PANEL
Albumin: 2.9 g/dL — ABNORMAL LOW (ref 3.5–5.0)
Anion gap: 9 (ref 5–15)
BUN: 35 mg/dL — ABNORMAL HIGH (ref 8–23)
CO2: 28 mmol/L (ref 22–32)
Calcium: 10.1 mg/dL (ref 8.9–10.3)
Chloride: 98 mmol/L (ref 98–111)
Creatinine, Ser: 1.64 mg/dL — ABNORMAL HIGH (ref 0.44–1.00)
GFR, Estimated: 33 mL/min — ABNORMAL LOW (ref >60)
Glucose, Bld: 157 mg/dL — ABNORMAL HIGH (ref 70–99)
Phosphorus: 2.4 mg/dL — ABNORMAL LOW (ref 2.5–4.6)
Potassium: 3.8 mmol/L (ref 3.5–5.1)
Sodium: 135 mmol/L (ref 135–145)

## 2020-07-20 LAB — SARS CORONAVIRUS 2 (TAT 6-24 HRS): SARS Coronavirus 2: NEGATIVE

## 2020-07-20 MED ORDER — SODIUM CHLORIDE 0.9 % IV SOLN
500.0000 mg | Freq: Two times a day (BID) | INTRAVENOUS | Status: DC
  Administered 2020-07-20 – 2020-07-21 (×2): 500 mg via INTRAVENOUS
  Filled 2020-07-20: qty 500
  Filled 2020-07-20 (×2): qty 0.5

## 2020-07-20 NOTE — Progress Notes (Signed)
Patient refused her 1400 dose of heparin sq.  She states that she only takes this once daily.  I educated her and her son about heparin and how often it is administered and she continued to refuse.  MD notified.

## 2020-07-20 NOTE — Progress Notes (Addendum)
PROGRESS NOTE  Stacey [name]  SNK:[id number] DOB: [birth year] DOA: 07/18/2020 PCP: [name], MD   Brief Narrative: Stacey [name] is a 75 y.o. female with a history of dilated CM, chronic combined HFrEF, HTN, HLD, stage IIIa CKD, hypothyroidism who was recently discharged with foley catheter for urinary retention and returned 5/1 with suprapubic pain. Urinalysis suggests UTI, WBC elevated. Foley catheter was discontinued and the patient has been unable to void significantly since that time. Renal function has worsened. Renal U/S is ordered, showed some mild right hydronephrosis with ~650cc in bladder with absent ureteral jets, as well as renal cysts and left kidney focus for which MRI is ordered. She continues to retain urine but refuses foley catheter. Urine culture has grown GNR's, ceftriaxone was continued with speciation pending. Citrobacter freundii has since been identified, so meropenem was started.  Assessment & Plan: Principal Problem:   UTI (urinary tract infection) Active Problems:   Essential hypertension   Benign essential hypertension   Gastroesophageal reflux disease without esophagitis   Acquired hypothyroidism   CKD (chronic kidney disease), stage IIIa   Chronic obstructive pulmonary disease (HCC)   Dysuria   H/O urinary retention   Hyponatremia   AKI (acute kidney injury) ([place])  CAUTI: WBC improved with abx. Pain significantly improved with abx and removal of foley.  - Received ceftriaxone 1g q24h with follow up UA continuing to appear grossly infected. Initial urine culture growing Citrobacter freundii which is only 66% susceptible to ceftriaxone regionally. Will switch to meropenem pending susceptibilities (hoping to avoid FQ). Continue to monitor urine culture data.  Acute urinary retention: Unclear precipitant.  - Continue flomax - Treat UTI - Serial bladder scans with I/O, declines foley.  Abnormal focus in left kidney on U/S:  - MR ordered for further work  up.   AKI on stage IIIa CKD: SCr up in the setting of stopping foley catheter. Since she's just been diuresed for heart failure, will not start IVF.  - Renal U/S w/mild right hydro, abesnt ureteral jets into distended bladder. - Trend BMP - Hold lasix  Chronic combined HFrEF: Admission for exacerbation 6/25 - 6/28, discharged in improved condition with resolution of hypoxia. LVEF 25%, G2DD, mod MR, mod TR.  - Holding lasix again today with AKI and refusing foley catheter.  Obesity: Estimated body mass index is 31.16 kg/m as calculated from the following:   Height as of this encounter: 4\' 9"  (1.448 m).   Weight as of this encounter: 65.3 kg.  DVT prophylaxis: Heparin w/wt 65kg, CrCl still < 25 ml/min Code Status: Full Family Communication: None at bedside Disposition Plan:  Status is: Inpatient.  Ongoing diagnostic testing needed not appropriate for outpatient work up and requires treatment for UTI pending urine culture and serial bladder scans.   Dispo: The patient is from: Home              Anticipated d/c is to: Home w/HHRN, PT, OT              Patient currently is not medically stable to d/c.   Difficult to place patient No  Consultants:   None  Procedures:   None  Antimicrobials:  Ceftriaxone > meropenem  Subjective: Happy to have voided ~100cc twice in the past 12 hours. Bladder scan still >999cc this AM. Refuses foley catheter. Would rather stay for work up, UTI treatment, and serial bladder scans than DC with foley. +suprapubic tenderness and pain. No fever.  Objective: Vitals:   07/19/20  1933 07/19/20 2310 07/20/20 0313 07/20/20 0730  BP: (!) 113/58 128/66 (!) 98/55 (!) 112/52  Pulse: 73 83 66 69  Resp: 20 (!) 24 16 16   Temp: 98.6 F (37 C) 98 F (36.7 C) 97.8 F (36.6 C) 98.2 F (36.8 C)  TempSrc:    Oral  SpO2: 98% 98% 95% 98%  Weight:      Height:        Intake/Output Summary (Last 24 hours) at 07/20/2020 1049 Last data filed at 07/20/2020 0755 Gross  per 24 hour  Intake 100.52 ml  Output 950 ml  Net -849.48 ml   Filed Weights   07/18/20 1549  Weight: 65.3 kg   Gen: Pleasant older female in no distress Pulm: Nonlabored breathing room air. Clear. CV: Regular rate and rhythm. No murmur, rub, or gallop. No JVD, no dependent edema. GI: Abdomen soft, lower abdominal tenderness limiting exam (withdraws) without peritoneal signs, palpable bladder, otherwise non-distended, with normoactive bowel sounds.  Ext: Warm, no deformities Skin: No rashes, lesions or ulcers on visualized skin. Neuro: Alert and oriented. No focal neurological deficits. Psych: Judgement and insight appear fair. Mood euthymic & affect congruent. Behavior is appropriate.    Data Reviewed: I have personally reviewed following labs and imaging studies  CBC: Recent Labs  Lab 07/15/20 0555 07/18/20 1553 07/19/20 0508  WBC 6.3 11.5* 9.5  NEUTROABS 4.1  --   --   HGB 12.2 13.0 12.0  HCT 37.6 38.8 36.1  MCV 86.2 83.1 84.5  PLT 210 235 0000000   Basic Metabolic Panel: Recent Labs  Lab 07/14/20 0612 07/15/20 0555 07/18/20 1553 07/19/20 0508 07/20/20 0436  NA 139 139 130* 133* 135  K 4.0 3.6 3.3* 3.6 3.8  CL 105 104 95* 98 98  CO2 25 26 23 26 28   GLUCOSE 106* 130* 121* 159* 157*  BUN 36* 32* 28* 30* 35*  CREATININE 1.59* 1.37* 1.41* 1.82* 1.64*  CALCIUM 10.7* 10.6* 10.4* 9.6 10.1  PHOS  --   --   --   --  2.4*   GFR: Estimated Creatinine Clearance: 24.1 mL/min (A) (by C-G formula based on SCr of 1.64 mg/dL (H)). Liver Function Tests: Recent Labs  Lab 07/18/20 1553 07/20/20 0436  AST 31  --   ALT 27  --   ALKPHOS 65  --   BILITOT 0.8  --   PROT 7.7  --   ALBUMIN 3.6 2.9*   Recent Labs  Lab 07/18/20 1553  LIPASE 88*   No results for input(s): AMMONIA in the last 168 hours. Coagulation Profile: No results for input(s): INR, PROTIME in the last 168 hours. Cardiac Enzymes: No results for input(s): CKTOTAL, CKMB, CKMBINDEX, TROPONINI in the last  168 hours. BNP (last 3 results) No results for input(s): PROBNP in the last 8760 hours. HbA1C: No results for input(s): HGBA1C in the last 72 hours. CBG: No results for input(s): GLUCAP in the last 168 hours. Lipid Profile: No results for input(s): CHOL, HDL, LDLCALC, TRIG, CHOLHDL, LDLDIRECT in the last 72 hours. Thyroid Function Tests: No results for input(s): TSH, T4TOTAL, FREET4, T3FREE, THYROIDAB in the last 72 hours. Anemia Panel: No results for input(s): VITAMINB12, FOLATE, FERRITIN, TIBC, IRON, RETICCTPCT in the last 72 hours. Urine analysis:    Component Value Date/Time   COLORURINE YELLOW (A) 07/19/2020 1910   APPEARANCEUR CLOUDY (A) 07/19/2020 1910   APPEARANCEUR Clear 11/14/2019 1515   LABSPEC 1.010 07/19/2020 1910   PHURINE 5.0 07/19/2020 1910   GLUCOSEU NEGATIVE  07/19/2020 1910   HGBUR SMALL (A) 07/19/2020 1910   BILIRUBINUR NEGATIVE 07/19/2020 1910   BILIRUBINUR Negative 11/14/2019 [place] 07/19/2020 1910   PROTEINUR 100 (A) 07/19/2020 1910   NITRITE NEGATIVE 07/19/2020 1910   LEUKOCYTESUR LARGE (A) 07/19/2020 1910   Recent Results (from the past 240 hour(s))  Resp Panel by RT-PCR (Flu A&B, Covid) Nasopharyngeal Swab     Status: None   Collection Time: 07/12/20  6:23 AM   Specimen: Nasopharyngeal Swab; Nasopharyngeal(NP) swabs in vial transport medium  Result Value Ref Range Status   SARS Coronavirus 2 by RT PCR NEGATIVE NEGATIVE Final    Comment: (NOTE) SARS-CoV-2 target nucleic acids are NOT DETECTED.  The SARS-CoV-2 RNA is generally detectable in upper respiratory specimens during the acute phase of infection. The lowest concentration of SARS-CoV-2 viral copies this assay can detect is 138 copies/mL. A negative result does not preclude SARS-Cov-2 infection and should not be used as the sole basis for treatment or other patient management decisions. A negative result may occur with  improper specimen collection/handling, submission of  specimen other than nasopharyngeal swab, presence of viral mutation(s) within the areas targeted by this assay, and inadequate number of viral copies(<138 copies/mL). A negative result must be combined with clinical observations, patient history, and epidemiological information. The expected result is Negative.  Fact Sheet for Patients:  EntrepreneurPulse.com.au  Fact Sheet for Healthcare Providers:  IncredibleEmployment.be  This test is no t yet approved or cleared by the [place] FDA and  has been authorized for detection and/or diagnosis of SARS-CoV-2 by FDA under an Emergency Use Authorization (EUA). This EUA will remain  in effect (meaning this test can be used) for the duration of the COVID-19 declaration under Section 564(b)(1) of the Act, 21 U.S.C.section 360bbb-3(b)(1), unless the authorization is terminated  or revoked sooner.       Influenza A by PCR NEGATIVE NEGATIVE Final   Influenza B by PCR NEGATIVE NEGATIVE Final    Comment: (NOTE) The Xpert Xpress SARS-CoV-2/FLU/RSV plus assay is intended as an aid in the diagnosis of influenza from Nasopharyngeal swab specimens and should not be used as a sole basis for treatment. Nasal washings and aspirates are unacceptable for Xpert Xpress SARS-CoV-2/FLU/RSV testing.  Fact Sheet for Patients: EntrepreneurPulse.com.au  Fact Sheet for Healthcare Providers: IncredibleEmployment.be  This test is not yet approved or cleared by the [place] FDA and has been authorized for detection and/or diagnosis of SARS-CoV-2 by FDA under an Emergency Use Authorization (EUA). This EUA will remain in effect (meaning this test can be used) for the duration of the COVID-19 declaration under Section 564(b)(1) of the Act, 21 U.S.C. section 360bbb-3(b)(1), unless the authorization is terminated or revoked.  Performed at [facility], [address] [place], [place] [address]   Urine culture     Status: Abnormal (Preliminary result)   Collection Time: 07/18/20  5:27 PM   Specimen: Urine, Random  Result Value Ref Range Status   Specimen Description   Final    URINE, RANDOM Performed at [facility], [address] [place], [place] [address]    Special Requests   Final    NONE Performed at [facility], [place], [place] [address]    Culture >=100,000 COLONIES/mL GRAM NEGATIVE RODS (A)  Final   Report Status PENDING  Incomplete  SARS CORONAVIRUS 2 (TAT 6-24 HRS) Nasopharyngeal Nasopharyngeal Swab     Status: None   Collection Time: 07/19/20  11:52 AM   Specimen: Nasopharyngeal Swab  Result Value Ref Range Status   SARS Coronavirus 2 NEGATIVE NEGATIVE Final    Comment: (NOTE) SARS-CoV-2 target nucleic acids are NOT DETECTED.  The SARS-CoV-2 RNA is generally detectable in upper and lower respiratory specimens during the acute phase of infection. Negative results do not preclude SARS-CoV-2 infection, do not rule out co-infections with other pathogens, and should not be used as the sole basis for treatment or other patient management decisions. Negative results must be combined with clinical observations, patient history, and epidemiological information. The expected result is Negative.  Fact Sheet for Patients: SugarRoll.be  Fact Sheet for Healthcare Providers: https://www.woods-mathews.com/  This test is not yet approved or cleared by the [place] FDA and  has been authorized for detection and/or diagnosis of SARS-CoV-2 by FDA under an Emergency Use Authorization (EUA). This EUA will remain  in effect (meaning this test can be used) for the duration of the COVID-19 declaration under Se ction 564(b)(1) of the Act, 21 U.S.C. section 360bbb-3(b)(1), unless the authorization is terminated or revoked sooner.  Performed at [facility], [place] [address] [place], [place] [address]       Radiology Studies: US RENAL  Result Date: 07/20/2020 CLINICAL DATA:  Acute renal injury.  Urinary retention. EXAM: RENAL / URINARY TRACT ULTRASOUND COMPLETE COMPARISON:  Ultrasound 01/14/2018.  CT 07/27/2010. FINDINGS: Right Kidney: Renal measurements: 7.9 x 4.6 x 4.6 cm = volume: 88.1 mL. Echogenicity within normal limits. No mass. Mild hydronephrosis visualized. Left Kidney: Renal measurements: 8.1 x 4.6 x 4.8 cm = volume: 92.5 mL. Echogenicity within normal limits. 5 mm nonobstructing renal calyceal stone. 1.0 Cm and 0.6 cm simple cysts. 9 mm hypoechoic focus with questionable internal echoes. Hydronephrosis visualized. Bladder: Bladder may be slightly distended with a volume of 646.3 cc. Patient unable to void. Ureteral jets not visualized. Other: Fibroid uterus again noted. IMPRESSION: 1.  Mild right hydronephrosis. 2.  5 mm nonobstructing left renal calyceal stone. 3. Two simple left renal cysts, the largest measuring 1 cm. 9 mm hypoechoic focus with questionable internal echoes noted in the left kidney. MRI of the kidneys can be obtained to further evaluate. 4. Bladder may be slightly distended with a volume of 646.3 cc. Patient unable to void. Ureteral jets not visualized. 5.  Fibroid uterus again noted. Electronically Signed   By: Marcello Moores  Register   On: 07/20/2020 07:10   US Venous Img Lower Bilateral  Result Date: 07/18/2020 CLINICAL DATA:  Lower extremity swelling and pain EXAM: BILATERAL LOWER EXTREMITY VENOUS DOPPLER ULTRASOUND TECHNIQUE: Gray-scale sonography with compression, as well as color and duplex ultrasound, were performed to evaluate the deep venous system(s) from the level of the common femoral vein through the popliteal and proximal calf veins. COMPARISON:  None. FINDINGS: VENOUS Normal compressibility of the common femoral, superficial femoral, and popliteal veins, as well as the visualized calf veins. Visualized portions of  profunda femoral vein and great saphenous vein unremarkable. No filling defects to suggest DVT on grayscale or color Doppler imaging. Doppler waveforms show normal direction of venous flow, normal respiratory plasticity and response to augmentation. OTHER None. Limitations: none IMPRESSION: 1. No evidence of deep venous thrombosis within either lower extremity. Electronically Signed   By: Randa Ngo M.D.   On: 07/18/2020 17:00    Scheduled Meds: . carvedilol  6.25 mg Oral BID WC  . citric acid-potassium citrate  10 mL Oral TID  . cloNIDine  0.1 mg Oral BID  .  fluticasone  2 spray Each Nare Daily  . gabapentin  200 mg Oral QHS  . heparin injection (subcutaneous)  5,000 Units Subcutaneous Q8H  . hydrALAZINE  100 mg Oral TID  . levothyroxine  88 mcg Oral QAC breakfast  . losartan  100 mg Oral Daily  . montelukast  10 mg Oral Daily  . multivitamin with minerals   Oral Daily  . pantoprazole  40 mg Oral Daily  . pravastatin  40 mg Oral QHS  . tamsulosin  0.4 mg Oral QPC supper   Continuous Infusions: . sodium chloride    . cefTRIAXone (ROCEPHIN)  IV 1 g (07/20/20 0933)     LOS: 1 day   Time spent: 25 minutes.  Patrecia Pour, MD [facility] [email or web address] 07/20/2020, 10:49 AM

## 2020-07-20 NOTE — TOC Progression Note (Signed)
Transition of Care [facility]) - Progression Note    Patient Details  Name: MONTINE HIGHT MRN: [id number] Date of Birth: [birth year]  [facility]) CM/SW Contact  Shelbie Hutching, RN Phone Number: 07/20/2020, 8:34 AM  Clinical Narrative:    Youth rolling walker ordered from [employer] and delivered to the patient's room yesterday.    Expected Discharge Plan: [place] Barriers to Discharge: Continued Medical Work up  Expected Discharge Plan and Services Expected Discharge Plan: [place]   Discharge Planning Services: CM Consult Post Acute Care Choice: [place] arrangements for the past 2 months: Single Family Home                 DME Arranged: Walker rolling DME Agency: [employer] Date DME Agency Contacted: 07/19/20 Time DME Agency Contacted: 1234 Representative spoke with at DME Agency: [place]: RN,PT,OT [place] Agency: [place] ([place]) Date [place]: 07/19/20 Time [place]: 1234 Representative spoke with at [place]: [place] ([place]) Interventions    Readmission Risk Interventions No flowsheet data found.

## 2020-07-20 NOTE — Consult Note (Signed)
Pharmacy Antibiotic Note  Stacey [name] is a 75 y.o. female admitted on 07/18/2020 with suprapubic pain. Patient recently discharged with foley catheter for urinary retention. Pharmacy has been consulted for meropenem dosing 5/1: Preliminary urine culture>=100,000 COLONIES/mL CITROBACTER KOSERI   Plan:  Meropenem 500 mg Q12H based on current renal function  Follow up culture susceptibilities  Monitor renal function daily   Height: 4\' 9"  (144.8 cm) Weight: 65.3 kg (144 lb) IBW/kg (Calculated) : 38.6  Temp (24hrs), Avg:98.1 F (36.7 C), Min:97.7 F (36.5 C), Max:98.6 F (37 C)  Recent Labs  Lab 07/14/20 0612 07/15/20 0555 07/18/20 1553 07/19/20 0508 07/20/20 0436  WBC  --  6.3 11.5* 9.5  --   CREATININE 1.59* 1.37* 1.41* 1.82* 1.64*    Estimated Creatinine Clearance: 24.1 mL/min (A) (by C-G formula based on SCr of 1.64 mg/dL (H)).    Allergies  Allergen Reactions  . Codeine Other (See Comments)    Other reaction(s): GI Intolerance Other Reaction: nausea   chest pain Nausea  Other reaction(s): GI Intolerance, Other (See Comments) Nausea GI Intolerance, nausea, chest pain   . Propoxyphene Other (See Comments)    Other Reaction: nausea and chest pain  . Alendronate     Other reaction(s): Other (See Comments) Chest pain  . Ciprofloxacin Rash  . Lisinopril     Other reaction(s): Cough  . Amlodipine Swelling  . Aspirin     Other reaction(s): Other (See Comments) Other reaction(s): Other (See Comments) Stomach hurt  . Methotrexate Hives  . Tramadol Nausea And Vomiting  . Vicodin [Hydrocodone-Acetaminophen] Nausea And Vomiting    Antimicrobials this admission: 5/1 ceftriaxone  >> 5/3 5/3 meropenem >>    Microbiology results: 5/1 UCx: >=100,000 COLONIES/mL CITROBACTER KOSERI    Thank you for allowing pharmacy to be a part of this patient's care.  Dorothe Pea, PharmD, BCPS 07/20/2020 7:12 PM

## 2020-07-20 NOTE — Progress Notes (Signed)
Physical Therapy Treatment Patient Details Name: Stacey [name] MRN: [id number] DOB: [birth year] Today's Date: 07/20/2020    History of Present Illness Pt is a 75 y/o F who presents to the ED for evaluation of LLE pain, at the medial aspect of her knee, and lower abdominal cramping. Pt recently d/c from hospital following treatment for CHF exacerbation & sent home with foley catheter. Labratory evaluation concerning for significant UTI. PMH: HTN, thyroid disease    PT Comments    Patient agreeable to PT session. Received sitting up in recliner. YRW delivered and was adjusted properly for her height. Patient performed sit to stand with min guard. Ambulated in room with RW and min guard, 25 feet.  Patient declined walking further, wanted to go to the bathroom.  No lob. Slow, steady pace. Patient will continue to benefit from skilled PT to improve functional independence and safety.       Follow Up Recommendations  Home health PT;Supervision for mobility/OOB     Equipment Recommendations       Recommendations for Other Services       Precautions / Restrictions Precautions Precautions: Fall Restrictions Weight Bearing Restrictions: No    Mobility  Bed Mobility               General bed mobility comments: not assessed, patient received in recliner    Transfers Overall transfer level: Needs assistance Equipment used: Rolling walker (2 wheeled) Transfers: Sit to/from Stand Sit to Stand: Min guard            Ambulation/Gait Ambulation/Gait assistance: Min guard Gait Distance (Feet): 30 Feet Assistive device: Rolling walker (2 wheeled) Gait Pattern/deviations: Decreased step length - right;Decreased step length - left;Decreased stride length Gait velocity: decreased   General Gait Details: patient ambulates with slow, steady pace. Adjusted her walker to take home.   Stairs             Wheelchair Mobility    Modified Rankin (Stroke Patients Only)        Balance Overall balance assessment: Modified Independent Sitting-balance support: Feet supported Sitting balance-Leahy Scale: Normal     Standing balance support: Bilateral upper extremity supported;During functional activity Standing balance-Leahy Scale: Fair Standing balance comment: requires BUE support                            Cognition Arousal/Alertness: Awake/alert Behavior During Therapy: WFL for tasks assessed/performed Overall Cognitive Status: Within Functional Limits for tasks assessed                                        Exercises      General Comments        Pertinent Vitals/Pain Pain Assessment: No/denies pain Faces Pain Scale: Hurts a little bit Pain Location: knee Pain Descriptors / Indicators: Discomfort;Grimacing Pain Intervention(s): Monitored during session    Home Living                      Prior Function            PT Goals (current goals can now be found in the care plan section) Acute Rehab PT Goals Patient Stated Goal: get better PT Goal Formulation: With patient Time For Goal Achievement: 08/02/20 Potential to Achieve Goals: Good Progress towards PT goals: Progressing toward goals    Frequency  Min 2X/week      PT Plan Current plan remains appropriate    Co-evaluation              AM-PAC PT "6 Clicks" Mobility   Outcome Measure  Help needed turning from your back to your side while in a flat bed without using bedrails?: None Help needed moving from lying on your back to sitting on the side of a flat bed without using bedrails?: A Little Help needed moving to and from a bed to a chair (including a wheelchair)?: A Little Help needed standing up from a chair using your arms (e.g., wheelchair or bedside chair)?: A Little Help needed to walk in hospital room?: A Little Help needed climbing 3-5 steps with a railing? : A Little 6 Click Score: 19    End of Session Equipment  Utilized During Treatment: Gait belt Activity Tolerance: Patient tolerated treatment well Patient left: in chair;with chair alarm set;with call bell/phone within reach Nurse Communication: Mobility status PT Visit Diagnosis: Unsteadiness on feet (R26.81);Muscle weakness (generalized) (M62.81);Difficulty in walking, not elsewhere classified (R26.2)     Time: 9628-3662 PT Time Calculation (min) (ACUTE ONLY): 13 min  Charges:  $Gait Training: 8-22 mins                     Cheyla Duchemin, PT, GCS 07/20/20,10:23 AM

## 2020-07-21 LAB — URINE CULTURE
Culture: >=100000 — AB
Culture: NO GROWTH

## 2020-07-21 LAB — GLUCOSE, CAPILLARY: Glucose-Capillary: 128 mg/dL — ABNORMAL HIGH (ref 70–99)

## 2020-07-21 MED ORDER — CEPHALEXIN 500 MG PO CAPS
500.0000 mg | ORAL_CAPSULE | Freq: Three times a day (TID) | ORAL | Status: DC
  Administered 2020-07-21 – 2020-07-22 (×4): 500 mg via ORAL
  Filled 2020-07-21 (×4): qty 1

## 2020-07-21 NOTE — Progress Notes (Signed)
Physical Therapy Treatment Patient Details Name: Stacey [name] MRN: [id number] DOB: 1945/05/23 Today's Date: 07/21/2020    History of Present Illness Pt is a 75 y/o F who presents to the ED for evaluation of LLE pain, at the medial aspect of her knee, and lower abdominal cramping. Pt recently d/c from hospital following treatment for CHF exacerbation & sent home with foley catheter. Labratory evaluation concerning for significant UTI. PMH: HTN, thyroid disease    PT Comments    Patient received in bed, had just washed up in bathroom independently. Patient is agreeable to PT. States she is feeing good and wants to go home. She is independent with bed mobility, transfers and ambulated with min guard/supervision 150 feet with RW. Patient will continue to benefit from skilled PT while here to improve endurance and strength.     Follow Up Recommendations  Home health PT;Supervision for mobility/OOB     Equipment Recommendations  Rolling walker with 5" wheels    Recommendations for Other Services       Precautions / Restrictions Precautions Precaution Comments: low fall Restrictions Weight Bearing Restrictions: No    Mobility  Bed Mobility Overal bed mobility: Modified Independent Bed Mobility: Supine to Sit     Supine to sit: HOB elevated     General bed mobility comments: patient performed without assist    Transfers Overall transfer level: Independent   Transfers: Sit to/from Stand Sit to Stand: Independent         General transfer comment: patient standing when I returned from getting mask  Ambulation/Gait   Gait Distance (Feet): 150 Feet Assistive device: Rolling walker (2 wheeled) Gait Pattern/deviations: Decreased stride length;Step-through pattern Gait velocity: decreased   General Gait Details: patient ambulates with slow, steady pace. No safety concerns   Stairs             Wheelchair Mobility    Modified Rankin (Stroke Patients Only)        Balance Overall balance assessment: Modified Independent Sitting-balance support: Feet supported Sitting balance-Leahy Scale: Normal Sitting balance - Comments: supervision sitting EOB   Standing balance support: Bilateral upper extremity supported;During functional activity Standing balance-Leahy Scale: Good Standing balance comment: requires BUE support                            Cognition Arousal/Alertness: Awake/alert Behavior During Therapy: WFL for tasks assessed/performed Overall Cognitive Status: Within Functional Limits for tasks assessed                                        Exercises Other Exercises Other Exercises: Educated to perform LAQ sitting in recliner, heel slides    General Comments        Pertinent Vitals/Pain Pain Assessment: Faces Faces Pain Scale: Hurts a little bit Pain Location: bladder area Pain Descriptors / Indicators: Discomfort    Home Living                      Prior Function            PT Goals (current goals can now be found in the care plan section) Acute Rehab PT Goals Patient Stated Goal: go home today PT Goal Formulation: With patient Time For Goal Achievement: 08/02/20 Potential to Achieve Goals: Good Progress towards PT goals: Progressing toward goals    Frequency  Min 2X/week      PT Plan Current plan remains appropriate    Co-evaluation              AM-PAC PT "6 Clicks" Mobility   Outcome Measure  Help needed turning from your back to your side while in a flat bed without using bedrails?: None Help needed moving from lying on your back to sitting on the side of a flat bed without using bedrails?: None Help needed moving to and from a bed to a chair (including a wheelchair)?: None Help needed standing up from a chair using your arms (e.g., wheelchair or bedside chair)?: None Help needed to walk in hospital room?: A Little Help needed climbing 3-5 steps with  a railing? : A Little 6 Click Score: 22    End of Session   Activity Tolerance: Patient tolerated treatment well Patient left: in chair;with call bell/phone within reach;with chair alarm set Nurse Communication: Mobility status PT Visit Diagnosis: Muscle weakness (generalized) (M62.81)     Time: 1130-1144 PT Time Calculation (min) (ACUTE ONLY): 14 min  Charges:  $Gait Training: 8-22 mins                     Arrionna Serena, PT, GCS 07/21/20,11:52 AM

## 2020-07-21 NOTE — Progress Notes (Signed)
PROGRESS NOTE    Stacey [name]  DDU:[id number] DOB: 03-11-1946 DOA: 07/18/2020 PCP: [name], MD   Chief Complaint  Patient presents with  . Leg Pain  . Abdominal Pain  Brief Narrative: 51 female with history of dilated CM, chronic combined HFrEF, hypertension, hyperlipidemia, CKD stage III, hypothyroidism who recently got discharged with Foley catheter for urinary retention and return 5/1 with suprapubic pain he is tested UTI had leukocytosis patient was admitted on IV antibiotics for UTI.  Foley catheter was discontinued but she has been unable to void. [facility]orsen renal ultrasound was ordered showed some mild right hydronephrosis.Also renal cyst on left kidney focus for which MRI was ordered.  Patient was kept on voiding trial and antibiotics. Urine culture came back Citrobacter sensitive to Keflex.  Subjective: Seen this morning resting.  Has been voiding multiple times but having postvoid residual   Assessment & Plan:  CAUTI:With Citrobacter koseri from 07/18/20.  Patient sensitivity antibiotic changed to Keflex. Currently afebrile no leukocytosis. Urine cx 5/2 is negative.  Acute urine retention:Unclear precipitant.  She is on Flomax.Treating underlying UTI.  Continue serial bladder scan voiding trial.  And and out catheter and will likely need Foley catheter reinsertion.  Will monitor overnight. She has urology follow up coming Monday for the same.   Abnormal focus in the left kidney on ultrasound, MRI done today-the lesion of concern on prior ultrasound measures 6 x 7 x 8 mm in the left kidney and has a high T2 signal but not visible on T1-weighted images although this is ascribed to severe motion artifact and tiny size of the lesion is gesticulate this is malignancy most likely to be a small minimally complex cyst.  Incidentally noted 9 mm cystic lesion in the tail of the pancreas possibly to include intraductal papillary mucinous neoplasm or postinflammatory cystic lesion will  need MRI in 2 years, this has been communicated with the patient and patient's son at the bedside to have a follow-up with PCP plus minus GI.  Benign essential hypertension: Blood pressure is stable, on multiple regimen with hydralazine, losartan clonidine and Coreg.  AKI on CKD stage IIIa: Creatinine now downtrending hold Lasix monitor renal function Recent Labs  Lab 07/15/20 0555 07/18/20 1553 07/19/20 0508 07/20/20 0436  BUN 32* 28* 30* 35*  CREATININE 1.37* 1.41* 1.82* 1.64*   Chronic combined CHF: Recent admission 6/25-6/28.  LVEF 25% G2 DD moderate MR and moderate TR. Lasix on hold due to AKI hopefully resume soon  GERD continue PPI  Hyponatremia: Sodium has improved Recent Labs  Lab 07/15/20 0555 07/18/20 1553 07/19/20 0508 07/20/20 0436  NA 139 130* 133* 135   Morbid obesity with BMI 31.  Would benefit with weight loss  Diet Order            Diet Heart Room service appropriate? Yes; Fluid consistency: Thin  Diet effective now                Patient's Body mass index is 31.16 kg/m. DVT prophylaxis: heparin injection 5,000 Units Start: 07/19/20 1800 Place TED hose Start: 07/18/20 1916 Code Status:   Code Status: Full Code  Family Communication: plan of care discussed with patient at bedside.  Status is: Inpatient R room Leukosilk: Patient with emains inpatient appropriate because:Inpatient level of care appropriate due to severity of illness Dispo: The patient is from: Home              Anticipated d/c is to: Home likely tomorrow with home  health RN PT OT              Patient currently is not medically stable to d/c.   Difficult to place patient No Unresulted Labs (From admission, onward)         None    Medications reviewed:  Scheduled Meds: . carvedilol  6.25 mg Oral BID WC  . cephALEXin  500 mg Oral Q8H  . citric acid-potassium citrate  10 mL Oral TID  . cloNIDine  0.1 mg Oral BID  . fluticasone  2 spray Each Nare Daily  . gabapentin  200 mg Oral  QHS  . heparin injection (subcutaneous)  5,000 Units Subcutaneous Q8H  . hydrALAZINE  100 mg Oral TID  . levothyroxine  88 mcg Oral QAC breakfast  . losartan  100 mg Oral Daily  . montelukast  10 mg Oral Daily  . multivitamin with minerals   Oral Daily  . pantoprazole  40 mg Oral Daily  . pravastatin  40 mg Oral QHS  . tamsulosin  0.4 mg Oral QPC supper   Continuous Infusions: . sodium chloride      Consultants:see note  Procedures:see note  Antimicrobials: Anti-infectives (From admission, onward)   Start     Dose/Rate Route Frequency Ordered Stop   07/21/20 1400  cephALEXin (KEFLEX) capsule 500 mg        500 mg Oral Every 8 hours 07/21/20 1159     07/20/20 2000  meropenem (MERREM) 500 mg in sodium chloride 0.9 % 100 mL IVPB  Status:  Discontinued        500 mg 200 mL/hr over 30 Minutes Intravenous Every 12 hours 07/20/20 1800 07/21/20 1159   07/19/20 1000  cefTRIAXone (ROCEPHIN) 1 g in sodium chloride 0.9 % 100 mL IVPB  Status:  Discontinued        1 g 200 mL/hr over 30 Minutes Intravenous Every 24 hours 07/18/20 1855 07/20/20 1636   07/18/20 1845  cefTRIAXone (ROCEPHIN) 1 g in sodium chloride 0.9 % 100 mL IVPB        1 g 200 mL/hr over 30 Minutes Intravenous  Once 07/18/20 1841 07/18/20 1949   07/18/20 1830  piperacillin-tazobactam (ZOSYN) IVPB 3.375 g  Status:  Discontinued        3.375 g 100 mL/hr over 30 Minutes Intravenous  Once 07/18/20 1815 07/18/20 1840     Culture/Microbiology    Component Value Date/Time   SDES  07/19/2020 1910    URINE, RANDOM Performed at [facility], [address] [place], [place] [address]    [facility]  07/19/2020 1910    NONE Performed at [facility], [address] [place], [place] [address]    [place]  07/19/2020 1910    NO GROWTH Performed at [facility], [place] [address], [place], [place] [address]    REPTSTATUS 07/21/2020 FINAL 07/19/2020 1910    Other culture-see  note  Objective: Vitals: Today's Vitals   07/21/20 0003 07/21/20 0414 07/21/20 0738 07/21/20 1133  BP:  129/76 138/80 (!) 146/67  Pulse:  70 66 74  Resp:  18 18 17   Temp:  98 F (36.7 C) 98.1 F (36.7 C) 98 F (36.7 C)  TempSrc:  Oral    SpO2:  99% 97% 98%  Weight:      Height:      PainSc: 0-No pain  0-No pain     Intake/Output Summary (Last 24 hours) at 07/21/2020 1504 Last data filed at 07/21/2020 1348 Gross  per 24 hour  Intake 480 ml  Output 600 ml  Net -120 ml   Filed Weights   07/18/20 1549  Weight: 65.3 kg   Weight change:   Intake/Output from previous day: 05/03 0701 - 05/04 0700 In: -  Out: 1750 [Urine:1750] Intake/Output this shift: Total I/O In: 480 [P.O.:480] Out: 200 [Urine:200] Filed Weights   07/18/20 1549  Weight: 65.3 kg    Examination: General exam: AAO x3, older than stated age, weak appearing. HEENT:Oral mucosa moist, Ear/Nose WNL grossly,dentition normal. Respiratory system: bilaterally diminished, no crackles,no use of accessory muscle, non tender. Cardiovascular system: S1 & S2 +,No JVD. Gastrointestinal system: Abdomen soft, NT,ND, BS+. Nervous System:Alert, awake, moving extremities Extremities: no edema, distal peripheral pulses palpable.  Skin: No rashes,no icterus. MSK: Normal muscle bulk,tone, power  Data Reviewed: I have personally reviewed following labs and imaging studies CBC: Recent Labs  Lab 07/15/20 0555 07/18/20 1553 07/19/20 0508  WBC 6.3 11.5* 9.5  NEUTROABS 4.1  --   --   HGB 12.2 13.0 12.0  HCT 37.6 38.8 36.1  MCV 86.2 83.1 84.5  PLT 210 235 010   Basic Metabolic Panel: Recent Labs  Lab 07/15/20 0555 07/18/20 1553 07/19/20 0508 07/20/20 0436  NA 139 130* 133* 135  K 3.6 3.3* 3.6 3.8  CL 104 95* 98 98  CO2 26 23 26 28   GLUCOSE 130* 121* 159* 157*  BUN 32* 28* 30* 35*  CREATININE 1.37* 1.41* 1.82* 1.64*  CALCIUM 10.6* 10.4* 9.6 10.1  PHOS  --   --   --  2.4*   GFR: Estimated Creatinine  Clearance: 24.1 mL/min (A) (by C-G formula based on SCr of 1.64 mg/dL (H)). Liver Function Tests: Recent Labs  Lab 07/18/20 1553 07/20/20 0436  AST 31  --   ALT 27  --   ALKPHOS 65  --   BILITOT 0.8  --   PROT 7.7  --   ALBUMIN 3.6 2.9*   Recent Labs  Lab 07/18/20 1553  LIPASE 88*   No results for input(s): AMMONIA in the last 168 hours. Coagulation Profile: No results for input(s): INR, PROTIME in the last 168 hours. Cardiac Enzymes: No results for input(s): CKTOTAL, CKMB, CKMBINDEX, TROPONINI in the last 168 hours. BNP (last 3 results) No results for input(s): PROBNP in the last 8760 hours. HbA1C: No results for input(s): HGBA1C in the last 72 hours. CBG: Recent Labs  Lab 07/21/20 0738  GLUCAP 128*   Lipid Profile: No results for input(s): CHOL, HDL, LDLCALC, TRIG, CHOLHDL, LDLDIRECT in the last 72 hours. Thyroid Function Tests: No results for input(s): TSH, T4TOTAL, FREET4, T3FREE, THYROIDAB in the last 72 hours. Anemia Panel: No results for input(s): VITAMINB12, FOLATE, FERRITIN, TIBC, IRON, RETICCTPCT in the last 72 hours. Sepsis Labs: No results for input(s): PROCALCITON, LATICACIDVEN in the last 168 hours.  Recent Results (from the past 240 hour(s))  Resp Panel by RT-PCR (Flu A&B, Covid) Nasopharyngeal Swab     Status: None   Collection Time: 07/12/20  6:23 AM   Specimen: Nasopharyngeal Swab; Nasopharyngeal(NP) swabs in vial transport medium  Result Value Ref Range Status   SARS Coronavirus 2 by RT PCR NEGATIVE NEGATIVE Final    Comment: (NOTE) SARS-CoV-2 target nucleic acids are NOT DETECTED.  The SARS-CoV-2 RNA is generally detectable in upper respiratory specimens during the acute phase of infection. The lowest concentration of SARS-CoV-2 viral copies this assay can detect is 138 copies/mL. A negative result does not preclude SARS-Cov-2 infection and  should not be used as the sole basis for treatment or other patient management decisions. A negative  result may occur with  improper specimen collection/handling, submission of specimen other than nasopharyngeal swab, presence of viral mutation(s) within the areas targeted by this assay, and inadequate number of viral copies(<138 copies/mL). A negative result must be combined with clinical observations, patient history, and epidemiological information. The expected result is Negative.  Fact Sheet for Patients:  EntrepreneurPulse.com.au  Fact Sheet for Healthcare Providers:  IncredibleEmployment.be  This test is no t yet approved or cleared by the [place] FDA and  has been authorized for detection and/or diagnosis of SARS-CoV-2 by FDA under an Emergency Use Authorization (EUA). This EUA will remain  in effect (meaning this test can be used) for the duration of the COVID-19 declaration under Section 564(b)(1) of the Act, 21 U.S.C.section 360bbb-3(b)(1), unless the authorization is terminated  or revoked sooner.       Influenza A by PCR NEGATIVE NEGATIVE Final   Influenza B by PCR NEGATIVE NEGATIVE Final    Comment: (NOTE) The Xpert Xpress SARS-CoV-2/FLU/RSV plus assay is intended as an aid in the diagnosis of influenza from Nasopharyngeal swab specimens and should not be used as a sole basis for treatment. Nasal washings and aspirates are unacceptable for Xpert Xpress SARS-CoV-2/FLU/RSV testing.  Fact Sheet for Patients: EntrepreneurPulse.com.au  Fact Sheet for Healthcare Providers: IncredibleEmployment.be  This test is not yet approved or cleared by the [place] FDA and has been authorized for detection and/or diagnosis of SARS-CoV-2 by FDA under an Emergency Use Authorization (EUA). This EUA will remain in effect (meaning this test can be used) for the duration of the COVID-19 declaration under Section 564(b)(1) of the Act, 21 U.S.C. section 360bbb-3(b)(1), unless the authorization is  terminated or revoked.  Performed at [facility], [address] [place], [place] [address]   Urine culture     Status: Abnormal   Collection Time: 07/18/20  5:27 PM   Specimen: Urine, Random  Result Value Ref Range Status   Specimen Description   Final    URINE, RANDOM Performed at [facility], [address] [place], [place] [address]    Special Requests   Final    NONE Performed at [facility], [address] [place],  [address]    Culture >=100,000 COLONIES/mL CITROBACTER KOSERI (A)  Final   Report Status 07/21/2020 FINAL  Final   Organism ID, Bacteria CITROBACTER KOSERI (A)  Final      Susceptibility   Citrobacter koseri - MIC*    CEFAZOLIN <=4 SENSITIVE Sensitive     CEFEPIME <=0.12 SENSITIVE Sensitive     CEFTRIAXONE <=0.25 SENSITIVE Sensitive     CIPROFLOXACIN <=0.25 SENSITIVE Sensitive     GENTAMICIN <=1 SENSITIVE Sensitive     IMIPENEM <=0.25 SENSITIVE Sensitive     NITROFURANTOIN 32 SENSITIVE Sensitive     TRIMETH/SULFA <=20 SENSITIVE Sensitive     PIP/TAZO <=4 SENSITIVE Sensitive     * >=100,000 COLONIES/mL CITROBACTER KOSERI  SARS CORONAVIRUS 2 (TAT 6-24 HRS) Nasopharyngeal Nasopharyngeal Swab     Status: None   Collection Time: 07/19/20 11:52 AM   Specimen: Nasopharyngeal Swab  Result Value Ref Range Status   SARS Coronavirus 2 NEGATIVE NEGATIVE Final    Comment: (NOTE) SARS-CoV-2 target nucleic acids are NOT DETECTED.  The SARS-CoV-2 RNA is generally detectable in upper and lower respiratory specimens during the acute phase of infection. Negative results do not preclude SARS-CoV-2 infection, do  not rule out co-infections with other pathogens, and should not be used as the sole basis for treatment or other patient management decisions. Negative results must be combined with clinical observations, patient history, and epidemiological information. The expected result is Negative.  Fact Sheet for  Patients: SugarRoll.be  Fact Sheet for Healthcare Providers: https://www.woods-mathews.com/  This test is not yet approved or cleared by the [place] FDA and  has been authorized for detection and/or diagnosis of SARS-CoV-2 by FDA under an Emergency Use Authorization (EUA). This EUA will remain  in effect (meaning this test can be used) for the duration of the COVID-19 declaration under Se ction 564(b)(1) of the Act, 21 U.S.C. section 360bbb-3(b)(1), unless the authorization is terminated or revoked sooner.  Performed at [facility], [address] [place], [place] [address]   Urine Culture     Status: None   Collection Time: 07/19/20  7:10 PM   Specimen: Urine, Random  Result Value Ref Range Status   Specimen Description   Final    URINE, RANDOM Performed at [facility], [address], [place], [place] [address]    Special Requests   Final    NONE Performed at [facility], [address], [place], [place] [address]    Culture   Final    NO GROWTH Performed at [facility], [place] [address], [place],  [address]    Report Status 07/21/2020 FINAL  Final     Radiology Studies: MR ABDOMEN WO CONTRAST  Result Date: 07/21/2020 CLINICAL DATA:  Cystic right renal mass. EXAM: MRI ABDOMEN WITHOUT CONTRAST TECHNIQUE: Multiplanar multisequence MR imaging was performed without the administration of intravenous contrast. COMPARISON:  Renal ultrasound 07/20/20 FINDINGS: Despite efforts by the technologist and patient, motion artifact is present on today's exam and could not be eliminated. This reduces exam sensitivity and specificity. This is a common result when MRI abdomen is attempted in the inpatient setting where patients are less likely to be able to breath hold and cooperate in controlling motion. Lower chest: Linear and hazy opacities in the lower lobes likely combination of atelectasis and potentially  some low-grade edema or alveolitis. Mild cardiomegaly. Hepatobiliary: Cholecystectomy. Common bile duct 0.8 cm in diameter, mildly dilated but likely a physiologic response to cholecystectomy. No significant focal hepatic lesion is identified. Pancreas: 0.9 by 0.9 cm cystic lesion along the tail the pancreas, image 16 series 5. Enhancement characteristics are not assessed. Otherwise unremarkable. Spleen:  Unremarkable Adrenals/Urinary Tract:  Both adrenal glands appear normal. Bilateral very small T2 hyperintense lesions of the kidneys are present. The mid kidney cystic lesion described as potentially having minimal internal echoes on the prior ultrasound probably reflects the 6 by 7 by 8 mm T2 hyperintense lesion on image 10 of series 3; this lesion is blurred by motion on the T1 weighted images and hyperintense on the T2 weighted images. Enhancement characteristics are not assessed. Stomach/Bowel: Descending colon diverticulosis. Vascular/Lymphatic:  Unremarkable Other: Suspected multiple uterine fibroids, although the uterus is only seen at the margin of coronal images. Distended urinary bladder. Musculoskeletal: Unremarkable IMPRESSION: 1. The lesion of concern on prior ultrasound measures 6 by 7 by 8 mm in the left mid kidney, and has high T2 signal. The lesion is essentially not visible on T1 weighted images although this is ascribed to severe motion artifact and tiny size of the lesion. Enhancement characteristics are unknown as contrast was not administered. Statistically this small lesion is most likely to be  a small minimally complex cyst based on the ultrasound appearance. The patient is unable to breath hold and accordingly multiple series are severely degraded by motion artifact. 2. Mild cardiomegaly with hazy opacities in the lower lobes probably a combination of atelectasis and potentially some low-grade edema or alveolitis. 3. 9 mm cystic lesion in the tail the pancreas. Possibilities include  intraductal papillary mucinous neoplasm or postinflammatory cystic lesion. Given the size of the lesion and the patient's age, follow up pancreatic protocol MRI is recommended in 2 years time. This recommendation follows ACR consensus guidelines: Management of Incidental Pancreatic Cysts: A White Paper of the ACR Incidental Findings Committee. [place] [id number]. Electronically Signed   By: Van Clines M.D.   On: 07/21/2020 07:37   US RENAL  Result Date: 07/20/2020 CLINICAL DATA:  Acute renal injury.  Urinary retention. EXAM: RENAL / URINARY TRACT ULTRASOUND COMPLETE COMPARISON:  Ultrasound 01/14/2018.  CT 07/27/2010. FINDINGS: Right Kidney: Renal measurements: 7.9 x 4.6 x 4.6 cm = volume: 88.1 mL. Echogenicity within normal limits. No mass. Mild hydronephrosis visualized. Left Kidney: Renal measurements: 8.1 x 4.6 x 4.8 cm = volume: 92.5 mL. Echogenicity within normal limits. 5 mm nonobstructing renal calyceal stone. 1.0 Cm and 0.6 cm simple cysts. 9 mm hypoechoic focus with questionable internal echoes. Hydronephrosis visualized. Bladder: Bladder may be slightly distended with a volume of 646.3 cc. Patient unable to void. Ureteral jets not visualized. Other: Fibroid uterus again noted. IMPRESSION: 1.  Mild right hydronephrosis. 2.  5 mm nonobstructing left renal calyceal stone. 3. Two simple left renal cysts, the largest measuring 1 cm. 9 mm hypoechoic focus with questionable internal echoes noted in the left kidney. MRI of the kidneys can be obtained to further evaluate. 4. Bladder may be slightly distended with a volume of 646.3 cc. Patient unable to void. Ureteral jets not visualized. 5.  Fibroid uterus again noted. Electronically Signed   By: Marcello Moores  Register   On: 07/20/2020 07:10     LOS: 2 days   Antonieta Pert, MD [facility]  07/21/2020, 3:04 PM

## 2020-07-22 LAB — BASIC METABOLIC PANEL
Anion gap: 8 (ref 5–15)
BUN: 21 mg/dL (ref 8–23)
CO2: 27 mmol/L (ref 22–32)
Calcium: 11.3 mg/dL — ABNORMAL HIGH (ref 8.9–10.3)
Chloride: 103 mmol/L (ref 98–111)
Creatinine, Ser: 1.31 mg/dL — ABNORMAL HIGH (ref 0.44–1.00)
GFR, Estimated: 43 mL/min — ABNORMAL LOW (ref >60)
Glucose, Bld: 140 mg/dL — ABNORMAL HIGH (ref 70–99)
Potassium: 4.9 mmol/L (ref 3.5–5.1)
Sodium: 138 mmol/L (ref 135–145)

## 2020-07-22 MED ORDER — CEPHALEXIN 500 MG PO CAPS: 500.0000 mg | ORAL_CAPSULE | Freq: Three times a day (TID) | ORAL | 0 refills | Status: AC

## 2020-07-22 NOTE — Care Management Important Message (Signed)
Important Message  Patient Details  Name: Stacey [name] MRN: [id number] Date of Birth: [birth year]   Medicare Important Message Given:  N/A - LOS <3 / Initial given by admissions     Stacey [name] 07/22/2020, 7:37 AM

## 2020-07-22 NOTE — Discharge Summary (Signed)
Physician Discharge Summary  Stacey [name] QPY:[id number] DOB: Apr 05, 1945 DOA: 07/18/2020  PCP: [name], MD  Admit date: 07/18/2020 Discharge date: 07/22/2020  Admitted From: home Disposition: Home  Recommendations for Outpatient Follow-up:  1. Follow up with PCP in 1-2 weeks, and with urology on Monday 2. Please obtain BMP/CBC in one week 3. Please follow up on the following pending results:  Home Health: Yes Equipment/Devices: none  Discharge Condition: Stable Code Status:   Code Status: Full Code Diet recommendation:  Diet Order            Diet - low sodium heart healthy           Diet Heart Room service appropriate? Yes; Fluid consistency: Thin  Diet effective now                  Brief/Interim Summary: 75 female with history of dilated CM, chronic combined HFrEF, hypertension, hyperlipidemia, CKD stage III, hypothyroidism who recently got discharged with Foley catheter for urinary retention and return 5/1 with suprapubic pain he is tested UTI had leukocytosis patient was admitted on IV antibiotics for UTI.  Foley catheter was discontinued but she has been unable to void. [facility] renal ultrasound was ordered showed some mild right hydronephrosis.Also renal cyst on left kidney focus for which MRI was ordered.  Patient was kept on voiding trial and antibiotics. Urine culture came back Citrobacter sensitive to Keflex. Unable to completely void having significant post void retention 750 ml today-needed Foley catheter reinsertion 5/5.  Discharge Diagnoses:  CAUTI:With Citrobacter koseri from 07/18/20.  Patient sensitivity antibiotic changed to Keflex. Currently afebrile no leukocytosis. Urine cx 5/2 is negative.  Acute urine retention:Unclear precipitant.  She is on Flomax.Treating underlying UTI.  Failed voiding trial having postvoid urinary retention and Foley reinserted 5/5 am.She has urology follow up coming Monday for the same.   Abnormal focus in the left kidney on  ultrasound, MRI done today-the lesion of concern on prior ultrasound measures 6 x 7 x 8 mm in the left kidney and has a high T2 signal but not visible on T1-weighted images although this is ascribed to severe motion artifact and tiny size of the lesion is gesticulate this is malignancy most likely to be a small minimally complex cyst.  Incidentally noted 9 mm cystic lesion in the tail of the pancreas possibly to include intraductal papillary mucinous neoplasm or postinflammatory cystic lesion will need MRI in 2 years, this has been communicated with the patient and patient's son at the bedside to have a follow-up with PCP plus minus GI.  Benign essential hypertension: Blood pressure is stable, on multiple regimen with hydralazine, losartan clonidine and Coreg.  AKI on CKD stage IIIa: Creatinine now downtrended at 1.3. resume Lasix on d/c. Recent Labs  Lab 07/18/20 1553 07/19/20 0508 07/20/20 0436 07/22/20 0500  BUN 28* 30* 35* 21  CREATININE 1.41* 1.82* 1.64* 1.31*   Chronic combined CHF: Recent admission 6/25-6/28.  LVEF 25% G2 DD moderate MR and moderate TR. Lasix on hold due to AKI hopefully resume soon  GERD continue PPI  Hyponatremia: Sodium has improved Recent Labs  Lab 07/18/20 1553 07/19/20 0508 07/20/20 0436 07/22/20 0500  NA 130* 133* 135 138   Morbid obesity with BMI 31.  Would benefit with weight loss  Consults:  none  Subjective: Alert, awake and oriented, not in distress. Having postvoid residual this morning.  Discharge Exam: Vitals:   07/22/20 0320 07/22/20 0849  BP: (!) 141/62 (!) 145/71  Pulse: 64 72  Resp: 20 18  Temp: (!) 97.5 F (36.4 C) 97.7 F (36.5 C)  SpO2: 97% 98%   General:Patient is alert, awake,not in acute distress. Cardiovascular:RRR, S1/S2 +,no rubs,no gallops. Respiratory:CTA bilaterally,no wheezing,no rhonchi. Abdominal: Soft,NT,ND,bowel sounds +. Extremities:No edema,no cyanosis.  Discharge Instructions  Discharge  Instructions    Diet - low sodium heart healthy   Complete by: As directed    Discharge instructions   Complete by: As directed    Please call call MD or return to ER for similar or worsening recurring problem that brought you to hospital or if any fever,nausea/vomiting,abdominal pain, uncontrolled pain, chest pain,  shortness of breath or any other alarming symptoms.  Follow-up with primary care doctor and for possible GI referral due to cystic pancreatic lesion seen on MRI  Follow-up with urology for renal lesion seen on ultrasound.  Please follow-up your doctor as instructed in a week time and call the office for appointment.  Please avoid alcohol, smoking, or any other illicit substance and maintain healthy habits including taking your regular medications as prescribed.  You were cared for by a hospitalist during your hospital stay. If you have any questions about your discharge medications or the care you received while you were in the hospital after you are discharged, you can call the unit and ask to speak with the hospitalist on call if the hospitalist that took care of you is not available.  Once you are discharged, your primary care physician will handle any further medical issues. Please note that NO REFILLS for any discharge medications will be authorized once you are discharged, as it is imperative that you return to your primary care physician (or establish a relationship with a primary care physician if you do not have one) for your aftercare needs so that they can reassess your need for medications and monitor your lab values   Increase activity slowly   Complete by: As directed      Allergies as of 07/22/2020      Reactions   Codeine Other (See Comments)   Other reaction(s): GI Intolerance Other Reaction: nausea   chest pain Nausea Other reaction(s): GI Intolerance, Other (See Comments) Nausea GI Intolerance, nausea, chest pain   Propoxyphene Other (See Comments)    Other Reaction: nausea and chest pain   Alendronate    Other reaction(s): Other (See Comments) Chest pain   Ciprofloxacin Rash   Lisinopril    Other reaction(s): Cough   Amlodipine Swelling   Aspirin    Other reaction(s): Other (See Comments) Other reaction(s): Other (See Comments) Stomach hurt   Methotrexate Hives   Tramadol Nausea And Vomiting   Vicodin [hydrocodone-acetaminophen] Nausea And Vomiting      Medication List    STOP taking these medications   HYDROcodone-acetaminophen 5-325 MG tablet Commonly known as: Norco     TAKE these medications   albuterol 108 (90 Base) MCG/ACT inhaler Commonly known as: Ventolin HFA Inhale 2 puffs into the lungs every 6 (six) hours as needed for wheezing or shortness of breath.   carvedilol 6.25 MG tablet Commonly known as: COREG Take 1 tablet (6.25 mg total) by mouth 2 (two) times daily with a meal.   cephALEXin 500 MG capsule Commonly known as: KEFLEX Take 1 capsule (500 mg total) by mouth every 8 (eight) hours for 7 days.   cetirizine 10 MG tablet Commonly known as: ZYRTEC Take 1 tablet (10 mg total) by mouth daily.   clobetasol ointment 0.05 %  Commonly known as: TEMOVATE Apply 1 application topically 2 (two) times daily as needed (rash).   cloNIDine 0.1 MG tablet Commonly known as: CATAPRES Take 1 tablet (0.1 mg total) by mouth 2 (two) times daily.   cyclobenzaprine 10 MG tablet Commonly known as: FLEXERIL Take 1 tablet (10 mg total) by mouth 3 (three) times daily as needed.   Flovent HFA 110 MCG/ACT inhaler Generic drug: fluticasone Inhale 1 puff into the lungs 2 (two) times daily.   fluticasone 50 MCG/ACT nasal spray Commonly known as: FLONASE Place 2 sprays into both nostrils daily.   furosemide 20 MG tablet Commonly known as: LASIX Take 2 tablets (40 mg total) by mouth daily.   gabapentin 100 MG capsule Commonly known as: NEURONTIN Take 2 capsules (200 mg total) by mouth at bedtime.   hydrALAZINE 50  MG tablet Commonly known as: APRESOLINE Take 2 tablets (100 mg total) by mouth 3 (three) times daily.   latanoprost 0.005 % ophthalmic solution Commonly known as: XALATAN Place 1 drop into both eyes at bedtime.   levothyroxine 88 MCG tablet Commonly known as: SYNTHROID Take 88 mcg by mouth daily before breakfast.   losartan 100 MG tablet Commonly known as: COZAAR Take 1 tablet (100 mg total) by mouth daily.   montelukast 10 MG tablet Commonly known as: SINGULAIR Take 1 tablet (10 mg total) by mouth daily.   Multivitamin Gummies [employer] 2 tablets by mouth daily.   ondansetron 4 MG disintegrating tablet Commonly known as: Zofran ODT Take 1 tablet (4 mg total) by mouth every 8 (eight) hours as needed.   pantoprazole 40 MG tablet Commonly known as: PROTONIX TAKE 1 TABLET BY MOUTH AS NEEDED What changed: when to take this   pravastatin 20 MG tablet Commonly known as: PRAVACHOL TAKE 1 TABLET(20 MG) BY MOUTH EVERY NIGHT What changed:   how much to take  how to take this  when to take this  additional instructions   Restasis 0.05 % ophthalmic emulsion Generic drug: cycloSPORINE Place 1 drop into both eyes daily.   timolol 0.25 % ophthalmic solution Commonly known as: TIMOPTIC Place 1 drop into both eyes 2 (two) times daily.            Durable Medical Equipment  (From admission, onward)         Start     Ordered   07/19/20 1236  For home use only DME Walker rolling  Once       Question Answer Comment  Walker: With 5 Inch Wheels   Patient needs a walker to treat with the following condition Weakness      07/19/20 1235          Follow-up Information    Lyndon Code, MD Follow up in 1 week(s).   Specialty: Internal Medicine Contact information: [address] [place] [address] [phone]        Urology Follow up.   Why: As schedule on Monday             Allergies  Allergen Reactions  . Codeine Other (See Comments)     Other reaction(s): GI Intolerance Other Reaction: nausea   chest pain Nausea  Other reaction(s): GI Intolerance, Other (See Comments) Nausea GI Intolerance, nausea, chest pain   . Propoxyphene Other (See Comments)    Other Reaction: nausea and chest pain  . Alendronate     Other reaction(s): Other (See Comments) Chest pain  . Ciprofloxacin Rash  . Lisinopril  Other reaction(s): Cough  . Amlodipine Swelling  . Aspirin     Other reaction(s): Other (See Comments) Other reaction(s): Other (See Comments) Stomach hurt  . Methotrexate Hives  . Tramadol Nausea And Vomiting  . Vicodin [Hydrocodone-Acetaminophen] Nausea And Vomiting    The results of significant diagnostics from this hospitalization (including imaging, microbiology, ancillary and laboratory) are listed below for reference.    Microbiology: Recent Results (from the past 240 hour(s))  Urine culture     Status: Abnormal   Collection Time: 07/18/20  5:27 PM   Specimen: Urine, Random  Result Value Ref Range Status   Specimen Description   Final    URINE, RANDOM Performed at [facility], [address], [place], [place] [address]    Special Requests   Final    NONE Performed at [facility], [place], [place] [address]    Culture >=100,000 COLONIES/mL CITROBACTER KOSERI (A)  Final   Report Status 07/21/2020 FINAL  Final   Organism ID, Bacteria CITROBACTER KOSERI (A)  Final      Susceptibility   Citrobacter koseri - MIC*    CEFAZOLIN <=4 SENSITIVE Sensitive     CEFEPIME <=0.12 SENSITIVE Sensitive     CEFTRIAXONE <=0.25 SENSITIVE Sensitive     CIPROFLOXACIN <=0.25 SENSITIVE Sensitive     GENTAMICIN <=1 SENSITIVE Sensitive     IMIPENEM <=0.25 SENSITIVE Sensitive     NITROFURANTOIN 32 SENSITIVE Sensitive     TRIMETH/SULFA <=20 SENSITIVE Sensitive     PIP/TAZO <=4 SENSITIVE Sensitive     * >=100,000 COLONIES/mL CITROBACTER KOSERI  SARS CORONAVIRUS 2 (TAT 6-24 HRS)  Nasopharyngeal Nasopharyngeal Swab     Status: None   Collection Time: 07/19/20 11:52 AM   Specimen: Nasopharyngeal Swab  Result Value Ref Range Status   SARS Coronavirus 2 NEGATIVE NEGATIVE Final    Comment: (NOTE) SARS-CoV-2 target nucleic acids are NOT DETECTED.  The SARS-CoV-2 RNA is generally detectable in upper and lower respiratory specimens during the acute phase of infection. Negative results do not preclude SARS-CoV-2 infection, do not rule out co-infections with other pathogens, and should not be used as the sole basis for treatment or other patient management decisions. Negative results must be combined with clinical observations, patient history, and epidemiological information. The expected result is Negative.  Fact Sheet for Patients: SugarRoll.be  Fact Sheet for Healthcare Providers: https://www.woods-mathews.com/  This test is not yet approved or cleared by the [place] FDA and  has been authorized for detection and/or diagnosis of SARS-CoV-2 by FDA under an Emergency Use Authorization (EUA). This EUA will remain  in effect (meaning this test can be used) for the duration of the COVID-19 declaration under Se ction 564(b)(1) of the Act, 21 U.S.C. section 360bbb-3(b)(1), unless the authorization is terminated or revoked sooner.  Performed at [facility], [place] [address] [place], [place] [address]   Urine Culture     Status: None   Collection Time: 07/19/20  7:10 PM   Specimen: Urine, Random  Result Value Ref Range Status   Specimen Description   Final    URINE, RANDOM Performed at [facility], [address], [place], [place] [address]    Special Requests   Final    NONE Performed at [facility], [address] [place], [place] [address]    Culture   Final    NO GROWTH Performed at [facility], [place] [address], [place], [place] [address]  Report Status 07/21/2020 FINAL   Final    Procedures/Studies: DG Chest 2 View  Result Date: 07/13/2020 CLINICAL DATA:  Shortness of breath EXAM: CHEST - 2 VIEW COMPARISON:  July 12, 2020 FINDINGS: Patchy airspace opacity noted in each lung base region. Heart is borderline enlarged with pulmonary vascularity normal. No adenopathy. There is aortic atherosclerosis. No bone lesions. IMPRESSION: Probable pneumonia in each lung base. Lungs otherwise clear. Borderline cardiac enlargement. Aortic Atherosclerosis (ICD10-I70.0). Electronically Signed   By: Lowella Grip III M.D.   On: 07/13/2020 15:22   MR ABDOMEN WO CONTRAST  Result Date: 07/21/2020 CLINICAL DATA:  Cystic right renal mass. EXAM: MRI ABDOMEN WITHOUT CONTRAST TECHNIQUE: Multiplanar multisequence MR imaging was performed without the administration of intravenous contrast. COMPARISON:  Renal ultrasound 07/20/20 FINDINGS: Despite efforts by the technologist and patient, motion artifact is present on today's exam and could not be eliminated. This reduces exam sensitivity and specificity. This is a common result when MRI abdomen is attempted in the inpatient setting where patients are less likely to be able to breath hold and cooperate in controlling motion. Lower chest: Linear and hazy opacities in the lower lobes likely combination of atelectasis and potentially some low-grade edema or alveolitis. Mild cardiomegaly. Hepatobiliary: Cholecystectomy. Common bile duct 0.8 cm in diameter, mildly dilated but likely a physiologic response to cholecystectomy. No significant focal hepatic lesion is identified. Pancreas: 0.9 by 0.9 cm cystic lesion along the tail the pancreas, image 16 series 5. Enhancement characteristics are not assessed. Otherwise unremarkable. Spleen:  Unremarkable Adrenals/Urinary Tract:  Both adrenal glands appear normal. Bilateral very small T2 hyperintense lesions of the kidneys are present. The mid kidney cystic lesion described as potentially having minimal internal  echoes on the prior ultrasound probably reflects the 6 by 7 by 8 mm T2 hyperintense lesion on image 10 of series 3; this lesion is blurred by motion on the T1 weighted images and hyperintense on the T2 weighted images. Enhancement characteristics are not assessed. Stomach/Bowel: Descending colon diverticulosis. Vascular/Lymphatic:  Unremarkable Other: Suspected multiple uterine fibroids, although the uterus is only seen at the margin of coronal images. Distended urinary bladder. Musculoskeletal: Unremarkable IMPRESSION: 1. The lesion of concern on prior ultrasound measures 6 by 7 by 8 mm in the left mid kidney, and has high T2 signal. The lesion is essentially not visible on T1 weighted images although this is ascribed to severe motion artifact and tiny size of the lesion. Enhancement characteristics are unknown as contrast was not administered. Statistically this small lesion is most likely to be a small minimally complex cyst based on the ultrasound appearance. The patient is unable to breath hold and accordingly multiple series are severely degraded by motion artifact. 2. Mild cardiomegaly with hazy opacities in the lower lobes probably a combination of atelectasis and potentially some low-grade edema or alveolitis. 3. 9 mm cystic lesion in the tail the pancreas. 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 Given the size of the lesion and the patient's age, follow up pancreatic protocol MRI is recommended in 2 years time. This recommendation follows ACR consensus guidelines: Management of Incidental Pancreatic Cysts: A White Paper of the ACR Incidental Findings Committee. [place] [id number]. Electronically Signed   By: Van Clines M.D.   On: 07/21/2020 07:37   US RENAL  Result Date: 07/20/2020 CLINICAL DATA:  Acute renal injury.  Urinary retention. EXAM: RENAL / URINARY TRACT ULTRASOUND COMPLETE COMPARISON:  Ultrasound 01/14/2018.  CT  07/27/2010. FINDINGS: Right  Kidney: Renal measurements: 7.9 x 4.6 x 4.6 cm = volume: 88.1 mL. Echogenicity within normal limits. No mass. Mild hydronephrosis visualized. Left Kidney: Renal measurements: 8.1 x 4.6 x 4.8 cm = volume: 92.5 mL. Echogenicity within normal limits. 5 mm nonobstructing renal calyceal stone. 1.0 Cm and 0.6 cm simple cysts. 9 mm hypoechoic focus with questionable internal echoes. Hydronephrosis visualized. Bladder: Bladder may be slightly distended with a volume of 646.3 cc. Patient unable to void. Ureteral jets not visualized. Other: Fibroid uterus again noted. IMPRESSION: 1.  Mild right hydronephrosis. 2.  5 mm nonobstructing left renal calyceal stone. 3. Two simple left renal cysts, the largest measuring 1 cm. 9 mm hypoechoic focus with questionable internal echoes noted in the left kidney. MRI of the kidneys can be obtained to further evaluate. 4. Bladder may be slightly distended with a volume of 646.3 cc. Patient unable to void. Ureteral jets not visualized. 5.  Fibroid uterus again noted. Electronically Signed   By: Marcello Moores  Register   On: 07/20/2020 07:10   US Venous Img Lower Bilateral  Result Date: 07/18/2020 CLINICAL DATA:  Lower extremity swelling and pain EXAM: BILATERAL LOWER EXTREMITY VENOUS DOPPLER ULTRASOUND TECHNIQUE: Gray-scale sonography with compression, as well as color and duplex ultrasound, were performed to evaluate the deep venous system(s) from the level of the common femoral vein through the popliteal and proximal calf veins. COMPARISON:  None. FINDINGS: VENOUS Normal compressibility of the common femoral, superficial femoral, and popliteal veins, as well as the visualized calf veins. Visualized portions of profunda femoral vein and great saphenous vein unremarkable. No filling defects to suggest DVT on grayscale or color Doppler imaging. Doppler waveforms show normal direction of venous flow, normal respiratory plasticity and response to augmentation. OTHER  None. Limitations: none IMPRESSION: 1. No evidence of deep venous thrombosis within either lower extremity. Electronically Signed   By: Randa Ngo M.D.   On: 07/18/2020 17:00   DG Chest Port 1 View  Result Date: 07/12/2020 CLINICAL DATA:  Shortness of breath.  History of COPD. EXAM: PORTABLE CHEST 1 VIEW COMPARISON:  Chest x-ray 01/12/2018, CT chest 04/26/2017, CT chest 10/04/2012, CT chest 04/26/2017 FINDINGS: Redemonstration of a enlarged cardiac silhouette. The heart size and mediastinal contours are unchanged. Aortic arch calcifications. Right middle lobe airspace opacity. Question retrocardiac opacity. Increased interstitial markings. No pleural effusion. No pneumothorax. No acute osseous abnormality. IMPRESSION: 1. Right middle lobe airspace opacities suggestive of infection or inflammation. Also question retrocardiac opacity. Followup PA and lateral chest X-ray is recommended in 3-4 weeks following therapy to ensure resolution and exclude underlying malignancy. 2. Mild pulmonary edema. 3.  Aortic Atherosclerosis (ICD10-I70.0). Electronically Signed   By: Iven Finn M.D.   On: 07/12/2020 06:55   ECHOCARDIOGRAM COMPLETE  Result Date: 07/12/2020    ECHOCARDIOGRAM REPORT   Patient Name:   ERIOLUWA VENTURINO Date of Exam: 07/12/2020 Medical Rec #:  [id number]      Height:       57.0 in Accession #:    [id number]     Weight:       150.0 lb Date of Birth:  01-17-1946     BSA:          1.592 m Patient Age:    25 years       BP:           169/107 mmHg Patient Gender: F              HR:  77 bpm. Exam Location:  [facility] Procedure: 2D Echo, Color Doppler, Cardiac Doppler and Strain Analysis Indications:     I50.31 CHF-Acute Diastolic  History:         Patient has prior history of Echocardiogram examinations. Risk                  Factors:Hypertension.  Sonographer:     Charmayne Sheer RDCS (AE) Referring Phys:  [id number] Soledad Gerlach NIU Diagnosing Phys: Serafina Royals MD  Sonographer Comments: Global longitudinal  strain was attempted. IMPRESSIONS  1. Left ventricular ejection fraction, by estimation, is 20 to 25%. The left ventricle has severely decreased function. The left ventricle demonstrates global hypokinesis. The left ventricular internal cavity size was mildly dilated. Left ventricular diastolic parameters are consistent with Grade II diastolic dysfunction (pseudonormalization).  2. Right ventricular systolic function is normal. The right ventricular size is normal.  3. The mitral valve is normal in structure. Mild mitral valve regurgitation.  4. The aortic valve is calcified. Aortic valve regurgitation is mild. Mild aortic valve stenosis. FINDINGS  Left Ventricle: Left ventricular ejection fraction, by estimation, is 20 to 25%. The left ventricle has severely decreased function. The left ventricle demonstrates global hypokinesis. The left ventricular internal cavity size was mildly dilated. There is no left ventricular hypertrophy. Left ventricular diastolic parameters are consistent with Grade II diastolic dysfunction (pseudonormalization). Right Ventricle: The right ventricular size is normal. No increase in right ventricular wall thickness. Right ventricular systolic function is normal. Left Atrium: Left atrial size was normal in size. Right Atrium: Right atrial size was normal in size. Pericardium: There is no evidence of pericardial effusion. Mitral Valve: The mitral valve is normal in structure. Mild mitral valve regurgitation. MV peak gradient, 6.0 mmHg. The mean mitral valve gradient is 2.0 mmHg. Tricuspid Valve: The tricuspid valve is normal in structure. Tricuspid valve regurgitation is mild. Aortic Valve: The aortic valve is calcified. Aortic valve regurgitation is mild. Aortic regurgitation PHT measures 582 msec. Mild aortic stenosis is present. Aortic valve mean gradient measures 6.0 mmHg. Aortic valve peak gradient measures 10.5 mmHg. Aortic valve area, by VTI measures 1.23 cm. Pulmonic Valve: The  pulmonic valve was normal in structure. Pulmonic valve regurgitation is not visualized. Aorta: The aortic root and ascending aorta are structurally normal, with no evidence of dilitation. IAS/Shunts: No atrial level shunt detected by color flow Doppler.  LEFT VENTRICLE PLAX 2D LVIDd:         5.20 cm  Diastology LVIDs:         4.20 cm  LV e' medial:    3.26 cm/s LV PW:         1.00 cm  LV E/e' medial:  24.0 LV IVS:        0.80 cm  LV e' lateral:   2.61 cm/s LVOT diam:     1.70 cm  LV E/e' lateral: 30.0 LV SV:         35 LV SV Index:   22 LVOT Area:     2.27 cm  RIGHT VENTRICLE RV Basal diam:  3.10 cm LEFT ATRIUM             Index       RIGHT ATRIUM          Index LA diam:        3.70 cm 2.32 cm/m  RA Area:     8.70 cm LA Vol (A2C):   30.0 ml 18.85 ml/m RA Volume:   13.00 ml 8.17  ml/m LA Vol (A4C):   48.5 ml 30.47 ml/m LA Biplane Vol: 39.6 ml 24.88 ml/m  AORTIC VALVE                    PULMONIC VALVE AV Area (Vmax):    1.42 cm     PV Vmax:       0.84 m/s AV Area (Vmean):   1.20 cm     PV Vmean:      50.100 cm/s AV Area (VTI):     1.23 cm     PV VTI:        0.128 m AV Vmax:           162.00 cm/s  PV Peak grad:  2.9 mmHg AV Vmean:          116.000 cm/s PV Mean grad:  1.0 mmHg AV VTI:            0.287 m AV Peak Grad:      10.5 mmHg AV Mean Grad:      6.0 mmHg LVOT Vmax:         101.00 cm/s LVOT Vmean:        61.400 cm/s LVOT VTI:          0.155 m LVOT/AV VTI ratio: 0.54 AI PHT:            582 msec  AORTA Ao Root diam: 2.70 cm MITRAL VALVE MV Area (PHT): 4.86 cm     SHUNTS MV Area VTI:   2.03 cm     Systemic VTI:  0.16 m MV Peak grad:  6.0 mmHg     Systemic Diam: 1.70 cm MV Mean grad:  2.0 mmHg MV Vmax:       1.22 m/s MV Vmean:      62.6 cm/s MV Decel Time: 156 msec MV E velocity: 78.40 cm/s MV A velocity: 111.00 cm/s MV E/A ratio:  0.71 Serafina Royals MD Electronically signed by Serafina Royals MD Signature Date/Time: 07/12/2020/5:06:07 PM    Final     Labs: BNP (last 3 results) Recent Labs     07/12/20 0623 07/18/20 1553  BNP 1,015.6* 97.3   Basic Metabolic Panel: Recent Labs  Lab 07/18/20 1553 07/19/20 0508 07/20/20 0436 07/22/20 0500  NA 130* 133* 135 138  K 3.3* 3.6 3.8 4.9  CL 95* 98 98 103  CO2 23 26 28 27   GLUCOSE 121* 159* 157* 140*  BUN 28* 30* 35* 21  CREATININE 1.41* 1.82* 1.64* 1.31*  CALCIUM 10.4* 9.6 10.1 11.3*  PHOS  --   --  2.4*  --    Liver Function Tests: Recent Labs  Lab 07/18/20 1553 07/20/20 0436  AST 31  --   ALT 27  --   ALKPHOS 65  --   BILITOT 0.8  --   PROT 7.7  --   ALBUMIN 3.6 2.9*   Recent Labs  Lab 07/18/20 1553  LIPASE 88*   No results for input(s): AMMONIA in the last 168 hours. CBC: Recent Labs  Lab 07/18/20 1553 07/19/20 0508  WBC 11.5* 9.5  HGB 13.0 12.0  HCT 38.8 36.1  MCV 83.1 84.5  PLT 235 193   Cardiac Enzymes: No results for input(s): CKTOTAL, CKMB, CKMBINDEX, TROPONINI in the last 168 hours. BNP: Invalid input(s): POCBNP CBG: Recent Labs  Lab 07/21/20 0738  GLUCAP 128*   D-Dimer No results for input(s): DDIMER in the last 72 hours. Hgb A1c No results for input(s): HGBA1C in the  last 72 hours. Lipid Profile No results for input(s): CHOL, HDL, LDLCALC, TRIG, CHOLHDL, LDLDIRECT in the last 72 hours. Thyroid function studies No results for input(s): TSH, T4TOTAL, T3FREE, THYROIDAB in the last 72 hours.  Invalid input(s): FREET3 Anemia work up No results for input(s): VITAMINB12, FOLATE, FERRITIN, TIBC, IRON, RETICCTPCT in the last 72 hours. Urinalysis    Component Value Date/Time   COLORURINE YELLOW (A) 07/19/2020 1910   APPEARANCEUR CLOUDY (A) 07/19/2020 1910   APPEARANCEUR Clear 11/14/2019 1515   LABSPEC 1.010 07/19/2020 1910   PHURINE 5.0 07/19/2020 1910   GLUCOSEU NEGATIVE 07/19/2020 1910   HGBUR SMALL (A) 07/19/2020 1910   BILIRUBINUR NEGATIVE 07/19/2020 1910   BILIRUBINUR Negative 11/14/2019 [place] 07/19/2020 1910   PROTEINUR 100 (A) 07/19/2020 1910   NITRITE  NEGATIVE 07/19/2020 1910   LEUKOCYTESUR LARGE (A) 07/19/2020 1910   Sepsis Labs Invalid input(s): PROCALCITONIN,  WBC,  LACTICIDVEN Microbiology Recent Results (from the past 240 hour(s))  Urine culture     Status: Abnormal   Collection Time: 07/18/20  5:27 PM   Specimen: Urine, Random  Result Value Ref Range Status   Specimen Description   Final    URINE, RANDOM Performed at [facility], [address] [place], [place] [address]    Special Requests   Final    NONE Performed at [facility], [address] [place], [place] [address]    Culture >=100,000 COLONIES/mL CITROBACTER KOSERI (A)  Final   Report Status 07/21/2020 FINAL  Final   Organism ID, Bacteria CITROBACTER KOSERI (A)  Final      Susceptibility   Citrobacter koseri - MIC*    CEFAZOLIN <=4 SENSITIVE Sensitive     CEFEPIME <=0.12 SENSITIVE Sensitive     CEFTRIAXONE <=0.25 SENSITIVE Sensitive     CIPROFLOXACIN <=0.25 SENSITIVE Sensitive     GENTAMICIN <=1 SENSITIVE Sensitive     IMIPENEM <=0.25 SENSITIVE Sensitive     NITROFURANTOIN 32 SENSITIVE Sensitive     TRIMETH/SULFA <=20 SENSITIVE Sensitive     PIP/TAZO <=4 SENSITIVE Sensitive     * >=100,000 COLONIES/mL CITROBACTER KOSERI  SARS CORONAVIRUS 2 (TAT 6-24 HRS) Nasopharyngeal Nasopharyngeal Swab     Status: None   Collection Time: 07/19/20 11:52 AM   Specimen: Nasopharyngeal Swab  Result Value Ref Range Status   SARS Coronavirus 2 NEGATIVE NEGATIVE Final    Comment: (NOTE) SARS-CoV-2 target nucleic acids are NOT DETECTED.  The SARS-CoV-2 RNA is generally detectable in upper and lower respiratory specimens during the acute phase of infection. Negative results do not preclude SARS-CoV-2 infection, do not rule out co-infections with other pathogens, and should not be used as the sole basis for treatment or other patient management decisions. Negative results must be combined with clinical observations, patient history, and  epidemiological information. The expected result is Negative.  Fact Sheet for Patients: SugarRoll.be  Fact Sheet for Healthcare Providers: https://www.woods-mathews.com/  This test is not yet approved or cleared by the [place] FDA and  has been authorized for detection and/or diagnosis of SARS-CoV-2 by FDA under an Emergency Use Authorization (EUA). This EUA will remain  in effect (meaning this test can be used) for the duration of the COVID-19 declaration under Se ction 564(b)(1) of the Act, 21 U.S.C. section 360bbb-3(b)(1), unless the authorization is terminated or revoked sooner.  Performed at [facility], [place] [address], [place], [place] [address]   Urine Culture     Status: None   Collection  Time: 07/19/20  7:10 PM   Specimen: Urine, Random  Result Value Ref Range Status   Specimen Description   Final    URINE, RANDOM Performed at [facility], [address], [place], [place] [address]    Special Requests   Final    NONE Performed at [facility], [address], [place], [place] [address]    Culture   Final    NO GROWTH Performed at [facility], [place] [address] [place], [place] [address]    Report Status 07/21/2020 FINAL  Final     Time coordinating discharge: 35 minutes  SIGNED: Antonieta Pert, MD  [facility] 07/22/2020, 10:17 AM  If 7PM-7AM, please contact night-coverage [email or web address]

## 2020-07-22 NOTE — TOC Transition Note (Signed)
Transition of Care [facility]) - CM/SW Discharge Note   Patient Details  Name: Stacey [name] MRN: [id number] Date of Birth: 1946/03/08  [facility] of [facility]) CM/SW Contact:  Shelbie Hutching, RN Phone Number: 07/22/2020, 11:43 AM   Clinical Narrative:    Patient medically cleared for discharge home today with home health services.  [facility] is aware of discharge and will see patient for , RN, PT, and OT HH.  Patient's family will transport her home.  RW youth delivered to room 2 days ago.    Final next level of care: [place] Barriers to Discharge: Barriers Resolved   Patient Goals and CMS Choice Patient states their goals for this hospitalization and ongoing recovery are:: To return home with home health services CMS Medicare.gov Compare Post Acute Care list provided to:: Patient Choice offered to / list presented to : Patient  Discharge Placement                       Discharge Plan and Services   Discharge Planning Services: CM Consult Post Acute Care Choice: Home Health          DME Arranged: Walker rolling DME Agency: [employer] Date DME Agency Contacted: 07/19/20 Time DME Agency Contacted: 1234 Representative spoke with at DME Agency: [place]: RN,PT,OT [place] Agency: [place] ([place]) Date [place]: 07/22/20 Time [place]: 1143 Representative spoke with at [place]: [place] ([place]) Interventions     Readmission Risk Interventions No flowsheet data found.

## 2020-07-22 NOTE — Progress Notes (Signed)
Occupational Therapy Treatment Patient Details Name: Stacey [name] MRN: [id number] DOB: 06-15-1945 Today's Date: 07/22/2020    History of present illness Pt is a 75 y/o F who presents to the ED for evaluation of LLE pain, at the medial aspect of her knee, and lower abdominal cramping. Pt recently d/c from hospital following treatment for CHF exacerbation & sent home with foley catheter. Labratory evaluation concerning for significant UTI. PMH: HTN, thyroid disease   OT comments  Stacey [name] was seen for OT treatment on this date. Upon arrival to room pt reclined in chair, agreeable to tx. Pt focused on increased urine output and to go home today. Pt completed x2 toilet t/fs with SUPERVISION + RW, including perihygiene and hand washing standing sinkside. Pt tolerated ~5 mins mobility and verbalized understanding of education on RW mgmt and pet care mgmt. Pt making good progress toward goals. Pt continues to benefit from skilled OT services to maximize return to PLOF and minimize risk of future falls, injury, caregiver burden, and readmission. Will continue to follow POC. Discharge recommendation updated to no OT follow up to reflect pt progress.    Follow Up Recommendations  No OT follow up    Equipment Recommendations  Other (comment) (2WW youth)    Recommendations for Other Services      Precautions / Restrictions Precautions Precaution Comments: low fall Restrictions Weight Bearing Restrictions: No       Mobility Bed Mobility               General bed mobility comments: received and left in chair    Transfers Overall transfer level: Modified independent Equipment used: Rolling walker (2 wheeled) Transfers: Sit to/from Stand Sit to Stand: Modified independent (Device/Increase time)              Balance Overall balance assessment: Modified Independent                                         ADL either performed or assessed with clinical judgement    ADL Overall ADL's : Needs assistance/impaired                                       General ADL Comments: SUPERVISION + RW for x2 toilet t/f, perihygiene, and hand washing standing sinkside. Pt tolerated ~5 mins mobility and verbalized understanding of education on RW mgmt and pet care mgmt.               Cognition Arousal/Alertness: Awake/alert Behavior During Therapy: WFL for tasks assessed/performed Overall Cognitive Status: Within Functional Limits for tasks assessed                                          Exercises Exercises: Other exercises Other Exercises Other Exercises: Pt edcuated re: OT role, DME recs, d/c recs, falls prevention, ECS, adapted dressing techniques, pet mgmt Other Exercises: Toileting x2, grooming, hand washing, tooth brushing, face washing, sit<>stand x2, sitting/standing balance/tolerance, ~200 ft mobility           Pertinent Vitals/ Pain       Pain Assessment: No/denies pain         Frequency  Min 1X/week  Progress Toward Goals  OT Goals(current goals can now be found in the care plan section)  Progress towards OT goals: Progressing toward goals  Acute Rehab OT Goals Patient Stated Goal: go home today OT Goal Formulation: With patient Time For Goal Achievement: 08/02/20 Potential to Achieve Goals: Good ADL Goals Pt Will Perform Grooming: standing;Independently Pt Will Perform Lower Body Dressing: with modified independence;sit to/from stand Pt Will Transfer to Toilet: with modified independence;ambulating;regular height toilet  Plan Frequency remains appropriate;Discharge plan needs to be updated       AM-PAC OT "6 Clicks" Daily Activity     Outcome Measure   Help from another person eating meals?: None Help from another person taking care of personal grooming?: A Little Help from another person toileting, which includes using toliet, bedpan, or urinal?: A Little Help from another  person bathing (including washing, rinsing, drying)?: A Little Help from another person to put on and taking off regular upper body clothing?: A Little Help from another person to put on and taking off regular lower body clothing?: A Little 6 Click Score: 19    End of Session Equipment Utilized During Treatment: Rolling walker  OT Visit Diagnosis: Muscle weakness (generalized) (M62.81);Unsteadiness on feet (R26.81)   Activity Tolerance Patient tolerated treatment well   Patient Left in chair;with call bell/phone within reach;with chair alarm set   Nurse Communication Other (comment) (183mL urine output)        Time: 8416-6063 OT Time Calculation (min): 28 min  Charges: OT General Charges $OT Visit: 1 Visit OT Treatments $Self Care/Home Management : 23-37 mins   Dessie Coma, M.S. OTR/L  07/22/20, 9:55 AM  ascom [phone]

## 2020-07-22 NOTE — Progress Notes (Signed)
Physical Therapy Treatment Patient Details Name: Stacey [name] MRN: [id number] DOB: 1946-01-22 Today's Date: 07/22/2020    History of Present Illness Pt is a 75 y/o F who presents to the ED for evaluation of LLE pain, at the medial aspect of her knee, and lower abdominal cramping. Pt recently d/c from hospital following treatment for CHF exacerbation & sent home with foley catheter. Labratory evaluation concerning for significant UTI. PMH: HTN, thyroid disease    PT Comments    Patient received in chair. States she is hopeful to go home today after cathter is placed. Patient is mod independent with transfers and supervision for ambulation with RW 300 feet. Patient will continue to benefit from skilled PT while here to ensure safe return home.       Follow Up Recommendations  Home health PT     Equipment Recommendations  None recommended by PT    Recommendations for Other Services       Precautions / Restrictions Precautions Precaution Comments: low fall Restrictions Weight Bearing Restrictions: No    Mobility  Bed Mobility               General bed mobility comments: received and left in chair    Transfers Overall transfer level: Modified independent Equipment used: Rolling walker (2 wheeled) Transfers: Sit to/from Stand Sit to Stand: Modified independent (Device/Increase time)            Ambulation/Gait Ambulation/Gait assistance: Supervision Gait Distance (Feet): 300 Feet Assistive device: Rolling walker (2 wheeled) Gait Pattern/deviations: Step-through pattern Gait velocity: WNL   General Gait Details: steady, improved pace this visit   Stairs             Wheelchair Mobility    Modified Rankin (Stroke Patients Only)       Balance Overall balance assessment: Modified Independent Sitting-balance support: Feet supported Sitting balance-Leahy Scale: Normal     Standing balance support: Bilateral upper extremity supported;During  functional activity Standing balance-Leahy Scale: Good Standing balance comment: requires BUE support                            Cognition Arousal/Alertness: Awake/alert Behavior During Therapy: WFL for tasks assessed/performed Overall Cognitive Status: Within Functional Limits for tasks assessed                                        Exercises Other Exercises Other Exercises: Pt edcuated re: OT role, DME recs, d/c recs, falls prevention, ECS, adapted dressing techniques, pet mgmt Other Exercises: Toileting x2, grooming, hand washing, tooth brushing, face washing, sit<>stand x2, sitting/standing balance/tolerance, ~200 ft mobility    General Comments        Pertinent Vitals/Pain Pain Assessment: No/denies pain    Home Living                      Prior Function            PT Goals (current goals can now be found in the care plan section) Acute Rehab PT Goals Patient Stated Goal: go home today PT Goal Formulation: With patient Time For Goal Achievement: 08/02/20 Potential to Achieve Goals: Good Progress towards PT goals: Progressing toward goals    Frequency           PT Plan Discharge plan needs to be updated    Co-evaluation  AM-PAC PT "6 Clicks" Mobility   Outcome Measure  Help needed turning from your back to your side while in a flat bed without using bedrails?: None Help needed moving from lying on your back to sitting on the side of a flat bed without using bedrails?: None Help needed moving to and from a bed to a chair (including a wheelchair)?: None Help needed standing up from a chair using your arms (e.g., wheelchair or bedside chair)?: None Help needed to walk in hospital room?: None Help needed climbing 3-5 steps with a railing? : None 6 Click Score: 24    End of Session   Activity Tolerance: Patient tolerated treatment well Patient left: in chair;with chair alarm set;with call bell/phone  within reach Nurse Communication: Mobility status       Time: 1120-1135 PT Time Calculation (min) (ACUTE ONLY): 15 min  Charges:  $Gait Training: 8-22 mins                     Matheu Ploeger, PT, GCS 07/22/20,11:58 AM

## 2020-07-22 NOTE — Progress Notes (Signed)
57fr foley catheter placed with clear yellow urine return.  Patient will d/c with foley and has f/u appointment with urology. Patient educated about catheter care and tolerated procedure without complaints.

## 2020-07-23 ENCOUNTER — Ambulatory Visit: Payer: Medicare Other | Admitting: Family

## 2020-07-23 DIAGNOSIS — T83511D Infection and inflammatory reaction due to indwelling urethral catheter, subsequent encounter: Secondary | ICD-10-CM | POA: Diagnosis not present

## 2020-07-23 DIAGNOSIS — J449 Chronic obstructive pulmonary disease, unspecified: Secondary | ICD-10-CM | POA: Diagnosis not present

## 2020-07-23 DIAGNOSIS — N1832 Chronic kidney disease, stage 3b: Secondary | ICD-10-CM | POA: Diagnosis not present

## 2020-07-23 DIAGNOSIS — N179 Acute kidney failure, unspecified: Secondary | ICD-10-CM | POA: Diagnosis not present

## 2020-07-23 DIAGNOSIS — Z466 Encounter for fitting and adjustment of urinary device: Secondary | ICD-10-CM | POA: Diagnosis not present

## 2020-07-23 DIAGNOSIS — D869 Sarcoidosis, unspecified: Secondary | ICD-10-CM | POA: Diagnosis not present

## 2020-07-23 DIAGNOSIS — N136 Pyonephrosis: Secondary | ICD-10-CM | POA: Diagnosis not present

## 2020-07-23 DIAGNOSIS — I130 Hypertensive heart and chronic kidney disease with heart failure and stage 1 through stage 4 chronic kidney disease, or unspecified chronic kidney disease: Secondary | ICD-10-CM | POA: Diagnosis not present

## 2020-07-23 DIAGNOSIS — B9689 Other specified bacterial agents as the cause of diseases classified elsewhere: Secondary | ICD-10-CM | POA: Diagnosis not present

## 2020-07-23 DIAGNOSIS — I420 Dilated cardiomyopathy: Secondary | ICD-10-CM | POA: Diagnosis not present

## 2020-07-23 DIAGNOSIS — I5043 Acute on chronic combined systolic (congestive) and diastolic (congestive) heart failure: Secondary | ICD-10-CM | POA: Diagnosis not present

## 2020-07-26 ENCOUNTER — Encounter: Payer: Self-pay | Admitting: Urology

## 2020-07-26 ENCOUNTER — Ambulatory Visit: Payer: Self-pay | Admitting: Urology

## 2020-07-26 ENCOUNTER — Other Ambulatory Visit: Payer: Self-pay

## 2020-07-26 ENCOUNTER — Ambulatory Visit (INDEPENDENT_AMBULATORY_CARE_PROVIDER_SITE_OTHER): Payer: Medicare Other | Admitting: Urology

## 2020-07-26 VITALS — BP 108/63 | HR 65 | Ht <= 58 in | Wt 146.0 lb

## 2020-07-26 DIAGNOSIS — N281 Cyst of kidney, acquired: Secondary | ICD-10-CM

## 2020-07-26 DIAGNOSIS — R339 Retention of urine, unspecified: Secondary | ICD-10-CM | POA: Diagnosis not present

## 2020-07-26 DIAGNOSIS — N390 Urinary tract infection, site not specified: Secondary | ICD-10-CM

## 2020-07-26 LAB — BLADDER SCAN AMB NON-IMAGING

## 2020-07-26 MED ORDER — SULFAMETHOXAZOLE-TRIMETHOPRIM 800-160 MG PO TABS: 1.0000 | ORAL_TABLET | Freq: Once | ORAL | Status: DC

## 2020-07-26 NOTE — Progress Notes (Signed)
   07/26/20 1:58 PM   Cindy Hazy [birth year] [id number]  CC: Urinary retention, UTI  HPI: Comorbid 75 year old female with multiple hospitalizations over the last 2 months for heart failure, urinary retention, and UTI.  I reviewed the hospital notes extensively.  She denies any problem with urination prior to these recent hospitalizations, specifically any incontinence, urgency/frequency, weak stream, or prior history of retention.  She denies any gross hematuria or flank pain.  She also had a small renal cyst which was further evaluated by MRI abdomen which showed no suspicious findings for malignancy.  Recent urine culture grew > 100 K Citrobacter, and she was treated with culture appropriate antibiotics.  Culture 5/2 was no growth.   PMH: Past Medical History:  Diagnosis Date  . Hypertension   . Thyroid disease     Surgical History: Past Surgical History:  Procedure Laterality Date  . CATARACT EXTRACTION    . EYE SURGERY Right 07/09/2019  . GALLBLADDER SURGERY    . KNEE SURGERY    . thyroidectomy      Family History: Family History  Problem Relation Age of Onset  . Hypertension Mother   . Diabetes Mother   . Anuerysm Mother   . Diabetes Father   . Hypertension Father   . Prostate cancer Neg Hx   . Bladder Cancer Neg Hx   . Kidney cancer Neg Hx     Social History:  reports that she has never smoked. She has never used smokeless tobacco. She reports previous drug use. She reports that she does not drink alcohol.  Physical Exam: BP 108/63   Pulse 65   Ht 4\' 9"  (1.448 m)   Wt 146 lb (66.2 kg)   BMI 31.59 kg/m    Constitutional:  Alert and oriented, No acute distress. Cardiovascular: No clubbing, cyanosis, or edema. Respiratory: Normal respiratory effort, no increased work of breathing. GI: Abdomen is soft, nontender, nondistended, no abdominal masses Foley with yellow urine  Laboratory Data: Reviewed  Pertinent Imaging: I have personally viewed and  interpreted the MRI abdomen 07/21/2020, see HPI  Assessment & Plan:   75 year old comorbid female with multiple hospitalizations over the last 6 weeks for congestive heart failure, complicated by urinary retention and UTI.  Foley was removed today, and Bactrim given for prophylaxis.  She has been able to void spontaneously throughout the day, and PVR this afternoon is normal at 119 mL.  Regarding her MRI findings, we discussed that this does not require further imaging or follow-up.  We reviewed return precautions at length.  RTC 6 months with PVR.  I spent 50 total minutes on the day of the encounter including pre-visit review of the medical record, face-to-face time with the patient, and post visit ordering of labs/imaging/tests.  Nickolas Madrid, MD 07/26/2020  [facility] [address] [place],  [address] [phone]

## 2020-07-27 ENCOUNTER — Other Ambulatory Visit: Payer: Self-pay | Admitting: Nurse Practitioner

## 2020-07-27 DIAGNOSIS — I10 Essential (primary) hypertension: Secondary | ICD-10-CM

## 2020-07-28 DIAGNOSIS — B9689 Other specified bacterial agents as the cause of diseases classified elsewhere: Secondary | ICD-10-CM | POA: Diagnosis not present

## 2020-07-28 DIAGNOSIS — Z466 Encounter for fitting and adjustment of urinary device: Secondary | ICD-10-CM | POA: Diagnosis not present

## 2020-07-28 DIAGNOSIS — I130 Hypertensive heart and chronic kidney disease with heart failure and stage 1 through stage 4 chronic kidney disease, or unspecified chronic kidney disease: Secondary | ICD-10-CM | POA: Diagnosis not present

## 2020-07-28 DIAGNOSIS — I420 Dilated cardiomyopathy: Secondary | ICD-10-CM | POA: Diagnosis not present

## 2020-07-28 DIAGNOSIS — J449 Chronic obstructive pulmonary disease, unspecified: Secondary | ICD-10-CM | POA: Diagnosis not present

## 2020-07-28 DIAGNOSIS — N179 Acute kidney failure, unspecified: Secondary | ICD-10-CM | POA: Diagnosis not present

## 2020-07-28 DIAGNOSIS — D869 Sarcoidosis, unspecified: Secondary | ICD-10-CM | POA: Diagnosis not present

## 2020-07-28 DIAGNOSIS — T83511D Infection and inflammatory reaction due to indwelling urethral catheter, subsequent encounter: Secondary | ICD-10-CM | POA: Diagnosis not present

## 2020-07-28 DIAGNOSIS — N1832 Chronic kidney disease, stage 3b: Secondary | ICD-10-CM | POA: Diagnosis not present

## 2020-07-28 DIAGNOSIS — N136 Pyonephrosis: Secondary | ICD-10-CM | POA: Diagnosis not present

## 2020-07-28 DIAGNOSIS — I5043 Acute on chronic combined systolic (congestive) and diastolic (congestive) heart failure: Secondary | ICD-10-CM | POA: Diagnosis not present

## 2020-07-29 ENCOUNTER — Encounter: Payer: Self-pay | Admitting: Family

## 2020-07-29 ENCOUNTER — Other Ambulatory Visit: Payer: Self-pay

## 2020-07-29 ENCOUNTER — Other Ambulatory Visit
Admission: RE | Admit: 2020-07-29 | Discharge: 2020-07-29 | Disposition: A | Discharge status: Home / Self Care | Payer: Medicare Other | Source: Ambulatory Visit | Attending: Family | Admitting: Family

## 2020-07-29 ENCOUNTER — Telehealth: Payer: Self-pay | Admitting: Family

## 2020-07-29 ENCOUNTER — Telehealth: Payer: Self-pay

## 2020-07-29 ENCOUNTER — Ambulatory Visit (HOSPITAL_BASED_OUTPATIENT_CLINIC_OR_DEPARTMENT_OTHER): Payer: Medicare Other | Admitting: Family

## 2020-07-29 VITALS — BP 163/68 | HR 63 | Resp 18 | Ht <= 58 in | Wt 147.2 lb

## 2020-07-29 DIAGNOSIS — E876 Hypokalemia: Secondary | ICD-10-CM

## 2020-07-29 DIAGNOSIS — R5383 Other fatigue: Secondary | ICD-10-CM | POA: Insufficient documentation

## 2020-07-29 DIAGNOSIS — I509 Heart failure, unspecified: Secondary | ICD-10-CM | POA: Insufficient documentation

## 2020-07-29 DIAGNOSIS — Z886 Allergy status to analgesic agent status: Secondary | ICD-10-CM | POA: Insufficient documentation

## 2020-07-29 DIAGNOSIS — E079 Disorder of thyroid, unspecified: Secondary | ICD-10-CM | POA: Insufficient documentation

## 2020-07-29 DIAGNOSIS — R42 Dizziness and giddiness: Secondary | ICD-10-CM | POA: Insufficient documentation

## 2020-07-29 DIAGNOSIS — Z8249 Family history of ischemic heart disease and other diseases of the circulatory system: Secondary | ICD-10-CM | POA: Insufficient documentation

## 2020-07-29 DIAGNOSIS — I10 Essential (primary) hypertension: Secondary | ICD-10-CM

## 2020-07-29 DIAGNOSIS — Z79899 Other long term (current) drug therapy: Secondary | ICD-10-CM | POA: Insufficient documentation

## 2020-07-29 DIAGNOSIS — I130 Hypertensive heart and chronic kidney disease with heart failure and stage 1 through stage 4 chronic kidney disease, or unspecified chronic kidney disease: Secondary | ICD-10-CM | POA: Insufficient documentation

## 2020-07-29 DIAGNOSIS — J449 Chronic obstructive pulmonary disease, unspecified: Secondary | ICD-10-CM | POA: Insufficient documentation

## 2020-07-29 DIAGNOSIS — I420 Dilated cardiomyopathy: Secondary | ICD-10-CM | POA: Diagnosis not present

## 2020-07-29 DIAGNOSIS — I5022 Chronic systolic (congestive) heart failure: Secondary | ICD-10-CM

## 2020-07-29 DIAGNOSIS — N189 Chronic kidney disease, unspecified: Secondary | ICD-10-CM | POA: Insufficient documentation

## 2020-07-29 LAB — CBC
HCT: 36.8 % (ref 36.0–46.0)
Hemoglobin: 11.8 g/dL — ABNORMAL LOW (ref 12.0–15.0)
MCH: 27.6 pg (ref 26.0–34.0)
MCHC: 32.1 g/dL (ref 30.0–36.0)
MCV: 86 fL (ref 80.0–100.0)
Platelets: 315 10*3/uL (ref 150–400)
RBC: 4.28 MIL/uL (ref 3.87–5.11)
RDW: 14.3 % (ref 11.5–15.5)
WBC: 4.7 10*3/uL (ref 4.0–10.5)
nRBC: 0 % (ref 0.0–0.2)

## 2020-07-29 LAB — BASIC METABOLIC PANEL
Anion gap: 9 (ref 5–15)
BUN: 31 mg/dL — ABNORMAL HIGH (ref 8–23)
CO2: 28 mmol/L (ref 22–32)
Calcium: 10.9 mg/dL — ABNORMAL HIGH (ref 8.9–10.3)
Chloride: 98 mmol/L (ref 98–111)
Creatinine, Ser: 1.53 mg/dL — ABNORMAL HIGH (ref 0.44–1.00)
GFR, Estimated: 36 mL/min — ABNORMAL LOW (ref >60)
Glucose, Bld: 172 mg/dL — ABNORMAL HIGH (ref 70–99)
Potassium: 3.3 mmol/L — ABNORMAL LOW (ref 3.5–5.1)
Sodium: 135 mmol/L (ref 135–145)

## 2020-07-29 MED ORDER — POTASSIUM CHLORIDE CRYS ER 20 MEQ PO TBCR
20.0000 meq | EXTENDED_RELEASE_TABLET | Freq: Every day | ORAL | 0 refills | Status: DC
Start: 2020-07-29 — End: 2020-11-15

## 2020-07-29 NOTE — Telephone Encounter (Signed)
Stacey [name] [phone]) called and requested PT resumption from hospital stay . 1 x a week for 1 week 2 x a week for 4 weeks 1 x a week for 2 weeks Gave verbal ok to do orders for PT

## 2020-07-29 NOTE — Patient Instructions (Signed)
Continue weighing daily and call for an overnight weight gain of > 2 pounds or a weekly weight gain of >5 pounds. 

## 2020-07-29 NOTE — Progress Notes (Signed)
Patient ID: Stacey [name], female    DOB: June 25, 1945, 75 y.o.   MRN: [id number]  HPI  [name] is a 75 y/o female with a history of HTN, CKD, thyroid disease, COPD and chronic heart failure.   Echo report from 07/12/20 reviewed and showed an EF of 20-25% along with mild MR/AR/ AS.   Admitted 07/18/20 due to suprapubic pain with foley catheter in place. Diagnosed with UTI and placed on IV antibiotics. Renal ultrasound showed mild right hydronephrosis with left renal cyst. Discharged after 4 days. Admitted 07/12/20 due to acute on chronic HF with severe HTN. Cardiology consult obtained. Given IV lasix, IV NTG infusion and multiple medications for her HTN. Initially required bipap. Elevated troponins thought to be due to demand ischemia. Weaned off bipap/ oxygen. Failed voiding trial so foley inserted. Discharged after 3 days.   She presents today for her initial visit with a chief complaint of minimal fatigue upon moderate exertion. She describes this as having been present for several months. She has associated dizziness and difficulty sleeping along with this. She denies any abdominal distention, palpitations, pedal edema, chest pain, shortness of breath, cough or weight gain.   Admits that she used to eat out every Friday and would like to resume doing this if possible.   Past Medical History:  Diagnosis Date  . CHF (congestive heart failure) ([place])   . Chronic kidney disease   . COPD (chronic obstructive pulmonary disease) ([place])   . Hypertension   . Sarcoidosis   . Thyroid disease    Past Surgical History:  Procedure Laterality Date  . CATARACT EXTRACTION    . EYE SURGERY Right 07/09/2019  . GALLBLADDER SURGERY    . KNEE SURGERY    . thyroidectomy     Family History  Problem Relation Age of Onset  . Hypertension Mother   . Diabetes Mother   . Anuerysm Mother   . Diabetes Father   . Hypertension Father   . Prostate cancer Neg Hx   . Bladder Cancer Neg Hx   . Kidney cancer Neg  Hx    Social History   Tobacco Use  . Smoking status: Never Smoker  . Smokeless tobacco: Never Used  Substance Use Topics  . Alcohol use: No   Allergies  Allergen Reactions  . Codeine Other (See Comments)    Other reaction(s): GI Intolerance Other Reaction: nausea   chest pain Nausea  Other reaction(s): GI Intolerance, Other (See Comments) Nausea GI Intolerance, nausea, chest pain   . Propoxyphene Other (See Comments)    Other Reaction: nausea and chest pain  . Alendronate     Other reaction(s): Other (See Comments) Chest pain  . Ciprofloxacin Rash  . Lisinopril     Other reaction(s): Cough  . Amlodipine Swelling  . Aspirin     Other reaction(s): Other (See Comments) Other reaction(s): Other (See Comments) Stomach hurt  . Methotrexate Hives  . Tramadol Nausea And Vomiting  . Vicodin [Hydrocodone-Acetaminophen] Nausea And Vomiting   Prior to Admission medications   Medication Sig Start Date End Date Taking? Authorizing Provider  acetaminophen (TYLENOL) 325 MG tablet Take 650 mg by mouth every 6 (six) hours as needed.   Yes [provider]  carvedilol (COREG) 6.25 MG tablet Take 1 tablet (6.25 mg total) by mouth 2 (two) times daily with a meal. 07/15/20  Yes Jennye Boroughs, MD  cephALEXin (KEFLEX) 500 MG capsule Take 1 capsule (500 mg total) by mouth every 8 (eight) hours  for 7 days. 07/22/20 07/29/20 Yes Antonieta Pert, MD  cetirizine (ZYRTEC) 10 MG tablet Take 1 tablet (10 mg total) by mouth daily. 03/05/20  Yes Boscia, Greer Ee, NP  clobetasol ointment (TEMOVATE) 9.83 % Apply 1 application topically 2 (two) times daily as needed (rash).  02/06/16  Yes [provider]  cloNIDine (CATAPRES) 0.1 MG tablet Take 1 tablet (0.1 mg total) by mouth 2 (two) times daily. 05/09/19  Yes Boscia, Greer Ee, NP  cyclobenzaprine (FLEXERIL) 10 MG tablet Take 1 tablet (10 mg total) by mouth 3 (three) times daily as needed. 06/25/20  Yes Menshew, Dannielle Karvonen, PA-C  fluticasone  (FLONASE) 50 MCG/ACT nasal spray Place 2 sprays into both nostrils daily. 03/23/20  Yes Luiz Ochoa, NP  furosemide (LASIX) 20 MG tablet Take 2 tablets (40 mg total) by mouth daily. 07/15/20  Yes Jennye Boroughs, MD  gabapentin (NEURONTIN) 100 MG capsule Take 2 capsules (200 mg total) by mouth at bedtime. 07/15/20  Yes Jennye Boroughs, MD  hydrALAZINE (APRESOLINE) 50 MG tablet Take 2 tablets (100 mg total) by mouth 3 (three) times daily. 01/16/18  Yes Thurnell Lose, MD  latanoprost (XALATAN) 0.005 % ophthalmic solution Place 1 drop into both eyes at bedtime.  01/16/14  Yes [provider]  levothyroxine (SYNTHROID, LEVOTHROID) 88 MCG tablet Take 88 mcg by mouth daily before breakfast.  03/11/17  Yes [provider]  losartan (COZAAR) 100 MG tablet Take 1 tablet (100 mg total) by mouth daily. 04/28/20  Yes Luiz Ochoa, NP  montelukast (SINGULAIR) 10 MG tablet Take 1 tablet (10 mg total) by mouth daily. 04/28/20  Yes Luiz Ochoa, NP  Multiple Vitamins-Minerals (MULTIVITAMIN GUMMIES WOMENS) CHEW Chew 2 tablets by mouth daily.   Yes [provider]  ondansetron (ZOFRAN ODT) 4 MG disintegrating tablet Take 1 tablet (4 mg total) by mouth every 8 (eight) hours as needed. 06/25/20  Yes Menshew, Dannielle Karvonen, PA-C  pantoprazole (PROTONIX) 40 MG tablet TAKE 1 TABLET BY MOUTH AS NEEDED Patient taking differently: Take 40 mg by mouth daily. 04/18/19  Yes Boscia, Heather E, NP  pravastatin (PRAVACHOL) 20 MG tablet TAKE 1 TABLET(20 MG) BY MOUTH EVERY NIGHT Patient taking differently: Take 40 mg by mouth at bedtime. 08/19/19  Yes Boscia, Heather E, NP  timolol (TIMOPTIC) 0.25 % ophthalmic solution Place 1 drop into both eyes 2 (two) times daily.  07/30/09  Yes [provider]  potassium chloride SA (KLOR-CON) 20 MEQ tablet Take 1 tablet (20 mEq total) by mouth daily for 2 days. 07/29/20 07/31/20  Alisa Graff, FNP    Review of Systems  Constitutional: Positive for  fatigue (with clonidine). Negative for appetite change.  HENT: Negative for congestion, postnasal drip and sore throat.   Eyes: Negative.   Respiratory: Negative for cough and shortness of breath.   Cardiovascular: Negative for chest pain, palpitations and leg swelling.  Gastrointestinal: Negative for abdominal distention.  Endocrine: Negative.   Genitourinary: Negative.   Musculoskeletal: Negative for back pain and neck pain.  Skin: Negative.   Allergic/Immunologic: Negative.   Neurological: Positive for dizziness (with clonidine). Negative for light-headedness.  Hematological: Negative for adenopathy. Does not bruise/bleed easily.  Psychiatric/Behavioral: Positive for sleep disturbance (sleeping on 2 pillows; not sleeping well). Negative for dysphoric mood. The patient is not nervous/anxious.    Vitals:   07/29/20 1038  BP: (!) 163/68  Pulse: 63  Resp: 18  SpO2: 99%  Weight: 147 lb 4 oz (66.8 kg)  Height: 4\' 9"  (1.448 m)   Wt Readings from Last 3 Encounters:  07/29/20 147 lb 4 oz (66.8 kg)  07/26/20 146 lb (66.2 kg)  07/18/20 144 lb (65.3 kg)   Lab Results  Component Value Date   CREATININE 1.31 (H) 07/22/2020   CREATININE 1.64 (H) 07/20/2020   CREATININE 1.82 (H) 07/19/2020   Physical Exam Vitals and nursing note reviewed. Exam conducted with a chaperone present (son).  Constitutional:      Appearance: Normal appearance.  HENT:     Head: Normocephalic and atraumatic.  Cardiovascular:     Rate and Rhythm: Normal rate and regular rhythm.  Pulmonary:     Effort: Pulmonary effort is normal. No respiratory distress.     Breath sounds: No wheezing or rales.  Abdominal:     General: There is no distension.     Palpations: Abdomen is soft.     Tenderness: There is no abdominal tenderness.  Musculoskeletal:        General: No tenderness.     Cervical back: Normal range of motion and neck supple.     Right lower leg: No edema.     Left lower leg: No edema.  Skin:     General: Skin is warm and dry.  Neurological:     General: No focal deficit present.     Mental Status: She is alert and oriented to person, place, and time.  Psychiatric:        Mood and Affect: Mood normal.        Behavior: Behavior normal.        Thought Content: Thought content normal.    Assessment & Plan:  1: Chronic heart failure with reduced ejection fraction- - NYHA class II - euvolemic today - weighing daily and she was reminded to call for an overnight weight gain of > 2 pounds or a weekly weight gain of > 5 pounds - not adding salt and has been reading food labels for sodium content; instructed that she needed to keep her daily sodium intake to around 2000mg  / day; low sodium cookbook and written dietary information was given to her - discussed how she could eat out and how to make lower sodium choices when she does eat out - saw cardiology [name]) 01/15/20 - on GDMT of carvedilol and losartan - discussed changing losartan to entresto and possibly adding spironolactone/ SGLT2 if lab work allows - HR today does not allow for carvedilol titration - discussed paramedicine program and she is interested; brochure given and referral placed - BNP 07/18/20 was 68.3 - PharmD reconciled medications with the patient  2: HTN with CKD- - BP elevated today but she says that she forgot to take her hydralazine and clonidine this morning because she was running behind; she is planning on taking it when she gets home - saw PCP (McDonough) 06/14/20 - BMP 07/22/20 reviewed and showed sodium 138, potassium 4.9, creatinine 1.31 and GFR 43 - recheck BMP/ CBC today per discharge summary   Patient did not bring her medications nor a list. Each medication was verbally reviewed with the patient and she was encouraged to bring the bottles to every visit to confirm accuracy of list.  Return in 3 weeks or sooner for any questions/problems before then.

## 2020-07-29 NOTE — Progress Notes (Signed)
[facility] - PHARMACIST COUNSELING NOTE  ADHERENCE ASSESSMENT   Do you ever forget to take your medication? [] Yes (1) [x] No (0)  Do you ever skip doses due to side effects? [] Yes (1) [x] No (0)  Do you have trouble affording your medicines? [] Yes (1) [x] No (0)  Are you ever unable to pick up your medication due to transportation difficulties? [] Yes (1) [x] No (0)  Do you ever stop taking your medications because you don't believe they are helping? [] Yes (1) [x] No (0)  Total score _0______    Recommendations given to patient about increasing adherence: Pt is adherent with current regimen  Guideline-Directed Medical Therapy/Evidence Based Medicine  ACE/ARB/ARNI: losartan Beta Blocker: carvedilol Aldosterone Antagonist: N/A Diuretic: furosemide    SUBJECTIVE  HPI:  Past Medical History:  Diagnosis Date  . Hypertension   . Thyroid disease         OBJECTIVE   Vital signs: HR 63, BP 163/68, weight (pounds) 147 ECHO: Date 07/12/20, EF 20-25%  BMP Latest Ref Rng & Units 07/22/2020 07/20/2020 07/19/2020  Glucose 70 - 99 mg/dL 140(H) 157(H) 159(H)  BUN 8 - 23 mg/dL 21 35(H) 30(H)  Creatinine 0.44 - 1.00 mg/dL 1.31(H) 1.64(H) 1.82(H)  Sodium 135 - 145 mmol/L 138 135 133(L)  Potassium 3.5 - 5.1 mmol/L 4.9 3.8 3.6  Chloride 98 - 111 mmol/L 103 98 98  CO2 22 - 32 mmol/L 27 28 26   Calcium 8.9 - 10.3 mg/dL 11.3(H) 10.1 9.6    ASSESSMENT 75 yo F presenting to heart failure clinic for new patient visit. PMH includes hypothyroidism, HLD and HTN. No barriers to adherence identified during medication reconciliation. Pt has not taken clonidine or hydralazine this morning.    PLAN CHF/HTN - Continue hydralazine 100 mg three times daily  - Continue carvedilol 6.25 mg twice daily  - Continue clonidine 0.1 mg twice daily  - Continue furosemide 40 mg daily  - Continue losartan 100 mg daily  - Recheck BMP  UTI - Continue cephalexin 500 mg  three times daily   HLD - Continue pravastatin 40 mg at bedtime   Hypothyroidism - Continue levothyroxine 88 mcg daily   Rash - Continue clobetasol ointment as needed  Allergies - Continue montelukast 10 mg daily - Continue cetirizine 10 mg daily  - Continue fluticasone 2 sprays per nostril daily   Pain - Continue acetaminophen as needed - Continue gabapentin 200 mg at bedtime   Muscle Spasms - Continue cyclobenzaprine 10 mg three times daily as needed  Glaucoma - Continue latanoprost eye drops in each eye at bedtime - Continue timolol eye drops twice daily   Acid reflux - Continue pantoprazole 40 mg daily   General Health - Continue multivitamin daily   Nausea/vomiting - Continue ondansetron as needed   Time spent: 15 minutes  Benn Moulder, PharmD Pharmacy Resident  07/29/2020 11:43 AM    Current Outpatient Medications:  .  carvedilol (COREG) 6.25 MG tablet, Take 1 tablet (6.25 mg total) by mouth 2 (two) times daily with a meal., Disp: 60 tablet, Rfl: 0 .  cephALEXin (KEFLEX) 500 MG capsule, Take 1 capsule (500 mg total) by mouth every 8 (eight) hours for 7 days., Disp: 21 capsule, Rfl: 0 .  cetirizine (ZYRTEC) 10 MG tablet, Take 1 tablet (10 mg total) by mouth daily., Disp: 90 tablet, Rfl: 4 .  clobetasol ointment (TEMOVATE) 6.37 %, Apply 1 application topically 2 (two) times daily as needed (rash). , Disp: , Rfl:  .  cloNIDine (CATAPRES) 0.1 MG tablet, Take 1 tablet (0.1 mg total) by mouth 2 (two) times daily., Disp: 180 tablet, Rfl: 4 .  cyclobenzaprine (FLEXERIL) 10 MG tablet, Take 1 tablet (10 mg total) by mouth 3 (three) times daily as needed., Disp: 30 tablet, Rfl: 0 .  fluticasone (FLONASE) 50 MCG/ACT nasal spray, Place 2 sprays into both nostrils daily., Disp: 16 g, Rfl: 6 .  furosemide (LASIX) 20 MG tablet, Take 2 tablets (40 mg total) by mouth daily., Disp: 30 tablet, Rfl: 0 .  gabapentin (NEURONTIN) 100 MG capsule, Take 2 capsules (200 mg  total) by mouth at bedtime., Disp: , Rfl:  .  hydrALAZINE (APRESOLINE) 50 MG tablet, Take 2 tablets (100 mg total) by mouth 3 (three) times daily., Disp: 90 tablet, Rfl: 0 .  latanoprost (XALATAN) 0.005 % ophthalmic solution, Place 1 drop into both eyes at bedtime. , Disp: , Rfl:  .  levothyroxine (SYNTHROID, LEVOTHROID) 88 MCG tablet, Take 88 mcg by mouth daily before breakfast. , Disp: , Rfl: 1 .  losartan (COZAAR) 100 MG tablet, Take 1 tablet (100 mg total) by mouth daily., Disp: 90 tablet, Rfl: 1 .  montelukast (SINGULAIR) 10 MG tablet, Take 1 tablet (10 mg total) by mouth daily., Disp: 90 tablet, Rfl: 3 .  Multiple Vitamins-Minerals (MULTIVITAMIN GUMMIES WOMENS) CHEW, Chew 2 tablets by mouth daily., Disp: , Rfl:  .  ondansetron (ZOFRAN ODT) 4 MG disintegrating tablet, Take 1 tablet (4 mg total) by mouth every 8 (eight) hours as needed., Disp: 15 tablet, Rfl: 0 .  pantoprazole (PROTONIX) 40 MG tablet, TAKE 1 TABLET BY MOUTH AS NEEDED (Patient taking differently: Take 40 mg by mouth daily.), Disp: 90 tablet, Rfl: 2 .  pravastatin (PRAVACHOL) 20 MG tablet, TAKE 1 TABLET(20 MG) BY MOUTH EVERY NIGHT (Patient taking differently: Take 40 mg by mouth at bedtime.), Disp: 90 tablet, Rfl: 2 .  timolol (TIMOPTIC) 0.25 % ophthalmic solution, Place 1 drop into both eyes 2 (two) times daily. , Disp: , Rfl:    COUNSELING POINTS/CLINICAL PEARLS  Carvedilol (Goal: weight less than 85 kg is 25 mg BID, weight greater than 85 kg is 50 mg BID)  Patient should avoid activities requiring coordination until drug effects are realized, as drug may cause dizziness.  This drug may cause diarrhea, nausea, vomiting, arthralgia, back pain, myalgia, headache, vision disorder, erectile dysfunction, reduced libido, or fatigue.  Instruct patient to report signs/symptoms of adverse cardiovascular effects such as hypotension (especially in elderly patients), arrhythmias, syncope, palpitations, angina, or edema.  Drug may mask  symptoms of hypoglycemia. Advise diabetic patients to carefully monitor blood sugar levels.  Patient should take drug with food.  Advise patient against sudden discontinuation of drug. Losartan (Goal: 150 mg once daily)  Warn female patient to avoid pregnancy and to report a pregnancy that occurs during therapy.  Side effects may include dizziness, upper respiratory infection, nasal congestion, and back pain.  Warn patient to avoid use of potassium supplements or potassium-containing salt substitutes unless they consult healthcare provider. Furosemide  Drug causes sun-sensitivity. Advise patient to use sunscreen and avoid tanning beds. Patient should avoid activities requiring coordination until drug effects are realized, as drug may cause dizziness, vertigo, or blurred vision. This drug may cause hyperglycemia, hyperuricemia, constipation, diarrhea, loss of appetite, nausea, vomiting, purpuric disorder, cramps, spasticity, asthenia, headache, paresthesia, or scaling eczema. Instruct patient to report unusual bleeding/bruising or signs/symptoms of hypotension, infection, pancreatitis, or ototoxicity (tinnitus, hearing impairment). Advise patient to report signs/symptoms of a  severe skin reactions (flu-like symptoms, spreading red rash, or skin/mucous membrane blistering) or erythema multiforme. Instruct patient to eat high-potassium foods during drug therapy, as directed by healthcare professional.  Patient should not drink alcohol while taking this drug. Torsemide  Side effects may include excessive urination.  Tell patient to report symptoms of ototoxicity.  Instruct patient to report lightheadedness or syncope.  Warn patient to avoid use of nonprescription NSAID products without first discussing it with their healthcare provider.  DRUGS TO AVOID IN HEART FAILURE  Drug or Class Mechanism  Analgesics . NSAIDs . COX-2 inhibitors . Glucocorticoids  Sodium and water retention, increased  systemic vascular resistance, decreased response to diuretics   Diabetes Medications . Metformin . Thiazolidinediones o Rosiglitazone (Avandia) o Pioglitazone (Actos) . DPP4 Inhibitors o Saxagliptin (Onglyza) o Sitagliptin (Januvia)   Lactic acidosis Possible calcium channel blockade   Unknown  Antiarrhythmics . Class I  o Flecainide o Disopyramide . Class III o Sotalol . Other o Dronedarone  Negative inotrope, proarrhythmic   Proarrhythmic, beta blockade  Negative inotrope  Antihypertensives . Alpha Blockers o Doxazosin . Calcium Channel Blockers o Diltiazem o Verapamil o Nifedipine . Central Alpha Adrenergics o Moxonidine . Peripheral Vasodilators o Minoxidil  Increases renin and aldosterone  Negative inotrope    Possible sympathetic withdrawal  Unknown  Anti-infective . Itraconazole . Amphotericin B  Negative inotrope Unknown  Hematologic . Anagrelide . Cilostazol   Possible inhibition of PD IV Inhibition of PD III causing arrhythmias  Neurologic/Psychiatric . Stimulants . Anti-Seizure Drugs o Carbamazepine o Pregabalin . Antidepressants o Tricyclics o Citalopram . Parkinsons o Bromocriptine o Pergolide o Pramipexole . Antipsychotics o Clozapine . Antimigraine o Ergotamine o Methysergide . Appetite suppressants . Bipolar o Lithium  Peripheral alpha and beta agonist activity  Negative inotrope and chronotrope Calcium channel blockade  Negative inotrope, proarrhythmic Dose-dependent QT prolongation  Excessive serotonin activity/valvular damage Excessive serotonin activity/valvular damage Unknown  IgE mediated hypersensitivy, calcium channel blockade  Excessive serotonin activity/valvular damage Excessive serotonin activity/valvular damage Valvular damage  Direct myofibrillar degeneration, adrenergic stimulation  Antimalarials . Chloroquine . Hydroxychloroquine Intracellular inhibition of lysosomal enzymes  Urologic  Agents . Alpha Blockers o Doxazosin o Prazosin o Tamsulosin o Terazosin  Increased renin and aldosterone  Adapted from Page RL, et al. "Drugs That May Cause or Exacerbate Heart Failure: A Scientific Statement from the [place]." Circulation 2016; 694:W54-O27. DOI: 10.1161/CIR.0000000000000426   MEDICATION ADHERENCES TIPS AND STRATEGIES 1. Taking medication as prescribed improves patient outcomes in heart failure (reduces hospitalizations, improves symptoms, increases survival) 2. Side effects of medications can be managed by decreasing doses, switching agents, stopping drugs, or adding additional therapy. Please let someone in the [facility] know if you have having bothersome side effects so we can modify your regimen. Do not alter your medication regimen without talking to [place].  3. Medication reminders can help patients remember to take drugs on time. If you are missing or forgetting doses you can try linking behaviors, using pill boxes, or an electronic reminder like an alarm on your phone or an app. Some people can also get automated phone calls as medication reminders.

## 2020-07-29 NOTE — Telephone Encounter (Signed)
Spoke with patient regarding her BMP results obtained earlier today. Potassium little low at 3.3 and renal function shows creatinine 1.53, BUN 31 and GFR 36.   Will send in potassium 38meq PO X 2 days and recheck labs next week.   Patient verbalized understanding.

## 2020-07-30 DIAGNOSIS — I5043 Acute on chronic combined systolic (congestive) and diastolic (congestive) heart failure: Secondary | ICD-10-CM | POA: Diagnosis not present

## 2020-07-30 DIAGNOSIS — D869 Sarcoidosis, unspecified: Secondary | ICD-10-CM | POA: Diagnosis not present

## 2020-07-30 DIAGNOSIS — J449 Chronic obstructive pulmonary disease, unspecified: Secondary | ICD-10-CM | POA: Diagnosis not present

## 2020-07-30 DIAGNOSIS — T83511D Infection and inflammatory reaction due to indwelling urethral catheter, subsequent encounter: Secondary | ICD-10-CM | POA: Diagnosis not present

## 2020-07-30 DIAGNOSIS — N179 Acute kidney failure, unspecified: Secondary | ICD-10-CM | POA: Diagnosis not present

## 2020-07-30 DIAGNOSIS — I420 Dilated cardiomyopathy: Secondary | ICD-10-CM | POA: Diagnosis not present

## 2020-07-30 DIAGNOSIS — I130 Hypertensive heart and chronic kidney disease with heart failure and stage 1 through stage 4 chronic kidney disease, or unspecified chronic kidney disease: Secondary | ICD-10-CM | POA: Diagnosis not present

## 2020-07-30 DIAGNOSIS — N1832 Chronic kidney disease, stage 3b: Secondary | ICD-10-CM | POA: Diagnosis not present

## 2020-07-30 DIAGNOSIS — Z466 Encounter for fitting and adjustment of urinary device: Secondary | ICD-10-CM | POA: Diagnosis not present

## 2020-07-30 DIAGNOSIS — N136 Pyonephrosis: Secondary | ICD-10-CM | POA: Diagnosis not present

## 2020-07-30 DIAGNOSIS — B9689 Other specified bacterial agents as the cause of diseases classified elsewhere: Secondary | ICD-10-CM | POA: Diagnosis not present

## 2020-08-03 ENCOUNTER — Telehealth: Payer: Self-pay | Admitting: Internal Medicine

## 2020-08-03 DIAGNOSIS — Z466 Encounter for fitting and adjustment of urinary device: Secondary | ICD-10-CM | POA: Diagnosis not present

## 2020-08-03 DIAGNOSIS — N179 Acute kidney failure, unspecified: Secondary | ICD-10-CM | POA: Diagnosis not present

## 2020-08-03 DIAGNOSIS — N1832 Chronic kidney disease, stage 3b: Secondary | ICD-10-CM | POA: Diagnosis not present

## 2020-08-03 DIAGNOSIS — T83511D Infection and inflammatory reaction due to indwelling urethral catheter, subsequent encounter: Secondary | ICD-10-CM | POA: Diagnosis not present

## 2020-08-03 DIAGNOSIS — I130 Hypertensive heart and chronic kidney disease with heart failure and stage 1 through stage 4 chronic kidney disease, or unspecified chronic kidney disease: Secondary | ICD-10-CM | POA: Diagnosis not present

## 2020-08-03 DIAGNOSIS — I5043 Acute on chronic combined systolic (congestive) and diastolic (congestive) heart failure: Secondary | ICD-10-CM | POA: Diagnosis not present

## 2020-08-03 DIAGNOSIS — J449 Chronic obstructive pulmonary disease, unspecified: Secondary | ICD-10-CM | POA: Diagnosis not present

## 2020-08-03 DIAGNOSIS — D869 Sarcoidosis, unspecified: Secondary | ICD-10-CM | POA: Diagnosis not present

## 2020-08-03 DIAGNOSIS — N136 Pyonephrosis: Secondary | ICD-10-CM | POA: Diagnosis not present

## 2020-08-03 DIAGNOSIS — I420 Dilated cardiomyopathy: Secondary | ICD-10-CM | POA: Diagnosis not present

## 2020-08-03 DIAGNOSIS — B9689 Other specified bacterial agents as the cause of diseases classified elsewhere: Secondary | ICD-10-CM | POA: Diagnosis not present

## 2020-08-03 NOTE — Progress Notes (Signed)
  Chronic Care Management   Outreach Note  08/03/2020 Name: Stacey [name] MRN: [id number] DOB: Jul 08, 1945  Referred by: [name], MD Reason for referral : No chief complaint on file.   An unsuccessful telephone outreach was attempted today. The patient was referred to the pharmacist for assistance with care management and care coordination.   Follow Up Plan:   Carley Perdue [employer] Scheduler

## 2020-08-04 ENCOUNTER — Other Ambulatory Visit (HOSPITAL_COMMUNITY): Payer: Self-pay

## 2020-08-04 ENCOUNTER — Encounter (HOSPITAL_COMMUNITY): Payer: Self-pay

## 2020-08-04 NOTE — Progress Notes (Signed)
Today had a first home visit with Stacey [name].  She states doing well.  She states it was very scary going through the hospital visit.  She states always tries to watch what she eats.  She states was drinking a lot.  Explained about drinking too much fluids is bad and how it effects her body.  She is now drinking 64 ounces a day.  She has no edema in her legs or ankles.  She does not like to wear socks or shoes.  She states she gains fluid in her abdomen.  Abdomen is soft and non tender today and lungs are clear.  Lungs are clear.  She denies any chest pain, headaches, dizziness or increased shortness of breath.  Explained the importance of weighing daily and to call with weight gain of 2 lbs overnight, 5 lbs in a week.  Explained the program to her and she is willing to be part of it.  She tries to walk in her driveway daily.  She does not drive but has a son that will take her where she needs to go.  She states meds are affordable and she has all her medications.  She is aware of how to take them and what they are for.  Verified her meds with her.  Will visit for heart failure, diet and medication compliance.  She has my number and appears to understand to call with any problems or weight gain.   [place] [phone]

## 2020-08-05 ENCOUNTER — Other Ambulatory Visit
Admission: RE | Admit: 2020-08-05 | Discharge: 2020-08-05 | Disposition: A | Discharge status: Home / Self Care | Payer: Medicare Other | Source: Ambulatory Visit | Attending: Family | Admitting: Family

## 2020-08-05 ENCOUNTER — Other Ambulatory Visit: Payer: Self-pay

## 2020-08-05 DIAGNOSIS — E876 Hypokalemia: Secondary | ICD-10-CM | POA: Diagnosis not present

## 2020-08-05 DIAGNOSIS — B028 Zoster with other complications: Secondary | ICD-10-CM

## 2020-08-05 LAB — BASIC METABOLIC PANEL
Anion gap: 8 (ref 5–15)
BUN: 30 mg/dL — ABNORMAL HIGH (ref 8–23)
CO2: 27 mmol/L (ref 22–32)
Calcium: 11.1 mg/dL — ABNORMAL HIGH (ref 8.9–10.3)
Chloride: 105 mmol/L (ref 98–111)
Creatinine, Ser: 1.73 mg/dL — ABNORMAL HIGH (ref 0.44–1.00)
GFR, Estimated: 31 mL/min — ABNORMAL LOW (ref >60)
Glucose, Bld: 117 mg/dL — ABNORMAL HIGH (ref 70–99)
Potassium: 3.9 mmol/L (ref 3.5–5.1)
Sodium: 140 mmol/L (ref 135–145)

## 2020-08-05 MED ORDER — GABAPENTIN 100 MG PO CAPS: 200.0000 mg | ORAL_CAPSULE | Freq: Every day | ORAL | Status: DC

## 2020-08-06 DIAGNOSIS — N1832 Chronic kidney disease, stage 3b: Secondary | ICD-10-CM | POA: Diagnosis not present

## 2020-08-06 DIAGNOSIS — T83511D Infection and inflammatory reaction due to indwelling urethral catheter, subsequent encounter: Secondary | ICD-10-CM | POA: Diagnosis not present

## 2020-08-06 DIAGNOSIS — I5043 Acute on chronic combined systolic (congestive) and diastolic (congestive) heart failure: Secondary | ICD-10-CM | POA: Diagnosis not present

## 2020-08-06 DIAGNOSIS — B9689 Other specified bacterial agents as the cause of diseases classified elsewhere: Secondary | ICD-10-CM | POA: Diagnosis not present

## 2020-08-06 DIAGNOSIS — Z466 Encounter for fitting and adjustment of urinary device: Secondary | ICD-10-CM | POA: Diagnosis not present

## 2020-08-06 DIAGNOSIS — N179 Acute kidney failure, unspecified: Secondary | ICD-10-CM | POA: Diagnosis not present

## 2020-08-06 DIAGNOSIS — I420 Dilated cardiomyopathy: Secondary | ICD-10-CM | POA: Diagnosis not present

## 2020-08-06 DIAGNOSIS — I130 Hypertensive heart and chronic kidney disease with heart failure and stage 1 through stage 4 chronic kidney disease, or unspecified chronic kidney disease: Secondary | ICD-10-CM | POA: Diagnosis not present

## 2020-08-06 DIAGNOSIS — N136 Pyonephrosis: Secondary | ICD-10-CM | POA: Diagnosis not present

## 2020-08-06 DIAGNOSIS — J449 Chronic obstructive pulmonary disease, unspecified: Secondary | ICD-10-CM | POA: Diagnosis not present

## 2020-08-06 DIAGNOSIS — D869 Sarcoidosis, unspecified: Secondary | ICD-10-CM | POA: Diagnosis not present

## 2020-08-09 DIAGNOSIS — N1832 Chronic kidney disease, stage 3b: Secondary | ICD-10-CM | POA: Diagnosis not present

## 2020-08-09 DIAGNOSIS — I5043 Acute on chronic combined systolic (congestive) and diastolic (congestive) heart failure: Secondary | ICD-10-CM | POA: Diagnosis not present

## 2020-08-09 DIAGNOSIS — N179 Acute kidney failure, unspecified: Secondary | ICD-10-CM | POA: Diagnosis not present

## 2020-08-09 DIAGNOSIS — T83511D Infection and inflammatory reaction due to indwelling urethral catheter, subsequent encounter: Secondary | ICD-10-CM | POA: Diagnosis not present

## 2020-08-09 DIAGNOSIS — I420 Dilated cardiomyopathy: Secondary | ICD-10-CM | POA: Diagnosis not present

## 2020-08-09 DIAGNOSIS — N136 Pyonephrosis: Secondary | ICD-10-CM | POA: Diagnosis not present

## 2020-08-09 DIAGNOSIS — Z466 Encounter for fitting and adjustment of urinary device: Secondary | ICD-10-CM | POA: Diagnosis not present

## 2020-08-09 DIAGNOSIS — I130 Hypertensive heart and chronic kidney disease with heart failure and stage 1 through stage 4 chronic kidney disease, or unspecified chronic kidney disease: Secondary | ICD-10-CM | POA: Diagnosis not present

## 2020-08-09 DIAGNOSIS — J449 Chronic obstructive pulmonary disease, unspecified: Secondary | ICD-10-CM | POA: Diagnosis not present

## 2020-08-09 DIAGNOSIS — D869 Sarcoidosis, unspecified: Secondary | ICD-10-CM | POA: Diagnosis not present

## 2020-08-09 DIAGNOSIS — B9689 Other specified bacterial agents as the cause of diseases classified elsewhere: Secondary | ICD-10-CM | POA: Diagnosis not present

## 2020-08-10 DIAGNOSIS — I130 Hypertensive heart and chronic kidney disease with heart failure and stage 1 through stage 4 chronic kidney disease, or unspecified chronic kidney disease: Secondary | ICD-10-CM | POA: Diagnosis not present

## 2020-08-10 DIAGNOSIS — D869 Sarcoidosis, unspecified: Secondary | ICD-10-CM | POA: Diagnosis not present

## 2020-08-10 DIAGNOSIS — J449 Chronic obstructive pulmonary disease, unspecified: Secondary | ICD-10-CM | POA: Diagnosis not present

## 2020-08-10 DIAGNOSIS — I420 Dilated cardiomyopathy: Secondary | ICD-10-CM | POA: Diagnosis not present

## 2020-08-10 DIAGNOSIS — B9689 Other specified bacterial agents as the cause of diseases classified elsewhere: Secondary | ICD-10-CM | POA: Diagnosis not present

## 2020-08-10 DIAGNOSIS — N136 Pyonephrosis: Secondary | ICD-10-CM | POA: Diagnosis not present

## 2020-08-10 DIAGNOSIS — Z466 Encounter for fitting and adjustment of urinary device: Secondary | ICD-10-CM | POA: Diagnosis not present

## 2020-08-10 DIAGNOSIS — N1832 Chronic kidney disease, stage 3b: Secondary | ICD-10-CM | POA: Diagnosis not present

## 2020-08-10 DIAGNOSIS — I5043 Acute on chronic combined systolic (congestive) and diastolic (congestive) heart failure: Secondary | ICD-10-CM | POA: Diagnosis not present

## 2020-08-10 DIAGNOSIS — N179 Acute kidney failure, unspecified: Secondary | ICD-10-CM | POA: Diagnosis not present

## 2020-08-10 DIAGNOSIS — T83511D Infection and inflammatory reaction due to indwelling urethral catheter, subsequent encounter: Secondary | ICD-10-CM | POA: Diagnosis not present

## 2020-08-11 ENCOUNTER — Telehealth: Payer: Self-pay | Admitting: Internal Medicine

## 2020-08-11 NOTE — Chronic Care Management (AMB) (Signed)
  Chronic Care Management   Note  08/11/2020 Name: NEIL ERRICKSON MRN: [id number] DOB: Jan 27, 1946  SARAHMARIE LEAVEY is a 75 y.o. year old female who is a primary care patient of [name], MD. I reached out to Cindy Hazy by phone today in response to a referral sent by Ms. Erin Hearing Moore's PCP, [name], MD.   Ms. [name] was given information about Chronic Care Management services today including:  1. CCM service includes personalized support from designated clinical staff supervised by her physician, including individualized plan of care and coordination with other care providers 2. 24/7 contact phone numbers for assistance for urgent and routine care needs. 3. Service will only be billed when office clinical staff spend 20 minutes or more in a month to coordinate care. 4. Only one practitioner may furnish and bill the service in a calendar month. 5. The patient may stop CCM services at any time (effective at the end of the month) by phone call to the office staff.   Patient agreed to services and verbal consent obtained.   Follow up plan:   [place] Secretary/administrator

## 2020-08-16 DIAGNOSIS — I6529 Occlusion and stenosis of unspecified carotid artery: Secondary | ICD-10-CM | POA: Diagnosis not present

## 2020-08-16 DIAGNOSIS — G629 Polyneuropathy, unspecified: Secondary | ICD-10-CM | POA: Diagnosis not present

## 2020-08-16 DIAGNOSIS — Z6833 Body mass index (BMI) 33.0-33.9, adult: Secondary | ICD-10-CM | POA: Diagnosis not present

## 2020-08-16 DIAGNOSIS — E210 Primary hyperparathyroidism: Secondary | ICD-10-CM | POA: Diagnosis not present

## 2020-08-16 DIAGNOSIS — N179 Acute kidney failure, unspecified: Secondary | ICD-10-CM | POA: Diagnosis not present

## 2020-08-16 DIAGNOSIS — T83511D Infection and inflammatory reaction due to indwelling urethral catheter, subsequent encounter: Secondary | ICD-10-CM | POA: Diagnosis not present

## 2020-08-16 DIAGNOSIS — I130 Hypertensive heart and chronic kidney disease with heart failure and stage 1 through stage 4 chronic kidney disease, or unspecified chronic kidney disease: Secondary | ICD-10-CM | POA: Diagnosis not present

## 2020-08-16 DIAGNOSIS — N1832 Chronic kidney disease, stage 3b: Secondary | ICD-10-CM | POA: Diagnosis not present

## 2020-08-16 DIAGNOSIS — E890 Postprocedural hypothyroidism: Secondary | ICD-10-CM | POA: Diagnosis not present

## 2020-08-16 DIAGNOSIS — I083 Combined rheumatic disorders of mitral, aortic and tricuspid valves: Secondary | ICD-10-CM | POA: Diagnosis not present

## 2020-08-16 DIAGNOSIS — B9689 Other specified bacterial agents as the cause of diseases classified elsewhere: Secondary | ICD-10-CM | POA: Diagnosis not present

## 2020-08-16 DIAGNOSIS — J449 Chronic obstructive pulmonary disease, unspecified: Secondary | ICD-10-CM | POA: Diagnosis not present

## 2020-08-16 DIAGNOSIS — R339 Retention of urine, unspecified: Secondary | ICD-10-CM | POA: Diagnosis not present

## 2020-08-16 DIAGNOSIS — N136 Pyonephrosis: Secondary | ICD-10-CM | POA: Diagnosis not present

## 2020-08-16 DIAGNOSIS — D8685 Sarcoid myocarditis: Secondary | ICD-10-CM | POA: Diagnosis not present

## 2020-08-16 DIAGNOSIS — K862 Cyst of pancreas: Secondary | ICD-10-CM | POA: Diagnosis not present

## 2020-08-16 DIAGNOSIS — Q6100 Congenital renal cyst, unspecified: Secondary | ICD-10-CM | POA: Diagnosis not present

## 2020-08-16 DIAGNOSIS — M25562 Pain in left knee: Secondary | ICD-10-CM | POA: Diagnosis not present

## 2020-08-16 DIAGNOSIS — K2100 Gastro-esophageal reflux disease with esophagitis, without bleeding: Secondary | ICD-10-CM | POA: Diagnosis not present

## 2020-08-16 DIAGNOSIS — Z466 Encounter for fitting and adjustment of urinary device: Secondary | ICD-10-CM | POA: Diagnosis not present

## 2020-08-16 DIAGNOSIS — E663 Overweight: Secondary | ICD-10-CM | POA: Diagnosis not present

## 2020-08-16 DIAGNOSIS — E782 Mixed hyperlipidemia: Secondary | ICD-10-CM | POA: Diagnosis not present

## 2020-08-16 DIAGNOSIS — M1990 Unspecified osteoarthritis, unspecified site: Secondary | ICD-10-CM | POA: Diagnosis not present

## 2020-08-16 DIAGNOSIS — I5043 Acute on chronic combined systolic (congestive) and diastolic (congestive) heart failure: Secondary | ICD-10-CM | POA: Diagnosis not present

## 2020-08-18 DIAGNOSIS — H109 Unspecified conjunctivitis: Secondary | ICD-10-CM | POA: Diagnosis not present

## 2020-08-18 DIAGNOSIS — H6123 Impacted cerumen, bilateral: Secondary | ICD-10-CM | POA: Diagnosis not present

## 2020-08-19 DIAGNOSIS — T83511D Infection and inflammatory reaction due to indwelling urethral catheter, subsequent encounter: Secondary | ICD-10-CM | POA: Diagnosis not present

## 2020-08-19 DIAGNOSIS — I119 Hypertensive heart disease without heart failure: Secondary | ICD-10-CM | POA: Diagnosis not present

## 2020-08-19 DIAGNOSIS — R001 Bradycardia, unspecified: Secondary | ICD-10-CM | POA: Diagnosis not present

## 2020-08-19 DIAGNOSIS — B9689 Other specified bacterial agents as the cause of diseases classified elsewhere: Secondary | ICD-10-CM | POA: Diagnosis not present

## 2020-08-19 DIAGNOSIS — E782 Mixed hyperlipidemia: Secondary | ICD-10-CM | POA: Diagnosis not present

## 2020-08-19 DIAGNOSIS — N136 Pyonephrosis: Secondary | ICD-10-CM | POA: Diagnosis not present

## 2020-08-19 DIAGNOSIS — N179 Acute kidney failure, unspecified: Secondary | ICD-10-CM | POA: Diagnosis not present

## 2020-08-19 DIAGNOSIS — I5021 Acute systolic (congestive) heart failure: Secondary | ICD-10-CM | POA: Diagnosis not present

## 2020-08-19 DIAGNOSIS — Z466 Encounter for fitting and adjustment of urinary device: Secondary | ICD-10-CM | POA: Diagnosis not present

## 2020-08-19 DIAGNOSIS — I130 Hypertensive heart and chronic kidney disease with heart failure and stage 1 through stage 4 chronic kidney disease, or unspecified chronic kidney disease: Secondary | ICD-10-CM | POA: Diagnosis not present

## 2020-08-19 DIAGNOSIS — I6523 Occlusion and stenosis of bilateral carotid arteries: Secondary | ICD-10-CM | POA: Diagnosis not present

## 2020-08-19 DIAGNOSIS — D8685 Sarcoid myocarditis: Secondary | ICD-10-CM | POA: Diagnosis not present

## 2020-08-19 DIAGNOSIS — I10 Essential (primary) hypertension: Secondary | ICD-10-CM | POA: Diagnosis not present

## 2020-08-20 ENCOUNTER — Other Ambulatory Visit: Payer: Self-pay

## 2020-08-20 ENCOUNTER — Encounter: Payer: Self-pay | Admitting: Family

## 2020-08-20 ENCOUNTER — Ambulatory Visit: Payer: Medicare Other | Attending: Family | Admitting: Family

## 2020-08-20 ENCOUNTER — Encounter: Payer: Self-pay | Admitting: Pharmacist

## 2020-08-20 VITALS — BP 142/59 | HR 57 | Resp 18 | Ht <= 58 in | Wt 146.0 lb

## 2020-08-20 DIAGNOSIS — N189 Chronic kidney disease, unspecified: Secondary | ICD-10-CM | POA: Diagnosis not present

## 2020-08-20 DIAGNOSIS — Z8249 Family history of ischemic heart disease and other diseases of the circulatory system: Secondary | ICD-10-CM | POA: Insufficient documentation

## 2020-08-20 DIAGNOSIS — Z886 Allergy status to analgesic agent status: Secondary | ICD-10-CM | POA: Diagnosis not present

## 2020-08-20 DIAGNOSIS — Z888 Allergy status to other drugs, medicaments and biological substances status: Secondary | ICD-10-CM | POA: Insufficient documentation

## 2020-08-20 DIAGNOSIS — Z79890 Hormone replacement therapy (postmenopausal): Secondary | ICD-10-CM | POA: Insufficient documentation

## 2020-08-20 DIAGNOSIS — Z79899 Other long term (current) drug therapy: Secondary | ICD-10-CM | POA: Insufficient documentation

## 2020-08-20 DIAGNOSIS — I130 Hypertensive heart and chronic kidney disease with heart failure and stage 1 through stage 4 chronic kidney disease, or unspecified chronic kidney disease: Secondary | ICD-10-CM | POA: Diagnosis not present

## 2020-08-20 DIAGNOSIS — I10 Essential (primary) hypertension: Secondary | ICD-10-CM

## 2020-08-20 DIAGNOSIS — Z7951 Long term (current) use of inhaled steroids: Secondary | ICD-10-CM | POA: Diagnosis not present

## 2020-08-20 DIAGNOSIS — E079 Disorder of thyroid, unspecified: Secondary | ICD-10-CM | POA: Insufficient documentation

## 2020-08-20 DIAGNOSIS — M79645 Pain in left finger(s): Secondary | ICD-10-CM | POA: Diagnosis not present

## 2020-08-20 DIAGNOSIS — I5022 Chronic systolic (congestive) heart failure: Secondary | ICD-10-CM | POA: Diagnosis not present

## 2020-08-20 DIAGNOSIS — J449 Chronic obstructive pulmonary disease, unspecified: Secondary | ICD-10-CM | POA: Diagnosis not present

## 2020-08-20 DIAGNOSIS — Z885 Allergy status to narcotic agent status: Secondary | ICD-10-CM | POA: Insufficient documentation

## 2020-08-20 DIAGNOSIS — Z881 Allergy status to other antibiotic agents status: Secondary | ICD-10-CM | POA: Insufficient documentation

## 2020-08-20 NOTE — Progress Notes (Signed)
Patient ID: Stacey [name], female    DOB: April 14, 1945, 75 y.o.   MRN: [id number]  HPI  Stacey [name] is a 75 y/o female with a history of HTN, CKD, thyroid disease, COPD and chronic heart failure.   Echo report from 07/12/20 reviewed and showed an EF of 20-25% along with mild MR/AR/ AS.   Admitted 07/18/20 due to suprapubic pain with foley catheter in place. Diagnosed with UTI and placed on IV antibiotics. Renal ultrasound showed mild right hydronephrosis with left renal cyst. Discharged after 4 days. Admitted 07/12/20 due to acute on chronic HF with severe HTN. Cardiology consult obtained. Given IV lasix, IV NTG infusion and multiple medications for her HTN. Initially required bipap. Elevated troponins thought to be due to demand ischemia. Weaned off bipap/ oxygen. Failed voiding trial so foley inserted. Discharged after 3 days.   She presents today for a follow-up visit with a chief complaint of minimal fatigue upon moderate exertion. She describes this as chronic in nature having been present for several years. She has associated fatigue (mostly with the clonidine) and difficulty sleeping along with this. She denies nay dizziness, abdominal distention, palpitations, pedal edema, chest pain, shortness of breath, cough or weight gain.   Currently has a brace on her left thumb because of some pain with certain movements or if she bumps it up against anything. Has upcoming appointment scheduled with PCP.   Past Medical History:  Diagnosis Date  . CHF (congestive heart failure) ([place])   . Chronic kidney disease   . COPD (chronic obstructive pulmonary disease) ([place])   . Hypertension   . Sarcoidosis   . Thyroid disease    Past Surgical History:  Procedure Laterality Date  . CATARACT EXTRACTION    . EYE SURGERY Right 07/09/2019  . GALLBLADDER SURGERY    . KNEE SURGERY    . thyroidectomy     Family History  Problem Relation Age of Onset  . Hypertension Mother   . Diabetes Mother   . Anuerysm  Mother   . Diabetes Father   . Hypertension Father   . Prostate cancer Neg Hx   . Bladder Cancer Neg Hx   . Kidney cancer Neg Hx    Social History   Tobacco Use  . Smoking status: Never Smoker  . Smokeless tobacco: Never Used  Substance Use Topics  . Alcohol use: No   Allergies  Allergen Reactions  . Codeine Other (See Comments)    Other reaction(s): GI Intolerance Other Reaction: nausea   chest pain Nausea  Other reaction(s): GI Intolerance, Other (See Comments) Nausea GI Intolerance, nausea, chest pain   . Propoxyphene Other (See Comments)    Other Reaction: nausea and chest pain  . Alendronate     Other reaction(s): Other (See Comments) Chest pain  . Ciprofloxacin Rash  . Lisinopril     Other reaction(s): Cough  . Amlodipine Swelling  . Aspirin     Other reaction(s): Other (See Comments) Other reaction(s): Other (See Comments) Stomach hurt  . Methotrexate Hives  . Tramadol Nausea And Vomiting  . Vicodin [Hydrocodone-Acetaminophen] Nausea And Vomiting   Prior to Admission medications   Medication Sig Start Date End Date Taking? Authorizing Provider  acetaminophen (TYLENOL) 325 MG tablet Take 650 mg by mouth every 6 (six) hours as needed.   Yes [provider]  carvedilol (COREG) 6.25 MG tablet Take 1 tablet (6.25 mg total) by mouth 2 (two) times daily with a meal. 07/15/20  Yes Jennye Boroughs,  MD  cetirizine (ZYRTEC) 10 MG tablet Take 1 tablet (10 mg total) by mouth daily. 03/05/20  Yes Boscia, Greer Ee, NP  clobetasol ointment (TEMOVATE) 0.63 % Apply 1 application topically 2 (two) times daily as needed (rash).  02/06/16  Yes [provider]  cloNIDine (CATAPRES) 0.1 MG tablet Take 1 tablet (0.1 mg total) by mouth 2 (two) times daily. 05/09/19  Yes Boscia, Greer Ee, NP  cyclobenzaprine (FLEXERIL) 10 MG tablet Take 1 tablet (10 mg total) by mouth 3 (three) times daily as needed. 06/25/20  Yes Menshew, Dannielle Karvonen, PA-C  fluticasone (FLONASE) 50  MCG/ACT nasal spray Place 2 sprays into both nostrils daily. 03/23/20  Yes Luiz Ochoa, NP  furosemide (LASIX) 20 MG tablet Take 2 tablets (40 mg total) by mouth daily. 07/15/20  Yes Jennye Boroughs, MD  gabapentin (NEURONTIN) 100 MG capsule Take 2 capsules (200 mg total) by mouth at bedtime. 08/05/20  Yes [name], MD  hydrALAZINE (APRESOLINE) 50 MG tablet Take 2 tablets (100 mg total) by mouth 3 (three) times daily. 01/16/18  Yes Thurnell Lose, MD  latanoprost (XALATAN) 0.005 % ophthalmic solution Place 1 drop into both eyes at bedtime.  01/16/14  Yes [provider]  levothyroxine (SYNTHROID, LEVOTHROID) 88 MCG tablet Take 88 mcg by mouth daily before breakfast.  03/11/17  Yes [provider]  losartan (COZAAR) 100 MG tablet Take 1 tablet (100 mg total) by mouth daily. 04/28/20  Yes Luiz Ochoa, NP  montelukast (SINGULAIR) 10 MG tablet Take 1 tablet (10 mg total) by mouth daily. 04/28/20  Yes Luiz Ochoa, NP  Multiple Vitamins-Minerals (MULTIVITAMIN GUMMIES WOMENS) CHEW Chew 2 tablets by mouth daily.   Yes [provider]  pantoprazole (PROTONIX) 40 MG tablet TAKE 1 TABLET BY MOUTH AS NEEDED Patient taking differently: Take 40 mg by mouth daily. 04/18/19  Yes Boscia, Heather E, NP  pravastatin (PRAVACHOL) 20 MG tablet TAKE 1 TABLET(20 MG) BY MOUTH EVERY NIGHT Patient taking differently: Take 40 mg by mouth at bedtime. 08/19/19  Yes Boscia, Heather E, NP  sacubitril-valsartan (ENTRESTO) 24-26 MG Take 1 tablet by mouth 2 (two) times daily.   Yes [provider]  timolol (TIMOPTIC) 0.25 % ophthalmic solution Place 1 drop into both eyes 2 (two) times daily.  07/30/09  Yes [provider]  albuterol (VENTOLIN HFA) 108 (90 Base) MCG/ACT inhaler Inhale 2 puffs into the lungs every 6 (six) hours as needed for wheezing or shortness of breath.    [provider]  fluticasone (FLOVENT HFA) 110 MCG/ACT inhaler Inhale 1 puff into the lungs 2  (two) times daily.    [provider]  ondansetron (ZOFRAN ODT) 4 MG disintegrating tablet Take 1 tablet (4 mg total) by mouth every 8 (eight) hours as needed. Patient not taking: Reported on 08/20/2020 06/25/20   Menshew, Dannielle Karvonen, PA-C  potassium chloride SA (KLOR-CON) 20 MEQ tablet Take 1 tablet (20 mEq total) by mouth daily for 2 days. Patient not taking: Reported on 08/20/2020 07/29/20 07/31/20  Alisa Graff, FNP    Review of Systems  Constitutional: Positive for fatigue (with clonidine). Negative for appetite change.  HENT: Negative for congestion, postnasal drip and sore throat.   Eyes: Negative.   Respiratory: Negative for cough and shortness of breath.   Cardiovascular: Negative for chest pain, palpitations and leg swelling.  Gastrointestinal: Negative for abdominal distention.  Endocrine: Negative.   Genitourinary: Negative.   Musculoskeletal: Negative for back pain  and neck pain.  Skin: Negative.   Allergic/Immunologic: Negative.   Neurological: Negative for dizziness and light-headedness.  Hematological: Negative for adenopathy. Does not bruise/bleed easily.  Psychiatric/Behavioral: Positive for sleep disturbance (sleeping on 2 pillows; not sleeping well). Negative for dysphoric mood. The patient is not nervous/anxious.    Vitals:   08/20/20 1042  BP: (!) 142/59  Pulse: (!) 57  Resp: 18  SpO2: 96%  Weight: 146 lb (66.2 kg)  Height: 4\' 9"  (1.448 m)   Wt Readings from Last 3 Encounters:  08/20/20 146 lb (66.2 kg)  08/04/20 148 lb (67.1 kg)  07/29/20 147 lb 4 oz (66.8 kg)   Lab Results  Component Value Date   CREATININE 1.73 (H) 08/05/2020   CREATININE 1.53 (H) 07/29/2020   CREATININE 1.31 (H) 07/22/2020    Physical Exam Vitals and nursing note reviewed. Exam conducted with a chaperone present (son).  Constitutional:      Appearance: Normal appearance.  HENT:     Head: Normocephalic and atraumatic.  Cardiovascular:     Rate and Rhythm: Normal rate  and regular rhythm.  Pulmonary:     Effort: Pulmonary effort is normal. No respiratory distress.     Breath sounds: No wheezing or rales.  Abdominal:     General: There is no distension.     Palpations: Abdomen is soft.     Tenderness: There is no abdominal tenderness.  Musculoskeletal:        General: No tenderness.     Cervical back: Normal range of motion and neck supple.     Right lower leg: No edema.     Left lower leg: No edema.  Skin:    General: Skin is warm and dry.  Neurological:     General: No focal deficit present.     Mental Status: She is alert and oriented to person, place, and time.  Psychiatric:        Mood and Affect: Mood normal.        Behavior: Behavior normal.        Thought Content: Thought content normal.    Assessment & Plan:  1: Chronic heart failure with reduced ejection fraction- - NYHA class II - euvolemic today - weighing daily and she was reminded to call for an overnight weight gain of > 2 pounds or a weekly weight gain of > 5 pounds - weight down 1 pound from last visit here 6 weeks ago - not adding salt and has been reading food labels for sodium content - discussed how she could eat out and how to make lower sodium choices when she does eat out - saw cardiology [name]) 08/19/20 & was started on entresto 24/26mg ; returns in 1 month - she hasn't started it yet as she's picking it up in 3 days; instructed her that she needed to take entresto as 1 tablet BID and when she starts taking it, she will no longer take the losartan - [employer] patient assistance paperwork filled out for patient today for her entresto - depending on how BP responds to the entresto, will start decreasing clonidine at next visit - on GDMT of carvedilol and losartan - consider adding spironolactone/ SGLT2 if lab work allows in the future - HR today does not allow for carvedilol titration - participating in paramedicine program - BNP 07/18/20 was 68.3 - PharmD reconciled  medications with the patient  2: HTN with CKD- - BP mildly elevated today; will be starting entresto in a few days -  saw PCP Fulton Mole) 08/18/20 - BMP 08/05/20 reviewed and showed sodium 140, potassium 3.9, creatinine 1.73 and GFR 31    Patient did not bring her medications nor a list. Each medication was verbally reviewed with the patient and she was encouraged to bring the bottles to every visit to confirm accuracy of list.  Return in 6 weeks or sooner for any questions/problems before then.

## 2020-08-20 NOTE — Patient Instructions (Addendum)
Continue weighing daily and call for an overnight weight gain of > 2 pounds or a weekly weight gain of >5 pounds.   When you start entresto, you will take it as 1 tablet twice a day and you won't take losartan anymore

## 2020-08-20 NOTE — Progress Notes (Signed)
[facility] - PHARMACIST COUNSELING NOTE  Guideline-Directed Medical Therapy/Evidence Based Medicine  ACE/ARB/ARNI: Losartan 100 mg daily Beta Blocker: Carvedilol 6.125 mg twice daily Aldosterone Antagonist: None Diuretic: Furosemide 40 mg daily SGLT2i: None  Adherence Assessment  Do you ever forget to take your medication? [] Yes [x] No  Do you ever skip doses due to side effects? [] Yes [x] No  Do you have trouble affording your medicines? [] Yes [x] No  Are you ever unable to pick up your medication due to transportation difficulties? [] Yes [x] No  Do you ever stop taking your medications because you don't believe they are helping? [] Yes [x] No  Do you check your weight daily? [x] Yes [] No   Adherence strategy: Pillbox  Barriers to obtaining medications: N/A  Vital signs: HR 57, BP 142/59, weight (pounds) 146 ECHO: Date 07/12/2020, EF 20-25%, notes LV internal cavity size mildly dilated, mild MV regurgitation, mild AV regurgitation, mild AV stenosis  BMP Latest Ref Rng & Units 08/05/2020 07/29/2020 07/22/2020  Glucose 70 - 99 mg/dL 117(H) 172(H) 140(H)  BUN 8 - 23 mg/dL 30(H) 31(H) 21  Creatinine 0.44 - 1.00 mg/dL 1.73(H) 1.53(H) 1.31(H)  Sodium 135 - 145 mmol/L 140 135 138  Potassium 3.5 - 5.1 mmol/L 3.9 3.3(L) 4.9  Chloride 98 - 111 mmol/L 105 98 103  CO2 22 - 32 mmol/L 27 28 27   Calcium 8.9 - 10.3 mg/dL 11.1(H) 10.9(H) 11.3(H)    Past Medical History:  Diagnosis Date  . CHF (congestive heart failure) ([place])   . Chronic kidney disease   . COPD (chronic obstructive pulmonary disease) ([place])   . Hypertension   . Sarcoidosis   . Thyroid disease     ASSESSMENT 75 year old female who presents to the HF clinic for a follow up visit. Pt states she is tired by the end of the day. Pt denies any symptoms of hypotension. Pt states she has not had her inhalers in about a year and feels she needs at least one for when she is short of breath.  Pt recently had appt with the cardiologist who switched her from losartan to [facility] - pt reports she is still taking losartan and has not yet picked up the [facility]. Pt plans to pick it up after seeing her PCP on Monday (08/23/2020).   Recent ED Visit (past 6 months): Date - 07/18/2020, [facility] - UTI  PLAN CHF/HTN -continue hydralazine 100 mg three times daily, carvedilol 6.25 mg twice daily (monitor HR), clonidine 0.1 mg twice daily, furosemide 40 mg daily  -switch losartan 100 mg daily to Entresto 24-26 mg twice daily once picked up from pharmacy (instructed pt to stop losartan when she starts taking Entresto) -continue daily weight checks  Asthma -pt has not had her inhalers for ~1 year and feels that she needs them. Instructed pt to address with PCP at her appt on 08/23/2020  HLD - Continue pravastatin 40 mg at bedtime   Hypothyroidism -continue levothyroxine 88 mcg daily  -pt states she takes her levothyroxine and multivitamin separately. instructed pt to separate by 4 hours  Pain -continue acetaminophen as needed and gabapentin 200 mg at bedtime 3 -avoid use of NSAIDs  Glaucoma - Continue latanoprost and timolol eye drops   Acid reflux - Continue pantoprazole 40 mg daily    Time spent: 10 minutes  Sherilyn Banker, PharmD Pharmacy Resident  08/20/2020 11:30 AM    Current Outpatient Medications:  .  acetaminophen (TYLENOL) 325 MG tablet, Take 650 mg by mouth  every 6 (six) hours as needed., Disp: , Rfl:  .  albuterol (VENTOLIN HFA) 108 (90 Base) MCG/ACT inhaler, Inhale 2 puffs into the lungs every 6 (six) hours as needed for wheezing or shortness of breath., Disp: , Rfl:  .  carvedilol (COREG) 6.25 MG tablet, Take 1 tablet (6.25 mg total) by mouth 2 (two) times daily with a meal., Disp: 60 tablet, Rfl: 0 .  cetirizine (ZYRTEC) 10 MG tablet, Take 1 tablet (10 mg total) by mouth daily., Disp: 90 tablet, Rfl: 4 .  clobetasol ointment (TEMOVATE) 0.09 %, Apply 1 application  topically 2 (two) times daily as needed (rash). , Disp: , Rfl:  .  cloNIDine (CATAPRES) 0.1 MG tablet, Take 1 tablet (0.1 mg total) by mouth 2 (two) times daily., Disp: 180 tablet, Rfl: 4 .  cyclobenzaprine (FLEXERIL) 10 MG tablet, Take 1 tablet (10 mg total) by mouth 3 (three) times daily as needed., Disp: 30 tablet, Rfl: 0 .  fluticasone (FLONASE) 50 MCG/ACT nasal spray, Place 2 sprays into both nostrils daily., Disp: 16 g, Rfl: 6 .  fluticasone (FLOVENT HFA) 110 MCG/ACT inhaler, Inhale 1 puff into the lungs 2 (two) times daily., Disp: , Rfl:  .  furosemide (LASIX) 20 MG tablet, Take 2 tablets (40 mg total) by mouth daily., Disp: 30 tablet, Rfl: 0 .  gabapentin (NEURONTIN) 100 MG capsule, Take 2 capsules (200 mg total) by mouth at bedtime., Disp: , Rfl:  .  hydrALAZINE (APRESOLINE) 50 MG tablet, Take 2 tablets (100 mg total) by mouth 3 (three) times daily., Disp: 90 tablet, Rfl: 0 .  latanoprost (XALATAN) 0.005 % ophthalmic solution, Place 1 drop into both eyes at bedtime. , Disp: , Rfl:  .  levothyroxine (SYNTHROID, LEVOTHROID) 88 MCG tablet, Take 88 mcg by mouth daily before breakfast. , Disp: , Rfl: 1 .  losartan (COZAAR) 100 MG tablet, Take 1 tablet (100 mg total) by mouth daily., Disp: 90 tablet, Rfl: 1 .  montelukast (SINGULAIR) 10 MG tablet, Take 1 tablet (10 mg total) by mouth daily., Disp: 90 tablet, Rfl: 3 .  Multiple Vitamins-Minerals (MULTIVITAMIN GUMMIES WOMENS) CHEW, Chew 2 tablets by mouth daily., Disp: , Rfl:  .  ondansetron (ZOFRAN ODT) 4 MG disintegrating tablet, Take 1 tablet (4 mg total) by mouth every 8 (eight) hours as needed. (Patient not taking: Reported on 08/20/2020), Disp: 15 tablet, Rfl: 0 .  pantoprazole (PROTONIX) 40 MG tablet, TAKE 1 TABLET BY MOUTH AS NEEDED (Patient taking differently: Take 40 mg by mouth daily.), Disp: 90 tablet, Rfl: 2 .  potassium chloride SA (KLOR-CON) 20 MEQ tablet, Take 1 tablet (20 mEq total) by mouth daily for 2 days. (Patient not taking:  Reported on 08/20/2020), Disp: 2 tablet, Rfl: 0 .  pravastatin (PRAVACHOL) 20 MG tablet, TAKE 1 TABLET(20 MG) BY MOUTH EVERY NIGHT (Patient taking differently: Take 40 mg by mouth at bedtime.), Disp: 90 tablet, Rfl: 2 .  sacubitril-valsartan (ENTRESTO) 24-26 MG, Take 1 tablet by mouth 2 (two) times daily., Disp: , Rfl:  .  timolol (TIMOPTIC) 0.25 % ophthalmic solution, Place 1 drop into both eyes 2 (two) times daily. , Disp: , Rfl:    COUNSELING POINTS/CLINICAL PEARLS  Sacubitril-valsartan (Goal: 97-103 mg twice daily)  Warn female patient to avoid pregnancy during therapy and to report a pregnancy to a physician.  Advise patient to report symptomatic hypotension.  Side effects may include hyperkalemia, cough, dizziness, or renal failure.   DRUGS TO CAUTION IN HEART FAILURE  Drug or Class  Mechanism  Analgesics . NSAIDs . COX-2 inhibitors . Glucocorticoids  Sodium and water retention, increased systemic vascular resistance, decreased response to diuretics   Diabetes Medications . Metformin . Thiazolidinediones o Rosiglitazone (Avandia) o Pioglitazone (Actos) . DPP4 Inhibitors o Saxagliptin (Onglyza) o Sitagliptin (Januvia)   Lactic acidosis Possible calcium channel blockade   Unknown  Antiarrhythmics . Class I  o Flecainide o Disopyramide . Class III o Sotalol . Other o Dronedarone  Negative inotrope, proarrhythmic   Proarrhythmic, beta blockade  Negative inotrope  Antihypertensives . Alpha Blockers o Doxazosin . Calcium Channel Blockers o Diltiazem o Verapamil o Nifedipine . Central Alpha Adrenergics o Moxonidine . Peripheral Vasodilators o Minoxidil  Increases renin and aldosterone  Negative inotrope    Possible sympathetic withdrawal  Unknown  Anti-infective . Itraconazole . Amphotericin B  Negative inotrope Unknown  Hematologic . Anagrelide . Cilostazol   Possible inhibition of PD IV Inhibition of PD III causing arrhythmias   Neurologic/Psychiatric . Stimulants . Anti-Seizure Drugs o Carbamazepine o Pregabalin . Antidepressants o Tricyclics o Citalopram . Parkinsons o Bromocriptine o Pergolide o Pramipexole . Antipsychotics o Clozapine . Antimigraine o Ergotamine o Methysergide . Appetite suppressants . Bipolar o Lithium  Peripheral alpha and beta agonist activity  Negative inotrope and chronotrope Calcium channel blockade  Negative inotrope, proarrhythmic Dose-dependent QT prolongation  Excessive serotonin activity/valvular damage Excessive serotonin activity/valvular damage Unknown  IgE mediated hypersensitivy, calcium channel blockade  Excessive serotonin activity/valvular damage Excessive serotonin activity/valvular damage Valvular damage  Direct myofibrillar degeneration, adrenergic stimulation  Antimalarials . Chloroquine . Hydroxychloroquine Intracellular inhibition of lysosomal enzymes  Urologic Agents . Alpha Blockers o Doxazosin o Prazosin o Tamsulosin o Terazosin  Increased renin and aldosterone  Adapted from Page RL, et al. "Drugs That May Cause or Exacerbate Heart Failure: A Scientific Statement from the [place]." Circulation 2016; 532:D92-E26. DOI: 10.1161/CIR.0000000000000426   MEDICATION ADHERENCES TIPS AND STRATEGIES 1. Taking medication as prescribed improves patient outcomes in heart failure (reduces hospitalizations, improves symptoms, increases survival) 2. Side effects of medications can be managed by decreasing doses, switching agents, stopping drugs, or adding additional therapy. Please let someone in the [facility] know if you have having bothersome side effects so we can modify your regimen. Do not alter your medication regimen without talking to [place].  3. Medication reminders can help patients remember to take drugs on time. If you are missing or forgetting doses you can try linking behaviors, using pill boxes, or an  electronic reminder like an alarm on your phone or an app. Some people can also get automated phone calls as medication reminders.

## 2020-08-23 ENCOUNTER — Other Ambulatory Visit: Payer: Self-pay

## 2020-08-23 ENCOUNTER — Telehealth: Payer: Self-pay | Admitting: Family

## 2020-08-23 ENCOUNTER — Ambulatory Visit (INDEPENDENT_AMBULATORY_CARE_PROVIDER_SITE_OTHER): Payer: Medicare Other | Admitting: Physician Assistant

## 2020-08-23 ENCOUNTER — Encounter: Payer: Self-pay | Admitting: Physician Assistant

## 2020-08-23 DIAGNOSIS — M792 Neuralgia and neuritis, unspecified: Secondary | ICD-10-CM

## 2020-08-23 DIAGNOSIS — B028 Zoster with other complications: Secondary | ICD-10-CM

## 2020-08-23 DIAGNOSIS — E782 Mixed hyperlipidemia: Secondary | ICD-10-CM | POA: Diagnosis not present

## 2020-08-23 DIAGNOSIS — M79645 Pain in left finger(s): Secondary | ICD-10-CM | POA: Diagnosis not present

## 2020-08-23 DIAGNOSIS — J449 Chronic obstructive pulmonary disease, unspecified: Secondary | ICD-10-CM

## 2020-08-23 DIAGNOSIS — J309 Allergic rhinitis, unspecified: Secondary | ICD-10-CM | POA: Diagnosis not present

## 2020-08-23 DIAGNOSIS — I10 Essential (primary) hypertension: Secondary | ICD-10-CM

## 2020-08-23 MED ORDER — CETIRIZINE HCL 10 MG PO TABS: 10.0000 mg | ORAL_TABLET | Freq: Every day | ORAL | 4 refills | Status: DC

## 2020-08-23 MED ORDER — MONTELUKAST SODIUM 10 MG PO TABS
10.0000 mg | ORAL_TABLET | Freq: Every day | ORAL | 3 refills | Status: DC
Start: 2020-08-23 — End: 2021-07-15

## 2020-08-23 MED ORDER — FLUTICASONE PROPIONATE HFA 110 MCG/ACT IN AERO: 1.0000 | INHALATION_SPRAY | Freq: Two times a day (BID) | RESPIRATORY_TRACT | 3 refills | Status: DC

## 2020-08-23 MED ORDER — ALBUTEROL SULFATE HFA 108 (90 BASE) MCG/ACT IN AERS: 2.0000 | INHALATION_SPRAY | Freq: Four times a day (QID) | RESPIRATORY_TRACT | 3 refills | Status: DC | PRN

## 2020-08-23 MED ORDER — HYDRALAZINE HCL 50 MG PO TABS
100.0000 mg | ORAL_TABLET | Freq: Three times a day (TID) | ORAL | 2 refills | Status: DC
Start: 2020-08-23 — End: 2020-11-19

## 2020-08-23 MED ORDER — GABAPENTIN 100 MG PO CAPS: 200.0000 mg | ORAL_CAPSULE | Freq: Every day | ORAL | 2 refills | Status: DC

## 2020-08-23 NOTE — Progress Notes (Signed)
[facility] [place], [place] [address]  Internal MEDICINE  Office Visit Note  Patient Name: Stacey [name]  [id number]  [id number]  Date of Service: 08/25/2020  Chief Complaint  Patient presents with  . Follow-up    Lt thumb pain, med review and refills    HPI Pt is here for routine follow up. -She was hospitalized for 4 days after she couldn't breathe and was admitted 07/12/20 for CHF exacerbation, acute hypoxic respiratory failure, and hypertensive emergency. She then had a foley and was d/c with the foley and developed a UTI which put her back in the hospital on 07/18/20 for another 5 days. Reports she is doing much better now. Followed by urology who reviewed hospital course and imaging at OP visit. -Established with cardiology after--changing to entresto and will stop losartan once she starts this--waiting for auth. -Recently went to urgent care for red eye, and was given antibiotic for pink eye for 10 days--R eye. -left thumb hurts and gets stuck bent sometimes. Tender to touch, but voltaren gel helps. No numbness or tingling. Tried splints at night but did not help and made it hurt more. Possible trigger finger. Hx of carpal tunnel release on right and thought this felt similar, but contained only to thumb currently. Requesting ortho referral. Denies any known injury to the area. -Requesting medication refills  07/13/20 CXR: IMPRESSION: Probable pneumonia in each lung base. Lungs otherwise clear. Borderline cardiac enlargement. Aortic Atherosclerosis  07/18/20: US venous IMPRESSION: 1. No evidence of deep venous thrombosis within either lower Extremity.  07/20/20: US renal IMPRESSION: 1.  Mild right hydronephrosis. 2.  5 mm nonobstructing left renal calyceal stone. 3. Two simple left renal cysts, the largest measuring 1 cm. 9 mm hypoechoic focus with questionable internal echoes noted in the left kidney. MRI of the kidneys can be obtained to further  evaluate. 4. Bladder may be slightly distended with a volume of 646.3 cc. Patient unable to void. Ureteral jets not visualized. 5.  Fibroid uterus again noted.  07/21/20: MR abdomen WO contrast IMPRESSION: 1. The lesion of concern on prior ultrasound measures 6 by 7 by 8 mm in the left mid kidney, and has high T2 signal. The lesion is essentially not visible on T1 weighted images although this is ascribed to severe motion artifact and tiny size of the lesion. Enhancement characteristics are unknown as contrast was not administered. Statistically this small lesion is most likely to be a small minimally complex cyst based on the ultrasound appearance. The patient is unable to breath hold and accordingly multiple series are severely degraded by motion artifact. 2. Mild cardiomegaly with hazy opacities in the lower lobes probably a combination of atelectasis and potentially some low-grade edema or alveolitis. 3. 9 mm cystic lesion in the tail the pancreas. 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 Given the size of the lesion and the patient's age, follow up pancreatic protocol MRI is recommended in 2 years time. This recommendation follows ACR consensus guidelines: Management of Incidental Pancreatic Cysts: A White Paper of the ACR Incidental Findings Committee. [place] 8527;78:242-353.  Current Medication: Outpatient Encounter Medications as of 08/23/2020  Medication Sig Note  . acetaminophen (TYLENOL) 325 MG tablet Take 650 mg by mouth every 6 (six) hours as needed.   Marland Kitchen albuterol (VENTOLIN HFA) 108 (90 Base) MCG/ACT inhaler Inhale 2 puffs into the lungs every 6 (six) hours as needed for wheezing or shortness of breath.   Marland Kitchen  carvedilol (COREG) 6.25 MG tablet Take 1 tablet (6.25 mg total) by mouth 2 (two) times daily with a meal.   . cetirizine (ZYRTEC) 10 MG tablet Take 1 tablet (10 mg total) by mouth daily.   . clobetasol  ointment (TEMOVATE) 0.10 % Apply 1 application topically 2 (two) times daily as needed (rash).    . cloNIDine (CATAPRES) 0.1 MG tablet Take 1 tablet (0.1 mg total) by mouth 2 (two) times daily.   . cyclobenzaprine (FLEXERIL) 10 MG tablet Take 1 tablet (10 mg total) by mouth 3 (three) times daily as needed.   . fluticasone (FLONASE) 50 MCG/ACT nasal spray Place 2 sprays into both nostrils daily.   . fluticasone (FLOVENT HFA) 110 MCG/ACT inhaler Inhale 1 puff into the lungs 2 (two) times daily.   . furosemide (LASIX) 20 MG tablet Take 2 tablets (40 mg total) by mouth daily.   Marland Kitchen gabapentin (NEURONTIN) 100 MG capsule Take 2 capsules (200 mg total) by mouth at bedtime.   . hydrALAZINE (APRESOLINE) 50 MG tablet Take 2 tablets (100 mg total) by mouth 3 (three) times daily.   Marland Kitchen latanoprost (XALATAN) 0.005 % ophthalmic solution Place 1 drop into both eyes at bedtime.    Marland Kitchen levothyroxine (SYNTHROID, LEVOTHROID) 88 MCG tablet Take 88 mcg by mouth daily before breakfast.    . losartan (COZAAR) 100 MG tablet Take 1 tablet (100 mg total) by mouth daily.   . montelukast (SINGULAIR) 10 MG tablet Take 1 tablet (10 mg total) by mouth daily.   . Multiple Vitamins-Minerals (MULTIVITAMIN GUMMIES WOMENS) CHEW Chew 2 tablets by mouth daily.   . ondansetron (ZOFRAN ODT) 4 MG disintegrating tablet Take 1 tablet (4 mg total) by mouth every 8 (eight) hours as needed. (Patient not taking: Reported on 08/20/2020)   . pantoprazole (PROTONIX) 40 MG tablet TAKE 1 TABLET BY MOUTH AS NEEDED (Patient taking differently: Take 40 mg by mouth daily.)   . potassium chloride SA (KLOR-CON) 20 MEQ tablet Take 1 tablet (20 mEq total) by mouth daily for 2 days. (Patient not taking: Reported on 08/20/2020)   . pravastatin (PRAVACHOL) 20 MG tablet TAKE 1 TABLET(20 MG) BY MOUTH EVERY NIGHT (Patient taking differently: Take 40 mg by mouth at bedtime.)   . sacubitril-valsartan (ENTRESTO) 24-26 MG Take 1 tablet by mouth 2 (two) times daily. 08/20/2020:  Has not yet picked up from pharmacy; plans to pick up after seeing PCP on Monday (08/23/2020)  . timolol (TIMOPTIC) 0.25 % ophthalmic solution Place 1 drop into both eyes 2 (two) times daily.    . [DISCONTINUED] albuterol (VENTOLIN HFA) 108 (90 Base) MCG/ACT inhaler Inhale 2 puffs into the lungs every 6 (six) hours as needed for wheezing or shortness of breath. 08/20/2020: Pt has not had in 1 yr  . [DISCONTINUED] cetirizine (ZYRTEC) 10 MG tablet Take 1 tablet (10 mg total) by mouth daily.   . [DISCONTINUED] fluticasone (FLOVENT HFA) 110 MCG/ACT inhaler Inhale 1 puff into the lungs 2 (two) times daily. 08/20/2020: Pt has not had in 1 yr  . [DISCONTINUED] gabapentin (NEURONTIN) 100 MG capsule Take 2 capsules (200 mg total) by mouth at bedtime.   . [DISCONTINUED] hydrALAZINE (APRESOLINE) 50 MG tablet Take 2 tablets (100 mg total) by mouth 3 (three) times daily.   . [DISCONTINUED] montelukast (SINGULAIR) 10 MG tablet Take 1 tablet (10 mg total) by mouth daily.    No facility-administered encounter medications on file as of 08/23/2020.    Surgical History: Past Surgical History:  Procedure Laterality  Date  . CATARACT EXTRACTION    . EYE SURGERY Right 07/09/2019  . GALLBLADDER SURGERY    . KNEE SURGERY    . thyroidectomy      Medical History: Past Medical History:  Diagnosis Date  . CHF (congestive heart failure) ([place])   . Chronic kidney disease   . COPD (chronic obstructive pulmonary disease) ([place])   . Hypertension   . Sarcoidosis   . Thyroid disease     Family History: Family History  Problem Relation Age of Onset  . Hypertension Mother   . Diabetes Mother   . Anuerysm Mother   . Diabetes Father   . Hypertension Father   . Prostate cancer Neg Hx   . Bladder Cancer Neg Hx   . Kidney cancer Neg Hx     Social History   Socioeconomic History  . Marital status: Married    Spouse name: Not on file  . Number of children: Not on file  . Years of education: Not on file  . Highest  education level: Not on file  Occupational History  . Not on file  Tobacco Use  . Smoking status: Never Smoker  . Smokeless tobacco: Never Used  Vaping Use  . Vaping Use: Never used  Substance and Sexual Activity  . Alcohol use: No  . Drug use: Not Currently  . Sexual activity: Not on file  Other Topics Concern  . Not on file  Social History Narrative  . Not on file   Social Determinants of Health   Financial Resource Strain: Not on file  Food Insecurity: Not on file  Transportation Needs: Not on file  Physical Activity: Not on file  Stress: Not on file  Social Connections: Not on file  Intimate Partner Violence: Not on file      Review of Systems  Constitutional: Positive for fatigue. Negative for chills and unexpected weight change.  HENT: Negative for congestion, postnasal drip, rhinorrhea, sneezing and sore throat.   Eyes: Positive for redness and itching. Negative for pain and visual disturbance.  Respiratory: Negative for cough, chest tightness and shortness of breath.   Cardiovascular: Negative for chest pain and palpitations.  Gastrointestinal: Negative for abdominal pain, constipation, diarrhea, nausea and vomiting.  Genitourinary: Negative for dysuria and frequency.  Musculoskeletal: Positive for arthralgias. Negative for back pain, joint swelling and neck pain.  Skin: Negative for rash.  Neurological: Negative.  Negative for tremors and numbness.  Hematological: Negative for adenopathy. Does not bruise/bleed easily.  Psychiatric/Behavioral: Negative for behavioral problems (Depression), sleep disturbance and suicidal ideas. The patient is not nervous/anxious.     Vital Signs: BP 126/70   Pulse 60   Temp 98.4 F (36.9 C)   Resp 16   Ht 4\' 9"  (1.448 m)   Wt 146 lb 3.2 oz (66.3 kg)   SpO2 93%   BMI 31.64 kg/m    Physical Exam Vitals and nursing note reviewed.  Constitutional:      General: She is not in acute distress.    Appearance: She is  well-developed. She is obese. She is not diaphoretic.  HENT:     Head: Normocephalic and atraumatic.     Mouth/Throat:     Pharynx: No oropharyngeal exudate.  Eyes:     General:        Right eye: No discharge.        Left eye: No discharge.     Extraocular Movements: Extraocular movements intact.     Pupils: Pupils are equal,  round, and reactive to light.  Neck:     Thyroid: No thyromegaly.     Vascular: No JVD.     Trachea: No tracheal deviation.  Cardiovascular:     Rate and Rhythm: Normal rate and regular rhythm.     Heart sounds: Normal heart sounds. No murmur heard. No friction rub. No gallop.   Pulmonary:     Effort: Pulmonary effort is normal. No respiratory distress.     Breath sounds: No wheezing or rales.  Chest:     Chest wall: No tenderness.  Abdominal:     General: Bowel sounds are normal.     Palpations: Abdomen is soft.  Musculoskeletal:        General: Normal range of motion.     Cervical back: Normal range of motion and neck supple.  Lymphadenopathy:     Cervical: No cervical adenopathy.  Skin:    General: Skin is warm and dry.  Neurological:     Mental Status: She is alert and oriented to person, place, and time.     Cranial Nerves: No cranial nerve deficit.  Psychiatric:        Behavior: Behavior normal.        Thought Content: Thought content normal.        Judgment: Judgment normal.        Assessment/Plan: 1. Essential hypertension Blood pressure under much better control today, will continue current therapy as adjusted by cardiology.  Patient has follow-up with cardiology as well.  Requested hydralazine refill today - hydrALAZINE (APRESOLINE) 50 MG tablet; Take 2 tablets (100 mg total) by mouth 3 (three) times daily.  Dispense: 180 tablet; Refill: 2  2. Thumb pain, left Patient requested referral to orthopedics due to ongoing left thumb pain, referral placed.  May continue topical Voltaren gel and splint use as needed - AMB referral to  orthopedics  3. Chronic obstructive pulmonary disease, unspecified COPD type ([place]) Stable, refill sent for inhalers - albuterol (VENTOLIN HFA) 108 (90 Base) MCG/ACT inhaler; Inhale 2 puffs into the lungs every 6 (six) hours as needed for wheezing or shortness of breath.  Dispense: 18 g; Refill: 3 - fluticasone (FLOVENT HFA) 110 MCG/ACT inhaler; Inhale 1 puff into the lungs 2 (two) times daily.  Dispense: 1 each; Refill: 3  4. Neuralgia and neuritis May continue gabapentin - gabapentin (NEURONTIN) 100 MG capsule; Take 2 capsules (200 mg total) by mouth at bedtime.  Dispense: 60 capsule; Refill: 2  5. Mixed hyperlipidemia Continue pravastatin  6. Allergic rhinitis, unspecified seasonality, unspecified trigger Refill sent for Singulair and Zyrtec - montelukast (SINGULAIR) 10 MG tablet; Take 1 tablet (10 mg total) by mouth daily.  Dispense: 90 tablet; Refill: 3 - cetirizine (ZYRTEC) 10 MG tablet; Take 1 tablet (10 mg total) by mouth daily.  Dispense: 90 tablet; Refill: 4   General Counseling: nazariah cadet understanding of the findings of todays visit and agrees with plan of treatment. I have discussed any further diagnostic evaluation that may be needed or ordered today. We also reviewed her medications today. she has been encouraged to call the office with any questions or concerns that should arise related to todays visit.    Orders Placed This Encounter  Procedures  . AMB referral to orthopedics    Meds ordered this encounter  Medications  . albuterol (VENTOLIN HFA) 108 (90 Base) MCG/ACT inhaler    Sig: Inhale 2 puffs into the lungs every 6 (six) hours as needed for wheezing or shortness of breath.  Dispense:  18 g    Refill:  3  . fluticasone (FLOVENT HFA) 110 MCG/ACT inhaler    Sig: Inhale 1 puff into the lungs 2 (two) times daily.    Dispense:  1 each    Refill:  3  . gabapentin (NEURONTIN) 100 MG capsule    Sig: Take 2 capsules (200 mg total) by mouth at bedtime.     Dispense:  60 capsule    Refill:  2  . hydrALAZINE (APRESOLINE) 50 MG tablet    Sig: Take 2 tablets (100 mg total) by mouth 3 (three) times daily.    Dispense:  180 tablet    Refill:  2    Please fill as 90 day prescription  . montelukast (SINGULAIR) 10 MG tablet    Sig: Take 1 tablet (10 mg total) by mouth daily.    Dispense:  90 tablet    Refill:  3  . cetirizine (ZYRTEC) 10 MG tablet    Sig: Take 1 tablet (10 mg total) by mouth daily.    Dispense:  90 tablet    Refill:  4    Please fill as 90 day prescription when time to fill. Thanks.    This patient was seen by Drema Dallas, PA-C in collaboration with Dr. Clayborn Bigness as a part of collaborative care agreement.   Total time spent:30 Minutes Time spent includes review of chart, medications, test results, and follow up plan with the patient.      Dr [name] Internal medicine

## 2020-08-23 NOTE — Telephone Encounter (Signed)
Called to notify patient that she has been approved for patient assistance for entresto till the end of the year! Gave patient instructions on how to set it up.  Stacey [name], NT

## 2020-08-24 DIAGNOSIS — I130 Hypertensive heart and chronic kidney disease with heart failure and stage 1 through stage 4 chronic kidney disease, or unspecified chronic kidney disease: Secondary | ICD-10-CM | POA: Diagnosis not present

## 2020-08-24 DIAGNOSIS — N179 Acute kidney failure, unspecified: Secondary | ICD-10-CM | POA: Diagnosis not present

## 2020-08-24 DIAGNOSIS — B9689 Other specified bacterial agents as the cause of diseases classified elsewhere: Secondary | ICD-10-CM | POA: Diagnosis not present

## 2020-08-24 DIAGNOSIS — N136 Pyonephrosis: Secondary | ICD-10-CM | POA: Diagnosis not present

## 2020-08-24 DIAGNOSIS — T83511D Infection and inflammatory reaction due to indwelling urethral catheter, subsequent encounter: Secondary | ICD-10-CM | POA: Diagnosis not present

## 2020-08-24 DIAGNOSIS — Z466 Encounter for fitting and adjustment of urinary device: Secondary | ICD-10-CM | POA: Diagnosis not present

## 2020-08-26 ENCOUNTER — Other Ambulatory Visit: Payer: Self-pay

## 2020-08-26 ENCOUNTER — Encounter: Payer: Self-pay | Admitting: Physician Assistant

## 2020-08-26 ENCOUNTER — Ambulatory Visit (INDEPENDENT_AMBULATORY_CARE_PROVIDER_SITE_OTHER): Payer: Medicare Other | Admitting: Physician Assistant

## 2020-08-26 DIAGNOSIS — J449 Chronic obstructive pulmonary disease, unspecified: Secondary | ICD-10-CM | POA: Diagnosis not present

## 2020-08-26 DIAGNOSIS — Z20822 Contact with and (suspected) exposure to covid-19: Secondary | ICD-10-CM

## 2020-08-26 MED ORDER — BENZONATATE 100 MG PO CAPS: 100.0000 mg | ORAL_CAPSULE | Freq: Two times a day (BID) | ORAL | 0 refills | Status: DC | PRN

## 2020-08-26 MED ORDER — AZITHROMYCIN 250 MG PO TABS: 250.0000 mg | ORAL_TABLET | Freq: Every day | ORAL | 0 refills | Status: DC

## 2020-08-26 MED ORDER — PREDNISONE 10 MG PO TABS: ORAL_TABLET | ORAL | 0 refills | Status: DC

## 2020-08-26 NOTE — Progress Notes (Signed)
[facility] [place], [place] [address]  Internal MEDICINE  Telephone Visit  Patient Name: Stacey [name]  [id number]  [id number]  Date of Service: 08/26/2020  I connected with the patient at 2:26 by telephone and verified the patients identity using two identifiers.   I discussed the limitations, risks, security and privacy concerns of performing an evaluation and management service by telephone and the availability of in person appointments. I also discussed with the patient that there may be a patient responsible charge related to the service.  The patient expressed understanding and agrees to proceed.    Chief Complaint  Patient presents with   Telephone Assessment    [phone]   Telephone Screen   Sore Throat   Diarrhea   Cough    HPI Pt is here for a virtual sick visit. -She has had a sore throat for 2 days and diarrhea and cough as well. Denies headaches, fevers, chills, body aches, wheezing, SOB, or CP. Husband was also sick with the same thing but is better now. His symptoms lasted about 4 days.  -She is vaccinated with booster. Has not tested for covid and urged her to do so now. She agreed and will call with results.  Current Medication: Outpatient Encounter Medications as of 08/26/2020  Medication Sig Note   acetaminophen (TYLENOL) 325 MG tablet Take 650 mg by mouth every 6 (six) hours as needed.    albuterol (VENTOLIN HFA) 108 (90 Base) MCG/ACT inhaler Inhale 2 puffs into the lungs every 6 (six) hours as needed for wheezing or shortness of breath.    benzonatate (TESSALON) 100 MG capsule Take 1 capsule (100 mg total) by mouth 2 (two) times daily as needed for cough.    carvedilol (COREG) 6.25 MG tablet Take 1 tablet (6.25 mg total) by mouth 2 (two) times daily with a meal.    cetirizine (ZYRTEC) 10 MG tablet Take 1 tablet (10 mg total) by mouth daily.    clobetasol ointment (TEMOVATE) 8.84 % Apply 1 application topically 2 (two) times daily as  needed (rash).     cloNIDine (CATAPRES) 0.1 MG tablet Take 1 tablet (0.1 mg total) by mouth 2 (two) times daily.    cyclobenzaprine (FLEXERIL) 10 MG tablet Take 1 tablet (10 mg total) by mouth 3 (three) times daily as needed.    fluticasone (FLONASE) 50 MCG/ACT nasal spray Place 2 sprays into both nostrils daily.    fluticasone (FLOVENT HFA) 110 MCG/ACT inhaler Inhale 1 puff into the lungs 2 (two) times daily.    furosemide (LASIX) 20 MG tablet Take 2 tablets (40 mg total) by mouth daily.    gabapentin (NEURONTIN) 100 MG capsule Take 2 capsules (200 mg total) by mouth at bedtime.    hydrALAZINE (APRESOLINE) 50 MG tablet Take 2 tablets (100 mg total) by mouth 3 (three) times daily.    latanoprost (XALATAN) 0.005 % ophthalmic solution Place 1 drop into both eyes at bedtime.     levothyroxine (SYNTHROID, LEVOTHROID) 88 MCG tablet Take 88 mcg by mouth daily before breakfast.     losartan (COZAAR) 100 MG tablet Take 1 tablet (100 mg total) by mouth daily.    montelukast (SINGULAIR) 10 MG tablet Take 1 tablet (10 mg total) by mouth daily.    Multiple Vitamins-Minerals (MULTIVITAMIN GUMMIES WOMENS) CHEW Chew 2 tablets by mouth daily.    ondansetron (ZOFRAN ODT) 4 MG disintegrating tablet Take 1 tablet (4 mg total) by mouth every 8 (eight) hours as needed.  pantoprazole (PROTONIX) 40 MG tablet TAKE 1 TABLET BY MOUTH AS NEEDED (Patient taking differently: Take 40 mg by mouth daily.)    pravastatin (PRAVACHOL) 20 MG tablet TAKE 1 TABLET(20 MG) BY MOUTH EVERY NIGHT (Patient taking differently: Take 40 mg by mouth at bedtime.)    sacubitril-valsartan (ENTRESTO) 24-26 MG Take 1 tablet by mouth 2 (two) times daily. 08/20/2020: Has not yet picked up from pharmacy; plans to pick up after seeing PCP on Monday (08/23/2020)   timolol (TIMOPTIC) 0.25 % ophthalmic solution Place 1 drop into both eyes 2 (two) times daily.     potassium chloride SA (KLOR-CON) 20 MEQ tablet Take 1 tablet (20 mEq total) by mouth daily for 2  days. (Patient not taking: Reported on 08/20/2020)    No facility-administered encounter medications on file as of 08/26/2020.    Surgical History: Past Surgical History:  Procedure Laterality Date   CATARACT EXTRACTION     EYE SURGERY Right 07/09/2019   GALLBLADDER SURGERY     KNEE SURGERY     thyroidectomy      Medical History: Past Medical History:  Diagnosis Date   CHF (congestive heart failure) ([facility])    Chronic kidney disease    COPD (chronic obstructive pulmonary disease) ([facility])    Hypertension    Sarcoidosis    Thyroid disease     Family History: Family History  Problem Relation Age of Onset   Hypertension Mother    Diabetes Mother    Anuerysm Mother    Diabetes Father    Hypertension Father    Prostate cancer Neg Hx    Bladder Cancer Neg Hx    Kidney cancer Neg Hx     Social History   Socioeconomic History   Marital status: Married    Spouse name: Not on file   Number of children: Not on file   Years of education: Not on file   Highest education level: Not on file  Occupational History   Not on file  Tobacco Use   Smoking status: Never   Smokeless tobacco: Never  Vaping Use   Vaping Use: Never used  Substance and Sexual Activity   Alcohol use: No   Drug use: Not Currently   Sexual activity: Not on file  Other Topics Concern   Not on file  Social History Narrative   Not on file   Social Determinants of Health   Financial Resource Strain: Not on file  Food Insecurity: Not on file  Transportation Needs: Not on file  Physical Activity: Not on file  Stress: Not on file  Social Connections: Not on file  Intimate Partner Violence: Not on file      Review of Systems  Constitutional:  Negative for chills, fatigue and fever.  HENT:  Positive for postnasal drip and sore throat. Negative for congestion and mouth sores.   Respiratory:  Positive for cough. Negative for shortness of breath and wheezing.   Cardiovascular:  Negative for chest pain.   Gastrointestinal:  Positive for diarrhea.  Genitourinary:  Negative for flank pain.  Musculoskeletal:  Negative for myalgias.  Neurological:  Negative for headaches.  Psychiatric/Behavioral: Negative.     Vital Signs: Temp (!) 97 F (36.1 C)   Ht 4\' 9"  (1.448 m)   Wt 143 lb (64.9 kg)   BMI 30.94 kg/m    Observation/Objective:  Pt able to carry out conversation   Assessment/Plan: 1. Suspected COVID-19 virus infection Patient will get tested for COVID and call office with results.  Will send Tessalon Pearls to help with cough and advised patient to use Flonase and Mucinex for postnasal drip and any congestion.  Educated to drink warm tea with honey as well as use lozenges to help with sore throat.  Patient additionally educated to stay well-hydrated and get rest. - benzonatate (TESSALON) 100 MG capsule; Take 1 capsule (100 mg total) by mouth 2 (two) times daily as needed for cough.  Dispense: 20 capsule; Refill: 0   General Counseling: annasofia pohl understanding of the findings of today's phone visit and agrees with plan of treatment. I have discussed any further diagnostic evaluation that may be needed or ordered today. We also reviewed her medications today. she has been encouraged to call the office with any questions or concerns that should arise related to todays visit.    No orders of the defined types were placed in this encounter.   Meds ordered this encounter  Medications   benzonatate (TESSALON) 100 MG capsule    Sig: Take 1 capsule (100 mg total) by mouth 2 (two) times daily as needed for cough.    Dispense:  20 capsule    Refill:  0    Time spent:30 Minutes    Dr [name] Internal medicine

## 2020-09-02 DIAGNOSIS — B9689 Other specified bacterial agents as the cause of diseases classified elsewhere: Secondary | ICD-10-CM | POA: Diagnosis not present

## 2020-09-02 DIAGNOSIS — N136 Pyonephrosis: Secondary | ICD-10-CM | POA: Diagnosis not present

## 2020-09-02 DIAGNOSIS — Z466 Encounter for fitting and adjustment of urinary device: Secondary | ICD-10-CM | POA: Diagnosis not present

## 2020-09-02 DIAGNOSIS — I130 Hypertensive heart and chronic kidney disease with heart failure and stage 1 through stage 4 chronic kidney disease, or unspecified chronic kidney disease: Secondary | ICD-10-CM | POA: Diagnosis not present

## 2020-09-02 DIAGNOSIS — N179 Acute kidney failure, unspecified: Secondary | ICD-10-CM | POA: Diagnosis not present

## 2020-09-02 DIAGNOSIS — T83511D Infection and inflammatory reaction due to indwelling urethral catheter, subsequent encounter: Secondary | ICD-10-CM | POA: Diagnosis not present

## 2020-09-06 ENCOUNTER — Telehealth: Payer: Self-pay

## 2020-09-06 NOTE — Telephone Encounter (Signed)
Faxed referral to [facility] at [phone]. Loma Sousa

## 2020-09-07 ENCOUNTER — Other Ambulatory Visit (HOSPITAL_COMMUNITY): Payer: Self-pay

## 2020-09-07 ENCOUNTER — Encounter (HOSPITAL_COMMUNITY): Payer: Self-pay

## 2020-09-07 NOTE — Progress Notes (Signed)
Had a home visit with Stacey [name].  She is feeling better since having the COVID.  She denies any problems today such as chest pain, headaches or increased shortness of breath.  She has all her medications, she has started [place].  Also called to [facility] and they will shipping it.  She has no edema in extremities, abdomen is soft.  She is aware of up coming appts.  Will continue to visit for heart failure, diet and medication management.   [place] [phone]

## 2020-09-10 DIAGNOSIS — M8588 Other specified disorders of bone density and structure, other site: Secondary | ICD-10-CM | POA: Diagnosis not present

## 2020-09-15 DIAGNOSIS — D8685 Sarcoid myocarditis: Secondary | ICD-10-CM | POA: Diagnosis not present

## 2020-09-15 DIAGNOSIS — I10 Essential (primary) hypertension: Secondary | ICD-10-CM | POA: Diagnosis not present

## 2020-09-15 DIAGNOSIS — I420 Dilated cardiomyopathy: Secondary | ICD-10-CM | POA: Diagnosis not present

## 2020-09-15 DIAGNOSIS — I119 Hypertensive heart disease without heart failure: Secondary | ICD-10-CM | POA: Diagnosis not present

## 2020-09-21 ENCOUNTER — Telehealth: Payer: Self-pay | Admitting: Pharmacist

## 2020-09-21 NOTE — Progress Notes (Signed)
Chronic Care Management Pharmacy Note  09/22/2020 Name:  Stacey [name] MRN:  [id number] DOB:  11-29-1945  Summary: Initial visit with PharmD.  She is not using any inhalers due to cost.  Getting Entresto through cardiology office for free.  Reports her main goal is to stay out of the hospital.  She is now on proper HF meds [name]) - would benefit from having inhalers on hand.  Recommendations/Changes made from today's visit: Help with financial burden of inhalers  Plan: FU 3 months on inhalers Weight monitoring FU initiated   Subjective: Stacey [name] is an 75 y.o. year old female who is a primary patient of Humphrey Rolls, Timoteo Gaul, MD.  The CCM team was consulted for assistance with disease management and care coordination needs.    Engaged with patient face to face for initial visit in response to provider referral for pharmacy case management and/or care coordination services.   Consent to Services:  The patient was given the following information about Chronic Care Management services today, agreed to services, and gave verbal consent: 1. CCM service includes personalized support from designated clinical staff supervised by the primary care provider, including individualized plan of care and coordination with other care providers 2. 24/7 contact phone numbers for assistance for urgent and routine care needs. 3. Service will only be billed when office clinical staff spend 20 minutes or more in a month to coordinate care. 4. Only one practitioner may furnish and bill the service in a calendar month. 5.The patient may stop CCM services at any time (effective at the end of the month) by phone call to the office staff. 6. The patient will be responsible for cost sharing (co-pay) of up to 20% of the service fee (after annual deductible is met). Patient agreed to services and consent obtained.  Patient Care Team: [name], MD as PCP - General (Internal Medicine) Edythe Clarity, [facility] as  Pharmacist (Pharmacist)  Recent office visits:  08/26/20 Mylinda Latina, PA-C. For COVID-19. STARTED Benzonatate 100 mg 2 times daily PRN. 08/23/20 McDonough, Si Gaul, PA-C. For follow-up. No medication changes. 06/14/20 McDonough, Si Gaul, PA-C. For follow-up. Per note: BP still elevated at home and in office, will have pt take 2 tabs of hydralazine TID and continue other medications as before. 05/17/20 McDonough, Si Gaul, PA-C. For follow-up. No medication changes.    Recent consult visits:  08/20/20 Cardiology Alisa Graff, FNP. Per note: When you start entresto, you will take it as 1 tablet twice a day and you won't take losartan anymore 08/19/20 Cardiology Flossie Dibble, MD. No medication changes. 07/29/20 Cardiology Alisa Graff, FNP. (Telephone) STARTED Potassium Chloride Crys ER 20 MEQ. 07/29/20 Cardiology Alisa Graff, FNP. For chronic systolic heart failure. No medication changes. 07/26/20 Urology Billey Co, MD. For urinary retention. STOPPED Albuterol, Fluticasone and cyclosporine. 07/05/20 Ophthalmology Lad, Caesar Bookman, MD. No medication changes. 04/29/20 Ophthalmology Darrin Luis, Karis Juba. MD. No medication changes. 04/06/20 Endocrinology Solum, Betsey Holiday. MD. No information available. 03/30/20 Endocrinology Solum, Betsey Holiday. MD. No information available.   Hospital visits:  07/18/20 [facility] (4 days)Kc, Maren Beach, MD. For UTI. STOPPED Hydrocodone-Acetaminophen. 07/12/20 [facility] (3 days) Jennye Boroughs, MD.STOPPED Colchicine, Ketorolac, Naproxen. 06/25/20 [facility] Emergency Department ( 2 Hours) Naaman Plummer, MD. For back pain. STARTED Cyclobenzaprine 10 mg 3 times daily PRN, Hydrocodone-Acetaminophen 5-325 MG 1 tablet 3 times daily PRN, Ketorolac Tromethamine 10 mg every 8 hours  and Ondansetron 4 mg every 8 hours PRN.   Medication History: Gabapentin 100 mg 30 DS 09/19/20 Losartan 100 mg 90 DS  07/27/20 Pravastatin 20 mg 90 DS 08/11/20   Objective:  Lab Results  Component Value Date   CREATININE 1.73 (H) 08/05/2020   BUN 30 (H) 08/05/2020   GFRNONAA 31 (L) 08/05/2020   GFRAA 37 (L) 01/16/2018   NA 140 08/05/2020   K 3.9 08/05/2020   CALCIUM 11.1 (H) 08/05/2020   CO2 27 08/05/2020   GLUCOSE 117 (H) 08/05/2020    Lab Results  Component Value Date/Time   HGBA1C 6.2 (H) 07/13/2020 04:53 AM    Last diabetic Eye exam: No results found for: HMDIABEYEEXA  Last diabetic Foot exam: No results found for: HMDIABFOOTEX   Lab Results  Component Value Date   CHOL 143 07/13/2020   HDL 49 07/13/2020   LDLCALC 83 07/13/2020   TRIG 57 07/13/2020   CHOLHDL 2.9 07/13/2020    Hepatic Function Latest Ref Rng & Units 07/20/2020 07/18/2020 07/12/2020  Total Protein 6.5 - 8.1 g/dL - 7.7 7.9  Albumin 3.5 - 5.0 g/dL 2.9(L) 3.6 3.7  AST 15 - 41 U/L - 31 36  ALT 0 - 44 U/L - 27 25  Alk Phosphatase 38 - 126 U/L - 65 73  Total Bilirubin 0.3 - 1.2 mg/dL - 0.8 0.9  Bilirubin, Direct 0.0 - 0.2 mg/dL - - -    Lab Results  Component Value Date/Time   TSH 1.422 01/13/2018 01:22 AM    CBC Latest Ref Rng & Units 07/29/2020 07/19/2020 07/18/2020  WBC 4.0 - 10.5 K/uL 4.7 9.5 11.5(H)  Hemoglobin 12.0 - 15.0 g/dL 11.8(L) 12.0 13.0  Hematocrit 36.0 - 46.0 % 36.8 36.1 38.8  Platelets 150 - 400 K/uL 315 193 235    No results found for: VD25OH  Clinical ASCVD: No  The 10-year ASCVD risk score Mikey Bussing DC Jr., et al., 2013) is: 7.4%   Values used to calculate the score:     Age: 62 years     Sex: Female     Is Non-Hispanic African American: Yes     Diabetic: No     Tobacco smoker: No     Systolic Blood Pressure: 161 mmHg     Is BP treated: Yes     HDL Cholesterol: 49 mg/dL     Total Cholesterol: 143 mg/dL    Depression screen [facility] 2/9 06/14/2020 05/17/2020 02/09/2020  Decreased Interest 0 0 0  Down, Depressed, Hopeless 0 0 0  PHQ - 2 Score 0 0 0     Social History   Tobacco Use  Smoking  Status Never  Smokeless Tobacco Never   BP Readings from Last 3 Encounters:  09/07/20 110/70  08/23/20 126/70  08/20/20 (!) 142/59   Pulse Readings from Last 3 Encounters:  09/07/20 68  08/23/20 60  08/20/20 (!) 57   Wt Readings from Last 3 Encounters:  09/22/20 143 lb (64.9 kg)  09/07/20 142 lb (64.4 kg)  08/26/20 143 lb (64.9 kg)   BMI Readings from Last 3 Encounters:  09/22/20 30.94 kg/m  09/07/20 30.73 kg/m  08/26/20 30.94 kg/m    Assessment/Interventions: Review of patient past medical history, allergies, medications, health status, including review of consultants reports, laboratory and other test data, was performed as part of comprehensive evaluation and provision of chronic care management services.   SDOH:  (Social Determinants of Health) assessments and interventions performed: Yes  Financial Resource Strain: Low Risk  Difficulty of Paying Living Expenses: Not very hard    SDOH Screenings   Alcohol Screen: Low Risk    Last Alcohol Screening Score (AUDIT): 0  Depression (PHQ2-9): Low Risk    PHQ-2 Score: 0  Financial Resource Strain: Low Risk    Difficulty of Paying Living Expenses: Not very hard  Food Insecurity: Not on file  Housing: Not on file  Physical Activity: Not on file  Social Connections: Not on file  Stress: Not on file  Tobacco Use: Low Risk    Smoking Tobacco Use: Never   Smokeless Tobacco Use: Never  Transportation Needs: Not on file    CCM Care Plan  Allergies  Allergen Reactions   Codeine Other (See Comments)    Other reaction(s): GI Intolerance Other Reaction: nausea   chest pain Nausea  Other reaction(s): GI Intolerance, Other (See Comments) Nausea GI Intolerance, nausea, chest pain    Propoxyphene Other (See Comments)    Other Reaction: nausea and chest pain   Alendronate     Other reaction(s): Other (See Comments) Chest pain   Ciprofloxacin Rash   Lisinopril     Other reaction(s): Cough   Amlodipine Swelling    Aspirin     Other reaction(s): Other (See Comments) Other reaction(s): Other (See Comments) Stomach hurt   Methotrexate Hives   Tramadol Nausea And Vomiting   Vicodin [Hydrocodone-Acetaminophen] Nausea And Vomiting    Medications Reviewed Today     Reviewed by Edythe Clarity, [facility] (Pharmacist) on 09/22/20 at 1438  Med List Status: <None>   Medication Order Taking? Sig Documenting Provider Last Dose Status Informant  acetaminophen (TYLENOL) 325 MG tablet [id number] Yes Take 650 mg by mouth every 6 (six) hours as needed. [provider] Taking Active   albuterol (VENTOLIN HFA) 108 (90 Base) MCG/ACT inhaler [id number] Yes Inhale 2 puffs into the lungs every 6 (six) hours as needed for wheezing or shortness of breath. McDonough, Si Gaul, PA-C Taking Active   azithromycin (ZITHROMAX) 250 MG tablet [id number] No Take 1 tablet (250 mg total) by mouth daily.  Patient not taking: Reported on 09/22/2020   Mylinda Latina, PA-C Not Taking Active   benzonatate (TESSALON) 100 MG capsule [id number] No Take 1 capsule (100 mg total) by mouth 2 (two) times daily as needed for cough.  Patient not taking: Reported on 09/22/2020   Mylinda Latina, PA-C Not Taking Active   carvedilol (COREG) 6.25 MG tablet [id number] Yes Take 1 tablet (6.25 mg total) by mouth 2 (two) times daily with a meal. Jennye Boroughs, MD Taking Active Self  cetirizine (ZYRTEC) 10 MG tablet [id number] Yes Take 1 tablet (10 mg total) by mouth daily. McDonough, Si Gaul, PA-C Taking Active   clobetasol ointment (TEMOVATE) 0.05 % [id number] Yes Apply 1 application topically 2 (two) times daily as needed (rash).  [provider] Taking Active Self  cloNIDine (CATAPRES) 0.1 MG tablet [id number] Yes Take 1 tablet (0.1 mg total) by mouth 2 (two) times daily. Ronnell Freshwater, NP Taking Active Self  cyclobenzaprine (FLEXERIL) 10 MG tablet [id number] Yes Take 1 tablet (10 mg total) by mouth 3 (three) times daily as needed.  Menshew, Dannielle Karvonen, PA-C Taking Active   fluticasone (FLONASE) 50 MCG/ACT nasal spray [id number] Yes Place 2 sprays into both nostrils daily. Luiz Ochoa, NP Taking Active Self  fluticasone (FLOVENT HFA) 110 MCG/ACT inhaler [id number] Yes Inhale 1 puff into the lungs 2 (two) times daily. McDonough, Si Gaul, PA-C Taking Active  furosemide (LASIX) 20 MG tablet [id number] Yes Take 2 tablets (40 mg total) by mouth daily. Jennye Boroughs, MD Taking Active Self  gabapentin (NEURONTIN) 100 MG capsule [id number] Yes Take 2 capsules (200 mg total) by mouth at bedtime. McDonough, Si Gaul, PA-C Taking Active   hydrALAZINE (APRESOLINE) 50 MG tablet [id number] Yes Take 2 tablets (100 mg total) by mouth 3 (three) times daily. McDonough, Si Gaul, PA-C Taking Active   latanoprost (XALATAN) 0.005 % ophthalmic solution [id number] Yes Place 1 drop into both eyes at bedtime.  [provider] Taking Active Self  levothyroxine (SYNTHROID, LEVOTHROID) 88 MCG tablet [id number] Yes Take 88 mcg by mouth daily before breakfast.  [provider] Taking Active Self  losartan (COZAAR) 100 MG tablet [id number] No Take 1 tablet (100 mg total) by mouth daily.  Patient not taking: Reported on 09/22/2020   Luiz Ochoa, NP Not Taking Active Self  montelukast (SINGULAIR) 10 MG tablet [id number] Yes Take 1 tablet (10 mg total) by mouth daily. Mylinda Latina, PA-C Taking Active   Multiple Vitamins-Minerals (MULTIVITAMIN GUMMIES WOMENS) Sarina Ser [id number] Yes Chew 2 tablets by mouth daily. [provider] Taking Active Self  ondansetron (ZOFRAN ODT) 4 MG disintegrating tablet [id number] Yes Take 1 tablet (4 mg total) by mouth every 8 (eight) hours as needed. Menshew, Dannielle Karvonen, PA-C Taking Active   pantoprazole (PROTONIX) 40 MG tablet [id number] Yes TAKE 1 TABLET BY MOUTH AS NEEDED  Patient taking differently: Take 40 mg by mouth daily.   Ronnell Freshwater, NP Taking Active   potassium chloride SA  (KLOR-CON) 20 MEQ tablet [id number]  Take 1 tablet (20 mEq total) by mouth daily for 2 days. Darylene Price A, FNP  Expired 09/07/20 2359   pravastatin (PRAVACHOL) 20 MG tablet [id number] Yes TAKE 1 TABLET(20 MG) BY MOUTH EVERY NIGHT  Patient taking differently: Take 40 mg by mouth at bedtime.   Ronnell Freshwater, NP Taking Active   predniSONE (DELTASONE) 10 MG tablet [id number] No Take 1 tab po 3 x day for 3 days then take 1 tab po 2 x a day for 3 days and then take 1 tab po daily for 3 days  Patient not taking: Reported on 09/22/2020   Mylinda Latina, PA-C Not Taking Active   sacubitril-valsartan (ENTRESTO) 24-26 MG [id number] Yes Take 1 tablet by mouth 2 (two) times daily. She has been taking for approx 3 weeks, [provider] Taking Active            Med Note Sherilyn Banker   Fri Aug 20, 2020 11:06 AM) Has not yet picked up from pharmacy; plans to pick up after seeing PCP on Monday (08/23/2020)  timolol (TIMOPTIC) 0.25 % ophthalmic solution [id number] Yes Place 1 drop into both eyes 2 (two) times daily.  [provider] Taking Active Self  Med List Note Dessie Coma, CPhT 01/13/18 0117): Patient's last name (as of 2 weeks ago) is now "Mee Hives," although legal documents have yet to be changed-            Patient Active Problem List   Diagnosis Date Noted   AKI (acute kidney injury) ([place]) 07/19/2020   UTI (urinary tract infection) 07/18/2020   H/O urinary retention 07/18/2020   Hyponatremia 07/18/2020   Acute on chronic combined systolic and diastolic CHF (congestive heart failure) ([place]) 07/12/2020   Hypertensive emergency 07/12/2020   Elevated troponin 07/12/2020   Hypothyroidism 07/12/2020   Neuralgia and neuritis 03/14/2020   Primary  generalized (osteo)arthritis 11/30/2019   Encounter for screening mammogram for malignant neoplasm of breast 11/30/2019   Vasomotor rhinitis 06/28/2019   Atopic dermatitis 06/28/2019   Localized superficial swelling, mass, or  lump 06/28/2019   Plantar neuroma of left foot 06/28/2019   Chronic obstructive pulmonary disease ([place]) 11/03/2018   Right flank pain 11/03/2018   Dysuria 11/03/2018   Acute upper respiratory infection 05/06/2018   Acquired hypothyroidism 03/18/2018   CKD (chronic kidney disease), stage IIIa 03/18/2018   ARF (acute renal failure) ([place]) 01/14/2018   Hypertensive crisis 01/14/2018   Acute respiratory failure with hypoxia ([place]) 01/13/2018   Encounter for general adult medical examination with abnormal findings 10/26/2017   Allergic rhinitis 10/26/2017   Vitamin D deficiency 10/26/2017   Gastroesophageal reflux disease without esophagitis 10/26/2017   Essential hypertension 04/16/2017   Thyroid disease 04/16/2017   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 09/15/2016   Dilated cardiomyopathy ([place]) 06/27/2016   Bilateral carotid artery stenosis 05/30/2016   SOBOE (shortness of breath on exertion) 05/30/2016   Hyperparathyroidism, primary ([place]) 08/25/2015   Hypercalcemia 08/12/2014   LVH (left ventricular hypertrophy) due to hypertensive disease, without heart failure 07/09/2014   Moderate mitral insufficiency 07/09/2014   Benign essential hypertension 07/06/2014   Osteoporosis, 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   Mixed hyperlipidemia 01/08/2014   Chronic pelvic pain in female 05/31/2011   Fibroids 05/31/2011   Sarcoidosis 05/31/2011    Immunization History  Administered Date(s) Administered   Influenza, High Dose Seasonal PF 01/14/2018   Influenza-Unspecified 05/22/2017   PFIZER(Purple Top)SARS-COV-2 Vaccination 05/10/2019, 06/04/2019, 12/24/2019   Pneumococcal Conjugate-13 05/22/2017   Pneumococcal Polysaccharide-23 01/14/2018   Pneumococcal-Unspecified 05/22/2017    Conditions to be addressed/monitored:  HTN w/ CKD Stage 3a, CHF, COPD, Allergic Rhinitis, GERD, Hypothyroidism, HLD  Care Plan : General Pharmacy (Adult)  Updates made by Edythe Clarity, RPH since 09/22/2020  12:00 AM     Problem: HTN w/ CKD Stage 3a, CHF, COPD, Allergic Rhinitis, GERD, Hypothyroidism, HLD   Priority: High  Onset Date: 09/22/2020     Goal: Patient-Specific Goal   Note:   Current Barriers:  Unable to achieve control of fluid status, SOB   Pharmacist Clinical Goal(s):  Patient will achieve adherence to monitoring guidelines and medication adherence to achieve therapeutic efficacy achieve control of SOB as evidenced by symptom level adhere to plan to optimize therapeutic regimen for HF as evidenced by report of adherence to recommended medication management changes contact provider office for questions/concerns as evidenced notation of same in electronic health record through collaboration with PharmD and provider.   Interventions: 1:1 collaboration with [name], MD regarding development and update of comprehensive plan of care as evidenced by provider attestation and co-signature Inter-disciplinary care team collaboration (see longitudinal plan of care) Comprehensive medication review performed; medication list updated in electronic medical record  Hypertension (BP goal <140/90) -Controlled -Current treatment: Carvedilol 6.73m BID with a meal Hydralazine 557mtwo tablets tid Clonidine 0.33m58mID Entresto 24-83m59mily -Medications previously tried: Losartan (d/c when started EntrNaples ManorCurrent home readings: 130s/80s, not monitored as much at home due to all of her MD appointments -Current dietary habits: watching her salt intake -Current exercise habits: some walking outside, not much lately due to heat -Denies hypotensive/hypertensive symptoms -Educated on BP goals and benefits of medications for prevention of heart attack, stroke and kidney damage; Daily salt intake goal < 2300 mg; Importance of home blood pressure monitoring; Symptoms of hypotension and importance of maintaining adequate hydration; -Counseled to monitor BP at  home a few times weekly,  document, and provide log at future appointments -Recommended to continue current medication  Hyperlipidemia: (LDL goal < 100) -Controlled -Current treatment: Pravastatin 57m daily -Medications previously tried: none noted -Most recent LDL is controlled, reports 100% adherence with medication -Educated on Cholesterol goals;  Benefits of statin for ASCVD risk reduction; Importance of limiting foods high in cholesterol; -Recommended to continue current medication  Heart Failure (Goal: manage symptoms and prevent exacerbations) -Controlled -Last ejection fraction: 25%  -HF type: Combined Systolic and Diastolic  -Current treatment: Furosemide 235mtwo tablets po daily (AM) Entresto 26-2669maily - gets for free through PAP program Carvedilol 6.60m61md -Medications previously tried: Losartan 100mg91mly  -Current home BP/HR readings: 130s/80s  -Educated on Benefits of medications for managing symptoms and prolonging life Importance of weighing daily; if you gain more than 3 pounds in one day or 5 pounds in one week, contact providers Proper diuretic administration and potassium supplementation She reports she is feeling much better since starting Entresto, no issues with cost at the moment. -Recommended to continue current medication Continue to monitor weight.  COPD (Goal: control symptoms and prevent exacerbations) -Not ideally controlled -Current treatment  None at this time Flovent Hfa 110mcg79mas not picked up yet $$ Albuterol HFA 90 mcg prn - has not picked up yet $$ -Medications previously tried: none noted   -Pulmonary function testing: Pulmonary Functions Testing Results:  No results found for: FEV1, FVC, FEV1FVC, TLC, DLCO  -Exacerbations requiring treatment in last 6 months: none -Patient denies consistent use of maintenance inhaler -Frequency of rescue inhaler use: does not have at home -Counseled on Proper inhaler technique; Benefits of consistent  maintenance inhaler use When to use rescue inhaler Differences between maintenance and rescue inhalers -Recommended she try and use GoodRx coupon for rescue inhaler, will look into patient assistance for Flovent as she would qualify based on her reported income today.   Hypothyroidism (Goal: maintain TSH) -Controlled -Current treatment  Levothyroxine 88mcg 85my -Medications previously tried: none noted -Takes appropriately, TSH currently WNL  -Recommended to continue current medication  GERD (Goal: Control symptoms) -Controlled -Current treatment  Pantoprazole 40mg da58m-Medications previously tried: none noted -Denies current symptoms, takes appropriately  -Recommended to continue current medication  Patient Goals/Self-Care Activities Patient will:  - take medications as prescribed weigh daily, and contact provider if weight gain of 3 lbs in one day or 5 lbs in one week target a minimum of 150 minutes of moderate intensity exercise weekly  Follow Up Plan: The care management team will reach out to the patient again over the next 90 days.         Medication Assistance:  Entresto obtained through manufacturer medication assistance program.  Enrollment ends 03/19/21  Compliance/Adherence/Medication fill history: Care Gaps: Zoster  Star-Rating Drugs: Gabapentin 100 mg 30 DS 09/19/20 Losartan 100 mg 90 DS 07/27/20 Pravastatin 20 mg 90 DS 08/11/20  Patient's preferred pharmacy is:  [id number] [phone] box? Yes Pt endorses 100% compliance  We discussed: Benefits of medication synchronization,  packaging and delivery as well as enhanced pharmacist oversight with [employer]. Patient decided to: Continue  current medication management strategy  Care Plan and Follow Up Patient Decision:  Patient agrees to Care Plan and Follow-up.  Plan: The care management team will reach out to the patient again over the next 90 days.  Beverly Milch, PharmD Clinical Pharmacist [facility] [phone]

## 2020-09-21 NOTE — Progress Notes (Addendum)
Chronic Care Management Pharmacy Assistant   Name: Stacey [name]  MRN: [id number] DOB: 25-May-1945  Stacey [name] is an 75 y.o. year old female who presents for his initial CCM visit with the clinical pharmacist.  Reason for Encounter: Chart Prep   Conditions to be addressed/monitored: HTN, GERD, CKD, HLD, Vitamin D deficiency   Primary concerns for visit include: HTN  Recent office visits:  08/26/20 Stacey Latina, PA-C. For COVID-19. STARTED Benzonatate 100 mg 2 times daily PRN.  08/23/20 [name], Stacey Gaul, PA-C. For follow-up. No medication changes.  06/14/20 [name], Stacey Gaul, PA-C. For follow-up. Per note: BP still elevated at home and in office, will have pt take 2 tabs of hydralazine TID and continue other medications as before. 05/17/20 [name], Stacey Gaul, PA-C. For follow-up. No medication changes.   Recent consult visits:  08/20/20 Cardiology Stacey Graff, FNP. Per note: When you start entresto, you will take it as 1 tablet twice a day and you won't take losartan anymore 08/19/20 Cardiology Stacey Dibble, MD. No medication changes.  07/29/20 Cardiology Stacey Graff, FNP. (Telephone) STARTED Potassium Chloride Crys ER 20 MEQ.  07/29/20 Cardiology Stacey Graff, FNP. For chronic systolic heart failure. No medication changes. 07/26/20 Urology Stacey Co, MD. For urinary retention. STOPPED Albuterol, Fluticasone and cyclosporine.  07/05/20 Ophthalmology [name], Stacey Bookman, MD. No medication changes. 04/29/20 Ophthalmology Darrin [name], Stacey Juba. MD. No medication changes.  04/06/20 Endocrinology [name], Stacey Holiday. MD. No information available. 03/30/20 Endocrinology [name], Stacey Holiday. MD. No information available.  Hospital visits:  07/18/20 [facility] (4 days)Kc, Maren Beach, MD. For UTI. STOPPED Hydrocodone-Acetaminophen. 07/12/20 [facility] (3 days) Stacey Boroughs, MD.STOPPED Colchicine, Ketorolac, Naproxen. 06/25/20 [facility] Emergency Department ( 2 Hours) Stacey Plummer, MD. For back pain. STARTED Cyclobenzaprine 10 mg 3 times daily PRN, Hydrocodone-Acetaminophen 5-325 MG 1 tablet 3 times daily PRN, Ketorolac Tromethamine 10 mg every 8 hours and Ondansetron 4 mg every 8 hours PRN.   Medication History: Gabapentin 100 mg 30 DS 09/19/20 Losartan 100 mg 90 DS 07/27/20 Pravastatin 20 mg 90 DS 08/11/20  Medications: Outpatient Encounter Medications as of 09/21/2020  Medication Sig Note   acetaminophen (TYLENOL) 325 MG tablet Take 650 mg by mouth every 6 (six) hours as needed.    albuterol (VENTOLIN HFA) 108 (90 Base) MCG/ACT inhaler Inhale 2 puffs into the lungs every 6 (six) hours as needed for wheezing or shortness of breath.    azithromycin (ZITHROMAX) 250 MG tablet Take 1 tablet (250 mg total) by mouth daily. (Patient not taking: Reported on 09/07/2020)    benzonatate (TESSALON) 100 MG capsule Take 1 capsule (100 mg total) by mouth 2 (two) times daily as needed for cough. (Patient not taking: Reported on 09/07/2020)    carvedilol (COREG) 6.25 MG tablet Take 1 tablet (6.25 mg total) by mouth 2 (two) times daily with a meal.    cetirizine (ZYRTEC) 10 MG tablet Take 1 tablet (10 mg total) by mouth daily.    clobetasol ointment (TEMOVATE) 3.01 % Apply 1 application topically 2 (two) times daily as needed (rash).     cloNIDine (CATAPRES) 0.1 MG tablet Take 1 tablet (0.1 mg total) by mouth 2 (two) times daily.    cyclobenzaprine (FLEXERIL) 10 MG tablet Take 1 tablet (10 mg total) by mouth 3 (three) times daily as needed.    fluticasone (FLONASE) 50 MCG/ACT nasal spray Place 2 sprays into both nostrils daily.  fluticasone (FLOVENT HFA) 110 MCG/ACT inhaler Inhale 1 puff into the lungs 2 (two) times daily.    furosemide (LASIX) 20 MG tablet Take 2 tablets (40 mg total) by mouth daily.    gabapentin (NEURONTIN) 100 MG capsule Take 2 capsules (200 mg total) by mouth at bedtime.    hydrALAZINE (APRESOLINE)  50 MG tablet Take 2 tablets (100 mg total) by mouth 3 (three) times daily.    latanoprost (XALATAN) 0.005 % ophthalmic solution Place 1 drop into both eyes at bedtime.     levothyroxine (SYNTHROID, LEVOTHROID) 88 MCG tablet Take 88 mcg by mouth daily before breakfast.     losartan (COZAAR) 100 MG tablet Take 1 tablet (100 mg total) by mouth daily.    montelukast (SINGULAIR) 10 MG tablet Take 1 tablet (10 mg total) by mouth daily.    Multiple Vitamins-Minerals (MULTIVITAMIN GUMMIES WOMENS) CHEW Chew 2 tablets by mouth daily.    ondansetron (ZOFRAN ODT) 4 MG disintegrating tablet Take 1 tablet (4 mg total) by mouth every 8 (eight) hours as needed.    pantoprazole (PROTONIX) 40 MG tablet TAKE 1 TABLET BY MOUTH AS NEEDED (Patient taking differently: Take 40 mg by mouth daily.)    potassium chloride SA (KLOR-CON) 20 MEQ tablet Take 1 tablet (20 mEq total) by mouth daily for 2 days.    pravastatin (PRAVACHOL) 20 MG tablet TAKE 1 TABLET(20 MG) BY MOUTH EVERY NIGHT (Patient taking differently: Take 40 mg by mouth at bedtime.)    predniSONE (DELTASONE) 10 MG tablet Take 1 tab po 3 x day for 3 days then take 1 tab po 2 x a day for 3 days and then take 1 tab po daily for 3 days    sacubitril-valsartan (ENTRESTO) 24-26 MG Take 1 tablet by mouth 2 (two) times daily. She has been taking for approx 3 weeks, 08/20/2020: Has not yet picked up from pharmacy; plans to pick up after seeing PCP on Monday (08/23/2020)   timolol (TIMOPTIC) 0.25 % ophthalmic solution Place 1 drop into both eyes 2 (two) times daily.     No facility-administered encounter medications on file as of 09/21/2020.   Have you seen any other providers since your last visit? Patient stated no.  Any changes in your medications or health? Patient stated no.  Any side effects from any medications? Patient stated no.  Do you have an symptoms or problems not managed by your medications? Patient stated no.  Any concerns about your health right now?  Patient stated no.  Has your provider asked that you check blood pressure, blood sugar, or follow special diet at home? Patient stated she monitors her blood pressure.  Do you get any type of exercise on a regular basis? Patient stated she walks for 15-20 min daily.  Can you think of a goal you would like to reach for your health? Patient stated she would like to stay away from the hospital.   Do you have any problems getting your medications? Patient stated no.  Is there anything that you would like to discuss during the appointment?   Please bring medications and supplements to appointment, patient reminded of her face to face appointment on 09/22/20 at 130 pm.  Follow-Up:Pharmacist Review  Charlann Lange, [place] Pharmacist Assistant [phone]

## 2020-09-22 ENCOUNTER — Other Ambulatory Visit: Payer: Self-pay

## 2020-09-22 ENCOUNTER — Ambulatory Visit: Payer: Medicare Other | Admitting: Pharmacist

## 2020-09-22 VITALS — Wt 143.0 lb

## 2020-09-22 DIAGNOSIS — N183 Chronic kidney disease, stage 3 unspecified: Secondary | ICD-10-CM

## 2020-09-22 DIAGNOSIS — J449 Chronic obstructive pulmonary disease, unspecified: Secondary | ICD-10-CM

## 2020-09-22 DIAGNOSIS — I5043 Acute on chronic combined systolic (congestive) and diastolic (congestive) heart failure: Secondary | ICD-10-CM

## 2020-09-22 DIAGNOSIS — E039 Hypothyroidism, unspecified: Secondary | ICD-10-CM

## 2020-09-22 DIAGNOSIS — I10 Essential (primary) hypertension: Secondary | ICD-10-CM

## 2020-09-22 NOTE — Patient Instructions (Signed)
Visit Information   Goals Addressed             This Visit's Progress    Track and Manage Fluids and Swelling-Heart Failure       Timeframe:  Long-Range Goal Priority:  High Start Date:  09/22/20                           Expected End Date:  03/25/20                     Follow Up Date 01/16/21    - call office if I gain more than 2 pounds in one day or 5 pounds in one week - keep legs up while sitting - track weight in diary - use salt in moderation - watch for swelling in feet, ankles and legs every day - weigh myself daily    Why is this important?   It is important to check your weight daily and watch how much salt and liquids you have.  It will help you to manage your heart failure.    Notes:         Patient Care Plan: General Pharmacy (Adult)     Problem Identified: HTN w/ CKD Stage 3a, CHF, COPD, Allergic Rhinitis, GERD, Hypothyroidism, HLD   Priority: High  Onset Date: 09/22/2020     Goal: Patient-Specific Goal   Note:   Current Barriers:  Unable to achieve control of fluid status, SOB   Pharmacist Clinical Goal(s):  Patient will achieve adherence to monitoring guidelines and medication adherence to achieve therapeutic efficacy achieve control of SOB as evidenced by symptom level adhere to plan to optimize therapeutic regimen for HF as evidenced by report of adherence to recommended medication management changes contact provider office for questions/concerns as evidenced notation of same in electronic health record through collaboration with PharmD and provider.   Interventions: 1:1 collaboration with [name], MD regarding development and update of comprehensive plan of care as evidenced by provider attestation and co-signature Inter-disciplinary care team collaboration (see longitudinal plan of care) Comprehensive medication review performed; medication list updated in electronic medical record  Hypertension (BP goal  <140/90) -Controlled -Current treatment: Carvedilol 6.25mg  BID with a meal Hydralazine 50mg  two tablets tid Clonidine 0.1mg  BID Entresto 24-26mg  daily -Medications previously tried: Losartan (d/c when started [place])  -Current home readings: 130s/80s, not monitored as much at home due to all of her MD appointments -Current dietary habits: watching her salt intake -Current exercise habits: some walking outside, not much lately due to heat -Denies hypotensive/hypertensive symptoms -Educated on BP goals and benefits of medications for prevention of heart attack, stroke and kidney damage; Daily salt intake goal < 2300 mg; Importance of home blood pressure monitoring; Symptoms of hypotension and importance of maintaining adequate hydration; -Counseled to monitor BP at home a few times weekly, document, and provide log at future appointments -Recommended to continue current medication  Hyperlipidemia: (LDL goal < 100) -Controlled -Current treatment: Pravastatin 20mg  daily -Medications previously tried: none noted -Most recent LDL is controlled, reports 100% adherence with medication -Educated on Cholesterol goals;  Benefits of statin for ASCVD risk reduction; Importance of limiting foods high in cholesterol; -Recommended to continue current medication  Heart Failure (Goal: manage symptoms and prevent exacerbations) -Controlled -Last ejection fraction: 25%  -HF type: Combined Systolic and Diastolic  -Current treatment: Furosemide 20mg  two tablets po daily (AM) Entresto 26-26mg  daily - gets for free through PAP  program Carvedilol 6.25mg  bid -Medications previously tried: Losartan 100mg  daily  -Current home BP/HR readings: 130s/80s  -Educated on Benefits of medications for managing symptoms and prolonging life Importance of weighing daily; if you gain more than 3 pounds in one day or 5 pounds in one week, contact providers Proper diuretic administration and potassium  supplementation She reports she is feeling much better since starting Entresto, no issues with cost at the moment. -Recommended to continue current medication Continue to monitor weight.  COPD (Goal: control symptoms and prevent exacerbations) -Not ideally controlled -Current treatment  None at this time Flovent Hfa 119mcg - has not picked up yet $$ Albuterol HFA 90 mcg prn - has not picked up yet $$ -Medications previously tried: none noted   -Pulmonary function testing: Pulmonary Functions Testing Results:  No results found for: FEV1, FVC, FEV1FVC, TLC, DLCO  -Exacerbations requiring treatment in last 6 months: none -Patient denies consistent use of maintenance inhaler -Frequency of rescue inhaler use: does not have at home -Counseled on Proper inhaler technique; Benefits of consistent maintenance inhaler use When to use rescue inhaler Differences between maintenance and rescue inhalers -Recommended she try and use GoodRx coupon for rescue inhaler, will look into patient assistance for Flovent as she would qualify based on her reported income today.   Hypothyroidism (Goal: maintain TSH) -Controlled -Current treatment  Levothyroxine 55mcg daily -Medications previously tried: none noted -Takes appropriately, TSH currently WNL  -Recommended to continue current medication  GERD (Goal: Control symptoms) -Controlled -Current treatment  Pantoprazole 40mg  daily -Medications previously tried: none noted -Denies current symptoms, takes appropriately  -Recommended to continue current medication  Patient Goals/Self-Care Activities Patient will:  - take medications as prescribed weigh daily, and contact provider if weight gain of 3 lbs in one day or 5 lbs in one week target a minimum of 150 minutes of moderate intensity exercise weekly  Follow Up Plan: The care management team will reach out to the patient again over the next 90 days.        [name] was given information  about Chronic Care Management services today including:  CCM service includes personalized support from designated clinical staff supervised by her physician, including individualized plan of care and coordination with other care providers 24/7 contact phone numbers for assistance for urgent and routine care needs. Standard insurance, coinsurance, copays and deductibles apply for chronic care management only during months in which we provide at least 20 minutes of these services. Most insurances cover these services at 100%, however patients may be responsible for any copay, coinsurance and/or deductible if applicable. This service may help you avoid the need for more expensive face-to-face services. Only one practitioner may furnish and bill the service in a calendar month. The patient may stop CCM services at any time (effective at the end of the month) by phone call to the office staff.  Patient agreed to services and verbal consent obtained.   The patient verbalized understanding of instructions, educational materials, and care plan provided today and agreed to receive a mailed copy of patient instructions, educational materials, and care plan.  Telephone follow up appointment with pharmacy team member scheduled for: 3 months  Edythe Clarity, [place]

## 2020-09-25 ENCOUNTER — Other Ambulatory Visit: Payer: Self-pay | Admitting: Nurse Practitioner

## 2020-09-25 DIAGNOSIS — I10 Essential (primary) hypertension: Secondary | ICD-10-CM

## 2020-09-27 ENCOUNTER — Other Ambulatory Visit: Payer: Self-pay

## 2020-09-27 DIAGNOSIS — J449 Chronic obstructive pulmonary disease, unspecified: Secondary | ICD-10-CM

## 2020-09-27 DIAGNOSIS — I10 Essential (primary) hypertension: Secondary | ICD-10-CM

## 2020-09-27 MED ORDER — FLUTICASONE PROPIONATE HFA 110 MCG/ACT IN AERO: 1.0000 | INHALATION_SPRAY | Freq: Two times a day (BID) | RESPIRATORY_TRACT | 3 refills | Status: AC

## 2020-09-27 MED ORDER — ALBUTEROL SULFATE HFA 108 (90 BASE) MCG/ACT IN AERS: 2.0000 | INHALATION_SPRAY | Freq: Four times a day (QID) | RESPIRATORY_TRACT | 3 refills | Status: DC | PRN

## 2020-09-27 MED ORDER — CLONIDINE HCL 0.1 MG PO TABS: 0.1000 mg | ORAL_TABLET | Freq: Two times a day (BID) | ORAL | 4 refills | Status: DC

## 2020-09-27 NOTE — Progress Notes (Signed)
Patient ID: Stacey [name], female    DOB: April 06, 1945, 75 y.o.   MRN: [id number]  HPI  Stacey [name] is a 75 y/o female with a history of HTN, CKD, thyroid disease, COPD and chronic heart failure.   Echo report from 07/12/20 reviewed and showed an EF of 20-25% along with mild MR/AR/ AS.   Admitted 07/18/20 due to suprapubic pain with foley catheter in place. Diagnosed with UTI and placed on IV antibiotics. Renal ultrasound showed mild right hydronephrosis with left renal cyst. Discharged after 4 days. Admitted 07/12/20 due to acute on chronic HF with severe HTN. Cardiology consult obtained. Given IV lasix, IV NTG infusion and multiple medications for her HTN. Initially required bipap. Elevated troponins thought to be due to demand ischemia. Weaned off bipap/ oxygen. Failed voiding trial so foley inserted. Discharged after 3 days.   She presents today for a follow-up visit with a chief complaint of minimal shortness of breath upon moderate exertion. She describes this as chronic in nature having been present for several years. She has associated fatigue, hand pain and difficulty sleeping along with this. She denies any abdominal distention, palpitations, pedal edema, chest pain, cough, dizziness or weight gain.   She hasn't been weighing daily because she needs new scales. Feels like her clothes are looser fitting. Has tolerated entresto without known side effects.   Past Medical History:  Diagnosis Date   CHF (congestive heart failure) (HCC)    Chronic kidney disease    COPD (chronic obstructive pulmonary disease) ([place])    Hypertension    Sarcoidosis    Thyroid disease    Past Surgical History:  Procedure Laterality Date   CATARACT EXTRACTION     EYE SURGERY Right 07/09/2019   GALLBLADDER SURGERY     KNEE SURGERY     thyroidectomy     Family History  Problem Relation Age of Onset   Hypertension Mother    Diabetes Mother    Anuerysm Mother    Diabetes Father    Hypertension Father     Prostate cancer Neg Hx    Bladder Cancer Neg Hx    Kidney cancer Neg Hx    Social History   Tobacco Use   Smoking status: Never   Smokeless tobacco: Never  Substance Use Topics   Alcohol use: No   Allergies  Allergen Reactions   Codeine Other (See Comments)    Other reaction(s): GI Intolerance Other Reaction: nausea   chest pain Nausea  Other reaction(s): GI Intolerance, Other (See Comments) Nausea GI Intolerance, nausea, chest pain    Propoxyphene Other (See Comments)    Other Reaction: nausea and chest pain   Alendronate     Other reaction(s): Other (See Comments) Chest pain   Ciprofloxacin Rash   Lisinopril     Other reaction(s): Cough   Amlodipine Swelling   Aspirin     Other reaction(s): Other (See Comments) Other reaction(s): Other (See Comments) Stomach hurt   Methotrexate Hives   Tramadol Nausea And Vomiting   Vicodin [Hydrocodone-Acetaminophen] Nausea And Vomiting   Prior to Admission medications   Medication Sig Start Date End Date Taking? Authorizing Provider  acetaminophen (TYLENOL) 325 MG tablet Take 650 mg by mouth every 6 (six) hours as needed.   Yes [provider]  carvedilol (COREG) 6.25 MG tablet Take 1 tablet (6.25 mg total) by mouth 2 (two) times daily with a meal. 07/15/20  Yes Jennye Boroughs, MD  cetirizine (ZYRTEC) 10 MG tablet Take 1 tablet (  10 mg total) by mouth daily. 08/23/20  Yes McDonough, Lauren K, PA-C  clobetasol ointment (TEMOVATE) 1.51 % Apply 1 application topically 2 (two) times daily as needed (rash).  02/06/16  Yes [provider]  cloNIDine (CATAPRES) 0.1 MG tablet Take 1 tablet (0.1 mg total) by mouth 2 (two) times daily. 09/27/20  Yes [name], MD  cyclobenzaprine (FLEXERIL) 10 MG tablet Take 1 tablet (10 mg total) by mouth 3 (three) times daily as needed. 06/25/20  Yes Menshew, Dannielle Karvonen, PA-C  fluticasone (FLONASE) 50 MCG/ACT nasal spray Place 2 sprays into both nostrils daily. 03/23/20  Yes Luiz Ochoa, NP  furosemide (LASIX) 20 MG tablet Take 2 tablets (40 mg total) by mouth daily. 07/15/20  Yes Jennye Boroughs, MD  gabapentin (NEURONTIN) 100 MG capsule Take 2 capsules (200 mg total) by mouth at bedtime. 08/23/20  Yes McDonough, Lauren K, PA-C  hydrALAZINE (APRESOLINE) 50 MG tablet Take 2 tablets (100 mg total) by mouth 3 (three) times daily. 08/23/20  Yes McDonough, Lauren K, PA-C  latanoprost (XALATAN) 0.005 % ophthalmic solution Place 1 drop into both eyes at bedtime.  01/16/14  Yes [provider]  levothyroxine (SYNTHROID, LEVOTHROID) 88 MCG tablet Take 88 mcg by mouth daily before breakfast.  03/11/17  Yes [provider]  montelukast (SINGULAIR) 10 MG tablet Take 1 tablet (10 mg total) by mouth daily. 08/23/20  Yes McDonough, Si Gaul, PA-C  Multiple Vitamins-Minerals (MULTIVITAMIN GUMMIES WOMENS) CHEW Chew 2 tablets by mouth daily.   Yes [provider]  ondansetron (ZOFRAN ODT) 4 MG disintegrating tablet Take 1 tablet (4 mg total) by mouth every 8 (eight) hours as needed. 06/25/20  Yes Menshew, Dannielle Karvonen, PA-C  pantoprazole (PROTONIX) 40 MG tablet TAKE 1 TABLET BY MOUTH AS NEEDED Patient taking differently: Take 40 mg by mouth daily. 04/18/19  Yes Boscia, Heather E, NP  pravastatin (PRAVACHOL) 20 MG tablet TAKE 1 TABLET(20 MG) BY MOUTH EVERY NIGHT Patient taking differently: Take 20 mg by mouth at bedtime. 08/19/19  Yes Boscia, Heather E, NP  sacubitril-valsartan (ENTRESTO) 24-26 MG Take 1 tablet by mouth 2 (two) times daily.    Yes [provider]  timolol (TIMOPTIC) 0.25 % ophthalmic solution Place 1 drop into both eyes 2 (two) times daily.  07/30/09  Yes [provider]  albuterol (VENTOLIN HFA) 108 (90 Base) MCG/ACT inhaler Inhale 2 puffs into the lungs every 6 (six) hours as needed for wheezing or shortness of breath. Patient not taking: Reported on 09/28/2020 09/27/20   [name], MD  fluticasone [facility] HFA) 110 MCG/ACT inhaler Inhale 1  puff into the lungs 2 (two) times daily. Patient not taking: Reported on 09/28/2020 09/27/20   [name], MD    Review of Systems  Constitutional:  Positive for fatigue (minimal). Negative for appetite change.  HENT:  Negative for congestion, postnasal drip and sore throat.   Eyes: Negative.   Respiratory:  Positive for shortness of breath (minimal). Negative for cough.   Cardiovascular:  Negative for chest pain, palpitations and leg swelling.  Gastrointestinal:  Negative for abdominal distention and abdominal pain.  Endocrine: Negative.   Genitourinary: Negative.   Musculoskeletal:  Positive for arthralgias (hands/ thumb). Negative for back pain.  Skin: Negative.   Allergic/Immunologic: Negative.   Neurological:  Negative for dizziness and light-headedness.  Hematological:  Negative for adenopathy. Does not bruise/bleed easily.  Psychiatric/Behavioral:  Positive for sleep disturbance (sleeping on 2 pillows; not sleeping well). Negative  for dysphoric mood. The patient is not nervous/anxious.    Vitals:   09/28/20 1133  BP: (!) 155/72  Pulse: 77  Resp: 16  SpO2: 99%  Weight: 144 lb (65.3 kg)  Height: 4\' 9"  (1.448 m)   Wt Readings from Last 3 Encounters:  09/28/20 144 lb (65.3 kg)  09/22/20 143 lb (64.9 kg)  09/07/20 142 lb (64.4 kg)   Lab Results  Component Value Date   CREATININE 1.73 (H) 08/05/2020   CREATININE 1.53 (H) 07/29/2020   CREATININE 1.31 (H) 07/22/2020    Physical Exam Vitals and nursing note reviewed. Exam conducted with a chaperone present.  Constitutional:      Appearance: Normal appearance.  HENT:     Head: Normocephalic and atraumatic.     Right Ear: Decreased hearing noted.     Left Ear: Decreased hearing noted.  Cardiovascular:     Rate and Rhythm: Normal rate and regular rhythm.  Pulmonary:     Effort: Pulmonary effort is normal. No respiratory distress.     Breath sounds: No wheezing or rales.  Abdominal:     General: There is no  distension.     Palpations: Abdomen is soft.     Tenderness: There is no abdominal tenderness.  Musculoskeletal:        General: No tenderness.     Cervical back: Normal range of motion and neck supple.     Right lower leg: No edema.     Left lower leg: No edema.  Skin:    General: Skin is warm and dry.  Neurological:     General: No focal deficit present.     Mental Status: She is alert and oriented to person, place, and time.  Psychiatric:        Mood and Affect: Mood normal.        Behavior: Behavior normal.        Thought Content: Thought content normal.   Assessment & Plan:  1: Chronic heart failure with reduced ejection fraction- - NYHA class II - euvolemic today - not weighing daily; set of scales given to her today and she was reminded to call for an overnight weight gain of > 2 pounds or a weekly weight gain of > 5 pounds - weight down 2 pounds from last visit here 5 weeks ago - not adding salt and has been reading food labels for sodium content - discussed how she could eat out and how to make lower sodium choices when she does eat out - saw cardiology [name]) 09/15/20 - on GDMT of carvedilol and entresto - will get BMP today since entresto started at last visit; titrate up if able depending on lab results - consider adding spironolactone/ SGLT2 if lab work allows in the future - receiving entresto through [employer] patient assistance program - participating in paramedicine program - BNP 07/18/20 was 68.3  2: HTN with CKD- - BP mildly elevated today; she says that she's been taking clonidine only at bedtime because it helps her sleep - was on 100mg  losartan with change to entresto; plan to titrate up entresto depending on lab results - had virtual visit with PCP (McDonough) 08/26/20 - BMP 08/05/20 reviewed and showed sodium 140, potassium 3.9, creatinine 1.73 and GFR 31    Patient did not bring her medications nor a list. Each medication was verbally reviewed with the  patient and she was encouraged to bring the bottles to every visit to confirm accuracy of list.  Return in 2  months or sooner for any questions/problems before then.

## 2020-09-28 ENCOUNTER — Other Ambulatory Visit: Payer: Self-pay

## 2020-09-28 ENCOUNTER — Encounter: Payer: Self-pay | Admitting: Family

## 2020-09-28 ENCOUNTER — Other Ambulatory Visit
Admission: RE | Admit: 2020-09-28 | Discharge: 2020-09-28 | Disposition: A | Discharge status: Home / Self Care | Payer: Medicare Other | Source: Ambulatory Visit | Attending: Family | Admitting: Family

## 2020-09-28 ENCOUNTER — Ambulatory Visit: Payer: Medicare Other | Attending: Family | Admitting: Family

## 2020-09-28 VITALS — BP 155/72 | HR 77 | Resp 16 | Ht <= 58 in | Wt 144.0 lb

## 2020-09-28 DIAGNOSIS — Z885 Allergy status to narcotic agent status: Secondary | ICD-10-CM | POA: Diagnosis not present

## 2020-09-28 DIAGNOSIS — R0602 Shortness of breath: Secondary | ICD-10-CM | POA: Diagnosis not present

## 2020-09-28 DIAGNOSIS — Z8249 Family history of ischemic heart disease and other diseases of the circulatory system: Secondary | ICD-10-CM | POA: Diagnosis not present

## 2020-09-28 DIAGNOSIS — Z888 Allergy status to other drugs, medicaments and biological substances status: Secondary | ICD-10-CM | POA: Insufficient documentation

## 2020-09-28 DIAGNOSIS — N189 Chronic kidney disease, unspecified: Secondary | ICD-10-CM | POA: Insufficient documentation

## 2020-09-28 DIAGNOSIS — I130 Hypertensive heart and chronic kidney disease with heart failure and stage 1 through stage 4 chronic kidney disease, or unspecified chronic kidney disease: Secondary | ICD-10-CM | POA: Insufficient documentation

## 2020-09-28 DIAGNOSIS — Z79899 Other long term (current) drug therapy: Secondary | ICD-10-CM | POA: Diagnosis not present

## 2020-09-28 DIAGNOSIS — I5022 Chronic systolic (congestive) heart failure: Secondary | ICD-10-CM | POA: Diagnosis not present

## 2020-09-28 DIAGNOSIS — Z7901 Long term (current) use of anticoagulants: Secondary | ICD-10-CM | POA: Insufficient documentation

## 2020-09-28 DIAGNOSIS — Z886 Allergy status to analgesic agent status: Secondary | ICD-10-CM | POA: Insufficient documentation

## 2020-09-28 DIAGNOSIS — M79643 Pain in unspecified hand: Secondary | ICD-10-CM | POA: Diagnosis not present

## 2020-09-28 DIAGNOSIS — I10 Essential (primary) hypertension: Secondary | ICD-10-CM

## 2020-09-28 LAB — BASIC METABOLIC PANEL
Anion gap: 7 (ref 5–15)
BUN: 17 mg/dL (ref 8–23)
CO2: 27 mmol/L (ref 22–32)
Calcium: 10.9 mg/dL — ABNORMAL HIGH (ref 8.9–10.3)
Chloride: 106 mmol/L (ref 98–111)
Creatinine, Ser: 1.32 mg/dL — ABNORMAL HIGH (ref 0.44–1.00)
GFR, Estimated: 43 mL/min — ABNORMAL LOW (ref >60)
Glucose, Bld: 125 mg/dL — ABNORMAL HIGH (ref 70–99)
Potassium: 3.7 mmol/L (ref 3.5–5.1)
Sodium: 140 mmol/L (ref 135–145)

## 2020-09-28 NOTE — Patient Instructions (Signed)
Continue weighing daily and call for an overnight weight gain of > 2 pounds or a weekly weight gain of >5 pounds. 

## 2020-09-29 ENCOUNTER — Telehealth: Payer: Self-pay | Admitting: Family

## 2020-09-29 DIAGNOSIS — E039 Hypothyroidism, unspecified: Secondary | ICD-10-CM | POA: Diagnosis not present

## 2020-09-29 DIAGNOSIS — E210 Primary hyperparathyroidism: Secondary | ICD-10-CM | POA: Diagnosis not present

## 2020-09-29 DIAGNOSIS — G8929 Other chronic pain: Secondary | ICD-10-CM | POA: Diagnosis not present

## 2020-09-29 DIAGNOSIS — M79645 Pain in left finger(s): Secondary | ICD-10-CM | POA: Diagnosis not present

## 2020-09-29 DIAGNOSIS — M6530 Trigger finger, unspecified finger: Secondary | ICD-10-CM | POA: Diagnosis not present

## 2020-09-29 LAB — BASIC METABOLIC PANEL: Glucose: 142

## 2020-09-29 MED ORDER — SACUBITRIL-VALSARTAN 24-26 MG PO TABS: 1.0000 | ORAL_TABLET | Freq: Two times a day (BID) | ORAL | 3 refills | Status: DC

## 2020-09-29 MED ORDER — SACUBITRIL-VALSARTAN 49-51 MG PO TABS: 1.0000 | ORAL_TABLET | Freq: Two times a day (BID) | ORAL | 3 refills | Status: DC

## 2020-09-29 NOTE — Addendum Note (Signed)
Addended by: Darylene Price A on: 09/29/2020 02:08 PM   Modules accepted: Orders

## 2020-09-29 NOTE — Telephone Encounter (Addendum)
LVM for patient regarding BMP results obtained yesterday. Potassium is normal and renal function has improved (creatinine 1.32/ GFR 43)  Was going to increase entresto to 49/51mg  but patient says that she forgot to mention yesterday that she hadn't taken all of her medications yesterday morning. Today when she went to her PCP's office, the top number of her BP was 112 so she feels hesitant to increase the dosage.   Advised patient to continue on her entresto as 24/26mg  BID for now and she was comfortable with this plan.

## 2020-10-06 DIAGNOSIS — E039 Hypothyroidism, unspecified: Secondary | ICD-10-CM | POA: Diagnosis not present

## 2020-10-06 DIAGNOSIS — M810 Age-related osteoporosis without current pathological fracture: Secondary | ICD-10-CM | POA: Diagnosis not present

## 2020-10-06 DIAGNOSIS — E210 Primary hyperparathyroidism: Secondary | ICD-10-CM | POA: Diagnosis not present

## 2020-10-06 DIAGNOSIS — N1831 Chronic kidney disease, stage 3a: Secondary | ICD-10-CM | POA: Diagnosis not present

## 2020-10-14 ENCOUNTER — Other Ambulatory Visit (HOSPITAL_COMMUNITY): Payer: Self-pay

## 2020-10-14 NOTE — Progress Notes (Signed)
Today spoke to Stacey [name] and she said she is doing great.  They are going to the beach this weekend, her and her husband.  She states they are both well.  She has packed her medications to take with her.  Will set up an appt to visit at home next week.  Appetite is good.  She tries to stay active. She walks daily when nice outside.  She has everything she needs.  She denies any chest pain, headaches, dizziness or increased shortness of breath.  Will visit for heart failure, diet and medication compliance.   [place] [phone]

## 2020-10-17 DIAGNOSIS — I10 Essential (primary) hypertension: Secondary | ICD-10-CM | POA: Diagnosis not present

## 2020-10-17 DIAGNOSIS — E782 Mixed hyperlipidemia: Secondary | ICD-10-CM | POA: Diagnosis not present

## 2020-10-27 ENCOUNTER — Other Ambulatory Visit (HOSPITAL_COMMUNITY): Payer: Self-pay

## 2020-10-28 ENCOUNTER — Encounter (HOSPITAL_COMMUNITY): Payer: Self-pay

## 2020-10-28 ENCOUNTER — Encounter: Payer: Self-pay | Admitting: Internal Medicine

## 2020-10-28 NOTE — Progress Notes (Signed)
Today had a home visit with Stacey [name].  She states doing well.  She has a little cough from a cod but feels fine.  Lungs are clear.  She states since she had covid its been on and off.  She has no edema noted in ankles or legs.  Abdomen is not tight.  She denie any chest pain, headaches, dizziness or increased shortness of breath.  She talked how wonderful it was on her beach trip and they have some other trips coming up.  Mood is good.  She has all her medications and aware of how to take them.  She places them in her med box weekly.  Appetite is good.  She is aware of any up coming appts.  She is walking inside while its so hot, but will resume walking her driveway with weather permitting.  She lives with spouse and has a good relationship.  She has a son that supports her with transportation to doctors appt, etc.  She has everything for daily living.  Will continue to visit for heart failure.   [place] [phone]

## 2020-11-01 DIAGNOSIS — H401133 Primary open-angle glaucoma, bilateral, severe stage: Secondary | ICD-10-CM | POA: Diagnosis not present

## 2020-11-01 DIAGNOSIS — H00014 Hordeolum externum left upper eyelid: Secondary | ICD-10-CM | POA: Diagnosis not present

## 2020-11-08 ENCOUNTER — Telehealth: Payer: Self-pay | Admitting: Pharmacist

## 2020-11-08 NOTE — Progress Notes (Addendum)
Chronic Care Management Pharmacy Assistant   Name: Stacey [name]  MRN: [id number] DOB: 1945-11-29  Reason for Encounter: General Disease State Call   Conditions to be addressed/monitored: HTN w/ CKD Stage 3a, CHF, COPD, Allergic Rhinitis, GERD, Hypothyroidism, HLD  Recent office visits:  None since 09/22/20  Recent consult visits:  10/06/20 Endo Germain Osgood, MD. No medication changes.  09/29/20 (Telephone) Cardiology STARTED Sacubitril-Valsartan 24-26 mg 1 tablet 2 times daily.  09/29/20 Orthopedic Regino Bellow For chronic pain. No information given.  09/28/20 Cardiology Allene Dillon, FNP. For chronic heart failure. STOPPED Azithromycin, Benzonatate, Losartan, Prednisone.   Hospital visits:  None since 09/22/20  Medications: Outpatient Encounter Medications as of 11/08/2020  Medication Sig Note   acetaminophen (TYLENOL) 325 MG tablet Take 650 mg by mouth every 6 (six) hours as needed.    albuterol (VENTOLIN HFA) 108 (90 Base) MCG/ACT inhaler Inhale 2 puffs into the lungs every 6 (six) hours as needed for wheezing or shortness of breath. 09/28/2020: Patient hasn't picked up the inhaler from pharmacy yet   carvedilol (COREG) 6.25 MG tablet Take 1 tablet (6.25 mg total) by mouth 2 (two) times daily with a meal.    cetirizine (ZYRTEC) 10 MG tablet Take 1 tablet (10 mg total) by mouth daily.    clobetasol ointment (TEMOVATE) AB-123456789 % Apply 1 application topically 2 (two) times daily as needed (rash).     cloNIDine (CATAPRES) 0.1 MG tablet Take 1 tablet (0.1 mg total) by mouth 2 (two) times daily.    cyclobenzaprine (FLEXERIL) 10 MG tablet Take 1 tablet (10 mg total) by mouth 3 (three) times daily as needed.    fluticasone (FLONASE) 50 MCG/ACT nasal spray Place 2 sprays into both nostrils daily.    fluticasone (FLOVENT HFA) 110 MCG/ACT inhaler Inhale 1 puff into the lungs 2 (two) times daily. (Patient not taking: No sig reported)    furosemide (LASIX) 20 MG tablet Take 2  tablets (40 mg total) by mouth daily.    gabapentin (NEURONTIN) 100 MG capsule Take 2 capsules (200 mg total) by mouth at bedtime.    hydrALAZINE (APRESOLINE) 50 MG tablet Take 2 tablets (100 mg total) by mouth 3 (three) times daily.    latanoprost (XALATAN) 0.005 % ophthalmic solution Place 1 drop into both eyes at bedtime.     levothyroxine (SYNTHROID, LEVOTHROID) 88 MCG tablet Take 88 mcg by mouth daily before breakfast.     montelukast (SINGULAIR) 10 MG tablet Take 1 tablet (10 mg total) by mouth daily.    Multiple Vitamins-Minerals (MULTIVITAMIN GUMMIES WOMENS) CHEW Chew 2 tablets by mouth daily.    ondansetron (ZOFRAN ODT) 4 MG disintegrating tablet Take 1 tablet (4 mg total) by mouth every 8 (eight) hours as needed.    pantoprazole (PROTONIX) 40 MG tablet TAKE 1 TABLET BY MOUTH AS NEEDED (Patient taking differently: Take 40 mg by mouth daily.)    potassium chloride SA (KLOR-CON) 20 MEQ tablet Take 1 tablet (20 mEq total) by mouth daily for 2 days.    pravastatin (PRAVACHOL) 20 MG tablet TAKE 1 TABLET(20 MG) BY MOUTH EVERY NIGHT (Patient taking differently: Take 20 mg by mouth at bedtime.)    sacubitril-valsartan (ENTRESTO) 24-26 MG Take 1 tablet by mouth 2 (two) times daily.    timolol (TIMOPTIC) 0.25 % ophthalmic solution Place 1 drop into both eyes 2 (two) times daily.     No facility-administered encounter medications on file as of 11/08/2020.   HF Follow-Up: Patient stated  she is weighing herself daily. She stated she has not had any swelling. She stated she has not experienced any shortness of breath. I explained to her that if she gains over 3 lbs in a day or 5 lbs in a week that is too much fluid needs to let somebody know, she voiced understanding. She stated her most recent weight has been around 143, 144.  Star Rating Drugs: Pravastatin 20 mg   Follow-Up:Pharmacist Review    Charlann Lange, RMA Clinical Pharmacist Assistant [phone]  10 minutes spent in review,  coordination, and documentation.  Reviewed by: Beverly Milch, PharmD Clinical Pharmacist [phone]

## 2020-11-09 ENCOUNTER — Other Ambulatory Visit: Payer: Self-pay | Admitting: Nurse Practitioner

## 2020-11-15 ENCOUNTER — Other Ambulatory Visit: Payer: Self-pay

## 2020-11-15 ENCOUNTER — Ambulatory Visit (INDEPENDENT_AMBULATORY_CARE_PROVIDER_SITE_OTHER): Payer: Medicare Other | Admitting: Nurse Practitioner

## 2020-11-15 ENCOUNTER — Encounter: Payer: Self-pay | Admitting: Nurse Practitioner

## 2020-11-15 VITALS — BP 140/80 | HR 75 | Temp 97.4°F | Resp 16 | Ht <= 58 in | Wt 143.2 lb

## 2020-11-15 DIAGNOSIS — Z1231 Encounter for screening mammogram for malignant neoplasm of breast: Secondary | ICD-10-CM | POA: Diagnosis not present

## 2020-11-15 DIAGNOSIS — R300 Dysuria: Secondary | ICD-10-CM

## 2020-11-15 DIAGNOSIS — E039 Hypothyroidism, unspecified: Secondary | ICD-10-CM

## 2020-11-15 DIAGNOSIS — Z0001 Encounter for general adult medical examination with abnormal findings: Secondary | ICD-10-CM

## 2020-11-15 DIAGNOSIS — K869 Disease of pancreas, unspecified: Secondary | ICD-10-CM | POA: Diagnosis not present

## 2020-11-15 DIAGNOSIS — I10 Essential (primary) hypertension: Secondary | ICD-10-CM

## 2020-11-15 DIAGNOSIS — E782 Mixed hyperlipidemia: Secondary | ICD-10-CM | POA: Diagnosis not present

## 2020-11-15 MED ORDER — PRAVASTATIN SODIUM 20 MG PO TABS: 20.0000 mg | ORAL_TABLET | Freq: Every day | ORAL | 1 refills | Status: DC

## 2020-11-15 MED ORDER — CYCLOBENZAPRINE HCL 10 MG PO TABS
10.0000 mg | ORAL_TABLET | Freq: Every day | ORAL | 2 refills | Status: DC | PRN
Start: 2020-11-15 — End: 2021-04-20

## 2020-11-15 NOTE — Progress Notes (Signed)
[facility] [place], [place] [address]  Internal MEDICINE  Office Visit Note  Patient Name: Stacey [name]  [id number]  [id number]  Date of Service: 11/15/2020  Chief Complaint  Patient presents with   Medicare Wellness    refills   COPD   Hypertension    HPI Stacey [name] presents for an annual well visit and physical exam. she has a history of CHF, COPD, hypertension, hypothyroidism s/p thyroidectomy, CKD, sarcoidosis, cholecystectomy. She lives at home with her husband who she married 3 years ago. Her first husband died 76 years ago. She is retired from [employer] as a Occupational psychologist. She has had 3 doses of the COVID vaccine and will get 4th dose in September. She plans to get the shingles vaccine in October. She denies any pain. Bone density scan was done on 09/10/20 and the result was osteopenia. She has been eating a low sodium diet and has been walking for physical activity. Her SBP has ranged 100-120 at home. She is due for her screening mammogram. Colonoscopy is due in January 2023.    Current Medication: Outpatient Encounter Medications as of 11/15/2020  Medication Sig Note   acetaminophen (TYLENOL) 325 MG tablet Take 650 mg by mouth every 6 (six) hours as needed.    albuterol (VENTOLIN HFA) 108 (90 Base) MCG/ACT inhaler Inhale 2 puffs into the lungs every 6 (six) hours as needed for wheezing or shortness of breath. 09/28/2020: Patient hasn't picked up the inhaler from pharmacy yet   carvedilol (COREG) 6.25 MG tablet Take 1 tablet (6.25 mg total) by mouth 2 (two) times daily with a meal.    cetirizine (ZYRTEC) 10 MG tablet Take 1 tablet (10 mg total) by mouth daily.    clobetasol ointment (TEMOVATE) AB-123456789 % Apply 1 application topically 2 (two) times daily as needed (rash).     cloNIDine (CATAPRES) 0.1 MG tablet Take 1 tablet (0.1 mg total) by mouth 2 (two) times daily.    fluticasone (FLONASE) 50 MCG/ACT nasal spray Place 2 sprays into both nostrils daily.     fluticasone (FLOVENT HFA) 110 MCG/ACT inhaler Inhale 1 puff into the lungs 2 (two) times daily.    furosemide (LASIX) 20 MG tablet Take 2 tablets (40 mg total) by mouth daily.    gabapentin (NEURONTIN) 100 MG capsule Take 2 capsules (200 mg total) by mouth at bedtime.    latanoprost (XALATAN) 0.005 % ophthalmic solution Place 1 drop into both eyes at bedtime.     levothyroxine (SYNTHROID, LEVOTHROID) 88 MCG tablet Take 88 mcg by mouth daily before breakfast.     montelukast (SINGULAIR) 10 MG tablet Take 1 tablet (10 mg total) by mouth daily.    Multiple Vitamins-Minerals (MULTIVITAMIN GUMMIES WOMENS) CHEW Chew 2 tablets by mouth daily.    ondansetron (ZOFRAN ODT) 4 MG disintegrating tablet Take 1 tablet (4 mg total) by mouth every 8 (eight) hours as needed.    pantoprazole (PROTONIX) 40 MG tablet TAKE 1 TABLET BY MOUTH AS NEEDED (Patient taking differently: Take 40 mg by mouth daily.)    sacubitril-valsartan (ENTRESTO) 24-26 MG Take 1 tablet by mouth 2 (two) times daily.    timolol (TIMOPTIC) 0.25 % ophthalmic solution Place 1 drop into both eyes 2 (two) times daily.     [DISCONTINUED] cyclobenzaprine (FLEXERIL) 10 MG tablet Take 1 tablet (10 mg total) by mouth 3 (three) times daily as needed.    [DISCONTINUED] hydrALAZINE (APRESOLINE) 50 MG tablet Take 2 tablets (100 mg total) by mouth  3 (three) times daily.    [DISCONTINUED] pravastatin (PRAVACHOL) 20 MG tablet TAKE 1 TABLET(20 MG) BY MOUTH EVERY NIGHT (Patient taking differently: Take 20 mg by mouth at bedtime.)    cyclobenzaprine (FLEXERIL) 10 MG tablet Take 1 tablet (10 mg total) by mouth daily as needed for muscle spasms.    pravastatin (PRAVACHOL) 20 MG tablet Take 1 tablet (20 mg total) by mouth at bedtime.    [DISCONTINUED] potassium chloride SA (KLOR-CON) 20 MEQ tablet Take 1 tablet (20 mEq total) by mouth daily for 2 days.    No facility-administered encounter medications on file as of 11/15/2020.    Surgical History: Past Surgical  History:  Procedure Laterality Date   CATARACT EXTRACTION     EYE SURGERY Right 07/09/2019   GALLBLADDER SURGERY     KNEE SURGERY     thyroidectomy      Medical History: Past Medical History:  Diagnosis Date   CHF (congestive heart failure) (HCC)    Chronic kidney disease    COPD (chronic obstructive pulmonary disease) (HCC)    Hypertension    Sarcoidosis    Thyroid disease     Family History: Family History  Problem Relation Age of Onset   Hypertension Mother    Diabetes Mother    Anuerysm Mother    Diabetes Father    Hypertension Father    Prostate cancer Neg Hx    Bladder Cancer Neg Hx    Kidney cancer Neg Hx     Social History   Socioeconomic History   Marital status: Married    Spouse name: Not on file   Number of children: Not on file   Years of education: Not on file   Highest education level: Not on file  Occupational History   Not on file  Tobacco Use   Smoking status: Never   Smokeless tobacco: Never  Vaping Use   Vaping Use: Never used  Substance and Sexual Activity   Alcohol use: No   Drug use: Not Currently   Sexual activity: Not on file  Other Topics Concern   Not on file  Social History Narrative   Not on file   Social Determinants of Health   Financial Resource Strain: Low Risk    Difficulty of Paying Living Expenses: Not very hard  Food Insecurity: Not on file  Transportation Needs: Not on file  Physical Activity: Not on file  Stress: Not on file  Social Connections: Not on file  Intimate Partner Violence: Not on file      Review of Systems  Constitutional:  Negative for activity change, appetite change, chills, fatigue, fever and unexpected weight change.  HENT: Negative.  Negative for congestion, ear pain, rhinorrhea, sore throat and trouble swallowing.   Eyes: Negative.   Respiratory: Negative.  Negative for cough, chest tightness, shortness of breath and wheezing.   Cardiovascular: Negative.  Negative for chest pain.   Gastrointestinal: Negative.  Negative for abdominal pain, blood in stool, constipation, diarrhea, nausea and vomiting.  Endocrine: Negative.   Genitourinary: Negative.  Negative for difficulty urinating, dysuria, frequency, hematuria and urgency.  Musculoskeletal: Negative.  Negative for arthralgias, back pain, joint swelling, myalgias and neck pain.  Skin: Negative.  Negative for rash and wound.  Allergic/Immunologic: Negative.  Negative for immunocompromised state.  Neurological: Negative.  Negative for dizziness, seizures, numbness and headaches.  Hematological: Negative.   Psychiatric/Behavioral: Negative.  Negative for behavioral problems, self-injury and suicidal ideas. The patient is not nervous/anxious.    Vital  Signs: BP 140/80 Comment: 158/94  Pulse 75   Temp (!) 97.4 F (36.3 C)   Resp 16   Ht '4\' 9"'$  (1.448 m)   Wt 143 lb 3.2 oz (65 kg)   SpO2 99%   BMI 30.99 kg/m    Physical Exam Vitals reviewed.  Constitutional:      General: She is not in acute distress.    Appearance: Normal appearance. She is well-developed and normal weight. She is not ill-appearing or diaphoretic.  HENT:     Head: Normocephalic and atraumatic.     Right Ear: Tympanic membrane, ear canal and external ear normal.     Left Ear: Tympanic membrane, ear canal and external ear normal.     Nose: Nose normal. No congestion or rhinorrhea.     Mouth/Throat:     Mouth: Mucous membranes are moist.     Pharynx: Oropharynx is clear. No oropharyngeal exudate or posterior oropharyngeal erythema.  Eyes:     Extraocular Movements: Extraocular movements intact.     Conjunctiva/sclera: Conjunctivae normal.     Pupils: Pupils are equal, round, and reactive to light.  Neck:     Thyroid: No thyromegaly.     Vascular: No carotid bruit or JVD.     Trachea: No tracheal deviation.  Cardiovascular:     Rate and Rhythm: Normal rate and regular rhythm.     Pulses: Normal pulses.     Heart sounds: Normal heart  sounds. No murmur heard.   No friction rub. No gallop.  Pulmonary:     Effort: Pulmonary effort is normal. No respiratory distress.     Breath sounds: Normal breath sounds. No wheezing or rales.  Chest:     Chest wall: No tenderness.  Abdominal:     General: Bowel sounds are normal. There is no distension.     Palpations: Abdomen is soft. There is no mass.     Tenderness: There is no abdominal tenderness. There is no guarding or rebound.     Hernia: No hernia is present.  Musculoskeletal:        General: Normal range of motion.     Cervical back: Normal range of motion and neck supple.  Lymphadenopathy:     Cervical: No cervical adenopathy.  Skin:    General: Skin is warm and dry.     Capillary Refill: Capillary refill takes less than 2 seconds.  Neurological:     Mental Status: She is alert and oriented to person, place, and time.     Cranial Nerves: No cranial nerve deficit.     Coordination: Coordination normal.     Gait: Gait normal.  Psychiatric:        Mood and Affect: Mood normal.        Behavior: Behavior normal.        Thought Content: Thought content normal.        Judgment: Judgment normal.       Assessment/Plan: 1. Encounter for routine adult health examination with abnormal findings Age-appropriate preventive screenings and vaccinations discussed, annual physical exam completed. Routine labs for health maintenance not needed at this time. She has had a lot of labs drawn over the past few months. PHM updated. Her mammogram is due.  2. Essential hypertension Stable with current medication, continue as prescribed.   3. Acquired hypothyroidism S/p thyroidectomy, taking levothyroxine, her most recent TSH level was wnl.   4. Mixed hyperlipidemia Taking pravastatin, lipid panel was wnl in April 2022.  5. Encounter for  screening mammogram for malignant neoplasm of breast Routine mammogram was ordered and is scheduled for November.  6. Dysuria Routine  urinalysis done.  - UA/M w/rflx Culture, Routine   7. Pancreatic lesion Mr abdomen results are as follows   1. The lesion of concern on prior ultrasound measures 6 by 7 by 8 mm in the left mid kidney, and has high T2 signal. The lesion is essentially not visible on T1 weighted images although this is ascribed to severe motion artifact and tiny size of the lesion. Enhancement characteristics are unknown as contrast was not administered. Statistically this small lesion is most likely to be a small minimally complex cyst based on the ultrasound appearance. The patient is unable to breath hold and accordingly multiple series are severely degraded by motion artifact. 2. Mild cardiomegaly with hazy opacities in the lower lobes probably a combination of atelectasis and potentially some low-grade edema or alveolitis. 3. 9 mm cystic lesion in the tail the pancreas. 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 Given the size of the lesion and the patient's age, follow up pancreatic protocol MRI is recommended in 2 years time. This recommendation follows ACR consensus guidelines: Management of Incidental Pancreatic Cysts: A White Paper of the ACR Incidental Findings Committee. [place] [id number].   General Counseling: laree grigoryan understanding of the findings of todays visit and agrees with plan of treatment. I have discussed any further diagnostic evaluation that may be needed or ordered today. We also reviewed her medications today. she has been encouraged to call the office with any questions or concerns that should arise related to todays visit.    Orders Placed This Encounter  Procedures   Microscopic Examination   UA/M w/rflx Culture, Routine    Meds ordered this encounter  Medications   cyclobenzaprine (FLEXERIL) 10 MG tablet    Sig: Take 1 tablet (10 mg total) by mouth daily as needed for muscle spasms.     Dispense:  30 tablet    Refill:  2   pravastatin (PRAVACHOL) 20 MG tablet    Sig: Take 1 tablet (20 mg total) by mouth at bedtime.    Dispense:  90 tablet    Refill:  1    Return in about 3 months (around 02/15/2021) for F/U, BP check, Cyndia Degraff PCP.   Total time spent:30 Minutes Time spent includes review of chart, medications, test results, and follow up plan with the patient.   [place] Controlled Substance Database was reviewed by me.  This patient was seen by Jonetta Osgood, FNP-C in collaboration with Dr. Clayborn Bigness as a part of collaborative care agreement.  Yacqub Baston R. Valetta Fuller, MSN, FNP-C Internal medicine

## 2020-11-16 LAB — UA/M W/RFLX CULTURE, ROUTINE
Bilirubin, UA: NEGATIVE
Glucose, UA: NEGATIVE
Ketones, UA: NEGATIVE
Leukocytes,UA: NEGATIVE
Nitrite, UA: NEGATIVE
RBC, UA: NEGATIVE
Specific Gravity, UA: 1.013 (ref 1.005–1.030)
Urobilinogen, Ur: 0.2 mg/dL (ref 0.2–1.0)
pH, UA: 6.5 (ref 5.0–7.5)

## 2020-11-16 LAB — MICROSCOPIC EXAMINATION
Bacteria, UA: NONE SEEN
Casts: NONE SEEN /lpf
Epithelial Cells (non renal): NONE SEEN /hpf (ref 0–10)
RBC, Urine: NONE SEEN /hpf (ref 0–2)
WBC, UA: NONE SEEN /hpf (ref 0–5)

## 2020-11-17 ENCOUNTER — Other Ambulatory Visit: Payer: Self-pay | Admitting: Nurse Practitioner

## 2020-11-17 DIAGNOSIS — I10 Essential (primary) hypertension: Secondary | ICD-10-CM | POA: Diagnosis not present

## 2020-11-17 DIAGNOSIS — Z1231 Encounter for screening mammogram for malignant neoplasm of breast: Secondary | ICD-10-CM

## 2020-11-17 DIAGNOSIS — E782 Mixed hyperlipidemia: Secondary | ICD-10-CM | POA: Diagnosis not present

## 2020-11-19 ENCOUNTER — Other Ambulatory Visit: Payer: Self-pay | Admitting: Physician Assistant

## 2020-11-19 DIAGNOSIS — I10 Essential (primary) hypertension: Secondary | ICD-10-CM

## 2020-11-27 ENCOUNTER — Encounter: Payer: Self-pay | Admitting: Nurse Practitioner

## 2020-12-06 DIAGNOSIS — H401130 Primary open-angle glaucoma, bilateral, stage unspecified: Secondary | ICD-10-CM | POA: Diagnosis not present

## 2020-12-06 DIAGNOSIS — H04123 Dry eye syndrome of bilateral lacrimal glands: Secondary | ICD-10-CM | POA: Diagnosis not present

## 2020-12-06 DIAGNOSIS — H35353 Cystoid macular degeneration, bilateral: Secondary | ICD-10-CM | POA: Diagnosis not present

## 2020-12-06 DIAGNOSIS — H52213 Irregular astigmatism, bilateral: Secondary | ICD-10-CM | POA: Diagnosis not present

## 2020-12-06 DIAGNOSIS — H35373 Puckering of macula, bilateral: Secondary | ICD-10-CM | POA: Diagnosis not present

## 2020-12-06 DIAGNOSIS — H33313 Horseshoe tear of retina without detachment, bilateral: Secondary | ICD-10-CM | POA: Diagnosis not present

## 2020-12-06 DIAGNOSIS — H524 Presbyopia: Secondary | ICD-10-CM | POA: Diagnosis not present

## 2020-12-07 ENCOUNTER — Encounter (HOSPITAL_COMMUNITY): Payer: Self-pay

## 2020-12-07 ENCOUNTER — Other Ambulatory Visit (HOSPITAL_COMMUNITY): Payer: Self-pay

## 2020-12-07 NOTE — Progress Notes (Signed)
Today had home visit with [name].  She is doing well, she has been traveling and staying busy.  Mood is good.  She has all her medications and aware of how to take them.  She watches high sodium foods most days and watches how much fluid she intakes.  She is aware to intake 64 ozs.  She did have a 2 lbs weight gain but has went down today, she did not take her fluid pill yesterday due to eye appt and gone most of the day.  Discussed importance of taking her prescribed meds daily.  She has no edema in lower extremities. She denies having a full feeling in abdomen.  She denies cough, dizziness or increased shortness of breath.  She is planning a beach trip in October, she is excited about.  She walks dialy when weather allows.  She stays busy around the home.  She lives with husband and has support for transportation to doctors by her son.  She denies any chest pain or headaches.  She has everything for daily living.  Will continue to visit for heart failure, diet and medication management.   [place] [phone]

## 2020-12-09 ENCOUNTER — Other Ambulatory Visit
Admission: RE | Admit: 2020-12-09 | Discharge: 2020-12-09 | Disposition: A | Discharge status: Home / Self Care | Payer: Medicare Other | Source: Ambulatory Visit | Attending: Family | Admitting: Family

## 2020-12-09 ENCOUNTER — Encounter: Payer: Self-pay | Admitting: Family

## 2020-12-09 ENCOUNTER — Ambulatory Visit (HOSPITAL_BASED_OUTPATIENT_CLINIC_OR_DEPARTMENT_OTHER): Payer: Medicare Other | Admitting: Family

## 2020-12-09 ENCOUNTER — Other Ambulatory Visit: Payer: Self-pay

## 2020-12-09 VITALS — BP 130/51 | HR 64 | Resp 18 | Ht <= 58 in | Wt 143.5 lb

## 2020-12-09 DIAGNOSIS — I5022 Chronic systolic (congestive) heart failure: Secondary | ICD-10-CM

## 2020-12-09 DIAGNOSIS — I10 Essential (primary) hypertension: Secondary | ICD-10-CM

## 2020-12-09 LAB — BASIC METABOLIC PANEL
Anion gap: 8 (ref 5–15)
BUN: 24 mg/dL — ABNORMAL HIGH (ref 8–23)
CO2: 27 mmol/L (ref 22–32)
Calcium: 10.6 mg/dL — ABNORMAL HIGH (ref 8.9–10.3)
Chloride: 103 mmol/L (ref 98–111)
Creatinine, Ser: 1.31 mg/dL — ABNORMAL HIGH (ref 0.44–1.00)
GFR, Estimated: 43 mL/min — ABNORMAL LOW (ref >60)
Glucose, Bld: 126 mg/dL — ABNORMAL HIGH (ref 70–99)
Potassium: 4.3 mmol/L (ref 3.5–5.1)
Sodium: 138 mmol/L (ref 135–145)

## 2020-12-09 NOTE — Progress Notes (Signed)
Patient ID: Stacey [name], female    DOB: 1945/05/29, 75 y.o.   MRN: [id number]  HPI  [name] is a 75 y/o female with a history of HTN, CKD, thyroid disease, COPD and chronic heart failure.   Echo report from 07/12/20 reviewed and showed an EF of 20-25% along with mild MR/AR/ AS.   Admitted 07/18/20 due to suprapubic pain with foley catheter in place. Diagnosed with UTI and placed on IV antibiotics. Renal ultrasound showed mild right hydronephrosis with left renal cyst. Discharged after 4 days. Admitted 07/12/20 due to acute on chronic HF with severe HTN. Cardiology consult obtained. Given IV lasix, IV NTG infusion and multiple medications for her HTN. Initially required bipap. Elevated troponins thought to be due to demand ischemia. Weaned off bipap/ oxygen. Failed voiding trial so foley inserted. Discharged after 3 days.   She presents today for a follow-up visit with a chief complaint of minimal fatigue upon moderate exertion. She describes this as chronic in nature having been present for several years. She has associated difficulty sleeping along with this. She denies any dizziness, cough, shortness of breath, chest pain, pedal edema, palpitations, abdominal distention or weight gain.   Past Medical History:  Diagnosis Date   CHF (congestive heart failure) (HCC)    Chronic kidney disease    COPD (chronic obstructive pulmonary disease) ([place])    Hypertension    Sarcoidosis    Thyroid disease    Past Surgical History:  Procedure Laterality Date   CATARACT EXTRACTION     EYE SURGERY Right 07/09/2019   GALLBLADDER SURGERY     KNEE SURGERY     thyroidectomy     Family History  Problem Relation Age of Onset   Hypertension Mother    Diabetes Mother    Anuerysm Mother    Diabetes Father    Hypertension Father    Prostate cancer Neg Hx    Bladder Cancer Neg Hx    Kidney cancer Neg Hx    Social History   Tobacco Use   Smoking status: Never   Smokeless tobacco: Never  Substance  Use Topics   Alcohol use: No   Allergies  Allergen Reactions   Codeine Other (See Comments)    Other reaction(s): GI Intolerance Other Reaction: nausea   chest pain Nausea  Other reaction(s): GI Intolerance, Other (See Comments) Nausea GI Intolerance, nausea, chest pain    Propoxyphene Other (See Comments)    Other Reaction: nausea and chest pain   Alendronate     Other reaction(s): Other (See Comments) Chest pain   Ciprofloxacin Rash   Lisinopril     Other reaction(s): Cough   Amlodipine Swelling   Aspirin     Other reaction(s): Other (See Comments) Other reaction(s): Other (See Comments) Stomach hurt   Methotrexate Hives   Tramadol Nausea And Vomiting   Vicodin [Hydrocodone-Acetaminophen] Nausea And Vomiting   Prior to Admission medications   Medication Sig Start Date End Date Taking? Authorizing Provider  acetaminophen (TYLENOL) 325 MG tablet Take 650 mg by mouth every 6 (six) hours as needed.   Yes [provider]  albuterol (VENTOLIN HFA) 108 (90 Base) MCG/ACT inhaler Inhale 2 puffs into the lungs every 6 (six) hours as needed for wheezing or shortness of breath. 09/27/20  Yes [name], MD  carvedilol (COREG) 6.25 MG tablet Take 1 tablet (6.25 mg total) by mouth 2 (two) times daily with a meal. 07/15/20  Yes Jennye Boroughs, MD  cetirizine (ZYRTEC) 10  MG tablet Take 1 tablet (10 mg total) by mouth daily. 08/23/20  Yes McDonough, Lauren K, PA-C  cloNIDine (CATAPRES) 0.1 MG tablet Take 1 tablet (0.1 mg total) by mouth 2 (two) times daily. 09/27/20  Yes [name], MD  cyclobenzaprine (FLEXERIL) 10 MG tablet Take 1 tablet (10 mg total) by mouth daily as needed for muscle spasms. 11/15/20  Yes Abernathy, Yetta Flock, NP  fluticasone (FLONASE) 50 MCG/ACT nasal spray Place 2 sprays into both nostrils daily. 03/23/20  Yes Luiz Ochoa, NP  furosemide (LASIX) 20 MG tablet Take 2 tablets (40 mg total) by mouth daily. 07/15/20  Yes Jennye Boroughs, MD  gabapentin (NEURONTIN)  100 MG capsule Take 2 capsules (200 mg total) by mouth at bedtime. 08/23/20  Yes McDonough, Lauren K, PA-C  hydrALAZINE (APRESOLINE) 50 MG tablet TAKE 2 TABLETS(100 MG) BY MOUTH THREE TIMES DAILY 11/19/20  Yes [name], MD  latanoprost (XALATAN) 0.005 % ophthalmic solution Place 1 drop into both eyes at bedtime.  01/16/14  Yes [provider]  levothyroxine (SYNTHROID, LEVOTHROID) 88 MCG tablet Take 88 mcg by mouth daily before breakfast.  03/11/17  Yes [provider]  montelukast (SINGULAIR) 10 MG tablet Take 1 tablet (10 mg total) by mouth daily. 08/23/20  Yes McDonough, Si Gaul, PA-C  Multiple Vitamins-Minerals (MULTIVITAMIN GUMMIES WOMENS) CHEW Chew 2 tablets by mouth daily.   Yes [provider]  ondansetron (ZOFRAN ODT) 4 MG disintegrating tablet Take 1 tablet (4 mg total) by mouth every 8 (eight) hours as needed. 06/25/20  Yes Menshew, Dannielle Karvonen, PA-C  pantoprazole (PROTONIX) 40 MG tablet TAKE 1 TABLET BY MOUTH AS NEEDED Patient taking differently: Take 40 mg by mouth daily. 04/18/19  Yes Boscia, Greer Ee, NP  pravastatin (PRAVACHOL) 20 MG tablet Take 1 tablet (20 mg total) by mouth at bedtime. 11/15/20  Yes Abernathy, Alyssa, NP  sacubitril-valsartan (ENTRESTO) 24-26 MG Take 1 tablet by mouth 2 (two) times daily. 09/29/20  Yes Kysa Calais, Otila Kluver A, FNP  timolol (TIMOPTIC) 0.25 % ophthalmic solution Place 1 drop into both eyes 2 (two) times daily.  07/30/09  Yes [provider]  clobetasol ointment (TEMOVATE) 3.87 % Apply 1 application topically 2 (two) times daily as needed (rash).  Patient not taking: Reported on 12/09/2020 02/06/16   [provider]  fluticasone (FLOVENT HFA) 110 MCG/ACT inhaler Inhale 1 puff into the lungs 2 (two) times daily. Patient not taking: Reported on 12/09/2020 09/27/20   [name], MD    Review of Systems  Constitutional:  Positive for fatigue (minimal). Negative for appetite change.  HENT:  Negative for congestion,  postnasal drip and sore throat.   Eyes: Negative.   Respiratory:  Negative for cough and shortness of breath.   Cardiovascular:  Negative for chest pain, palpitations and leg swelling.  Gastrointestinal:  Negative for abdominal distention and abdominal pain.  Endocrine: Negative.   Genitourinary: Negative.   Musculoskeletal:  Positive for arthralgias (hands/ thumb). Negative for back pain.  Skin: Negative.   Allergic/Immunologic: Negative.   Neurological:  Negative for dizziness and light-headedness.  Hematological:  Negative for adenopathy. Does not bruise/bleed easily.  Psychiatric/Behavioral:  Positive for sleep disturbance (sleeping on 2 pillows; not sleeping well). Negative for dysphoric mood. The patient is not nervous/anxious.    Vitals:   12/09/20 1048  BP: (!) 130/51  Pulse: 64  Resp: 18  SpO2: 100%  Weight: 143 lb 8 oz (65.1 kg)  Height: 4\' 9"  (1.448 m)  Wt Readings from Last 3 Encounters:  12/09/20 143 lb 8 oz (65.1 kg)  12/07/20 144 lb (65.3 kg)  11/15/20 143 lb 3.2 oz (65 kg)   Lab Results  Component Value Date   CREATININE 1.32 (H) 09/28/2020   CREATININE 1.73 (H) 08/05/2020   CREATININE 1.53 (H) 07/29/2020    Physical Exam Vitals and nursing note reviewed. Exam conducted with a chaperone present.  Constitutional:      Appearance: Normal appearance.  HENT:     Head: Normocephalic and atraumatic.     Right Ear: Decreased hearing noted.     Left Ear: Decreased hearing noted.  Cardiovascular:     Rate and Rhythm: Normal rate and regular rhythm.  Pulmonary:     Effort: Pulmonary effort is normal. No respiratory distress.     Breath sounds: No wheezing or rales.  Abdominal:     General: There is no distension.     Palpations: Abdomen is soft.     Tenderness: There is no abdominal tenderness.  Musculoskeletal:        General: No tenderness.     Cervical back: Normal range of motion and neck supple.     Right lower leg: No edema.     Left lower leg: No  edema.  Skin:    General: Skin is warm and dry.  Neurological:     General: No focal deficit present.     Mental Status: She is alert and oriented to person, place, and time.  Psychiatric:        Mood and Affect: Mood normal.        Behavior: Behavior normal.        Thought Content: Thought content normal.   Assessment & Plan:  1: Chronic heart failure with reduced ejection fraction- - NYHA class II - euvolemic today - weighing daily; reminded to call for an overnight weight gain of > 2 pounds or a weekly weight gain of > 5 pounds - weight stable from last visit here 2 months ago - not adding salt and has been reading food labels for sodium content - saw cardiology [name]) 09/15/20 - on GDMT of carvedilol and entresto - will get BMP today & call her after lab results received - consider adding spironolactone/ SGLT2 if lab work allows - receiving entresto through [employer] patient assistance program - participating in paramedicine program - BNP 07/18/20 was 68.3  2: HTN with CKD- - BP looks good today - had virtual visit with PCP (McDonough) 08/26/20 - BMP 12/09/20 reviewed and showed sodium 138, potassium 4.3, creatinine 1.31 and GFR 43   Patient did not bring her medications nor a list. Each medication was verbally reviewed with the patient and she was encouraged to bring the bottles to every visit to confirm accuracy of list.  Return in 1 month or sooner for any questions/problems before then.

## 2020-12-09 NOTE — Patient Instructions (Addendum)
Continue weighing daily and call for an overnight weight gain of > 2 pounds or a weekly weight gain of >5 pounds.     We will call you after we get your lab results back.

## 2020-12-10 ENCOUNTER — Telehealth: Payer: Self-pay

## 2020-12-10 ENCOUNTER — Other Ambulatory Visit: Payer: Self-pay | Admitting: Family

## 2020-12-10 MED ORDER — DAPAGLIFLOZIN PROPANEDIOL 10 MG PO TABS: 10.0000 mg | ORAL_TABLET | Freq: Every day | ORAL | 5 refills | Status: DC

## 2020-12-10 NOTE — Telephone Encounter (Addendum)
Patient notified of the message below, informed to pick up free 30 day farxiga 10MG  at [facility] and we will work on setting up assistance for medication at next appt.  Pt informed to call us for any questions or concerns. Pt acknowledged.  Georg Ruddle, RN  ----- Message from Alisa Graff, [place] sent at 12/10/2020 10:07 AM EDT ----- Labs are stable. Continue entresto and other medications at current dose. Will start farxiga 10mg  once a day & 30 day coupon sent to [facility] pharmacy so you get the first 30 days free. We can do patient assistance for this medication at next visit. This is one of the other HF medications we talked about starting.

## 2020-12-10 NOTE — Progress Notes (Signed)
Starting farxiga 10mg  daily. Voucher information sent with RX. Check labs at next visit

## 2020-12-21 ENCOUNTER — Other Ambulatory Visit (HOSPITAL_COMMUNITY): Payer: Self-pay

## 2020-12-21 NOTE — Progress Notes (Signed)
Stacey [name] contacted me yesterday stating her [name] was ordered 2 weeks ago and has not showed.  Had a home visit with her to see about her Entresto.  Contacted the foundation and they states pharmacy is working on it now and will be delivered on Friday.  She will have her son pick up sample from [facility] tomorrow at the HF clinic.  She is concerned about them delivering to right address, doubled check to make sure they had the right address.  She will call me if delivered or if she does not get it.  She was having problems finding transportation to appt on Friday, she contacted and it is for a telephone visit.  She is going on vacation next week and looking forward to it.  She is also in a dance group, she has been doing well and feeling good.  She states weight is 141 lbs.  Will continue to visit for heart failure.   [place] [phone]

## 2020-12-22 NOTE — Progress Notes (Signed)
Chronic Care Management Pharmacy Note  12/27/2020 Name:  Stacey [name] MRN:  [id number] DOB:  May 01, 1945   Subjective: Stacey [name] is an 75 y.o. year old female who is a primary patient of Humphrey Rolls Timoteo Gaul, MD.  The CCM team was consulted for assistance with disease management and care coordination needs.    Engaged with patient face to face for initial visit in response to provider referral for pharmacy case management and/or care coordination services.   Consent to Services:  The patient was given the following information about Chronic Care Management services today, agreed to services, and gave verbal consent: 1. CCM service includes personalized support from designated clinical staff supervised by the primary care provider, including individualized plan of care and coordination with other care providers 2. 24/7 contact phone numbers for assistance for urgent and routine care needs. 3. Service will only be billed when office clinical staff spend 20 minutes or more in a month to coordinate care. 4. Only one practitioner may furnish and bill the service in a calendar month. 5.The patient may stop CCM services at any time (effective at the end of the month) by phone call to the office staff. 6. The patient will be responsible for cost sharing (co-pay) of up to 20% of the service fee (after annual deductible is met). Patient agreed to services and consent obtained.  Patient Care Team: [name], MD as PCP - General (Internal Medicine) Edythe Clarity, [facility] as Pharmacist (Pharmacist)  Recent office visits:  08/26/20 Mylinda Latina, PA-C. For COVID-19. STARTED Benzonatate 100 mg 2 times daily PRN. 08/23/20 McDonough, Si Gaul, PA-C. For follow-up. No medication changes. 06/14/20 McDonough, Si Gaul, PA-C. For follow-up. Per note: BP still elevated at home and in office, will have pt take 2 tabs of hydralazine TID and continue other medications as before. 05/17/20 McDonough, Si Gaul,  PA-C. For follow-up. No medication changes.    Recent consult visits:  08/20/20 Cardiology Alisa Graff, FNP. Per note: When you start entresto, you will take it as 1 tablet twice a day and you won't take losartan anymore 08/19/20 Cardiology Flossie Dibble, MD. No medication changes. 07/29/20 Cardiology Alisa Graff, FNP. (Telephone) STARTED Potassium Chloride Crys ER 20 MEQ. 07/29/20 Cardiology Alisa Graff, FNP. For chronic systolic heart failure. No medication changes. 07/26/20 Urology Billey Co, MD. For urinary retention. STOPPED Albuterol, Fluticasone and cyclosporine. 07/05/20 Ophthalmology Lad, Caesar Bookman, MD. No medication changes. 04/29/20 Ophthalmology Darrin Luis, Karis Juba. MD. No medication changes. 04/06/20 Endocrinology Solum, Betsey Holiday. MD. No information available. 03/30/20 Endocrinology Solum, Betsey Holiday. MD. No information available.   Hospital visits:  07/18/20 [facility] (4 days)Kc, Maren Beach, MD. For UTI. STOPPED Hydrocodone-Acetaminophen. 07/12/20 [facility] (3 days) Jennye Boroughs, MD.STOPPED Colchicine, Ketorolac, Naproxen. 06/25/20 [facility] Emergency Department ( 2 Hours) Naaman Plummer, MD. For back pain. STARTED Cyclobenzaprine 10 mg 3 times daily PRN, Hydrocodone-Acetaminophen 5-325 MG 1 tablet 3 times daily PRN, Ketorolac Tromethamine 10 mg every 8 hours and Ondansetron 4 mg every 8 hours PRN.   Medication History: Gabapentin 100 mg 30 DS 09/19/20 Losartan 100 mg 90 DS 07/27/20 Pravastatin 20 mg 90 DS 08/11/20   Objective:  Lab Results  Component Value Date   CREATININE 1.31 (H) 12/09/2020   BUN 24 (H) 12/09/2020   GFRNONAA 43 (L) 12/09/2020   GFRAA 37 (L) 01/16/2018   NA 138 12/09/2020   K 4.3 12/09/2020  CALCIUM 10.6 (H) 12/09/2020   CO2 27 12/09/2020   GLUCOSE 126 (H) 12/09/2020    Lab Results  Component Value Date/Time   HGBA1C 6.2 (H) 07/13/2020 04:53 AM    Last diabetic Eye  exam: No results found for: HMDIABEYEEXA  Last diabetic Foot exam: No results found for: HMDIABFOOTEX   Lab Results  Component Value Date   CHOL 143 07/13/2020   HDL 49 07/13/2020   LDLCALC 83 07/13/2020   TRIG 57 07/13/2020   CHOLHDL 2.9 07/13/2020    Hepatic Function Latest Ref Rng & Units 07/20/2020 07/18/2020 07/12/2020  Total Protein 6.5 - 8.1 g/dL - 7.7 7.9  Albumin 3.5 - 5.0 g/dL 2.9(L) 3.6 3.7  AST 15 - 41 U/L - 31 36  ALT 0 - 44 U/L - 27 25  Alk Phosphatase 38 - 126 U/L - 65 73  Total Bilirubin 0.3 - 1.2 mg/dL - 0.8 0.9  Bilirubin, Direct 0.0 - 0.2 mg/dL - - -    Lab Results  Component Value Date/Time   TSH 1.422 01/13/2018 01:22 AM    CBC Latest Ref Rng & Units 07/29/2020 07/19/2020 07/18/2020  WBC 4.0 - 10.5 K/uL 4.7 9.5 11.5(H)  Hemoglobin 12.0 - 15.0 g/dL 11.8(L) 12.0 13.0  Hematocrit 36.0 - 46.0 % 36.8 36.1 38.8  Platelets 150 - 400 K/uL 315 193 235    No results found for: VD25OH  Clinical ASCVD: No  The 10-year ASCVD risk score (Arnett DK, et al., 2019) is: 9.9%   Values used to calculate the score:     Age: 42 years     Sex: Female     Is Non-Hispanic African American: Yes     Diabetic: No     Tobacco smoker: No     Systolic Blood Pressure: 110 mmHg     Is BP treated: Yes     HDL Cholesterol: 49 mg/dL     Total Cholesterol: 143 mg/dL    Depression screen [facility] 2/9 12/09/2020 11/15/2020 09/28/2020  Decreased Interest 0 0 0  Down, Depressed, Hopeless 0 0 0  PHQ - 2 Score 0 0 0     Social History   Tobacco Use  Smoking Status Never  Smokeless Tobacco Never   BP Readings from Last 3 Encounters:  12/09/20 (!) 130/51  12/07/20 (!) 142/60  11/15/20 140/80   Pulse Readings from Last 3 Encounters:  12/09/20 64  12/07/20 70  11/15/20 75   Wt Readings from Last 3 Encounters:  12/09/20 143 lb 8 oz (65.1 kg)  12/07/20 144 lb (65.3 kg)  11/15/20 143 lb 3.2 oz (65 kg)   BMI Readings from Last 3 Encounters:  12/09/20 31.05 kg/m  12/07/20 31.16 kg/m   11/15/20 30.99 kg/m    Assessment/Interventions: Review of patient past medical history, allergies, medications, health status, including review of consultants reports, laboratory and other test data, was performed as part of comprehensive evaluation and provision of chronic care management services.   SDOH:  (Social Determinants of Health) assessments and interventions performed: Yes  Financial Resource Strain: Low Risk    Difficulty of Paying Living Expenses: Not very hard    SDOH Screenings   Alcohol Screen: Low Risk    Last Alcohol Screening Score (AUDIT): 0  Depression (PHQ2-9): Low Risk    PHQ-2 Score: 0  Financial Resource Strain: Low Risk    Difficulty of Paying Living Expenses: Not very hard  Food Insecurity: Not on file  Housing: Not on file  Physical Activity: Not  on file  Social Connections: Not on file  Stress: Not on file  Tobacco Use: Low Risk    Smoking Tobacco Use: Never   Smokeless Tobacco Use: Never  Transportation Needs: Not on file    CCM Care Plan  Allergies  Allergen Reactions   Codeine Other (See Comments)    Other reaction(s): GI Intolerance Other Reaction: nausea   chest pain Nausea  Other reaction(s): GI Intolerance, Other (See Comments) Nausea GI Intolerance, nausea, chest pain    Propoxyphene Other (See Comments)    Other Reaction: nausea and chest pain   Alendronate     Other reaction(s): Other (See Comments) Chest pain   Ciprofloxacin Rash   Lisinopril     Other reaction(s): Cough   Amlodipine Swelling   Aspirin     Other reaction(s): Other (See Comments) Other reaction(s): Other (See Comments) Stomach hurt   Methotrexate Hives   Tramadol Nausea And Vomiting   Vicodin [Hydrocodone-Acetaminophen] Nausea And Vomiting    Medications Reviewed Today     Reviewed by Edythe Clarity, [facility] (Pharmacist) on 12/27/20 at 1126  Med List Status: <None>   Medication Order Taking? Sig Documenting Provider Last Dose Status Informant   acetaminophen (TYLENOL) 325 MG tablet [id number] Yes Take 650 mg by mouth every 6 (six) hours as needed. [provider] Taking Active   albuterol (VENTOLIN HFA) 108 (90 Base) MCG/ACT inhaler [id number] Yes Inhale 2 puffs into the lungs every 6 (six) hours as needed for wheezing or shortness of breath. [name], MD Taking Active            Med Note Malena Edman Sep 28, 2020 11:47 AM) Patient hasn't picked up the inhaler from pharmacy yet  carvedilol (COREG) 6.25 MG tablet [id number] Yes Take 1 tablet (6.25 mg total) by mouth 2 (two) times daily with a meal. Jennye Boroughs, MD Taking Active Self  cetirizine (ZYRTEC) 10 MG tablet [id number] Yes Take 1 tablet (10 mg total) by mouth daily. McDonough, Si Gaul, PA-C Taking Active   clobetasol ointment (TEMOVATE) 0.05 % [id number] Yes Apply 1 application topically 2 (two) times daily as needed (rash). [provider] Taking Active   cloNIDine (CATAPRES) 0.1 MG tablet [id number] Yes Take 1 tablet (0.1 mg total) by mouth 2 (two) times daily. [name], MD Taking Active   cyclobenzaprine (FLEXERIL) 10 MG tablet [id number] Yes Take 1 tablet (10 mg total) by mouth daily as needed for muscle spasms. Jonetta Osgood, NP Taking Active   dapagliflozin propanediol (FARXIGA) 10 MG TABS tablet [id number] Yes Take 1 tablet (10 mg total) by mouth daily before breakfast. Alisa Graff, FNP Taking Active   fluticasone (FLONASE) 50 MCG/ACT nasal spray [id number] Yes Place 2 sprays into both nostrils daily. Luiz Ochoa, NP Taking Active Self  fluticasone (FLOVENT HFA) 110 MCG/ACT inhaler [id number] Yes Inhale 1 puff into the lungs 2 (two) times daily. [name], MD Taking Active   furosemide (LASIX) 20 MG tablet [id number] Yes Take 2 tablets (40 mg total) by mouth daily. Jennye Boroughs, MD Taking Active Self  gabapentin (NEURONTIN) 100 MG capsule [id number] Yes Take 2 capsules (200 mg total) by mouth at bedtime. Mylinda Latina, PA-C  Taking Active   hydrALAZINE (APRESOLINE) 50 MG tablet [id number] Yes TAKE 2 TABLETS(100 MG) BY MOUTH THREE TIMES DAILY [name], MD Taking Active   latanoprost (XALATAN) 0.005 % ophthalmic solution [id number] Yes Place 1 drop into both eyes at bedtime.  [provider] Taking Active Self  levothyroxine (SYNTHROID, LEVOTHROID) 88 MCG tablet [id number] Yes Take 88 mcg by mouth daily before breakfast.  [provider] Taking Active Self  montelukast (SINGULAIR) 10 MG tablet [id number] Yes Take 1 tablet (10 mg total) by mouth daily. Mylinda Latina, PA-C Taking Active   Multiple Vitamins-Minerals (MULTIVITAMIN GUMMIES WOMENS) Sarina Ser [id number] Yes Chew 2 tablets by mouth daily. [provider] Taking Active Self  ondansetron (ZOFRAN ODT) 4 MG disintegrating tablet [id number] Yes Take 1 tablet (4 mg total) by mouth every 8 (eight) hours as needed. Menshew, Dannielle Karvonen, PA-C Taking Active   pantoprazole (PROTONIX) 40 MG tablet [id number] Yes TAKE 1 TABLET BY MOUTH AS NEEDED  Patient taking differently: Take 40 mg by mouth daily.   Ronnell Freshwater, NP Taking Active   pravastatin (PRAVACHOL) 20 MG tablet [id number] Yes Take 1 tablet (20 mg total) by mouth at bedtime. Jonetta Osgood, NP Taking Active   sacubitril-valsartan (ENTRESTO) 24-26 MG [id number] Yes Take 1 tablet by mouth 2 (two) times daily. Alisa Graff, FNP Taking Active   timolol (TIMOPTIC) 0.25 % ophthalmic solution [id number] Yes Place 1 drop into both eyes 2 (two) times daily.  [provider] Taking Active Self  Med List Note Dessie Coma, CPhT 01/13/18 0117): Patient's last name (as of 2 weeks ago) is now "Mee Hives," although legal documents have yet to be changed-            Patient Active Problem List   Diagnosis Date Noted   AKI (acute kidney injury) ([place]) 07/19/2020   UTI (urinary tract infection) 07/18/2020   H/O urinary retention 07/18/2020   Hyponatremia 07/18/2020   Acute on  chronic combined systolic and diastolic CHF (congestive heart failure) ([place]) 07/12/2020   Hypertensive emergency 07/12/2020   Elevated troponin 07/12/2020   Hypothyroidism 07/12/2020   Neuralgia and neuritis 03/14/2020   Primary generalized (osteo)arthritis 11/30/2019   Encounter for screening mammogram for malignant neoplasm of breast 11/30/2019   Vasomotor rhinitis 06/28/2019   Atopic dermatitis 06/28/2019   Localized superficial swelling, mass, or lump 06/28/2019   Plantar neuroma of left foot 06/28/2019   Chronic obstructive pulmonary disease ([place]) 11/03/2018   Right flank pain 11/03/2018   Dysuria 11/03/2018   Acute upper respiratory infection 05/06/2018   Acquired hypothyroidism 03/18/2018   CKD (chronic kidney disease), stage IIIa 03/18/2018   ARF (acute renal failure) ([place]) 01/14/2018   Hypertensive crisis 01/14/2018   Acute respiratory failure with hypoxia ([place]) 01/13/2018   Encounter for general adult medical examination with abnormal findings 10/26/2017   Allergic rhinitis 10/26/2017   Vitamin D deficiency 10/26/2017   Gastroesophageal reflux disease without esophagitis 10/26/2017   Essential hypertension 04/16/2017   Thyroid disease 04/16/2017   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 09/15/2016   Dilated cardiomyopathy ([place]) 06/27/2016   Bilateral carotid artery stenosis 05/30/2016   SOBOE (shortness of breath on exertion) 05/30/2016   Hyperparathyroidism, primary ([place]) 08/25/2015   Hypercalcemia 08/12/2014   LVH (left ventricular hypertrophy) due to hypertensive disease, without heart failure 07/09/2014   Moderate mitral insufficiency 07/09/2014   Benign essential hypertension 07/06/2014   Osteoporosis, 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   Mixed hyperlipidemia 01/08/2014   Chronic pelvic pain in female 05/31/2011   Fibroids 05/31/2011   Sarcoidosis 05/31/2011    Immunization History  Administered Date(s) Administered   Influenza, High Dose Seasonal PF  01/14/2018   Influenza-Unspecified 05/22/2017   PFIZER(Purple Top)SARS-COV-2 Vaccination 05/10/2019, 06/04/2019, 12/24/2019   Pneumococcal Conjugate-13 05/22/2017  Pneumococcal Polysaccharide-23 01/14/2018   Pneumococcal-Unspecified 05/22/2017    Conditions to be addressed/monitored:  HTN w/ CKD Stage 3a, CHF, COPD, Allergic Rhinitis, GERD, Hypothyroidism, HLD  Care Plan : General Pharmacy (Adult)  Updates made by Edythe Clarity, RPH since 12/27/2020 12:00 AM     Problem: HTN w/ CKD Stage 3a, CHF, COPD, Allergic Rhinitis, GERD, Hypothyroidism, HLD   Priority: High  Onset Date: 09/22/2020     Goal: Patient-Specific Goal   Note:   Current Barriers:  Unable to achieve control of fluid status, SOB   Pharmacist Clinical Goal(s):  Patient will achieve adherence to monitoring guidelines and medication adherence to achieve therapeutic efficacy achieve control of SOB as evidenced by symptom level adhere to plan to optimize therapeutic regimen for HF as evidenced by report of adherence to recommended medication management changes contact provider office for questions/concerns as evidenced notation of same in electronic health record through collaboration with PharmD and provider.   Interventions: 1:1 collaboration with [name], MD regarding development and update of comprehensive plan of care as evidenced by provider attestation and co-signature Inter-disciplinary care team collaboration (see longitudinal plan of care) Comprehensive medication review performed; medication list updated in electronic medical record  Hypertension (BP goal <140/90) -Controlled -Current treatment: Carvedilol 6.37m BID with a meal Hydralazine 533mtwo tablets tid Clonidine 0.40m52mID Entresto 24-75m29mily -Medications previously tried: Losartan (d/c when started EntrColtonCurrent home readings: 130s/80s, not monitored as much at home due to all of her MD appointments -Current dietary habits:  watching her salt intake -Current exercise habits: some walking outside, not much lately due to heat -Denies hypotensive/hypertensive symptoms -Educated on BP goals and benefits of medications for prevention of heart attack, stroke and kidney damage; Daily salt intake goal < 2300 mg; Importance of home blood pressure monitoring; Symptoms of hypotension and importance of maintaining adequate hydration; -Counseled to monitor BP at home a few times weekly, document, and provide log at future appointments -Recommended to continue current medication   Hyperlipidemia: (LDL goal < 100) -Controlled -Current treatment: Pravastatin 20mg42mly -Medications previously tried: none noted -Most recent LDL is controlled, reports 100% adherence with medication -Educated on Cholesterol goals;  Benefits of statin for ASCVD risk reduction; Importance of limiting foods high in cholesterol; -Recommended to continue current medication  Heart Failure (Goal: manage symptoms and prevent exacerbations) -Controlled -Last ejection fraction: 25%  -HF type: Combined Systolic and Diastolic  -Current treatment: Furosemide 20mg 56mtablets po daily (AM) Entresto 26-75mg d440m - gets for free through PAP program Carvedilol 6.25mg bi240medications previously tried: Losartan 100mg dai29m-Current home BP/HR readings: 130s/80s  -Educated on Benefits of medications for managing symptoms and prolonging life Importance of weighing daily; if you gain more than 3 pounds in one day or 5 pounds in one week, contact providers Proper diuretic administration and potassium supplementation She reports she is feeling much better since starting Entresto, no issues with cost at the moment. -Recommended to continue current medication Continue to monitor weight.  Update 12/24/20 Patient is still feeling great after starting her Entresto,Delene Lollmonitoring her weight at home and denies any swelling or SOB.  She reports her copays  are manageable at this time.  Encouraged her to continue to monitor weight, contact us if sheKoreaains > 3 lbs in one day or 5 lbs in on week.  Patient understands.  No changes to meds at this time  COPD (Goal: control symptoms and prevent exacerbations) -Not ideally  controlled -Current treatment  None at this time Flovent Hfa 116mg  Albuterol HFA 90 mcg prn  -Medications previously tried: none noted   -Pulmonary function testing: Pulmonary Functions Testing Results:  No results found for: FEV1, FVC, FEV1FVC, TLC, DLCO  -Exacerbations requiring treatment in last 6 months: none -Patient denies consistent use of maintenance inhaler -Frequency of rescue inhaler use: does not have at home -Counseled on Proper inhaler technique; Benefits of consistent maintenance inhaler use When to use rescue inhaler Differences between maintenance and rescue inhalers -Recommended she try and use GoodRx coupon for rescue inhaler, will look into patient assistance for Flovent as she would qualify based on her reported income today.  Update 12/24/20 Denies any recent SOB, doing well overall. She has inhalers at home now. Continue meds for now, can use Flovent daily as maintenance inhaler - patient understands.   Hypothyroidism (Goal: maintain TSH) -Controlled -Current treatment  Levothyroxine 822m daily -Medications previously tried: none noted -Takes appropriately, TSH currently WNL  -Recommended to continue current medication  GERD (Goal: Control symptoms) -Controlled -Current treatment  Pantoprazole 4065maily -Medications previously tried: none noted -Denies current symptoms, takes appropriately  -Recommended to continue current medication  Patient Goals/Self-Care Activities Patient will:  - take medications as prescribed weigh daily, and contact provider if weight gain of 3 lbs in one day or 5 lbs in one week target a minimum of 150 minutes of moderate intensity exercise  weekly  Follow Up Plan: The care management team will reach out to the patient again over the next 90 days.             Medication Assistance:  Entresto obtained through manufacturer medication assistance program.  Enrollment ends 03/19/21  Compliance/Adherence/Medication fill history: Care Gaps: Zoster  Star-Rating Drugs: Gabapentin 100 mg 30 DS 09/19/20 Losartan 100 mg 90 DS 07/27/20 Pravastatin 20 mg 90 DS 08/11/20  Patient's preferred pharmacy is:  [facility] [place] [id number]: [phone]: [id number]: [phone]: [phone] pill box? Yes Pt endorses 100% compliance  We discussed: Benefits of medication synchronization, packaging and delivery as well as enhanced pharmacist oversight with [employer]. Patient decided to: Continue current medication management strategy  Care Plan and Follow Up Patient Decision:  Patient agrees to Care Plan and Follow-up.  Plan: The care management team will reach out to the patient again over the next 90 days.  ChrBeverly MilchharmD Clinical Pharmacist [phone]

## 2020-12-23 DIAGNOSIS — Z23 Encounter for immunization: Secondary | ICD-10-CM | POA: Diagnosis not present

## 2020-12-24 ENCOUNTER — Ambulatory Visit: Payer: Medicare Other | Admitting: Pharmacist

## 2020-12-24 DIAGNOSIS — I5043 Acute on chronic combined systolic (congestive) and diastolic (congestive) heart failure: Secondary | ICD-10-CM

## 2020-12-24 DIAGNOSIS — J449 Chronic obstructive pulmonary disease, unspecified: Secondary | ICD-10-CM

## 2020-12-27 NOTE — Patient Instructions (Addendum)
Visit Information   Goals Addressed             This Visit's Progress    Track and Manage Fluids and Swelling-Heart Failure   On track    Timeframe:  Long-Range Goal Priority:  High Start Date:  09/22/20                           Expected End Date:  03/25/20                     Follow Up Date 01/16/21    - call office if I gain more than 2 pounds in one day or 5 pounds in one week - keep legs up while sitting - track weight in diary - use salt in moderation - watch for swelling in feet, ankles and legs every day - weigh myself daily    Why is this important?   It is important to check your weight daily and watch how much salt and liquids you have.  It will help you to manage your heart failure.    Notes:        Patient Care Plan: General Pharmacy (Adult)     Problem Identified: HTN w/ CKD Stage 3a, CHF, COPD, Allergic Rhinitis, GERD, Hypothyroidism, HLD   Priority: High  Onset Date: 09/22/2020     Goal: Patient-Specific Goal   Note:   Current Barriers:  Unable to achieve control of fluid status, SOB   Pharmacist Clinical Goal(s):  Patient will achieve adherence to monitoring guidelines and medication adherence to achieve therapeutic efficacy achieve control of SOB as evidenced by symptom level adhere to plan to optimize therapeutic regimen for HF as evidenced by report of adherence to recommended medication management changes contact provider office for questions/concerns as evidenced notation of same in electronic health record through collaboration with PharmD and provider.   Interventions: 1:1 collaboration with [name], MD regarding development and update of comprehensive plan of care as evidenced by provider attestation and co-signature Inter-disciplinary care team collaboration (see longitudinal plan of care) Comprehensive medication review performed; medication list updated in electronic medical record  Hypertension (BP goal  <140/90) -Controlled -Current treatment: Carvedilol 6.25mg  BID with a meal Hydralazine 50mg  two tablets tid Clonidine 0.1mg  BID Entresto 24-26mg  daily -Medications previously tried: Losartan (d/c when started [place])  -Current home readings: 130s/80s, not monitored as much at home due to all of her MD appointments -Current dietary habits: watching her salt intake -Current exercise habits: some walking outside, not much lately due to heat -Denies hypotensive/hypertensive symptoms -Educated on BP goals and benefits of medications for prevention of heart attack, stroke and kidney damage; Daily salt intake goal < 2300 mg; Importance of home blood pressure monitoring; Symptoms of hypotension and importance of maintaining adequate hydration; -Counseled to monitor BP at home a few times weekly, document, and provide log at future appointments -Recommended to continue current medication   Hyperlipidemia: (LDL goal < 100) -Controlled -Current treatment: Pravastatin 20mg  daily -Medications previously tried: none noted -Most recent LDL is controlled, reports 100% adherence with medication -Educated on Cholesterol goals;  Benefits of statin for ASCVD risk reduction; Importance of limiting foods high in cholesterol; -Recommended to continue current medication  Heart Failure (Goal: manage symptoms and prevent exacerbations) -Controlled -Last ejection fraction: 25%  -HF type: Combined Systolic and Diastolic  -Current treatment: Furosemide 20mg  two tablets po daily (AM) Entresto 26-26mg  daily - gets for free through  PAP program Carvedilol 6.25mg  bid -Medications previously tried: Losartan 100mg  daily  -Current home BP/HR readings: 130s/80s  -Educated on Benefits of medications for managing symptoms and prolonging life Importance of weighing daily; if you gain more than 3 pounds in one day or 5 pounds in one week, contact providers Proper diuretic administration and potassium  supplementation She reports she is feeling much better since starting Entresto, no issues with cost at the moment. -Recommended to continue current medication Continue to monitor weight.  Update 12/24/20 Patient is still feeling great after starting her [name], she is monitoring her weight at home and denies any swelling or SOB.  She reports her copays are manageable at this time.  Encouraged her to continue to monitor weight, contact us if she gains > 3 lbs in one day or 5 lbs in on week.  Patient understands.  No changes to meds at this time  COPD (Goal: control symptoms and prevent exacerbations) -Not ideally controlled -Current treatment  None at this time Flovent Hfa 182mcg  Albuterol HFA 90 mcg prn  -Medications previously tried: none noted   -Pulmonary function testing: Pulmonary Functions Testing Results:  No results found for: FEV1, FVC, FEV1FVC, TLC, DLCO  -Exacerbations requiring treatment in last 6 months: none -Patient denies consistent use of maintenance inhaler -Frequency of rescue inhaler use: does not have at home -Counseled on Proper inhaler technique; Benefits of consistent maintenance inhaler use When to use rescue inhaler Differences between maintenance and rescue inhalers -Recommended she try and use GoodRx coupon for rescue inhaler, will look into patient assistance for Flovent as she would qualify based on her reported income today.  Update 12/24/20 Denies any recent SOB, doing well overall. She has inhalers at home now. Continue meds for now, can use Flovent daily as maintenance inhaler - patient understands.   Hypothyroidism (Goal: maintain TSH) -Controlled -Current treatment  Levothyroxine 72mcg daily -Medications previously tried: none noted -Takes appropriately, TSH currently WNL  -Recommended to continue current medication  GERD (Goal: Control symptoms) -Controlled -Current treatment  Pantoprazole 40mg  daily -Medications previously  tried: none noted -Denies current symptoms, takes appropriately  -Recommended to continue current medication  Patient Goals/Self-Care Activities Patient will:  - take medications as prescribed weigh daily, and contact provider if weight gain of 3 lbs in one day or 5 lbs in one week target a minimum of 150 minutes of moderate intensity exercise weekly  Follow Up Plan: The care management team will reach out to the patient again over the next 90 days.            Patient verbalizes understanding of instructions provided today and agrees to view in [place].  Telephone follow up appointment with pharmacy team member scheduled for: 6 months  Edythe Clarity, [place]

## 2020-12-30 ENCOUNTER — Other Ambulatory Visit: Payer: Self-pay | Admitting: Physician Assistant

## 2020-12-30 DIAGNOSIS — M792 Neuralgia and neuritis, unspecified: Secondary | ICD-10-CM

## 2021-01-10 NOTE — Progress Notes (Signed)
Patient ID: Stacey [name], female    DOB: 1945-11-03, 75 y.o.   MRN: [id number]  HPI  Stacey [name] is a 75 y/o female with a history of HTN, CKD, thyroid disease, COPD and chronic heart failure.   Echo report from 07/12/20 reviewed and showed an EF of 20-25% along with mild MR/AR/ AS.   Admitted 07/18/20 due to suprapubic pain with foley catheter in place. Diagnosed with UTI and placed on IV antibiotics. Renal ultrasound showed mild right hydronephrosis with left renal cyst. Discharged after 4 days. Admitted 07/12/20 due to acute on chronic HF with severe HTN. Cardiology consult obtained. Given IV lasix, IV NTG infusion and multiple medications for her HTN. Initially required bipap. Elevated troponins thought to be due to demand ischemia. Weaned off bipap/ oxygen. Failed voiding trial so foley inserted. Discharged after 3 days.   She presents today for a follow-up visit with a chief complaint of minimal fatigue upon moderate exertion. She describes this as chronic in nature having been present for several years. She has associated shortness of breath (since being out of clonidine) and associated pain in her hands along with this. She denies any difficulty sleeping, dizziness, abdominal distention, palpitations, pedal edema, chest pain, cough or weight gain.   Has tolerated farxiga without known side effects.   She says that she recently returned from a 5 day vacation at [facility] and didn't realize that she didn't have her clonidine with her so went without it for 5 days. She does note some shortness of breath during this time. She is going to the pharmacy after leaving here to pick it up.   Past Medical History:  Diagnosis Date   CHF (congestive heart failure) (HCC)    Chronic kidney disease    COPD (chronic obstructive pulmonary disease) ([place])    Hypertension    Sarcoidosis    Thyroid disease    Past Surgical History:  Procedure Laterality Date   CATARACT EXTRACTION     EYE SURGERY Right  07/09/2019   GALLBLADDER SURGERY     KNEE SURGERY     thyroidectomy     Family History  Problem Relation Age of Onset   Hypertension Mother    Diabetes Mother    Anuerysm Mother    Diabetes Father    Hypertension Father    Prostate cancer Neg Hx    Bladder Cancer Neg Hx    Kidney cancer Neg Hx    Social History   Tobacco Use   Smoking status: Never   Smokeless tobacco: Never  Substance Use Topics   Alcohol use: No   Allergies  Allergen Reactions   Codeine Other (See Comments)    Other reaction(s): GI Intolerance Other Reaction: nausea   chest pain Nausea  Other reaction(s): GI Intolerance, Other (See Comments) Nausea GI Intolerance, nausea, chest pain    Propoxyphene Other (See Comments)    Other Reaction: nausea and chest pain   Alendronate     Other reaction(s): Other (See Comments) Chest pain   Ciprofloxacin Rash   Lisinopril     Other reaction(s): Cough   Amlodipine Swelling   Aspirin     Other reaction(s): Other (See Comments) Other reaction(s): Other (See Comments) Stomach hurt   Methotrexate Hives   Tramadol Nausea And Vomiting   Vicodin [Hydrocodone-Acetaminophen] Nausea And Vomiting   Prior to Admission medications   Medication Sig Start Date End Date Taking? Authorizing Provider  acetaminophen (TYLENOL) 325 MG tablet Take 650 mg by mouth  every 6 (six) hours as needed.   Yes [provider]  albuterol (VENTOLIN HFA) 108 (90 Base) MCG/ACT inhaler Inhale 2 puffs into the lungs every 6 (six) hours as needed for wheezing or shortness of breath. 09/27/20  Yes [name], MD  carvedilol (COREG) 6.25 MG tablet Take 1 tablet (6.25 mg total) by mouth 2 (two) times daily with a meal. 07/15/20  Yes Jennye Boroughs, MD  cetirizine (ZYRTEC) 10 MG tablet Take 1 tablet (10 mg total) by mouth daily. 08/23/20  Yes Stacey [name], Lauren K, PA-C  cloNIDine (CATAPRES) 0.1 MG tablet Take 1 tablet (0.1 mg total) by mouth 2 (two) times daily. 09/27/20  Yes [name],  MD  cyclobenzaprine (FLEXERIL) 10 MG tablet Take 1 tablet (10 mg total) by mouth daily as needed for muscle spasms. 11/15/20  Yes Abernathy, Yetta Flock, NP  dapagliflozin propanediol (FARXIGA) 10 MG TABS tablet Take 1 tablet (10 mg total) by mouth daily before breakfast. 12/10/20  Yes Maritza Hosterman A, FNP  fluticasone (FLONASE) 50 MCG/ACT nasal spray Place 2 sprays into both nostrils daily. 03/23/20  Yes Luiz Ochoa, NP  fluticasone (FLOVENT HFA) 110 MCG/ACT inhaler Inhale 1 puff into the lungs 2 (two) times daily. 09/27/20  Yes [name], MD  furosemide (LASIX) 20 MG tablet Take 2 tablets (40 mg total) by mouth daily. 07/15/20  Yes Jennye Boroughs, MD  gabapentin (NEURONTIN) 100 MG capsule TAKE 2 CAPSULES(200 MG) BY MOUTH AT BEDTIME 12/31/20  Yes Stacey [name], Lauren K, PA-C  hydrALAZINE (APRESOLINE) 50 MG tablet TAKE 2 TABLETS(100 MG) BY MOUTH THREE TIMES DAILY 11/19/20  Yes [name], MD  latanoprost (XALATAN) 0.005 % ophthalmic solution Place 1 drop into both eyes at bedtime.  01/16/14  Yes [provider]  levothyroxine (SYNTHROID, LEVOTHROID) 88 MCG tablet Take 88 mcg by mouth daily before breakfast.  03/11/17  Yes [provider]  MELATONIN CR PO Take by mouth.   Yes [provider]  montelukast (SINGULAIR) 10 MG tablet Take 1 tablet (10 mg total) by mouth daily. 08/23/20  Yes Stacey [name], Si Gaul, PA-C  Multiple Vitamins-Minerals (MULTIVITAMIN GUMMIES WOMENS) CHEW Chew 2 tablets by mouth daily.   Yes [provider]  ondansetron (ZOFRAN ODT) 4 MG disintegrating tablet Take 1 tablet (4 mg total) by mouth every 8 (eight) hours as needed. 06/25/20  Yes Menshew, Dannielle Karvonen, PA-C  pantoprazole (PROTONIX) 40 MG tablet TAKE 1 TABLET BY MOUTH AS NEEDED Patient taking differently: Take 40 mg by mouth daily. 04/18/19  Yes Boscia, Greer Ee, NP  pravastatin (PRAVACHOL) 20 MG tablet Take 1 tablet (20 mg total) by mouth at bedtime. 11/15/20  Yes Abernathy, Alyssa, NP   sacubitril-valsartan (ENTRESTO) 24-26 MG Take 1 tablet by mouth 2 (two) times daily. 09/29/20  Yes Amaurie Schreckengost, Otila Kluver A, FNP  timolol (TIMOPTIC) 0.25 % ophthalmic solution Place 1 drop into both eyes 2 (two) times daily.  07/30/09  Yes [provider]    Review of Systems  Constitutional:  Positive for fatigue (minimal). Negative for appetite change.  HENT:  Positive for rhinorrhea. Negative for congestion, postnasal drip and sore throat.   Eyes: Negative.   Respiratory:  Positive for shortness of breath (since being out of clonidine). Negative for cough.   Cardiovascular:  Negative for chest pain, palpitations and leg swelling.  Gastrointestinal:  Negative for abdominal distention and abdominal pain.  Endocrine: Negative.   Genitourinary: Negative.   Musculoskeletal:  Positive for arthralgias (hands/ thumb). Negative for back  pain.  Skin: Negative.   Allergic/Immunologic: Negative.   Neurological:  Negative for dizziness and light-headedness.  Hematological:  Negative for adenopathy. Does not bruise/bleed easily.  Psychiatric/Behavioral:  Negative for dysphoric mood and sleep disturbance (sleeping on 2 pillows; taking melatonin). The patient is not nervous/anxious.    Vitals:   01/11/21 1048  BP: (!) 150/72  Pulse: 85  Resp: 18  SpO2: 98%  Weight: 137 lb 2 oz (62.2 kg)  Height: 4\' 9"  (1.448 m)   Wt Readings from Last 3 Encounters:  01/11/21 137 lb 2 oz (62.2 kg)  12/09/20 143 lb 8 oz (65.1 kg)  12/07/20 144 lb (65.3 kg)   Lab Results  Component Value Date   CREATININE 1.31 (H) 12/09/2020   CREATININE 1.32 (H) 09/28/2020   CREATININE 1.73 (H) 08/05/2020    Physical Exam Vitals and nursing note reviewed. Exam conducted with a chaperone present.  Constitutional:      Appearance: Normal appearance.  HENT:     Head: Normocephalic and atraumatic.     Right Ear: Decreased hearing noted.     Left Ear: Decreased hearing noted.  Cardiovascular:     Rate and Rhythm: Normal  rate and regular rhythm.  Pulmonary:     Effort: Pulmonary effort is normal. No respiratory distress.     Breath sounds: No wheezing or rales.  Abdominal:     General: There is no distension.     Palpations: Abdomen is soft.     Tenderness: There is no abdominal tenderness.  Musculoskeletal:        General: No tenderness.     Cervical back: Normal range of motion and neck supple.     Right lower leg: No edema.     Left lower leg: No edema.  Skin:    General: Skin is warm and dry.  Neurological:     General: No focal deficit present.     Mental Status: She is alert and oriented to person, place, and time.  Psychiatric:        Mood and Affect: Mood normal.        Behavior: Behavior normal.        Thought Content: Thought content normal.   Assessment & Plan:  1: Chronic heart failure with reduced ejection fraction- - NYHA class II - euvolemic today - weighing daily; reminded to call for an overnight weight gain of > 2 pounds or a weekly weight gain of > 5 pounds - weight down 6 pounds from last visit here 1 month ago - not adding salt and has been reading food labels for sodium content - saw cardiology Stacey [name]) 09/15/20 - on GDMT of carvedilol, farxiga and entresto - since she's been out of clonidine for 5 days, will defer adding spironolactone to next visit - will check labs today since [place] added at last visit - receiving entresto through [employer] patient assistance program - had patient fill out patient assistance for farxiga - participating in paramedicine program - BNP 07/18/20 was 68.3  2: HTN with CKD- - BP mildly elevated (150/72) but she's been without her clonidine for 5 days and she says that she was rushing to get here because she was going to be late - had virtual visit with PCP (Stacey [name]) 08/26/20 - BMP 12/09/20 reviewed and showed sodium 138, potassium 4.3, creatinine 1.31 and GFR 43   Medication bottles reviewed.   Return in 1 month or sooner for any  questions/problems before then

## 2021-01-11 ENCOUNTER — Ambulatory Visit (HOSPITAL_BASED_OUTPATIENT_CLINIC_OR_DEPARTMENT_OTHER): Payer: Medicare Other | Admitting: Family

## 2021-01-11 ENCOUNTER — Other Ambulatory Visit: Payer: Self-pay

## 2021-01-11 ENCOUNTER — Encounter: Payer: Self-pay | Admitting: Family

## 2021-01-11 ENCOUNTER — Other Ambulatory Visit
Admission: RE | Admit: 2021-01-11 | Discharge: 2021-01-11 | Disposition: A | Discharge status: Home / Self Care | Payer: Medicare Other | Source: Ambulatory Visit | Attending: Family | Admitting: Family

## 2021-01-11 VITALS — BP 150/72 | HR 85 | Resp 18 | Ht <= 58 in | Wt 137.1 lb

## 2021-01-11 DIAGNOSIS — Z7951 Long term (current) use of inhaled steroids: Secondary | ICD-10-CM | POA: Insufficient documentation

## 2021-01-11 DIAGNOSIS — I10 Essential (primary) hypertension: Secondary | ICD-10-CM | POA: Diagnosis not present

## 2021-01-11 DIAGNOSIS — I5022 Chronic systolic (congestive) heart failure: Secondary | ICD-10-CM | POA: Insufficient documentation

## 2021-01-11 DIAGNOSIS — Z91138 Patient's unintentional underdosing of medication regimen for other reason: Secondary | ICD-10-CM | POA: Insufficient documentation

## 2021-01-11 DIAGNOSIS — E079 Disorder of thyroid, unspecified: Secondary | ICD-10-CM | POA: Insufficient documentation

## 2021-01-11 DIAGNOSIS — Z79899 Other long term (current) drug therapy: Secondary | ICD-10-CM | POA: Insufficient documentation

## 2021-01-11 DIAGNOSIS — Z885 Allergy status to narcotic agent status: Secondary | ICD-10-CM | POA: Insufficient documentation

## 2021-01-11 DIAGNOSIS — N189 Chronic kidney disease, unspecified: Secondary | ICD-10-CM | POA: Insufficient documentation

## 2021-01-11 DIAGNOSIS — M79641 Pain in right hand: Secondary | ICD-10-CM | POA: Insufficient documentation

## 2021-01-11 DIAGNOSIS — I130 Hypertensive heart and chronic kidney disease with heart failure and stage 1 through stage 4 chronic kidney disease, or unspecified chronic kidney disease: Secondary | ICD-10-CM | POA: Insufficient documentation

## 2021-01-11 DIAGNOSIS — Z888 Allergy status to other drugs, medicaments and biological substances status: Secondary | ICD-10-CM | POA: Insufficient documentation

## 2021-01-11 DIAGNOSIS — R0602 Shortness of breath: Secondary | ICD-10-CM | POA: Insufficient documentation

## 2021-01-11 DIAGNOSIS — M79642 Pain in left hand: Secondary | ICD-10-CM | POA: Insufficient documentation

## 2021-01-11 DIAGNOSIS — J449 Chronic obstructive pulmonary disease, unspecified: Secondary | ICD-10-CM | POA: Insufficient documentation

## 2021-01-11 DIAGNOSIS — Z7984 Long term (current) use of oral hypoglycemic drugs: Secondary | ICD-10-CM | POA: Insufficient documentation

## 2021-01-11 DIAGNOSIS — Z881 Allergy status to other antibiotic agents status: Secondary | ICD-10-CM | POA: Insufficient documentation

## 2021-01-11 DIAGNOSIS — Z886 Allergy status to analgesic agent status: Secondary | ICD-10-CM | POA: Insufficient documentation

## 2021-01-11 DIAGNOSIS — Z79890 Hormone replacement therapy (postmenopausal): Secondary | ICD-10-CM | POA: Insufficient documentation

## 2021-01-11 DIAGNOSIS — T465X6A Underdosing of other antihypertensive drugs, initial encounter: Secondary | ICD-10-CM | POA: Insufficient documentation

## 2021-01-11 LAB — BASIC METABOLIC PANEL
Anion gap: 9 (ref 5–15)
BUN: 27 mg/dL — ABNORMAL HIGH (ref 8–23)
CO2: 26 mmol/L (ref 22–32)
Calcium: 11.2 mg/dL — ABNORMAL HIGH (ref 8.9–10.3)
Chloride: 106 mmol/L (ref 98–111)
Creatinine, Ser: 1.62 mg/dL — ABNORMAL HIGH (ref 0.44–1.00)
GFR, Estimated: 33 mL/min — ABNORMAL LOW (ref >60)
Glucose, Bld: 193 mg/dL — ABNORMAL HIGH (ref 70–99)
Potassium: 3.7 mmol/L (ref 3.5–5.1)
Sodium: 141 mmol/L (ref 135–145)

## 2021-01-11 NOTE — Patient Instructions (Signed)
Continue weighing daily and call for an overnight weight gain of > 2 pounds or a weekly weight gain of >5 pounds. 

## 2021-01-12 ENCOUNTER — Telehealth: Payer: Self-pay

## 2021-01-12 DIAGNOSIS — I340 Nonrheumatic mitral (valve) insufficiency: Secondary | ICD-10-CM | POA: Diagnosis not present

## 2021-01-12 DIAGNOSIS — I6523 Occlusion and stenosis of bilateral carotid arteries: Secondary | ICD-10-CM | POA: Diagnosis not present

## 2021-01-12 DIAGNOSIS — I119 Hypertensive heart disease without heart failure: Secondary | ICD-10-CM | POA: Diagnosis not present

## 2021-01-12 DIAGNOSIS — D8685 Sarcoid myocarditis: Secondary | ICD-10-CM | POA: Diagnosis not present

## 2021-01-12 DIAGNOSIS — I10 Essential (primary) hypertension: Secondary | ICD-10-CM | POA: Diagnosis not present

## 2021-01-12 DIAGNOSIS — E782 Mixed hyperlipidemia: Secondary | ICD-10-CM | POA: Diagnosis not present

## 2021-01-12 DIAGNOSIS — I420 Dilated cardiomyopathy: Secondary | ICD-10-CM | POA: Diagnosis not present

## 2021-01-12 DIAGNOSIS — I5021 Acute systolic (congestive) heart failure: Secondary | ICD-10-CM | POA: Diagnosis not present

## 2021-01-12 NOTE — Telephone Encounter (Addendum)
Attempted to reach patient 3 times to review lab results. Left voicemail for patient to call to discuss lab work and informed to continue all medications.  Georg Ruddle, RN ----- Message from Alisa Graff, [place] sent at 01/11/2021  5:20 PM EDT ----- Kidney function a little worse since farxiga was started but within her range. Will recheck it again at her next visit. Continue all medications.

## 2021-01-17 DIAGNOSIS — I10 Essential (primary) hypertension: Secondary | ICD-10-CM | POA: Diagnosis not present

## 2021-01-17 DIAGNOSIS — E782 Mixed hyperlipidemia: Secondary | ICD-10-CM | POA: Diagnosis not present

## 2021-01-19 ENCOUNTER — Telehealth: Payer: Self-pay

## 2021-01-19 NOTE — Telephone Encounter (Signed)
Patient notified that entresto medication enrollment is expiring in December and asked if she can come the clinic at her convenience in the next two weeks to fill out a new form for 2023 resubmission. Pt acknowledged and stated she will come by next week.  Georg Ruddle, RN [facility]

## 2021-01-25 ENCOUNTER — Ambulatory Visit (INDEPENDENT_AMBULATORY_CARE_PROVIDER_SITE_OTHER): Payer: Medicare Other | Admitting: Physician Assistant

## 2021-01-25 ENCOUNTER — Other Ambulatory Visit: Payer: Self-pay

## 2021-01-25 ENCOUNTER — Encounter: Payer: Self-pay | Admitting: Physician Assistant

## 2021-01-25 VITALS — BP 112/67 | HR 65 | Ht <= 58 in | Wt 141.0 lb

## 2021-01-25 DIAGNOSIS — R339 Retention of urine, unspecified: Secondary | ICD-10-CM | POA: Diagnosis not present

## 2021-01-25 LAB — BLADDER SCAN AMB NON-IMAGING

## 2021-01-25 NOTE — Progress Notes (Signed)
01/25/2021 12:57 PM   Cindy Hazy Dec 09, 1945 [id number]  CC: Chief Complaint  Patient presents with   Urinary Retention   HPI: SILENA WYSS is a 75 y.o. female with PMH urinary retention and UTI associated with multiple heart failure hospitalizations who presents today for symptom recheck and PVR.   Today she reports she has been doing well in terms of her health since she was last seen by Dr. Diamantina Providence 6 months ago.  She reports no bothersome urinary symptoms, but admits she does have some increased frequency after she takes her Lasix.  She denies any dysuria or gross hematuria since she was last seen.  PVR 5 mL.  PMH: Past Medical History:  Diagnosis Date   CHF (congestive heart failure) ([place])    Chronic kidney disease    COPD (chronic obstructive pulmonary disease) ([place])    Hypertension    Sarcoidosis    Thyroid disease     Surgical History: Past Surgical History:  Procedure Laterality Date   CATARACT EXTRACTION     EYE SURGERY Right 07/09/2019   GALLBLADDER SURGERY     KNEE SURGERY     thyroidectomy      Home Medications:  Allergies as of 01/25/2021       Reactions   Codeine Other (See Comments)   Other reaction(s): GI Intolerance Other Reaction: nausea   chest pain Nausea Other reaction(s): GI Intolerance, Other (See Comments) Nausea GI Intolerance, nausea, chest pain   Propoxyphene Other (See Comments)   Other Reaction: nausea and chest pain   Alendronate    Other reaction(s): Other (See Comments) Chest pain   Ciprofloxacin Rash   Lisinopril    Other reaction(s): Cough   Amlodipine Swelling   Aspirin    Other reaction(s): Other (See Comments) Other reaction(s): Other (See Comments) Stomach hurt   Methotrexate Hives   Tramadol Nausea And Vomiting   Vicodin [hydrocodone-acetaminophen] Nausea And Vomiting        Medication List        Accurate as of January 25, 2021 12:57 PM. If you have any questions, ask your nurse or doctor.           acetaminophen 325 MG tablet Commonly known as: TYLENOL Take 650 mg by mouth every 6 (six) hours as needed.   albuterol 108 (90 Base) MCG/ACT inhaler Commonly known as: VENTOLIN HFA Inhale 2 puffs into the lungs every 6 (six) hours as needed for wheezing or shortness of breath.   carvedilol 6.25 MG tablet Commonly known as: COREG Take 1 tablet (6.25 mg total) by mouth 2 (two) times daily with a meal.   cetirizine 10 MG tablet Commonly known as: ZYRTEC Take 1 tablet (10 mg total) by mouth daily.   cloNIDine 0.1 MG tablet Commonly known as: CATAPRES Take 1 tablet (0.1 mg total) by mouth 2 (two) times daily.   cyclobenzaprine 10 MG tablet Commonly known as: FLEXERIL Take 1 tablet (10 mg total) by mouth daily as needed for muscle spasms.   dapagliflozin propanediol 10 MG Tabs tablet Commonly known as: Farxiga Take 1 tablet (10 mg total) by mouth daily before breakfast.   fluticasone 110 MCG/ACT inhaler Commonly known as: FLOVENT HFA Inhale 1 puff into the lungs 2 (two) times daily.   fluticasone 50 MCG/ACT nasal spray Commonly known as: FLONASE Place 2 sprays into both nostrils daily.   furosemide 20 MG tablet Commonly known as: LASIX Take 2 tablets (40 mg total) by mouth daily.   gabapentin 100  MG capsule Commonly known as: NEURONTIN TAKE 2 CAPSULES(200 MG) BY MOUTH AT BEDTIME   hydrALAZINE 50 MG tablet Commonly known as: APRESOLINE TAKE 2 TABLETS(100 MG) BY MOUTH THREE TIMES DAILY   latanoprost 0.005 % ophthalmic solution Commonly known as: XALATAN Place 1 drop into both eyes at bedtime.   levothyroxine 88 MCG tablet Commonly known as: SYNTHROID Take 88 mcg by mouth daily before breakfast.   MELATONIN CR PO Take by mouth.   montelukast 10 MG tablet Commonly known as: SINGULAIR Take 1 tablet (10 mg total) by mouth daily.   Multivitamin Gummies [employer] 2 tablets by mouth daily.   ondansetron 4 MG disintegrating tablet Commonly known as:  Zofran ODT Take 1 tablet (4 mg total) by mouth every 8 (eight) hours as needed.   pantoprazole 40 MG tablet Commonly known as: PROTONIX TAKE 1 TABLET BY MOUTH AS NEEDED What changed: when to take this   pravastatin 20 MG tablet Commonly known as: PRAVACHOL Take 1 tablet (20 mg total) by mouth at bedtime.   sacubitril-valsartan 24-26 MG Commonly known as: ENTRESTO Take 1 tablet by mouth 2 (two) times daily.   timolol 0.25 % ophthalmic solution Commonly known as: TIMOPTIC Place 1 drop into both eyes 2 (two) times daily.        Allergies:  Allergies  Allergen Reactions   Codeine Other (See Comments)    Other reaction(s): GI Intolerance Other Reaction: nausea   chest pain Nausea  Other reaction(s): GI Intolerance, Other (See Comments) Nausea GI Intolerance, nausea, chest pain    Propoxyphene Other (See Comments)    Other Reaction: nausea and chest pain   Alendronate     Other reaction(s): Other (See Comments) Chest pain   Ciprofloxacin Rash   Lisinopril     Other reaction(s): Cough   Amlodipine Swelling   Aspirin     Other reaction(s): Other (See Comments) Other reaction(s): Other (See Comments) Stomach hurt   Methotrexate Hives   Tramadol Nausea And Vomiting   Vicodin [Hydrocodone-Acetaminophen] Nausea And Vomiting    Family History: Family History  Problem Relation Age of Onset   Hypertension Mother    Diabetes Mother    Anuerysm Mother    Diabetes Father    Hypertension Father    Prostate cancer Neg Hx    Bladder Cancer Neg Hx    Kidney cancer Neg Hx     Social History:   reports that she has never smoked. She has never used smokeless tobacco. She reports that she does not currently use drugs. She reports that she does not drink alcohol.  Physical Exam: BP 112/67   Pulse 65   Ht 4\' 10"  (1.473 m)   Wt 141 lb (64 kg)   BMI 29.47 kg/m   Constitutional:  Alert and oriented, no acute distress, nontoxic appearing HEENT: [place], AT Cardiovascular: No  clubbing, cyanosis, or edema Respiratory: Normal respiratory effort, no increased work of breathing Skin: No rashes, bruises or suspicious lesions Neurologic: Grossly intact, no focal deficits, moving all 4 extremities Psychiatric: Normal mood and affect  Laboratory Data: Results for orders placed or performed in visit on 01/25/21  Bladder Scan (Post Void Residual) in office  Result Value Ref Range   Scan Result 49mL    Assessment & Plan:   1. Urinary retention Patient continues to empty well with no bothersome residual urinary symptoms, also no evidence of UTI.  No further intervention indicated.  Okay to follow-up as needed. - Bladder Scan (Post Void  Residual) in office  Return if symptoms worsen or fail to improve.  Debroah Loop, PA-C  [facility] [address] [place], [place] [address] [phone]

## 2021-01-27 ENCOUNTER — Ambulatory Visit
Admission: RE | Admit: 2021-01-27 | Discharge: 2021-01-27 | Disposition: A | Discharge status: Home / Self Care | Payer: Medicare Other | Source: Ambulatory Visit | Attending: Nurse Practitioner | Admitting: Nurse Practitioner

## 2021-01-27 ENCOUNTER — Other Ambulatory Visit: Payer: Self-pay

## 2021-01-27 DIAGNOSIS — Z1231 Encounter for screening mammogram for malignant neoplasm of breast: Secondary | ICD-10-CM | POA: Diagnosis not present

## 2021-01-28 ENCOUNTER — Other Ambulatory Visit: Payer: Self-pay | Admitting: Nurse Practitioner

## 2021-01-28 DIAGNOSIS — N6489 Other specified disorders of breast: Secondary | ICD-10-CM

## 2021-01-28 DIAGNOSIS — R928 Other abnormal and inconclusive findings on diagnostic imaging of breast: Secondary | ICD-10-CM

## 2021-02-02 ENCOUNTER — Telehealth: Payer: Self-pay | Admitting: Family

## 2021-02-02 NOTE — Telephone Encounter (Signed)
LVM in attempt to notify patient that we need a copy of her w2 for proof of income for [employer] patient assistance for entresto as soon as possible.    Aubrey Voong, NT

## 2021-02-14 ENCOUNTER — Ambulatory Visit: Payer: Medicare Other | Admitting: Nurse Practitioner

## 2021-02-15 ENCOUNTER — Ambulatory Visit (HOSPITAL_COMMUNITY): Payer: Medicare Other

## 2021-02-15 NOTE — Telephone Encounter (Signed)
Contacted several times, no answer, left messages.  Will continue to try.   [place] [phone]

## 2021-02-16 ENCOUNTER — Ambulatory Visit
Admission: RE | Admit: 2021-02-16 | Discharge: 2021-02-16 | Disposition: A | Discharge status: Home / Self Care | Payer: Medicare Other | Source: Ambulatory Visit | Attending: Nurse Practitioner | Admitting: Nurse Practitioner

## 2021-02-16 ENCOUNTER — Telehealth (HOSPITAL_COMMUNITY): Payer: Self-pay

## 2021-02-16 ENCOUNTER — Other Ambulatory Visit: Payer: Self-pay

## 2021-02-16 DIAGNOSIS — R928 Other abnormal and inconclusive findings on diagnostic imaging of breast: Secondary | ICD-10-CM | POA: Insufficient documentation

## 2021-02-16 DIAGNOSIS — N6489 Other specified disorders of breast: Secondary | ICD-10-CM | POA: Diagnosis not present

## 2021-02-16 DIAGNOSIS — R922 Inconclusive mammogram: Secondary | ICD-10-CM | POA: Diagnosis not present

## 2021-02-16 NOTE — Telephone Encounter (Signed)
Had been tryng several times during this month to reach [place] and been unable to get her.  Today contacted her husbands phone and was able to talk to her.  She had lost her phone.  She is doing great she said.  She has been on several trip and planning another one.  She has celebrated a birthday, and anniversary and her husbands birthday.  She states she has all her medications and aware of how to take them.  She has been going to her appts and aware of up coming appts.  She has HF appt tomorrow and planning on going.  She denies having any problems such as chest pain, headaches, dizziness or increased shortness of breath.  Will visit next week for in home visit.  Will visit fro heart failure, diet and medication compliance.   [place] [phone]

## 2021-02-16 NOTE — Progress Notes (Signed)
Patient ID: Stacey [name], female    DOB: 1945-09-03, 75 y.o.   MRN: [id number]  HPI  [name] is a 75 y/o female with a history of HTN, CKD, thyroid disease, COPD and chronic heart failure.   Echo report from 07/12/20 reviewed and showed an EF of 20-25% along with mild MR/AR/ AS.   Has not been admitted or been in the ED in the last 6 months.   She presents today for a follow-up visit with a chief complaint of minimal fatigue upon moderate exertion. She describes this as chronic in nature having been present for several years. She has associated cough, shortness of breath, chronic pain and slight weight gain along with this. She denies any difficulty sleeping, dizziness, abdominal distention, palpitations, pedal edema or chest pain.   Said that she's recently had a cousin pass away and she was one of the people that got up and said a few words which was stressful for her.   She says that she has an extremely dry mouth and feels like it's one of her medications.   Past Medical History:  Diagnosis Date   CHF (congestive heart failure) (HCC)    Chronic kidney disease    COPD (chronic obstructive pulmonary disease) ([place])    Hypertension    Sarcoidosis    Thyroid disease    Past Surgical History:  Procedure Laterality Date   CATARACT EXTRACTION     EYE SURGERY Right 07/09/2019   GALLBLADDER SURGERY     KNEE SURGERY     thyroidectomy     Family History  Problem Relation Age of Onset   Hypertension Mother    Diabetes Mother    Anuerysm Mother    Diabetes Father    Hypertension Father    Prostate cancer Neg Hx    Bladder Cancer Neg Hx    Kidney cancer Neg Hx    Breast cancer Neg Hx    Social History   Tobacco Use   Smoking status: Never   Smokeless tobacco: Never  Substance Use Topics   Alcohol use: No   Allergies  Allergen Reactions   Codeine Other (See Comments)    Other reaction(s): GI Intolerance Other Reaction: nausea   chest pain Nausea  Other  reaction(s): GI Intolerance, Other (See Comments) Nausea GI Intolerance, nausea, chest pain    Propoxyphene Other (See Comments)    Other Reaction: nausea and chest pain   Alendronate     Other reaction(s): Other (See Comments) Chest pain   Ciprofloxacin Rash   Lisinopril     Other reaction(s): Cough   Amlodipine Swelling   Aspirin     Other reaction(s): Other (See Comments) Other reaction(s): Other (See Comments) Stomach hurt   Methotrexate Hives   Tramadol Nausea And Vomiting   Vicodin [Hydrocodone-Acetaminophen] Nausea And Vomiting   Prior to Admission medications   Medication Sig Start Date End Date Taking? Authorizing Provider  acetaminophen (TYLENOL) 325 MG tablet Take 650 mg by mouth every 6 (six) hours as needed.   Yes [provider]  carvedilol (COREG) 6.25 MG tablet Take 1 tablet (6.25 mg total) by mouth 2 (two) times daily with a meal. 07/15/20  Yes Jennye Boroughs, MD  cetirizine (ZYRTEC) 10 MG tablet Take 1 tablet (10 mg total) by mouth daily. 08/23/20  Yes McDonough, Lauren K, PA-C  cloNIDine (CATAPRES) 0.1 MG tablet Take 1 tablet (0.1 mg total) by mouth 2 (two) times daily. 09/27/20  Yes [name], MD  cyclobenzaprine (FLEXERIL) 10 MG tablet Take 1 tablet (10 mg total) by mouth daily as needed for muscle spasms. 11/15/20  Yes Abernathy, Yetta Flock, NP  dapagliflozin propanediol (FARXIGA) 10 MG TABS tablet Take 1 tablet (10 mg total) by mouth daily before breakfast. 12/10/20  Yes Harvis Mabus A, FNP  fluticasone (FLONASE) 50 MCG/ACT nasal spray Place 2 sprays into both nostrils daily. 03/23/20  Yes Luiz Ochoa, NP  furosemide (LASIX) 20 MG tablet Take 2 tablets (40 mg total) by mouth daily. 07/15/20  Yes Jennye Boroughs, MD  gabapentin (NEURONTIN) 100 MG capsule TAKE 2 CAPSULES(200 MG) BY MOUTH AT BEDTIME 12/31/20  Yes McDonough, Lauren K, PA-C  hydrALAZINE (APRESOLINE) 50 MG tablet TAKE 2 TABLETS(100 MG) BY MOUTH THREE TIMES DAILY 11/19/20  Yes [name], MD   latanoprost (XALATAN) 0.005 % ophthalmic solution Place 1 drop into both eyes at bedtime.  01/16/14  Yes [provider]  levothyroxine (SYNTHROID, LEVOTHROID) 88 MCG tablet Take 88 mcg by mouth daily before breakfast.  03/11/17  Yes [provider]  MELATONIN CR PO Take by mouth.   Yes [provider]  montelukast (SINGULAIR) 10 MG tablet Take 1 tablet (10 mg total) by mouth daily. 08/23/20  Yes McDonough, Si Gaul, PA-C  Multiple Vitamins-Minerals (MULTIVITAMIN GUMMIES WOMENS) CHEW Chew 2 tablets by mouth daily.   Yes [provider]  ondansetron (ZOFRAN ODT) 4 MG disintegrating tablet Take 1 tablet (4 mg total) by mouth every 8 (eight) hours as needed. 06/25/20  Yes Menshew, Dannielle Karvonen, PA-C  pantoprazole (PROTONIX) 40 MG tablet TAKE 1 TABLET BY MOUTH AS NEEDED Patient taking differently: Take 40 mg by mouth daily. 04/18/19  Yes Boscia, Greer Ee, NP  pravastatin (PRAVACHOL) 20 MG tablet Take 1 tablet (20 mg total) by mouth at bedtime. 11/15/20  Yes Abernathy, Alyssa, NP  sacubitril-valsartan (ENTRESTO) 24-26 MG Take 1 tablet by mouth 2 (two) times daily. 09/29/20  Yes Hancel Ion, Otila Kluver A, FNP  timolol (TIMOPTIC) 0.25 % ophthalmic solution Place 1 drop into both eyes 2 (two) times daily.  07/30/09  Yes [provider]  albuterol (VENTOLIN HFA) 108 (90 Base) MCG/ACT inhaler Inhale 2 puffs into the lungs every 6 (six) hours as needed for wheezing or shortness of breath. Patient not taking: Reported on 02/16/2021 09/27/20   [name], MD  fluticasone [facility] HFA) 110 MCG/ACT inhaler Inhale 1 puff into the lungs 2 (two) times daily. Patient not taking: Reported on 02/17/2021 09/27/20   [name], MD    Review of Systems  Constitutional:  Positive for fatigue (minimal). Negative for appetite change.  HENT:  Positive for rhinorrhea. Negative for congestion, postnasal drip and sore throat.   Eyes: Negative.   Respiratory:  Positive for cough (dry) and  shortness of breath. Negative for chest tightness.   Cardiovascular:  Negative for chest pain, palpitations and leg swelling.  Gastrointestinal:  Negative for abdominal distention and abdominal pain.  Endocrine: Negative.   Genitourinary: Negative.   Musculoskeletal:  Positive for arthralgias (hands/ thumb) and back pain.  Skin: Negative.   Allergic/Immunologic: Negative.   Neurological:  Negative for dizziness and light-headedness.  Hematological:  Negative for adenopathy. Does not bruise/bleed easily.  Psychiatric/Behavioral:  Negative for dysphoric mood and sleep disturbance (sleeping on 2 pillows; taking melatonin). The patient is not nervous/anxious.    Vitals:   02/17/21 1100  BP: (!) 133/44  Pulse: (!) 56  Resp: 18  SpO2: 98%  Weight: 143 lb (64.9 kg)  Height: 4\' 9"  (1.448 m)   Wt Readings from Last 3 Encounters:  02/17/21 143 lb (64.9 kg)  01/25/21 141 lb (64 kg)  01/11/21 137 lb 2 oz (62.2 kg)   Lab Results  Component Value Date   CREATININE 1.62 (H) 01/11/2021   CREATININE 1.31 (H) 12/09/2020   CREATININE 1.32 (H) 09/28/2020    Physical Exam Vitals and nursing note reviewed. Exam conducted with a chaperone present.  Constitutional:      Appearance: Normal appearance.  HENT:     Head: Normocephalic and atraumatic.     Right Ear: Decreased hearing noted.     Left Ear: Decreased hearing noted.  Cardiovascular:     Rate and Rhythm: Normal rate and regular rhythm.  Pulmonary:     Effort: Pulmonary effort is normal. No respiratory distress.     Breath sounds: No wheezing or rales.  Abdominal:     General: There is no distension.     Palpations: Abdomen is soft.     Tenderness: There is no abdominal tenderness.  Musculoskeletal:        General: No tenderness.     Cervical back: Normal range of motion and neck supple.     Right lower leg: No edema.     Left lower leg: No edema.  Skin:    General: Skin is warm and dry.  Neurological:     General: No focal  deficit present.     Mental Status: She is alert and oriented to person, place, and time.  Psychiatric:        Mood and Affect: Mood normal.        Behavior: Behavior normal.        Thought Content: Thought content normal.   Assessment & Plan:  1: Chronic heart failure with reduced ejection fraction- - NYHA class II - euvolemic today - weighing daily; reminded to call for an overnight weight gain of > 2 pounds or a weekly weight gain of > 5 pounds - weight up 6 pounds from last visit here 1 month ago - not adding salt and has been reading food labels for sodium content - will decrease her furosemide to 1 tablet daily (due to dry mouth); instructed to take an additional tablet if needed for above weight gain or swelling - clonidine can also cause dry mouth but she doesn't feel like it's that medication - saw cardiology Petra Kuba) 01/12/21 - on GDMT of carvedilol, farxiga and entresto - receiving entresto through [employer] patient assistance program - discussed adding spironolactone and the need for lab work in 1 week, 1 month & then 6 months; patient would prefer to hold off starting this at this time - participating in paramedicine program - BNP 07/18/20 was 68.3  2: HTN with CKD- - BP looks good (133/44) - had virtual visit with PCP (McDonough) 08/26/20; returns 03/02/21 - BMP 01/11/21 reviewed and showed sodium 141, potassium 3.7, creatinine 1.62 and GFR 33   Patient did not bring her medications nor a list. Each medication was verbally reviewed with the patient and she was encouraged to bring the bottles to every visit to confirm accuracy of list.   Return in 1 month or sooner for any questions/problems before then. Will check labs at that time if not done at PCP's office.

## 2021-02-17 ENCOUNTER — Ambulatory Visit: Payer: Medicare Other | Attending: Family | Admitting: Family

## 2021-02-17 ENCOUNTER — Other Ambulatory Visit: Payer: Self-pay | Admitting: Internal Medicine

## 2021-02-17 ENCOUNTER — Encounter: Payer: Self-pay | Admitting: Family

## 2021-02-17 VITALS — BP 133/44 | HR 56 | Resp 18 | Ht <= 58 in | Wt 143.0 lb

## 2021-02-17 DIAGNOSIS — N189 Chronic kidney disease, unspecified: Secondary | ICD-10-CM | POA: Diagnosis not present

## 2021-02-17 DIAGNOSIS — I10 Essential (primary) hypertension: Secondary | ICD-10-CM

## 2021-02-17 DIAGNOSIS — Z7984 Long term (current) use of oral hypoglycemic drugs: Secondary | ICD-10-CM | POA: Diagnosis not present

## 2021-02-17 DIAGNOSIS — J449 Chronic obstructive pulmonary disease, unspecified: Secondary | ICD-10-CM | POA: Diagnosis not present

## 2021-02-17 DIAGNOSIS — I130 Hypertensive heart and chronic kidney disease with heart failure and stage 1 through stage 4 chronic kidney disease, or unspecified chronic kidney disease: Secondary | ICD-10-CM | POA: Insufficient documentation

## 2021-02-17 DIAGNOSIS — R682 Dry mouth, unspecified: Secondary | ICD-10-CM | POA: Diagnosis not present

## 2021-02-17 DIAGNOSIS — M549 Dorsalgia, unspecified: Secondary | ICD-10-CM | POA: Insufficient documentation

## 2021-02-17 DIAGNOSIS — J3489 Other specified disorders of nose and nasal sinuses: Secondary | ICD-10-CM | POA: Insufficient documentation

## 2021-02-17 DIAGNOSIS — I5020 Unspecified systolic (congestive) heart failure: Secondary | ICD-10-CM | POA: Insufficient documentation

## 2021-02-17 DIAGNOSIS — I5022 Chronic systolic (congestive) heart failure: Secondary | ICD-10-CM | POA: Diagnosis not present

## 2021-02-17 DIAGNOSIS — G8929 Other chronic pain: Secondary | ICD-10-CM | POA: Insufficient documentation

## 2021-02-17 DIAGNOSIS — E079 Disorder of thyroid, unspecified: Secondary | ICD-10-CM | POA: Diagnosis not present

## 2021-02-17 DIAGNOSIS — Z634 Disappearance and death of family member: Secondary | ICD-10-CM | POA: Insufficient documentation

## 2021-02-17 DIAGNOSIS — M25549 Pain in joints of unspecified hand: Secondary | ICD-10-CM | POA: Diagnosis not present

## 2021-02-17 DIAGNOSIS — Z79899 Other long term (current) drug therapy: Secondary | ICD-10-CM | POA: Insufficient documentation

## 2021-02-17 NOTE — Patient Instructions (Addendum)
Continue weighing daily and call for an overnight weight gain of 3 pounds or more or a weekly weight gain of more than 5 pounds.    Decrease your fluid pill to 1 tablet daily. Take an additional tablet for above weight gain or any swelling.

## 2021-03-02 ENCOUNTER — Ambulatory Visit (INDEPENDENT_AMBULATORY_CARE_PROVIDER_SITE_OTHER): Payer: Medicare Other | Admitting: Nurse Practitioner

## 2021-03-02 ENCOUNTER — Encounter: Payer: Self-pay | Admitting: Nurse Practitioner

## 2021-03-02 ENCOUNTER — Other Ambulatory Visit: Payer: Self-pay

## 2021-03-02 VITALS — BP 136/68 | HR 69 | Temp 98.2°F | Resp 16 | Ht <= 58 in | Wt 142.2 lb

## 2021-03-02 DIAGNOSIS — I10 Essential (primary) hypertension: Secondary | ICD-10-CM

## 2021-03-02 DIAGNOSIS — Z23 Encounter for immunization: Secondary | ICD-10-CM

## 2021-03-02 DIAGNOSIS — E782 Mixed hyperlipidemia: Secondary | ICD-10-CM

## 2021-03-02 DIAGNOSIS — M5441 Lumbago with sciatica, right side: Secondary | ICD-10-CM

## 2021-03-02 DIAGNOSIS — G8929 Other chronic pain: Secondary | ICD-10-CM

## 2021-03-02 MED ORDER — ZOSTER VAC RECOMB ADJUVANTED 50 MCG/0.5ML IM SUSR: 0.5000 mL | Freq: Once | INTRAMUSCULAR | 0 refills | Status: AC

## 2021-03-02 MED ORDER — CLONIDINE HCL 0.1 MG PO TABS: 0.1000 mg | ORAL_TABLET | Freq: Two times a day (BID) | ORAL | 4 refills | Status: AC

## 2021-03-02 NOTE — Progress Notes (Signed)
[facility] [place], [place] [address]  Internal MEDICINE  Office Visit Note  Patient Name: Stacey [name]  [id number]  [id number]  Date of Service: 03/02/2021  Chief Complaint  Patient presents with   Follow-up    Wants physical therapy for back sciatic nerve pain on right side, discuss numbness in fingers on left hand and stiffness   Hypertension   COPD    HPI [name] presents for a follow-up visit for hypertension.  She also reports having low back pain with right-sided sciatica.  She is also having left hand numbness and stiffness especially in the fingers.  She is requesting a referral to physical therapy again for the back pain.  She has had release surgery previously and takes naproxen as needed for the back pain and stiffness in her hands.   Her blood pressure is well controlled and she needs a refill on her clonidine.     Current Medication: Outpatient Encounter Medications as of 03/02/2021  Medication Sig Note   acetaminophen (TYLENOL) 325 MG tablet Take 650 mg by mouth every 6 (six) hours as needed.    albuterol (VENTOLIN HFA) 108 (90 Base) MCG/ACT inhaler Inhale into the lungs every 6 (six) hours as needed for wheezing or shortness of breath.    carvedilol (COREG) 6.25 MG tablet Take 1 tablet (6.25 mg total) by mouth 2 (two) times daily with a meal.    cetirizine (ZYRTEC) 10 MG tablet Take 1 tablet (10 mg total) by mouth daily.    cyclobenzaprine (FLEXERIL) 10 MG tablet Take 1 tablet (10 mg total) by mouth daily as needed for muscle spasms.    dapagliflozin propanediol (FARXIGA) 10 MG TABS tablet Take 1 tablet (10 mg total) by mouth daily before breakfast.    fluticasone (FLONASE) 50 MCG/ACT nasal spray Place 2 sprays into both nostrils daily.    fluticasone (FLOVENT HFA) 110 MCG/ACT inhaler Inhale 1 puff into the lungs 2 (two) times daily.    furosemide (LASIX) 20 MG tablet Take 2 tablets (40 mg total) by mouth daily.    gabapentin (NEURONTIN)  100 MG capsule TAKE 2 CAPSULES(200 MG) BY MOUTH AT BEDTIME    hydrALAZINE (APRESOLINE) 50 MG tablet TAKE 2 TABLET BY MOUTH THREE TIMES DAILY    latanoprost (XALATAN) 0.005 % ophthalmic solution Place 1 drop into both eyes at bedtime.     levothyroxine (SYNTHROID, LEVOTHROID) 88 MCG tablet Take 88 mcg by mouth daily before breakfast.     MELATONIN CR PO Take by mouth.    montelukast (SINGULAIR) 10 MG tablet Take 1 tablet (10 mg total) by mouth daily.    Multiple Vitamins-Minerals (MULTIVITAMIN GUMMIES WOMENS) CHEW Chew 2 tablets by mouth daily.    ondansetron (ZOFRAN ODT) 4 MG disintegrating tablet Take 1 tablet (4 mg total) by mouth every 8 (eight) hours as needed.    pantoprazole (PROTONIX) 40 MG tablet TAKE 1 TABLET BY MOUTH AS NEEDED (Patient taking differently: Take 40 mg by mouth daily.)    pravastatin (PRAVACHOL) 20 MG tablet Take 1 tablet (20 mg total) by mouth at bedtime.    sacubitril-valsartan (ENTRESTO) 24-26 MG Take 1 tablet by mouth 2 (two) times daily.    timolol (TIMOPTIC) 0.25 % ophthalmic solution Place 1 drop into both eyes 2 (two) times daily.     [DISCONTINUED] cloNIDine (CATAPRES) 0.1 MG tablet Take 1 tablet (0.1 mg total) by mouth 2 (two) times daily.    [DISCONTINUED] Zoster Vaccine Adjuvanted [facility]) injection Inject 0.5 mLs  into the muscle once.    cloNIDine (CATAPRES) 0.1 MG tablet Take 1 tablet (0.1 mg total) by mouth 2 (two) times daily.    [EXPIRED] Zoster Vaccine Adjuvanted [facility]) injection Inject 0.5 mLs into the muscle once for 1 dose.    [DISCONTINUED] albuterol (VENTOLIN HFA) 108 (90 Base) MCG/ACT inhaler Inhale 2 puffs into the lungs every 6 (six) hours as needed for wheezing or shortness of breath. (Patient not taking: Reported on 02/16/2021) 09/28/2020: Patient hasn't picked up the inhaler from pharmacy yet   No facility-administered encounter medications on file as of 03/02/2021.    Surgical History: Past Surgical History:  Procedure Laterality Date    CATARACT EXTRACTION     EYE SURGERY Right 07/09/2019   GALLBLADDER SURGERY     KNEE SURGERY     thyroidectomy      Medical History: Past Medical History:  Diagnosis Date   CHF (congestive heart failure) (HCC)    Chronic kidney disease    COPD (chronic obstructive pulmonary disease) (HCC)    Hypertension    Sarcoidosis    Thyroid disease     Family History: Family History  Problem Relation Age of Onset   Hypertension Mother    Diabetes Mother    Anuerysm Mother    Diabetes Father    Hypertension Father    Prostate cancer Neg Hx    Bladder Cancer Neg Hx    Kidney cancer Neg Hx    Breast cancer Neg Hx     Social History   Socioeconomic History   Marital status: Married    Spouse name: Not on file   Number of children: Not on file   Years of education: Not on file   Highest education level: Not on file  Occupational History   Not on file  Tobacco Use   Smoking status: Never   Smokeless tobacco: Never  Vaping Use   Vaping Use: Never used  Substance and Sexual Activity   Alcohol use: No   Drug use: Not Currently   Sexual activity: Not on file  Other Topics Concern   Not on file  Social History Narrative   Not on file   Social Determinants of Health   Financial Resource Strain: Low Risk    Difficulty of Paying Living Expenses: Not very hard  Food Insecurity: Not on file  Transportation Needs: Not on file  Physical Activity: Not on file  Stress: Not on file  Social Connections: Not on file  Intimate Partner Violence: Not on file      Review of Systems  Constitutional:  Negative for chills, fatigue and unexpected weight change.  HENT:  Negative for congestion, rhinorrhea, sneezing and sore throat.   Eyes:  Negative for redness.  Respiratory:  Negative for cough, chest tightness and shortness of breath.   Cardiovascular:  Negative for chest pain and palpitations.  Gastrointestinal:  Negative for abdominal pain, constipation, diarrhea, nausea and  vomiting.  Genitourinary:  Negative for dysuria and frequency.  Musculoskeletal:  Negative for arthralgias, back pain, joint swelling and neck pain.  Skin:  Negative for rash.  Neurological: Negative.  Negative for tremors and numbness.  Hematological:  Negative for adenopathy. Does not bruise/bleed easily.  Psychiatric/Behavioral:  Negative for behavioral problems (Depression), sleep disturbance and suicidal ideas. The patient is not nervous/anxious.    Vital Signs: BP 136/68   Pulse 69   Temp 98.2 F (36.8 C)   Resp 16   Ht 4\' 9"  (1.448 m)   Wt  142 lb 3.2 oz (64.5 kg)   SpO2 97%   BMI 30.77 kg/m    Physical Exam Vitals reviewed.  Constitutional:      General: She is not in acute distress.    Appearance: Normal appearance. She is obese. She is not ill-appearing.  HENT:     Head: Normocephalic and atraumatic.  Eyes:     Pupils: Pupils are equal, round, and reactive to light.  Cardiovascular:     Rate and Rhythm: Normal rate and regular rhythm.  Pulmonary:     Effort: Pulmonary effort is normal. No respiratory distress.  Neurological:     Mental Status: She is alert and oriented to person, place, and time.     Cranial Nerves: No cranial nerve deficit.     Coordination: Coordination normal.     Gait: Gait normal.  Psychiatric:        Mood and Affect: Mood normal.        Behavior: Behavior normal.       Assessment/Plan: 1. Chronic bilateral low back pain with right-sided sciatica Referral sent for physical therapy - Ambulatory referral to Physical Therapy  2. Essential hypertension Stable with current medication, refills ordered. Followed by cardiology - cloNIDine (CATAPRES) 0.1 MG tablet; Take 1 tablet (0.1 mg total) by mouth 2 (two) times daily.  Dispense: 180 tablet; Refill: 4  3. Mixed hyperlipidemia Stable, taking pravastatin, refills ordered.  - pravastatin (PRAVACHOL) 20 MG tablet; Take 1 tablet (20 mg total) by mouth at bedtime.  Dispense: 90 tablet;  Refill: 1  4. Need for vaccination - Zoster Vaccine Adjuvanted [facility]) injection; Inject 0.5 mLs into the muscle once for 1 dose.  Dispense: 0.5 mL; Refill: 0   General Counseling: Sequoyah verbalizes understanding of the findings of todays visit and agrees with plan of treatment. I have discussed any further diagnostic evaluation that may be needed or ordered today. We also reviewed her medications today. she has been encouraged to call the office with any questions or concerns that should arise related to todays visit.    Orders Placed This Encounter  Procedures   Ambulatory referral to Physical Therapy    Meds ordered this encounter  Medications   Zoster Vaccine Adjuvanted [facility]) injection    Sig: Inject 0.5 mLs into the muscle once for 1 dose.    Dispense:  0.5 mL    Refill:  0    Patient will check with pharmacy to schedule vaccination   cloNIDine (CATAPRES) 0.1 MG tablet    Sig: Take 1 tablet (0.1 mg total) by mouth 2 (two) times daily.    Dispense:  180 tablet    Refill:  4    Please fill as 90 day prescription.    Return in about 3 months (around 05/31/2021) for F/U, med refill, [place] PCP.   Total time spent:30 Minutes Time spent includes review of chart, medications, test results, and follow up plan with the patient.   [place] Controlled Substance Database was reviewed by me.  This patient was seen by Jonetta Osgood, FNP-C in collaboration with Dr. Clayborn Bigness as a part of collaborative care agreement.   Suyash Amory R. Valetta Fuller, MSN, FNP-C Internal medicine

## 2021-03-03 ENCOUNTER — Encounter: Payer: Self-pay | Admitting: Nurse Practitioner

## 2021-03-03 MED ORDER — PRAVASTATIN SODIUM 20 MG PO TABS: 20.0000 mg | ORAL_TABLET | Freq: Every day | ORAL | 1 refills | Status: DC

## 2021-03-16 ENCOUNTER — Telehealth (HOSPITAL_COMMUNITY): Payer: Self-pay

## 2021-03-16 NOTE — Telephone Encounter (Signed)
Spoke to [place] today, she is doing good.  She had a good Christmas.  She is aware of up coming appts.  She has all her medications and aware of how to take them.  She lives with her husband and has family support close by.  Her husband works and her son takes her to her appts a lot.  She stays busy in the home.  She walks daily when weather is nice.  She denies any problems today such as chest pain, headaches, dizziness or increased shortness of breath.  She has aeverything for daily living.  Scheduled a home visit for next week.  Will visit for heart failure, diet and medication management.   [place] [phone]

## 2021-03-22 ENCOUNTER — Other Ambulatory Visit: Payer: Self-pay | Admitting: Nurse Practitioner

## 2021-03-22 DIAGNOSIS — K219 Gastro-esophageal reflux disease without esophagitis: Secondary | ICD-10-CM

## 2021-03-23 ENCOUNTER — Other Ambulatory Visit (HOSPITAL_COMMUNITY): Payer: Self-pay

## 2021-03-23 ENCOUNTER — Other Ambulatory Visit: Payer: Self-pay | Admitting: Family

## 2021-03-23 DIAGNOSIS — R601 Generalized edema: Secondary | ICD-10-CM

## 2021-03-23 MED ORDER — FUROSEMIDE 20 MG PO TABS: 40.0000 mg | ORAL_TABLET | Freq: Every day | ORAL | 5 refills | Status: DC

## 2021-03-23 NOTE — Progress Notes (Signed)
Sent furosemide RX to [facility]

## 2021-03-24 NOTE — Progress Notes (Signed)
Had a visit with Stacey [name] today.  She states doing goof.  She has did a lot of traveling in past couple of months.  She states she does good.  She is aware to stop and rest.  She states does get tired but a lot better than what it was.  She denies weight gain and edema in legs.  She tries to watch high sodium foods and how much fluids she intakes.  She is doing a fasting diet with her Theodoro Kos right now for the Month of January but she is aware to eat with her medications.  She has all her medications, she was asking if she was approved for her farxiga and [name] for year 2023.  Sent message to HF clinic to find out.  She also advised needs a refill for Furosemide, Otila Kluver with HF clinic has sent new prescription for her to her pharmacy.  She denies any chest pain, headaches, dizziness or increased shortness of breath.  She is aware of up coming appts.  Will continue to visit for heart failure, diet and medication management.   [place] [phone]

## 2021-03-25 ENCOUNTER — Telehealth: Payer: Self-pay | Admitting: Student-PharmD

## 2021-03-25 NOTE — Progress Notes (Addendum)
General Review Call   [facility]  [id number] 34 years, Female  DOB: 09/22/1945  M: [phone]  General Review [facility]) Completed by Charlann Lange on 03/24/2021  Chart Review  What recent interventions/DTPs have been made by any provider to improve the patient's conditions in the last 3 months?:   Consults: 01/11/21 [place], [place], FNP. For Chronic systolic heart failure. STOPPED Clobetasol. PER NOTE: Continue weighing daily and call for an overnight weight gain of > 2 pounds or a weekly weight gain of > 5 pounds.  01/12/21 Caridology Jettie Booze, PA For Hypertension No medication changes.  01/25/21 Urology Vaillancourt, Aldona Bar, PA-C. For Urinary retention. No medication changes.  01/31/21 Ophthalmology Darrin Luis, Karis Juba, MD. For Pseudophakia of both eyes. No medication changes.  02/17/21 Cardiology Alisa Graff, FNP. For Chronic systolic heart failure. No medication changes.  Office Visit: 03/02/21 Jonetta Osgood, NP. For follow-up. CHANGED Albuterol Sulfate 108 MCG/ACT to Inhale into the lungs every 6 (six) hours as needed for wheezing or shortness of breath.  Any recent hospitalizations or ED visits since last visit with CPP?: No  Adherence Review  Adherence rates for STAR metric medications:  Pravastatin 20 mg 02/11/21 90 DS, 11/15/20 90 DS Farxiga 10 mg 01/17/21 30 DS, 12/10/20 90 DS  Adherence rates for medications indicated for disease state being reviewed:  Pravastatin 20 mg 02/11/21 90 DS, 11/15/20 90 DS Farxiga 10 mg 01/17/21 30 DS, 12/10/20 90 DS  Charlann Lange, RMA Clinical Pharmacist Assistant [phone]  Pharmacist Review  Adherence gaps identified?: No Drug Therapy Problems identified?: No Assessment: Controlled  5 minutes spent in review, coordination, and documentation.  Reviewed by: Alena Bills, PharmD Clinical Pharmacist [phone]

## 2021-03-29 ENCOUNTER — Ambulatory Visit: Payer: Medicare Other | Attending: Nurse Practitioner | Admitting: Physical Therapy

## 2021-03-29 ENCOUNTER — Encounter: Payer: Self-pay | Admitting: Physical Therapy

## 2021-03-29 DIAGNOSIS — M545 Low back pain, unspecified: Secondary | ICD-10-CM | POA: Insufficient documentation

## 2021-03-29 DIAGNOSIS — G8929 Other chronic pain: Secondary | ICD-10-CM | POA: Diagnosis not present

## 2021-03-29 DIAGNOSIS — M5441 Lumbago with sciatica, right side: Secondary | ICD-10-CM | POA: Diagnosis not present

## 2021-03-29 NOTE — Therapy (Signed)
[facility] [address] [place], [address] Phone: [phone]   Fax:  [phone]  Physical Therapy Evaluation  Patient Details  Name: Stacey [name] MRN: [id number] Date of Birth: Aug 12, 1945 Referring Provider (PT): Abernathy NP   Encounter Date: 03/29/2021   [facility] PT Assessment - 03/29/21 0001       Assessment   Medical Diagnosis Chronic LBP    Referring Provider (PT) Abernathy NP    Onset Date/Surgical Date 03/29/98    Hand Dominance Right    Next MD Visit none    Prior Therapy yes      Balance Screen   Has the patient fallen in the past 6 months Yes    How many times? 1    Has the patient had a decrease in activity level because of a fear of falling?  No    Is the patient reluctant to leave their home because of a fear of falling?  No               PT End of Session - 03/29/21 1408     Visit Number 1    Number of Visits 17    Date for PT Re-Evaluation 05/27/21    Authorization - Visit Number 1    Authorization - Number of Visits 10    PT Start Time 0945    PT Stop Time 1030    PT Time Calculation (min) 45 min    Activity Tolerance Patient tolerated treatment well    Behavior During Therapy WFL for tasks assessed/performed             Past Medical History:  Diagnosis Date   CHF (congestive heart failure) ([place])    Chronic kidney disease    COPD (chronic obstructive pulmonary disease) ([place])    Hypertension    Sarcoidosis    Thyroid disease     Past Surgical History:  Procedure Laterality Date   CATARACT EXTRACTION     EYE SURGERY Right 07/09/2019   GALLBLADDER SURGERY     KNEE SURGERY     thyroidectomy      There were no vitals filed for this visit.      OBJECTIVE  Mental Status Patient is oriented to person, place and time.  Recent memory is intact.  Remote memory is intact.  Attention span and concentration are intact.  Expressive speech is intact.   Patient's fund of knowledge is within normal limits for educational level.   Gross Musculoskeletal Assessment Tremor: None Bulk: Normal Tone: Normal No visible step-off along spinal column   Gait Compensated trendelenberg (more hip drop on RLE) slow speed   Posture Lumbar lordosis: WNL Lower crossed syndrome (tight hip flexors and erector spinae; weak gluts and abs): positive Stands with increased LLE WB with increased R iliac crest height, able to correct this easily  AROM (degrees) R/L (all movements include overpressure unless otherwise stated) Lumbar forward flexion (65): WNL with patient reporting concordant pain sign Lumbar extension (30): WNL with relief Lumbar lateral flexion (25): WNL bilat with "stretch" sensation at R side of LB with bilat LF (not concordant) Thoracic and Lumbar rotation (30 degrees):  WNL bilat with "stretch" sensation at R side of LB with L rotation (not concordant) Hip IR (0-45): R: 22 L: 28d Hip ER (0-45): R: 21d L: 34d Hip Flexion (0-125): WNL Hip Abduction (0-40): WNL Hip extension (0-15): R: ~5 L: 10 *Indicates pain   PROM (degrees) All  lumbar/thoracic mobility PROM = AROM Hip ER approx 35d bilat Hip IR approx 30d bilat Hip ext R approx 10d L approx 15d  *Indicates pain   Repeated Movements centralization with ext   Strength (out of 5) R/L 5/5 Hip flexion 4/4+ Hip ER 5/5 Hip IR 3/3+ Hip abduction 5/5 Hip adduction 3/3- Hip extension 5 Trunk flexion 5 Trunk extension 5/5 Trunk rotation *Indicates pain All tested within available ROM  Sensation Grossly intact to light touch bilateral LEs as determined by testing dermatomes L2-S2. Proprioception and hot/cold testing deferred on this date.   Palpation Concordant pain sign to palpation of R superior glute fibers (max/min) with increased tension and trigger points. Reports concordant pain to deep palpation of mid glute fibers over piriformis/deep rotators Reports pain  that is not concordant pain to palpation of R lumbar paraspinals and QL, trigger points and increased tension to bilat lumbar paraspinals and QL Latent trigger points to L R superior glute fibers (max/min) and mid glute fibers over piriformis/deep rotators  Muscle Length Hamstrings: R: 20degrees L: WNL [place]: R shortened L slightly shortened  Thomas: shortened bilat Ober: WNL    Passive Accessory Intervertebral Motion (PAIVM) Pt denies reproduction of back pain with CPA L1-L5 and UPA bilaterally L1-L5. Generally hypomobile throughout    SPECIAL TESTS Lumbar Radiculopathy and Discogenic: Centralization and Peripheralization (SN 92, -LR 0.12): Positive Slump (SN 83, -LR 0.32): R: Negative L: Negative SLR (SN 92, -LR 0.29): R: Positive  Crossed SLR (SP 90)L: Positive   Facet Joint: Extension-Rotation (SN 100, -LR 0.0): R: Negative L: Negative   Lumbar Foraminal Stenosis: Lumbar quadrant (SN 70): R: Negative L: Negative   Hip: FABER (SN 81): R: Positive L: Positive FADIR (SN 94): R: Positive L: Negative Hip scour (SN 50): R: Positive L: Positive   SIJ:  Thigh Thrust (SN 88, -LR 0.18) : R: Negative L: Negative   Piriformis Syndrome: FAIR Test (SN 88, SP 83): R: Positive L: Negative   Functional Tasks 5xSTS 18 sec Bed mobility: modI for rolling and supine <> sit with increased time needed, clear discomfort throughout  STS transfer: heavy use of UE, gowers sign Squat heavy thoracic kyphosis, bilat hip and ankle ER, and forward trunk lean Stairs step to ascent and descent with heavy use of handrail 10MWT self selected:0.58m/s fastest: 0.27m/s   Ther-Ex PT reviewed the following HEP with patient with patient able to demonstrate a set of the following with min cuing for correction needed. PT educated patient on parameters of therex (how/when to inc/decrease intensity, frequency, rep/set range, stretch hold time, and purpose of therex) with verbalized understanding.  Access  Code: [email or web address]  Supine Figure 4 Piriformis Stretch - 2 x daily - 7 x weekly - 30-60sec hold Supine Piriformis Stretch - 2 x daily - 7 x weekly - 30-60sec hold Sit to Stand Without Arm Support - 1 x daily - 7 x weekly - 3 sets - 5 reps               Objective measurements completed on examination: See above findings.                PT Education - 03/29/21 1005     Education Details Patient was educated on diagnosis, anatomy and pathology involved, prognosis, role of PT, and was given an HEP, demonstrating exercise with proper form following verbal and tactile cues, and was given a paper hand out to continue exercise at home. Pt was educated on and agreed to plan  of care.    Person(s) Educated Patient    Methods Explanation;Demonstration;Verbal cues;Tactile cues    Comprehension Returned demonstration;Verbal cues required;Tactile cues required;Verbalized understanding              PT Short Term Goals - 03/29/21 1456       PT SHORT TERM GOAL #1   Title Pt will be independent with HEP in order to improve strength and decrease back pain in order to improve pain-free function at home and work.    Baseline 03/29/21 HEP given    Time 4    Period Weeks    Status New               PT Long Term Goals - 03/29/21 1458       PT LONG TERM GOAL #1   Title Pt will decrease worst back pain as reported on NPRS by at least 2 points in order to demonstrate clinically significant reduction in back pain.    Baseline 04/08/21 10/10 LBP with activity    Time 8    Period Weeks    Status New      PT LONG TERM GOAL #2   Title Pt will decrease 5TSTS to at least 12 seconds in order to demonstrate clinically significant improvement in LE strength for age-matched strength norms    Baseline 03/29/21 18sec    Time 8    Period Weeks    Status New      PT LONG TERM GOAL #3   Title Pt will increase 10MWT to at least 1.0 m/s in order to demonstrate community ambulation speed  with less likelihood of an adverse event    Baseline 03/29/21 self selected:0.68m/s fastest: 0.66m/s    Time 8    Period Weeks    Status New      PT LONG TERM GOAL #4   Title Patient will increase FOTO score to 49 to demonstrate predicted increase in functional mobility to complete ADLs    Baseline 03/29/21 40    Time 8    Period Weeks    Status New                    Plan - 03/29/21 1436     Clinical Impression Statement Pt is a 76 year old female presenting with chronic LBP. Examination reveals possible signs and symptoms of R sided lumbar rediculopathy (difficulty discerning true redicular symptoms from patient), with more evidence for R hip dysfunction/piriformis syndrome. Impairments in decreased bilat hip strength R>L, decreased bilat hip mobility R>L, decreased lumbar rotation mobility, decreased activity tolerance and endurance, decreased motor control of functional movement, tension and trigger points at R glute and lumbar musculature (decreased flexibility/increased tone), abnormal gait with decreased speed, and pain. Activity limitations in functional squatting, lifting, functional gait, functional transfers, stair negotiation, prolonged standing, walking, carrying, pushing and pulling; inhibiting full participation in heavy household ADLs and community activity. Would benefit from skilled PT to address above deficits and promote optimal return to PLOF.    Personal Factors and Comorbidities Comorbidity 3+;Past/Current Experience;Time since onset of injury/illness/exacerbation;Fitness    Comorbidities COPD, CHF, HTN, CKD    Examination-Activity Limitations Bed Mobility;Carry;Squat;Lift;Reach Overhead;Stand;Stairs;Transfers    Examination-Participation Restrictions Community Activity;Meal Prep;Laundry;Cleaning;Yard Work    [employer] officer Evolving/Moderate complexity    Clinical Decision Making Moderate    Rehab Potential Good    PT Frequency 2x / week     PT Duration 8 weeks    PT Treatment/Interventions Electrical  Stimulation;Moist Heat;Traction;Ultrasound;Gait training;Therapeutic activities;Neuromuscular re-education;Manual techniques;Spinal Manipulations;ADLs/Self Care Home Management;Passive range of motion;Therapeutic exercise;Functional mobility training;Stair training;Cryotherapy;Iontophoresis 4mg /ml Dexamethasone;Aquatic Therapy;DME Instruction;Balance training;Patient/family education;Dry needling;Joint Manipulations;Splinting;Taping    PT Next Visit Plan HEP review, 6MWT    PT Home Exercise Plan 5xSTS, supine piriformis and SKTC stretch    Consulted and Agree with Plan of Care Patient             Patient will benefit from skilled therapeutic intervention in order to improve the following deficits and impairments:  Decreased balance, Abnormal gait, Decreased activity tolerance, Decreased endurance, Decreased range of motion, Decreased strength, Increased fascial restricitons, Improper body mechanics, Pain, Postural dysfunction, Decreased coordination, Decreased mobility, Difficulty walking, Hypomobility, Increased muscle spasms, Impaired flexibility, Impaired tone  Visit Diagnosis: Chronic right-sided low back pain, unspecified whether sciatica present     Problem List Patient Active Problem List   Diagnosis Date Noted   AKI (acute kidney injury) ([place]) 07/19/2020   UTI (urinary tract infection) 07/18/2020   H/O urinary retention 07/18/2020   Hyponatremia 07/18/2020   Acute on chronic combined systolic and diastolic CHF (congestive heart failure) ([place]) 07/12/2020   Hypertensive emergency 07/12/2020   Elevated troponin 07/12/2020   Hypothyroidism 07/12/2020   Neuralgia and neuritis 03/14/2020   Primary generalized (osteo)arthritis 11/30/2019   Encounter for screening mammogram for malignant neoplasm of breast 11/30/2019   Vasomotor rhinitis 06/28/2019   Atopic dermatitis 06/28/2019   Localized superficial swelling, mass, or  lump 06/28/2019   Plantar neuroma of left foot 06/28/2019   Chronic obstructive pulmonary disease ([place]) 11/03/2018   Right flank pain 11/03/2018   Dysuria 11/03/2018   Acute upper respiratory infection 05/06/2018   Acquired hypothyroidism 03/18/2018   CKD (chronic kidney disease), stage IIIa 03/18/2018   ARF (acute renal failure) ([place]) 01/14/2018   Hypertensive crisis 01/14/2018   Acute respiratory failure with hypoxia ([place]) 01/13/2018   Encounter for general adult medical examination with abnormal findings 10/26/2017   Allergic rhinitis 10/26/2017   Vitamin D deficiency 10/26/2017   Gastroesophageal reflux disease without esophagitis 10/26/2017   Essential hypertension 04/16/2017   Thyroid disease 04/16/2017   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 09/15/2016   Dilated cardiomyopathy ([place]) 06/27/2016   Bilateral carotid artery stenosis 05/30/2016   SOBOE (shortness of breath on exertion) 05/30/2016   Hyperparathyroidism, primary ([place]) 08/25/2015   Hypercalcemia 08/12/2014   LVH (left ventricular hypertrophy) due to hypertensive disease, without heart failure 07/09/2014   Moderate mitral insufficiency 07/09/2014   Benign essential hypertension 07/06/2014   Osteoporosis, 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   Mixed hyperlipidemia 01/08/2014   Chronic pelvic pain in female 05/31/2011   Fibroids 05/31/2011   Sarcoidosis 05/31/2011   Durwin Reges DPT Durwin Reges, PT 03/29/2021, 3:04 PM  [facility] [address] [place], [address] Phone: [phone]   Fax:  [phone]  Name: RYLAND SMOOTS MRN: [id number] Date of Birth: Jul 08, 1945

## 2021-03-30 ENCOUNTER — Other Ambulatory Visit: Payer: Self-pay

## 2021-03-30 DIAGNOSIS — H0015 Chalazion left lower eyelid: Secondary | ICD-10-CM | POA: Diagnosis not present

## 2021-03-31 ENCOUNTER — Ambulatory Visit: Payer: Medicare Other | Admitting: Physical Therapy

## 2021-03-31 ENCOUNTER — Encounter: Payer: Self-pay | Admitting: Physical Therapy

## 2021-03-31 DIAGNOSIS — M545 Low back pain, unspecified: Secondary | ICD-10-CM | POA: Diagnosis not present

## 2021-03-31 DIAGNOSIS — M5441 Lumbago with sciatica, right side: Secondary | ICD-10-CM | POA: Diagnosis not present

## 2021-03-31 DIAGNOSIS — G8929 Other chronic pain: Secondary | ICD-10-CM

## 2021-03-31 NOTE — Therapy (Signed)
[facility] [address] [place], [address] Phone: [phone]   Fax:  [phone]  Physical Therapy Treatment  Patient Details  Name: Stacey [name] MRN: [id number] Date of Birth: 28-May-1945 Referring Provider (PT): Abernathy NP   Encounter Date: 03/31/2021   PT End of Session - 03/31/21 0957     Visit Number 2    Number of Visits 17    Date for PT Re-Evaluation 05/27/21    Authorization - Visit Number 2    Authorization - Number of Visits 10    PT Start Time 0950    PT Stop Time 1030    PT Time Calculation (min) 40 min    Activity Tolerance Patient tolerated treatment well    Behavior During Therapy [facility] for tasks assessed/performed             Past Medical History:  Diagnosis Date   CHF (congestive heart failure) ([place])    Chronic kidney disease    COPD (chronic obstructive pulmonary disease) ([place])    Hypertension    Sarcoidosis    Thyroid disease     Past Surgical History:  Procedure Laterality Date   CATARACT EXTRACTION     EYE SURGERY Right 07/09/2019   GALLBLADDER SURGERY     KNEE SURGERY     thyroidectomy      There were no vitals filed for this visit.   Subjective Assessment - 03/31/21 0955     Subjective Pt reports she is feeling pretty good today because she took it easy yesterday. Reports 6/10 pain today. Reports she has not been doing her HEP much yet, but wants to start them this week.    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 She reports no specific event or anything that exacerbated her pain, that she just wanted to try PT because she has been suffering with pain for so long. She has been managing her pain with medications through the pain clinic for "about 15 years" and about 3 steroid injections; no surgical intervention. Patient reports her LBP is R sided and radiates into the R buttock and down the R leg.  Her back pain is described as sharp in the low back and achy pain down the legs. Denies numbness, tingling, or electrical sensation (does report some occassional pain in L toes and L fingers). Current pain 7/10; best 0/10; worst 10/10. Pain is exacerbated by any walking/standing >1mins, cooking, bending foward, lifting, and carrying. Can reduce pain with rest, and stopping activity and taking naproxen before her pain increases. Pt completes bathing, dressing, feeding ind. She cooks but modifies this activity to decrease upright time, husband helps with heavy household chores. Is driving and completing limited community activity. Pt is retired, enjoys dancing with her church dance ministry once a month, keeping her 2 y/o great nephew. Lives at home with her husband in a one story home with 3 steps to enter with a handrail. She has a walk in shower with grab bars, denies issues navigating her home. Reports 1 fall in July 2022 when she stepped in a hole, denies fear of falling. Pt denies N/V, B&B changes, unexplained weight fluctuation, saddle paresthesia, fever, night sweats, or unrelenting night pain at this time.    Limitations Lifting;House hold activities;Standing;Walking    How long can you sit comfortably? unlimited    How long can you stand comfortably? 58mins  How long can you walk comfortably? 85mins    Diagnostic tests none    Patient Stated Goals Would like to be able to complete walking regimen and cooking/cleaning without pain    Pain Onset More than a month ago            Ther-Ex Nustep seat 2 UE 7 L2 58mins for gentle lumbar rotation and strengthening Lower trunk rotations x20 with max cuing needed to maintain spine contact to mat table Sidelying Open books x12 bilat with TC needed for set up, cuing for breath control throughout with good carry over [facility] 3x 10sec hold bilat TA activation in hooklying 3sec hold x15 with heavy cuing for first few reps, with good carry over with small  head lift Bridge with TA hold x12 with cuing for increased R hip height with some carry over Mini squat to mat table touch x8 with max cuing for hip ext activation for stand phase with some carry over from previous exercise Supine figure 4 piriformis stretch 30sec hold bilat                            PT Education - 03/31/21 0957     Education Details therex form/technique, HEP review    Person(s) Educated Patient    Methods Explanation;Demonstration;Verbal cues    Comprehension Verbalized understanding;Returned demonstration;Verbal cues required              PT Short Term Goals - 03/29/21 1456       PT SHORT TERM GOAL #1   Title Pt will be independent with HEP in order to improve strength and decrease back pain in order to improve pain-free function at home and work.    Baseline 03/29/21 HEP given    Time 4    Period Weeks    Status New               PT Long Term Goals - 03/29/21 1458       PT LONG TERM GOAL #1   Title Pt will decrease worst back pain as reported on NPRS by at least 2 points in order to demonstrate clinically significant reduction in back pain.    Baseline 04/08/21 10/10 LBP with activity    Time 8    Period Weeks    Status New      PT LONG TERM GOAL #2   Title Pt will decrease 5TSTS to at least 12 seconds in order to demonstrate clinically significant improvement in LE strength for age-matched strength norms    Baseline 03/29/21 18sec    Time 8    Period Weeks    Status New      PT LONG TERM GOAL #3   Title Pt will increase 10MWT to at least 1.0 m/s in order to demonstrate community ambulation speed with less likelihood of an adverse event    Baseline 03/29/21 self selected:0.10m/s fastest: 0.37m/s    Time 8    Period Weeks    Status New      PT LONG TERM GOAL #4   Title Patient will increase FOTO score to 49 to demonstrate predicted increase in functional mobility to complete ADLs    Baseline 03/29/21 40    Time 8     Period Weeks    Status New                   Plan - 03/31/21 1052  Clinical Impression Statement PT initiated therex for increased lumbar and hip mobility, and core and hip strengthening/stability with success. patient is able to comply with all cuing for proper technique of therex with some increased rest needed between therex due to cardiovascular fatigue. Patient reports decreased pain subjectively but with still 6/10 on NPRS at end of session. PT reviewed HEP therex with patient with some corrections needed with understanding of these. PT will continue progression as able.    Personal Factors and Comorbidities Comorbidity 3+;Past/Current Experience;Time since onset of injury/illness/exacerbation;Fitness    Comorbidities COPD, CHF, HTN, CKD    Examination-Activity Limitations Bed Mobility;Carry;Squat;Lift;Reach Overhead;Stand;Stairs;Transfers    Examination-Participation Restrictions Community Activity;Meal Prep;Laundry;Cleaning;Yard Work    [employer] officer Evolving/Moderate complexity    Clinical Decision Making Moderate    Rehab Potential Good    PT Frequency 2x / week    PT Duration 8 weeks    PT Treatment/Interventions Electrical Stimulation;Moist Heat;Traction;Ultrasound;Gait training;Therapeutic activities;Neuromuscular re-education;Manual techniques;Spinal Manipulations;ADLs/Self Care Home Management;Passive range of motion;Therapeutic exercise;Functional mobility training;Stair training;Cryotherapy;Iontophoresis 4mg /ml Dexamethasone;Aquatic Therapy;DME Instruction;Balance training;Patient/family education;Dry needling;Joint Manipulations;Splinting;Taping    PT Next Visit Plan HEP review, 6MWT    PT Home Exercise Plan 5xSTS, supine piriformis and SKTC stretch    Consulted and Agree with Plan of Care Patient             Patient will benefit from skilled therapeutic intervention in order to improve the following deficits and impairments:   Decreased balance, Abnormal gait, Decreased activity tolerance, Decreased endurance, Decreased range of motion, Decreased strength, Increased fascial restricitons, Improper body mechanics, Pain, Postural dysfunction, Decreased coordination, Decreased mobility, Difficulty walking, Hypomobility, Increased muscle spasms, Impaired flexibility, Impaired tone  Visit Diagnosis: Chronic right-sided low back pain, unspecified whether sciatica present     Problem List Patient Active Problem List   Diagnosis Date Noted   AKI (acute kidney injury) ([place]) 07/19/2020   UTI (urinary tract infection) 07/18/2020   H/O urinary retention 07/18/2020   Hyponatremia 07/18/2020   Acute on chronic combined systolic and diastolic CHF (congestive heart failure) ([place]) 07/12/2020   Hypertensive emergency 07/12/2020   Elevated troponin 07/12/2020   Hypothyroidism 07/12/2020   Neuralgia and neuritis 03/14/2020   Primary generalized (osteo)arthritis 11/30/2019   Encounter for screening mammogram for malignant neoplasm of breast 11/30/2019   Vasomotor rhinitis 06/28/2019   Atopic dermatitis 06/28/2019   Localized superficial swelling, mass, or lump 06/28/2019   Plantar neuroma of left foot 06/28/2019   Chronic obstructive pulmonary disease ([place]) 11/03/2018   Right flank pain 11/03/2018   Dysuria 11/03/2018   Acute upper respiratory infection 05/06/2018   Acquired hypothyroidism 03/18/2018   CKD (chronic kidney disease), stage IIIa 03/18/2018   ARF (acute renal failure) ([place]) 01/14/2018   Hypertensive crisis 01/14/2018   Acute respiratory failure with hypoxia ([place]) 01/13/2018   Encounter for general adult medical examination with abnormal findings 10/26/2017   Allergic rhinitis 10/26/2017   Vitamin D deficiency 10/26/2017   Gastroesophageal reflux disease without esophagitis 10/26/2017   Essential hypertension 04/16/2017   Thyroid disease 04/16/2017   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 09/15/2016    Dilated cardiomyopathy ([place]) 06/27/2016   Bilateral carotid artery stenosis 05/30/2016   SOBOE (shortness of breath on exertion) 05/30/2016   Hyperparathyroidism, primary ([place]) 08/25/2015   Hypercalcemia 08/12/2014   LVH (left ventricular hypertrophy) due to hypertensive disease, without heart failure 07/09/2014   Moderate mitral insufficiency 07/09/2014   Benign essential hypertension 07/06/2014   Osteoporosis, 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   Mixed hyperlipidemia 01/08/2014   Chronic  pelvic pain in female 05/31/2011   Fibroids 05/31/2011   Sarcoidosis 05/31/2011    Durwin Reges, PT 03/31/2021, 11:14 AM  [facility] [address], [place], [address] Phone: [phone]   Fax:  [phone]  Name: Stacey [name] MRN: [id number] Date of Birth: 1945/10/27

## 2021-04-04 NOTE — Progress Notes (Signed)
Patient ID: Stacey [name], female    DOB: 03/26/1945, 76 y.o.   MRN: [id number]  HPI  Stacey [name] is a 76 y/o female with a history of HTN, CKD, thyroid disease, COPD and chronic heart failure.   Echo report from 07/12/20 reviewed and showed an EF of 20-25% along with mild MR/AR/ AS.   Has not been admitted or been in the ED in the last 6 months.   She presents today for a follow-up visit with a chief complaint of minimal fatigue upon moderate exertion. She describes this as chronic in nature having been present for several years. She has associated dry cough, shortness of breath and chronic pain along with this. She denies any difficulty sleeping, dizziness, abdominal distention, palpitations, pedal edema, chest pain or weight gain.   Past Medical History:  Diagnosis Date   CHF (congestive heart failure) (HCC)    Chronic kidney disease    COPD (chronic obstructive pulmonary disease) ([place])    Hypertension    Sarcoidosis    Thyroid disease    Past Surgical History:  Procedure Laterality Date   CATARACT EXTRACTION     EYE SURGERY Right 07/09/2019   GALLBLADDER SURGERY     KNEE SURGERY     thyroidectomy     Family History  Problem Relation Age of Onset   Hypertension Mother    Diabetes Mother    Anuerysm Mother    Diabetes Father    Hypertension Father    Prostate cancer Neg Hx    Bladder Cancer Neg Hx    Kidney cancer Neg Hx    Breast cancer Neg Hx    Social History   Tobacco Use   Smoking status: Never   Smokeless tobacco: Never  Substance Use Topics   Alcohol use: No   Allergies  Allergen Reactions   Codeine Other (See Comments)    Other reaction(s): GI Intolerance Other Reaction: nausea   chest pain Nausea  Other reaction(s): GI Intolerance, Other (See Comments) Nausea GI Intolerance, nausea, chest pain    Propoxyphene Other (See Comments)    Other Reaction: nausea and chest pain   Alendronate     Other reaction(s): Other (See Comments) Chest pain    Ciprofloxacin Rash   Lisinopril     Other reaction(s): Cough   Amlodipine Swelling   Aspirin     Other reaction(s): Other (See Comments) Other reaction(s): Other (See Comments) Stomach hurt   Methotrexate Hives   Tramadol Nausea And Vomiting   Vicodin [Hydrocodone-Acetaminophen] Nausea And Vomiting   Prior to Admission medications   Medication Sig Start Date End Date Taking? Authorizing Provider  albuterol (VENTOLIN HFA) 108 (90 Base) MCG/ACT inhaler Inhale into the lungs every 6 (six) hours as needed for wheezing or shortness of breath.   Yes [provider]  carvedilol (COREG) 6.25 MG tablet Take 1 tablet (6.25 mg total) by mouth 2 (two) times daily with a meal. 07/15/20  Yes Jennye Boroughs, MD  cetirizine (ZYRTEC) 10 MG tablet Take 1 tablet (10 mg total) by mouth daily. 08/23/20  Yes McDonough, Lauren K, PA-C  cloNIDine (CATAPRES) 0.1 MG tablet Take 1 tablet (0.1 mg total) by mouth 2 (two) times daily. 03/02/21  Yes Abernathy, Yetta Flock, NP  dapagliflozin propanediol (FARXIGA) 10 MG TABS tablet Take 1 tablet (10 mg total) by mouth daily before breakfast. 12/10/20  Yes Leahann Lempke A, FNP  fluticasone (FLONASE) 50 MCG/ACT nasal spray Place 2 sprays into both nostrils daily. 03/23/20  Yes Harris,  Tanna Furry, NP  fluticasone (FLOVENT HFA) 110 MCG/ACT inhaler Inhale 1 puff into the lungs 2 (two) times daily. 09/27/20  Yes [name], MD  furosemide (LASIX) 20 MG tablet Take 2 tablets (40 mg total) by mouth daily. 03/23/21  Yes Isobel Eisenhuth, Otila Kluver A, FNP  gabapentin (NEURONTIN) 100 MG capsule TAKE 2 CAPSULES(200 MG) BY MOUTH AT BEDTIME 12/31/20  Yes McDonough, Lauren K, PA-C  hydrALAZINE (APRESOLINE) 50 MG tablet TAKE 2 TABLET BY MOUTH THREE TIMES DAILY 02/17/21  Yes Abernathy, Alyssa, NP  latanoprost (XALATAN) 0.005 % ophthalmic solution Place 1 drop into both eyes at bedtime.  01/16/14  Yes [provider]  levothyroxine (SYNTHROID, LEVOTHROID) 88 MCG tablet Take 88 mcg by mouth daily  before breakfast.  03/11/17  Yes [provider]  MELATONIN CR PO Take by mouth.   Yes [provider]  montelukast (SINGULAIR) 10 MG tablet Take 1 tablet (10 mg total) by mouth daily. 08/23/20  Yes McDonough, Si Gaul, PA-C  Multiple Vitamins-Minerals (MULTIVITAMIN GUMMIES WOMENS) CHEW Chew 2 tablets by mouth daily.   Yes [provider]  naproxen (NAPROSYN) 500 MG tablet Take 500 mg by mouth daily as needed.   Yes [provider]  pantoprazole (PROTONIX) 40 MG tablet TAKE 1 TABLET BY MOUTH DAILY AS NEEDED 03/22/21  Yes Abernathy, Alyssa, NP  pravastatin (PRAVACHOL) 20 MG tablet Take 1 tablet (20 mg total) by mouth at bedtime. 03/03/21  Yes Abernathy, Alyssa, NP  sacubitril-valsartan (ENTRESTO) 24-26 MG Take 1 tablet by mouth 2 (two) times daily. 09/29/20  Yes Donaldson Richter, Otila Kluver A, FNP  timolol (TIMOPTIC) 0.25 % ophthalmic solution Place 1 drop into both eyes 2 (two) times daily.  07/30/09  Yes [provider]  cyclobenzaprine (FLEXERIL) 10 MG tablet Take 1 tablet (10 mg total) by mouth daily as needed for muscle spasms. Patient not taking: Reported on 04/05/2021 11/15/20   Jonetta Osgood, NP  neomycin-polymyxin b-dexamethasone (MAXITROL) 3.5-10000-0.1 SUSP Place into the left eye. 03/30/21   [provider]  ondansetron (ZOFRAN ODT) 4 MG disintegrating tablet Take 1 tablet (4 mg total) by mouth every 8 (eight) hours as needed. Patient not taking: Reported on 04/05/2021 06/25/20   Menshew, Dannielle Karvonen, PA-C   Review of Systems  Constitutional:  Positive for fatigue (minimal). Negative for appetite change.  HENT:  Positive for rhinorrhea. Negative for congestion, postnasal drip and sore throat.   Eyes: Negative.   Respiratory:  Positive for cough (dry) and shortness of breath (very little). Negative for chest tightness.   Cardiovascular:  Negative for chest pain, palpitations and leg swelling.  Gastrointestinal:  Negative for abdominal distention and  abdominal pain.  Endocrine: Negative.   Genitourinary: Negative.   Musculoskeletal:  Positive for arthralgias (hands/ thumb) and back pain.  Skin: Negative.   Allergic/Immunologic: Negative.   Neurological:  Negative for dizziness and light-headedness.  Hematological:  Negative for adenopathy. Does not bruise/bleed easily.  Psychiatric/Behavioral:  Negative for dysphoric mood and sleep disturbance (sleeping on 2 pillows; taking melatonin). The patient is not nervous/anxious.    Vitals:   04/05/21 1025  BP: (!) 144/60  Pulse: (!) 43  Resp: 18  SpO2: 99%  Weight: 144 lb (65.3 kg)  Height: 4\' 9"  (1.448 m)   Wt Readings from Last 3 Encounters:  04/05/21 144 lb (65.3 kg)  03/02/21 142 lb 3.2 oz (64.5 kg)  02/17/21 143 lb (64.9 kg)   Lab Results  Component Value Date   CREATININE 1.62 (H) 01/11/2021  CREATININE 1.31 (H) 12/09/2020   CREATININE 1.32 (H) 09/28/2020   Physical Exam Vitals and nursing note reviewed.  Constitutional:      Appearance: Normal appearance.  HENT:     Head: Normocephalic and atraumatic.     Right Ear: Decreased hearing noted.     Left Ear: Decreased hearing noted.  Cardiovascular:     Rate and Rhythm: Regular rhythm. Bradycardia present.  Pulmonary:     Effort: Pulmonary effort is normal. No respiratory distress.     Breath sounds: No wheezing or rales.  Abdominal:     General: There is no distension.     Palpations: Abdomen is soft.     Tenderness: There is no abdominal tenderness.  Musculoskeletal:        General: No tenderness.     Cervical back: Normal range of motion and neck supple.     Right lower leg: No edema.     Left lower leg: No edema.  Skin:    General: Skin is warm and dry.  Neurological:     General: No focal deficit present.     Mental Status: She is alert and oriented to person, place, and time.  Psychiatric:        Mood and Affect: Mood normal.        Behavior: Behavior normal.        Thought Content: Thought content  normal.   Assessment & Plan:  1: Chronic heart failure with reduced ejection fraction- - NYHA class II - euvolemic today - weighing daily; reminded to call for an overnight weight gain of > 2 pounds or a weekly weight gain of > 5 pounds - weight stable from last visit here 6 weeks ago - not adding salt and has been reading food labels for sodium content - furosemide decreased at last visit due to dry mouth; feels like it's a little bit better - saw cardiology Petra Kuba) 01/12/21 - on GDMT of carvedilol, farxiga and entresto - receiving entresto through [employer] patient assistance program - discussed (again) adding spironolactone and the need for lab work in 1 week, 1 month & then 6 months; patient would prefer to hold off starting this at this time - participating in paramedicine program - BNP 07/18/20 was 68.3 - PharmD reconciled medications with the patient  2: HTN with CKD- - BP mildly elevated (144/60) but she just took the clonidine ~ 20 minutes ago - had virtual visit with PCP (Abernathy) 03/02/21 - BMP 01/11/21 reviewed and showed sodium 141, potassium 3.7, creatinine 1.62 and GFR 33 - patient having labs today by Dr Gabriel Carina for her thyroid; RX written for BMP to be added if not already being checked so that she only has to get stuck one time   Medication bottles reviewed.   Return in 2 months, sooner if needed

## 2021-04-05 ENCOUNTER — Encounter: Payer: Self-pay | Admitting: Family

## 2021-04-05 ENCOUNTER — Ambulatory Visit: Payer: Medicare Other | Attending: Family | Admitting: Family

## 2021-04-05 ENCOUNTER — Other Ambulatory Visit: Payer: Self-pay

## 2021-04-05 ENCOUNTER — Encounter: Payer: Self-pay | Admitting: Pharmacist

## 2021-04-05 VITALS — BP 144/60 | HR 43 | Resp 18 | Ht <= 58 in | Wt 144.0 lb

## 2021-04-05 DIAGNOSIS — Z8249 Family history of ischemic heart disease and other diseases of the circulatory system: Secondary | ICD-10-CM | POA: Insufficient documentation

## 2021-04-05 DIAGNOSIS — I10 Essential (primary) hypertension: Secondary | ICD-10-CM

## 2021-04-05 DIAGNOSIS — Z7984 Long term (current) use of oral hypoglycemic drugs: Secondary | ICD-10-CM | POA: Diagnosis not present

## 2021-04-05 DIAGNOSIS — M25541 Pain in joints of right hand: Secondary | ICD-10-CM | POA: Insufficient documentation

## 2021-04-05 DIAGNOSIS — I5022 Chronic systolic (congestive) heart failure: Secondary | ICD-10-CM | POA: Diagnosis not present

## 2021-04-05 DIAGNOSIS — N189 Chronic kidney disease, unspecified: Secondary | ICD-10-CM | POA: Diagnosis not present

## 2021-04-05 DIAGNOSIS — M25549 Pain in joints of unspecified hand: Secondary | ICD-10-CM | POA: Diagnosis not present

## 2021-04-05 DIAGNOSIS — J449 Chronic obstructive pulmonary disease, unspecified: Secondary | ICD-10-CM | POA: Diagnosis not present

## 2021-04-05 DIAGNOSIS — E079 Disorder of thyroid, unspecified: Secondary | ICD-10-CM | POA: Diagnosis not present

## 2021-04-05 DIAGNOSIS — M25542 Pain in joints of left hand: Secondary | ICD-10-CM | POA: Diagnosis not present

## 2021-04-05 DIAGNOSIS — I130 Hypertensive heart and chronic kidney disease with heart failure and stage 1 through stage 4 chronic kidney disease, or unspecified chronic kidney disease: Secondary | ICD-10-CM | POA: Insufficient documentation

## 2021-04-05 DIAGNOSIS — M549 Dorsalgia, unspecified: Secondary | ICD-10-CM | POA: Diagnosis not present

## 2021-04-05 DIAGNOSIS — Z79899 Other long term (current) drug therapy: Secondary | ICD-10-CM | POA: Insufficient documentation

## 2021-04-05 DIAGNOSIS — E039 Hypothyroidism, unspecified: Secondary | ICD-10-CM | POA: Diagnosis not present

## 2021-04-05 DIAGNOSIS — M810 Age-related osteoporosis without current pathological fracture: Secondary | ICD-10-CM | POA: Diagnosis not present

## 2021-04-05 DIAGNOSIS — N1831 Chronic kidney disease, stage 3a: Secondary | ICD-10-CM | POA: Diagnosis not present

## 2021-04-05 DIAGNOSIS — E210 Primary hyperparathyroidism: Secondary | ICD-10-CM | POA: Diagnosis not present

## 2021-04-05 NOTE — Patient Instructions (Signed)
Continue weighing daily and call for an overnight weight gain of 3 pounds or more or a weekly weight gain of more than 5 pounds.  ° °The [facility] will be moving around the corner to [id number] mid-February. Our phone number will remain the same ° °

## 2021-04-05 NOTE — Progress Notes (Signed)
[place] - PHARMACIST COUNSELING NOTE   Guideline-Directed Medical Therapy/Evidence Based Medicine  ACE/ARB/ARNI: Sacubitril-valsartan 24-26 mg twice daily Beta Blocker: Carvedilol 6.25 mg twice daily Aldosterone Antagonist:  none Diuretic: Furosemide 40 mg daily SGLT2i: Empagliflozin 10 mg daily  Adherence Assessment  Do you ever forget to take your medication? [] Yes [x] No  Do you ever skip doses due to side effects? [] Yes [x] No  Do you have trouble affording your medicines? [] Yes [x] No  Are you ever unable to pick up your medication due to transportation difficulties? [] Yes [x] No  Do you ever stop taking your medications because you don't believe they are helping? [] Yes [x] No  Do you check your weight daily? [x] Yes [] No   Adherence strategy: 2 pill box  Barriers to obtaining medications: none  Vital signs: HR 43, BP 144/60, weight (pounds) 144 lb  ECHO: Date 07/12/20, EF 20-25%, left ventricle has severely decreased function. The left ventricle  demonstrates global hypokinesis. The left ventricular internal cavity size  was mildly dilated.  BMP Latest Ref Rng & Units 01/11/2021 12/09/2020 09/28/2020  Glucose 70 - 99 mg/dL 193(H) 126(H) 125(H)  BUN 8 - 23 mg/dL 27(H) 24(H) 17  Creatinine 0.44 - 1.00 mg/dL 1.62(H) 1.31(H) 1.32(H)  Sodium 135 - 145 mmol/L 141 138 140  Potassium 3.5 - 5.1 mmol/L 3.7 4.3 3.7  Chloride 98 - 111 mmol/L 106 103 106  CO2 22 - 32 mmol/L 26 27 27   Calcium 8.9 - 10.3 mg/dL 11.2(H) 10.6(H) 10.9(H)    Past Medical History:  Diagnosis Date   CHF (congestive heart failure) (HCC)    Chronic kidney disease    COPD (chronic obstructive pulmonary disease) ([place])    Hypertension    Sarcoidosis    Thyroid disease     ASSESSMENT 76 year old female who presents to the HF clinic for follow up. No admissions to ED in the last 6 months and already on Entresto, BB, furosemide, and SGLT2i. Noted patient  presets with asymptomatic bradycardia (HR 43 bpm), but denies dizziness, lightheadedness, chest pain, falls, or headaches. She remains reluctant to make changed in her therapy even after discussing possibility to add spironolactone to her current regimen for cardiovascular advantages and to compete recommended GDMT.  PLAN  Consider wean off clonidine, and add spironolactone to improve BP and HF management. Decrease carvedilol to 3.25mg  BID if HR remains in 40s or symptoms develop.   Time spent: 15 minutes  Vicky Schleich Rodriguez-Guzman PharmD, BCPS 04/05/2021 1:11 PM    Current Outpatient Medications:    acetaminophen (TYLENOL) 325 MG tablet, Take 650 mg by mouth every 6 (six) hours as needed., Disp: , Rfl:    albuterol (VENTOLIN HFA) 108 (90 Base) MCG/ACT inhaler, Inhale into the lungs every 6 (six) hours as needed for wheezing or shortness of breath., Disp: , Rfl:    carvedilol (COREG) 6.25 MG tablet, Take 1 tablet (6.25 mg total) by mouth 2 (two) times daily with a meal., Disp: 60 tablet, Rfl: 0   cetirizine (ZYRTEC) 10 MG tablet, Take 1 tablet (10 mg total) by mouth daily., Disp: 90 tablet, Rfl: 4   cloNIDine (CATAPRES) 0.1 MG tablet, Take 1 tablet (0.1 mg total) by mouth 2 (two) times daily., Disp: 180 tablet, Rfl: 4   cyclobenzaprine (FLEXERIL) 10 MG tablet, Take 1 tablet (10 mg total) by mouth daily as needed for muscle spasms., Disp: 30 tablet, Rfl: 2   dapagliflozin propanediol (FARXIGA) 10 MG TABS tablet, Take 1 tablet (10 mg  total) by mouth daily before breakfast., Disp: 30 tablet, Rfl: 5   fluticasone (FLONASE) 50 MCG/ACT nasal spray, Place 2 sprays into both nostrils daily., Disp: 16 g, Rfl: 6   fluticasone (FLOVENT HFA) 110 MCG/ACT inhaler, Inhale 1 puff into the lungs 2 (two) times daily., Disp: 1 each, Rfl: 3   furosemide (LASIX) 20 MG tablet, Take 2 tablets (40 mg total) by mouth daily., Disp: 60 tablet, Rfl: 5   gabapentin (NEURONTIN) 100 MG capsule, TAKE 2 CAPSULES(200 MG) BY  MOUTH AT BEDTIME, Disp: 60 capsule, Rfl: 2   hydrALAZINE (APRESOLINE) 50 MG tablet, TAKE 2 TABLET BY MOUTH THREE TIMES DAILY, Disp: 540 tablet, Rfl: 1   latanoprost (XALATAN) 0.005 % ophthalmic solution, Place 1 drop into both eyes at bedtime. , Disp: , Rfl:    levothyroxine (SYNTHROID, LEVOTHROID) 88 MCG tablet, Take 88 mcg by mouth daily before breakfast. , Disp: , Rfl: 1   MELATONIN CR PO, Take by mouth., Disp: , Rfl:    montelukast (SINGULAIR) 10 MG tablet, Take 1 tablet (10 mg total) by mouth daily., Disp: 90 tablet, Rfl: 3   Multiple Vitamins-Minerals (MULTIVITAMIN GUMMIES WOMENS) CHEW, Chew 2 tablets by mouth daily., Disp: , Rfl:    ondansetron (ZOFRAN ODT) 4 MG disintegrating tablet, Take 1 tablet (4 mg total) by mouth every 8 (eight) hours as needed., Disp: 15 tablet, Rfl: 0   pantoprazole (PROTONIX) 40 MG tablet, TAKE 1 TABLET BY MOUTH DAILY AS NEEDED, Disp: 90 tablet, Rfl: 2   pravastatin (PRAVACHOL) 20 MG tablet, Take 1 tablet (20 mg total) by mouth at bedtime., Disp: 90 tablet, Rfl: 1   sacubitril-valsartan (ENTRESTO) 24-26 MG, Take 1 tablet by mouth 2 (two) times daily., Disp: 180 tablet, Rfl: 3   timolol (TIMOPTIC) 0.25 % ophthalmic solution, Place 1 drop into both eyes 2 (two) times daily. , Disp: , Rfl:    COUNSELING POINTS/CLINICAL PEARLS  DRUGS TO CAUTION IN HEART FAILURE  Drug or Class Mechanism  Analgesics NSAIDs COX-2 inhibitors Glucocorticoids  Sodium and water retention, increased systemic vascular resistance, decreased response to diuretics   Diabetes Medications Metformin Thiazolidinediones Rosiglitazone (Avandia) Pioglitazone (Actos) DPP4 Inhibitors Saxagliptin (Onglyza) Sitagliptin (Januvia)   Lactic acidosis Possible calcium channel blockade   Unknown  Antiarrhythmics Class I  Flecainide Disopyramide Class III Sotalol Other Dronedarone  Negative inotrope, proarrhythmic   Proarrhythmic, beta blockade  Negative inotrope   Antihypertensives Alpha Blockers Doxazosin Calcium Channel Blockers Diltiazem Verapamil Nifedipine Central Alpha Adrenergics Moxonidine Peripheral Vasodilators Minoxidil  Increases renin and aldosterone  Negative inotrope    Possible sympathetic withdrawal  Unknown  Anti-infective Itraconazole Amphotericin B  Negative inotrope Unknown  Hematologic Anagrelide Cilostazol   Possible inhibition of PD IV Inhibition of PD III causing arrhythmias  Neurologic/Psychiatric Stimulants Anti-Seizure Drugs Carbamazepine Pregabalin Antidepressants Tricyclics Citalopram Parkinsons Bromocriptine Pergolide Pramipexole Antipsychotics Clozapine Antimigraine Ergotamine Methysergide Appetite suppressants Bipolar Lithium  Peripheral alpha and beta agonist activity  Negative inotrope and chronotrope Calcium channel blockade  Negative inotrope, proarrhythmic Dose-dependent QT prolongation  Excessive serotonin activity/valvular damage Excessive serotonin activity/valvular damage Unknown  IgE mediated hypersensitivy, calcium channel blockade  Excessive serotonin activity/valvular damage Excessive serotonin activity/valvular damage Valvular damage  Direct myofibrillar degeneration, adrenergic stimulation  Antimalarials Chloroquine Hydroxychloroquine Intracellular inhibition of lysosomal enzymes  Urologic Agents Alpha Blockers Doxazosin Prazosin Tamsulosin Terazosin  Increased renin and aldosterone  Adapted from Page Carleene Overlie, et al. Drugs That May Cause or Exacerbate Heart Failure: A Scientific Statement from the American Heart  Association. Circulation 2016; 134:e32-e69. DOI:  10.1161/CIR.0000000000000426   MEDICATION ADHERENCES TIPS AND STRATEGIES Taking medication as prescribed improves patient outcomes in heart failure (reduces hospitalizations, improves symptoms, increases survival) Side effects of medications can be managed by decreasing doses, switching  agents, stopping drugs, or adding additional therapy. Please let someone in the [facility] know if you have having bothersome side effects so we can modify your regimen. Do not alter your medication regimen without talking to [place].  Medication reminders can help patients remember to take drugs on time. If you are missing or forgetting doses you can try linking behaviors, using pill boxes, or an electronic reminder like an alarm on your phone or an app. Some people can also get automated phone calls as medication reminders.

## 2021-04-07 ENCOUNTER — Ambulatory Visit: Payer: Medicare Other | Admitting: Physical Therapy

## 2021-04-07 ENCOUNTER — Encounter: Payer: Self-pay | Admitting: Physical Therapy

## 2021-04-07 DIAGNOSIS — M545 Low back pain, unspecified: Secondary | ICD-10-CM | POA: Diagnosis not present

## 2021-04-07 DIAGNOSIS — G8929 Other chronic pain: Secondary | ICD-10-CM

## 2021-04-07 DIAGNOSIS — M5441 Lumbago with sciatica, right side: Secondary | ICD-10-CM | POA: Diagnosis not present

## 2021-04-07 NOTE — Therapy (Signed)
[facility] [address], [place], [address] Phone: [phone]   Fax:  [phone]  Physical Therapy Treatment  Patient Details  Name: Stacey [name] MRN: [id number] Date of Birth: 1946-01-22 Referring Provider (PT): Abernathy NP   Encounter Date: 04/07/2021   PT End of Session - 04/07/21 1208     Visit Number 3    Number of Visits 17    Date for PT Re-Evaluation 05/27/21    Authorization - Visit Number 3    Authorization - Number of Visits 10    PT Start Time 1115    PT Stop Time 1200    PT Time Calculation (min) 45 min    Activity Tolerance Patient tolerated treatment well    Behavior During Therapy [facility] for tasks assessed/performed             Past Medical History:  Diagnosis Date   CHF (congestive heart failure) ([place])    Chronic kidney disease    COPD (chronic obstructive pulmonary disease) ([place])    Hypertension    Sarcoidosis    Thyroid disease     Past Surgical History:  Procedure Laterality Date   CATARACT EXTRACTION     EYE SURGERY Right 07/09/2019   GALLBLADDER SURGERY     KNEE SURGERY     thyroidectomy      There were no vitals filed for this visit.   Subjective Assessment - 04/07/21 1116     Subjective pnt reports feeling pretty good today. tuesday the pain was very bad from standing for a long period of time. yesterday she cooked and took breaks which helped manage her pain and she also walked a little with no pain. pnt reports not completing HEP and needs a review as well as 0/10 NPS.            SPT gave education on breathing, HEP, anatomy, and bed mobility. Pnt showed understanding through verbal and demonstration.   Ther-Ex Nustep seat 2 UE 7 L2 68mins for gentle lumbar rotation and strengthening Lower trunk rotations 12 x2 TA 4-7-8 breath cueing for TA activation  TA march mod cueing for TA activation x10  Glute bridges set 6x with curing for neutral spine  and TA activation - before cueing 7/10 post cueing 4/10 Double knee to chest stretch 30 sec Sit to stands 3x 6- pnt seft corrected with TA activation before standing                              PT Education - 04/07/21 1208     Education Details therex form, HEP, anatomy, bed mobility    Person(s) Educated Patient    Methods Explanation;Demonstration;Tactile cues;Verbal cues    Comprehension Verbalized understanding;Returned demonstration              PT Short Term Goals - 04/07/21 1215       PT SHORT TERM GOAL #1   Title Pt will be independent with HEP in order to improve strength and decrease back pain in order to improve pain-free function at home and work.    Baseline 03/29/21 HEP given    Time 4    Period Weeks    Status New               PT Long Term Goals - 04/07/21 1216       PT LONG TERM GOAL #1   Title  Pt will decrease worst back pain as reported on NPRS by at least 2 points in order to demonstrate clinically significant reduction in back pain.    Baseline 04/08/21 10/10 LBP with activity    Time 8    Period Weeks    Status New      PT LONG TERM GOAL #2   Title Pt will decrease 5TSTS to at least 12 seconds in order to demonstrate clinically significant improvement in LE strength for age-matched strength norms    Baseline 03/29/21 18sec    Time 8    Period Weeks    Status New      PT LONG TERM GOAL #3   Title Pt will increase 10MWT to at least 1.0 m/s in order to demonstrate community ambulation speed with less likelihood of an adverse event    Baseline 03/29/21 self selected:0.51m/s fastest: 0.74m/s    Time 8    Period Weeks    Status New      PT LONG TERM GOAL #4   Title Patient will increase FOTO score to 49 to demonstrate predicted increase in functional mobility to complete ADLs    Baseline 03/29/21 40    Time 8    Period Weeks    Status New                   Plan - 04/07/21 1210     Clinical Impression  Statement Pnt presented with improved AROM in lumbar spine and decreased pain. Impairments include deficits in pain, TA activation, and biomechanics . Pnt showed tolerance to exercise progressions with tactile and verbal cues to enforce proper form and sequencing with good motivation. Pnt verbalized and demonstrated understanding of PT directions and has good rehab potential. 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 complexity    Clinical Decision Making Moderate    Rehab Potential Good    PT Frequency 2x / week    PT Duration 8 weeks    PT Treatment/Interventions Electrical Stimulation;Moist Heat;Traction;Ultrasound;Gait training;Therapeutic activities;Neuromuscular re-education;Manual techniques;Spinal Manipulations;ADLs/Self Care Home Management;Passive range of motion;Therapeutic exercise;Functional mobility training;Stair training;Cryotherapy;Iontophoresis 4mg /ml Dexamethasone;Aquatic Therapy;DME Instruction;Balance training;Patient/family education;Dry needling;Joint Manipulations;Splinting;Taping             Patient will benefit from skilled therapeutic intervention in order to improve the following deficits and impairments:  Decreased balance, Abnormal gait, Decreased activity tolerance, Decreased endurance, Decreased range of motion, Decreased strength, Increased fascial restricitons, Improper body mechanics, Pain, Postural dysfunction, Decreased coordination, Decreased mobility, Difficulty walking,  Hypomobility, Increased muscle spasms, Impaired flexibility, Impaired tone  Visit Diagnosis: Chronic right-sided low back pain, unspecified whether sciatica present     Problem List Patient Active Problem List   Diagnosis Date Noted   AKI (acute kidney injury) ([place]) 07/19/2020   UTI (urinary tract infection) 07/18/2020   H/O urinary retention 07/18/2020   Hyponatremia 07/18/2020   Acute on chronic combined systolic and diastolic CHF (congestive heart failure) ([place]) 07/12/2020   Hypertensive emergency 07/12/2020   Elevated troponin 07/12/2020   Hypothyroidism 07/12/2020   Neuralgia and neuritis 03/14/2020   Primary generalized (osteo)arthritis 11/30/2019   Encounter for screening mammogram for malignant neoplasm of breast 11/30/2019   Vasomotor  rhinitis 06/28/2019   Atopic dermatitis 06/28/2019   Localized superficial swelling, mass, or lump 06/28/2019   Plantar neuroma of left foot 06/28/2019   Chronic obstructive pulmonary disease ([place]) 11/03/2018   Right flank pain 11/03/2018   Dysuria 11/03/2018   Acute upper respiratory infection 05/06/2018   Acquired hypothyroidism 03/18/2018   CKD (chronic kidney disease), stage IIIa 03/18/2018   ARF (acute renal failure) ([place]) 01/14/2018   Hypertensive crisis 01/14/2018   Acute respiratory failure with hypoxia ([place]) 01/13/2018   Encounter for general adult medical examination with abnormal findings 10/26/2017   Allergic rhinitis 10/26/2017   Vitamin D deficiency 10/26/2017   Gastroesophageal reflux disease without esophagitis 10/26/2017   Essential hypertension 04/16/2017   Thyroid disease 04/16/2017   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 09/15/2016   Dilated cardiomyopathy ([place]) 06/27/2016   Bilateral carotid artery stenosis 05/30/2016   SOBOE (shortness of breath on exertion) 05/30/2016   Hyperparathyroidism, primary ([place]) 08/25/2015   Hypercalcemia 08/12/2014   LVH (left ventricular hypertrophy) due to hypertensive  disease, without heart failure 07/09/2014   Moderate mitral insufficiency 07/09/2014   Benign essential hypertension 07/06/2014   Osteoporosis, 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   Mixed hyperlipidemia 01/08/2014   Chronic pelvic pain in female 05/31/2011   Fibroids 05/31/2011   Sarcoidosis 05/31/2011     Durwin Reges DPT Claiborne Billings O'Daniel, SPT Durwin Reges, PT 04/08/2021, 8:43 AM  [facility] [address], [place], [address] Phone: [phone]   Fax:  [phone]  Name: Stacey [name] MRN: [id number] Date of Birth: [birth year]

## 2021-04-10 ENCOUNTER — Other Ambulatory Visit: Payer: Self-pay | Admitting: Physician Assistant

## 2021-04-10 DIAGNOSIS — M792 Neuralgia and neuritis, unspecified: Secondary | ICD-10-CM

## 2021-04-12 ENCOUNTER — Other Ambulatory Visit: Payer: Self-pay

## 2021-04-12 ENCOUNTER — Ambulatory Visit: Payer: Medicare Other | Admitting: Physical Therapy

## 2021-04-12 ENCOUNTER — Encounter: Payer: Self-pay | Admitting: Physical Therapy

## 2021-04-12 DIAGNOSIS — M5441 Lumbago with sciatica, right side: Secondary | ICD-10-CM | POA: Diagnosis not present

## 2021-04-12 DIAGNOSIS — E039 Hypothyroidism, unspecified: Secondary | ICD-10-CM | POA: Diagnosis not present

## 2021-04-12 DIAGNOSIS — M810 Age-related osteoporosis without current pathological fracture: Secondary | ICD-10-CM | POA: Diagnosis not present

## 2021-04-12 DIAGNOSIS — M545 Low back pain, unspecified: Secondary | ICD-10-CM

## 2021-04-12 DIAGNOSIS — R7303 Prediabetes: Secondary | ICD-10-CM | POA: Diagnosis not present

## 2021-04-12 DIAGNOSIS — R739 Hyperglycemia, unspecified: Secondary | ICD-10-CM | POA: Diagnosis not present

## 2021-04-12 DIAGNOSIS — N1831 Chronic kidney disease, stage 3a: Secondary | ICD-10-CM | POA: Diagnosis not present

## 2021-04-12 DIAGNOSIS — G8929 Other chronic pain: Secondary | ICD-10-CM | POA: Diagnosis not present

## 2021-04-12 DIAGNOSIS — E210 Primary hyperparathyroidism: Secondary | ICD-10-CM | POA: Diagnosis not present

## 2021-04-12 NOTE — Therapy (Signed)
[facility] [address], [place], [address] Phone: [phone]   Fax:  [phone]  Physical Therapy Treatment  Patient Details  Name: Stacey [name] MRN: [id number] Date of Birth: 1946/01/12 Referring Provider (PT): Abernathy NP   Encounter Date: 04/12/2021   PT End of Session - 04/12/21 1033     Visit Number 4    Number of Visits 17    Date for PT Re-Evaluation 05/27/21    Authorization - Visit Number 4    Authorization - Number of Visits 10    PT Start Time 0950    PT Stop Time 1020    PT Time Calculation (min) 30 min    Activity Tolerance Patient tolerated treatment well    Behavior During Therapy [facility] for tasks assessed/performed             Past Medical History:  Diagnosis Date   CHF (congestive heart failure) ([place])    Chronic kidney disease    COPD (chronic obstructive pulmonary disease) ([place])    Hypertension    Sarcoidosis    Thyroid disease     Past Surgical History:  Procedure Laterality Date   CATARACT EXTRACTION     EYE SURGERY Right 07/09/2019   GALLBLADDER SURGERY     KNEE SURGERY     thyroidectomy      There were no vitals filed for this visit.   Subjective Assessment - 04/12/21 0955     Subjective pnt reports feeling pretty good today. she had a "bad episode" on saturday where her back flared up from taking down her Christmas tree and moving boxes. The next morining on Sunday the pain was gone. pnt reports no pain right now. Pnt reports being compliant with HEP. pnt seeing endrocrinologist today                    Ther-Ex Nustep seat 2 UE 7 L2 69mins for gentle lumbar rotation and strengthening Lower trunk rotations 12x for increased lumbar mobility Glute bridges set 8x 3 with curing for neutral spine and TA activation with good carry over Double knee to chest stretch 3x 30 sec Sit to stands 3x 8- pnt seft corrected with TA activation before squatting,  good carry over of initial cuing                     PT Education - 04/12/21 1032     Education Details therex form and sequencing    Person(s) Educated Patient    Methods Explanation;Demonstration    Comprehension Verbalized understanding;Returned demonstration              PT Short Term Goals - 04/07/21 1215       PT SHORT TERM GOAL #1   Title Pt will be independent with HEP in order to improve strength and decrease back pain in order to improve pain-free function at home and work.    Baseline 03/29/21 HEP given    Time 4    Period Weeks    Status New               PT Long Term Goals - 04/07/21 1216       PT LONG TERM GOAL #1   Title Pt will decrease worst back pain as reported on NPRS by at least 2 points in order to demonstrate clinically significant reduction in back pain.    Baseline 04/08/21 10/10 LBP with activity  Time 8    Period Weeks    Status New      PT LONG TERM GOAL #2   Title Pt will decrease 5TSTS to at least 12 seconds in order to demonstrate clinically significant improvement in LE strength for age-matched strength norms    Baseline 03/29/21 18sec    Time 8    Period Weeks    Status New      PT LONG TERM GOAL #3   Title Pt will increase 10MWT to at least 1.0 m/s in order to demonstrate community ambulation speed with less likelihood of an adverse event    Baseline 03/29/21 self selected:0.48m/s fastest: 0.16m/s    Time 8    Period Weeks    Status New      PT LONG TERM GOAL #4   Title Patient will increase FOTO score to 49 to demonstrate predicted increase in functional mobility to complete ADLs    Baseline 03/29/21 40    Time 8    Period Weeks    Status New                   Plan - 04/12/21 1034     Clinical Impression Statement Pnt presented with no pain and tolerated therex progression well with min cueing and no increase in pain. Impairments include TA activation and biomechanics.  PT progressed therex  for TA activation and increased strength in bilateral LE. pnt verbalized and demonstrated understadning of PT directions and has good rehab potential. Further strength training will be required to adddress listed deficits to achieve increased ROM and decreased pain.    Personal Factors and Comorbidities Comorbidity 3+;Past/Current Experience;Time since onset of injury/illness/exacerbation;Fitness    Comorbidities COPD, CHF, HTN, CKD    Examination-Activity Limitations Bed Mobility;Carry;Squat;Lift;Reach Overhead;Stand;Stairs;Transfers    Examination-Participation Restrictions Community Activity;Meal Prep;Laundry;Cleaning;Yard Work    [employer] officer Evolving/Moderate complexity    Clinical Decision Making Moderate    Rehab Potential Good    PT Frequency 2x / week    PT Duration 8 weeks    PT Treatment/Interventions Electrical Stimulation;Moist Heat;Traction;Ultrasound;Gait training;Therapeutic activities;Neuromuscular re-education;Manual techniques;Spinal Manipulations;ADLs/Self Care Home Management;Passive range of motion;Therapeutic exercise;Functional mobility training;Stair training;Cryotherapy;Iontophoresis 4mg /ml Dexamethasone;Aquatic Therapy;DME Instruction;Balance training;Patient/family education;Dry needling;Joint Manipulations;Splinting;Taping    PT Next Visit Plan HEP review, 6MWT    PT Home Exercise Plan 5xSTS, supine piriformis and SKTC stretch    Consulted and Agree with Plan of Care Patient             Patient will benefit from skilled therapeutic intervention in order to improve the following deficits and impairments:  Decreased balance, Abnormal gait, Decreased activity tolerance, Decreased endurance, Decreased range of motion, Decreased strength, Increased fascial restricitons, Improper body mechanics, Pain, Postural dysfunction, Decreased coordination, Decreased mobility, Difficulty walking, Hypomobility, Increased muscle spasms, Impaired flexibility,  Impaired tone  Visit Diagnosis: Chronic right-sided low back pain, unspecified whether sciatica present     Problem List Patient Active Problem List   Diagnosis Date Noted   AKI (acute kidney injury) ([place]) 07/19/2020   UTI (urinary tract infection) 07/18/2020   H/O urinary retention 07/18/2020   Hyponatremia 07/18/2020   Acute on chronic combined systolic and diastolic CHF (congestive heart failure) ([place]) 07/12/2020   Hypertensive emergency 07/12/2020   Elevated troponin 07/12/2020   Hypothyroidism 07/12/2020   Neuralgia and neuritis 03/14/2020   Primary generalized (osteo)arthritis 11/30/2019   Encounter for screening mammogram for malignant neoplasm of breast 11/30/2019   Vasomotor rhinitis 06/28/2019   Atopic dermatitis 06/28/2019  Localized superficial swelling, mass, or lump 06/28/2019   Plantar neuroma of left foot 06/28/2019   Chronic obstructive pulmonary disease ([place]) 11/03/2018   Right flank pain 11/03/2018   Dysuria 11/03/2018   Acute upper respiratory infection 05/06/2018   Acquired hypothyroidism 03/18/2018   CKD (chronic kidney disease), stage IIIa 03/18/2018   ARF (acute renal failure) ([place]) 01/14/2018   Hypertensive crisis 01/14/2018   Acute respiratory failure with hypoxia ([place]) 01/13/2018   Encounter for general adult medical examination with abnormal findings 10/26/2017   Allergic rhinitis 10/26/2017   Vitamin D deficiency 10/26/2017   Gastroesophageal reflux disease without esophagitis 10/26/2017   Essential hypertension 04/16/2017   Thyroid disease 04/16/2017   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 09/15/2016   Dilated cardiomyopathy ([place]) 06/27/2016   Bilateral carotid artery stenosis 05/30/2016   SOBOE (shortness of breath on exertion) 05/30/2016   Hyperparathyroidism, primary ([place]) 08/25/2015   Hypercalcemia 08/12/2014   LVH (left ventricular hypertrophy) due to hypertensive disease, without heart failure 07/09/2014   Moderate mitral  insufficiency 07/09/2014   Benign essential hypertension 07/06/2014   Osteoporosis, 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   Mixed hyperlipidemia 01/08/2014   Chronic pelvic pain in female 05/31/2011   Fibroids 05/31/2011   Sarcoidosis 05/31/2011     Durwin Reges DPT Claiborne Billings O'Daniel, SPT Durwin Reges, PT 04/12/2021, 11:19 AM  [facility] [address] [place], [address] Phone: [phone]   Fax:  [phone]  Name: SHELDA TRUBY MRN: [id number] Date of Birth: [birth year]

## 2021-04-13 ENCOUNTER — Other Ambulatory Visit (HOSPITAL_COMMUNITY): Payer: Self-pay

## 2021-04-13 ENCOUNTER — Encounter (HOSPITAL_COMMUNITY): Payer: Self-pay

## 2021-04-13 NOTE — Progress Notes (Signed)
Had a home visit with Dala this morning.  She states been doing ok except her back been bothering her.  She has been taking PT.  She states still can not do much around the home.  She states other than that been feeling good.  She is taking a trip in May looking forward to.  She lives with her husband that takes care of her.  She tries to stay busy around the home.  She has other family members close by for support.  She has everything for daily living.  She has all her medications and aware of how to take them.  She is aware of calling in entresto and farxiga for refills.  She watches high sodium foods and how much fluids she is intaking.  She weighs daily.  She states she gained 9 lbs over the holiday but she had no shortness of breath or edema.  She states she ate too much at dinners.  She states it is coming back off.  She denies any chest pain, headaches, dizziness or increased shortness of breath.  Lungs are clear.  Will continue to visit for heart failure, diet and medication compliance.   [place] [phone]

## 2021-04-14 ENCOUNTER — Other Ambulatory Visit: Payer: Self-pay

## 2021-04-14 ENCOUNTER — Ambulatory Visit: Payer: Medicare Other | Admitting: Physical Therapy

## 2021-04-14 ENCOUNTER — Encounter: Payer: Self-pay | Admitting: Physical Therapy

## 2021-04-14 DIAGNOSIS — M5441 Lumbago with sciatica, right side: Secondary | ICD-10-CM | POA: Diagnosis not present

## 2021-04-14 DIAGNOSIS — M545 Low back pain, unspecified: Secondary | ICD-10-CM | POA: Diagnosis not present

## 2021-04-14 DIAGNOSIS — G8929 Other chronic pain: Secondary | ICD-10-CM

## 2021-04-14 NOTE — Therapy (Signed)
[place] [facility] [address], [place], [address] Phone: [phone]   Fax:  [phone]  Physical Therapy Treatment  Patient Details  Name: Stacey [name] MRN: [id number] Date of Birth: [birth year] Referring Provider (PT): Abernathy NP   Encounter Date: 04/14/2021   PT End of Session - 04/14/21 1056     Visit Number 5    Number of Visits 17    Date for PT Re-Evaluation 05/27/21    Authorization - Visit Number 5    Authorization - Number of Visits 10    PT Start Time 2694    PT Stop Time 1030    PT Time Calculation (min) 35 min    Activity Tolerance Patient tolerated treatment well    Behavior During Therapy [facility] for tasks assessed/performed             Past Medical History:  Diagnosis Date   CHF (congestive heart failure) ([place])    Chronic kidney disease    COPD (chronic obstructive pulmonary disease) ([place])    Hypertension    Sarcoidosis    Thyroid disease     Past Surgical History:  Procedure Laterality Date   CATARACT EXTRACTION     EYE SURGERY Right 07/09/2019   GALLBLADDER SURGERY     KNEE SURGERY     thyroidectomy      There were no vitals filed for this visit.   Subjective Assessment - 04/14/21 0954     Subjective pnt reports feeling pretty good with 5/10 NPS.                  Therex NuStep for 5 min to increase lumbosacral mobility LTR x12  Glute Bridges 3 x 8 - min cueing for TA activation  Standing hip abduction 3 x 12- mod cueing to maintain sequencing and form Sit to stands 3 x 6                          PT Education - 04/14/21 1054     Education Details therex form and HEP    Person(s) Educated Patient    Methods Explanation;Demonstration    Comprehension Verbalized understanding;Returned demonstration              PT Short Term Goals - 04/07/21 1215       PT SHORT TERM GOAL #1   Title Pt will be independent with HEP in order  to improve strength and decrease back pain in order to improve pain-free function at home and work.    Baseline 03/29/21 HEP given    Time 4    Period Weeks    Status New               PT Long Term Goals - 04/07/21 1216       PT LONG TERM GOAL #1   Title Pt will decrease worst back pain as reported on NPRS by at least 2 points in order to demonstrate clinically significant reduction in back pain.    Baseline 04/08/21 10/10 LBP with activity    Time 8    Period Weeks    Status New      PT LONG TERM GOAL #2   Title Pt will decrease 5TSTS to at least 12 seconds in order to demonstrate clinically significant improvement in LE strength for age-matched strength norms    Baseline 03/29/21 18sec    Time 8  Period Weeks    Status New      PT LONG TERM GOAL #3   Title Pt will increase 10MWT to at least 1.0 m/s in order to demonstrate community ambulation speed with less likelihood of an adverse event    Baseline 03/29/21 self selected:0.35m/s fastest: 0.75m/s    Time 8    Period Weeks    Status New      PT LONG TERM GOAL #4   Title Patient will increase FOTO score to 49 to demonstrate predicted increase in functional mobility to complete ADLs    Baseline 03/29/21 40    Time 8    Period Weeks    Status New                   Plan - 04/14/21 1056     Clinical Impression Statement Session shortened d/t patient arriving late and needing to leave early for another appt. SPT progressed therex to include hip abd endurance with min cueing on form and no increase in pnt pain. SPT reviewed HEP and lifitng biomechanics; pnt demonstrated understanding and maintained good motivation throughout session. PT will increase as able.    Personal Factors and Comorbidities Comorbidity 3+;Past/Current Experience;Time since onset of injury/illness/exacerbation;Fitness    Comorbidities COPD, CHF, HTN, CKD    Examination-Activity Limitations Bed Mobility;Carry;Squat;Lift;Reach  Overhead;Stand;Stairs;Transfers    Examination-Participation Restrictions Community Activity;Meal Prep;Laundry;Cleaning;Yard Work    Merchant navy officer Evolving/Moderate complexity    Clinical Decision Making Moderate    Rehab Potential Good    PT Frequency 2x / week    PT Duration 8 weeks    PT Treatment/Interventions Electrical Stimulation;Moist Heat;Traction;Ultrasound;Gait training;Therapeutic activities;Neuromuscular re-education;Manual techniques;Spinal Manipulations;ADLs/Self Care Home Management;Passive range of motion;Therapeutic exercise;Functional mobility training;Stair training;Cryotherapy;Iontophoresis 4mg /ml Dexamethasone;Aquatic Therapy;DME Instruction;Balance training;Patient/family education;Dry needling;Joint Manipulations;Splinting;Taping    PT Next Visit Plan HEP review, 6MWT    PT Home Exercise Plan 5xSTS, supine piriformis and SKTC stretch    Consulted and Agree with Plan of Care Patient             Patient will benefit from skilled therapeutic intervention in order to improve the following deficits and impairments:  Decreased balance, Abnormal gait, Decreased activity tolerance, Decreased endurance, Decreased range of motion, Decreased strength, Increased fascial restricitons, Improper body mechanics, Pain, Postural dysfunction, Decreased coordination, Decreased mobility, Difficulty walking, Hypomobility, Increased muscle spasms, Impaired flexibility, Impaired tone  Visit Diagnosis: Chronic right-sided low back pain, unspecified whether sciatica present     Problem List Patient Active Problem List   Diagnosis Date Noted   AKI (acute kidney injury) ([place]) 07/19/2020   UTI (urinary tract infection) 07/18/2020   H/O urinary retention 07/18/2020   Hyponatremia 07/18/2020   Acute on chronic combined systolic and diastolic CHF (congestive heart failure) ([place]) 07/12/2020   Hypertensive emergency 07/12/2020   Elevated troponin 07/12/2020    Hypothyroidism 07/12/2020   Neuralgia and neuritis 03/14/2020   Primary generalized (osteo)arthritis 11/30/2019   Encounter for screening mammogram for malignant neoplasm of breast 11/30/2019   Vasomotor rhinitis 06/28/2019   Atopic dermatitis 06/28/2019   Localized superficial swelling, mass, or lump 06/28/2019   Plantar neuroma of left foot 06/28/2019   Chronic obstructive pulmonary disease ([place]) 11/03/2018   Right flank pain 11/03/2018   Dysuria 11/03/2018   Acute upper respiratory infection 05/06/2018   Acquired hypothyroidism 03/18/2018   CKD (chronic kidney disease), stage IIIa 03/18/2018   ARF (acute renal failure) ([place]) 01/14/2018   Hypertensive crisis 01/14/2018   Acute respiratory failure with hypoxia (  [place]) 01/13/2018   Encounter for general adult medical examination with abnormal findings 10/26/2017   Allergic rhinitis 10/26/2017   Vitamin D deficiency 10/26/2017   Gastroesophageal reflux disease without esophagitis 10/26/2017   Essential hypertension 04/16/2017   Thyroid disease 04/16/2017   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 09/15/2016   Dilated cardiomyopathy ([place]) 06/27/2016   Bilateral carotid artery stenosis 05/30/2016   SOBOE (shortness of breath on exertion) 05/30/2016   Hyperparathyroidism, primary ([place]) 08/25/2015   Hypercalcemia 08/12/2014   LVH (left ventricular hypertrophy) due to hypertensive disease, without heart failure 07/09/2014   Moderate mitral insufficiency 07/09/2014   Benign essential hypertension 07/06/2014   Osteoporosis, 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   Mixed hyperlipidemia 01/08/2014   Chronic pelvic pain in female 05/31/2011   Fibroids 05/31/2011   Sarcoidosis 05/31/2011     Durwin Reges DPT Claiborne Billings O'Daniel, SPT Durwin Reges, PT 04/15/2021, 10:22 AM  [place] [facility] [address] [place], [address] Phone: [phone]   Fax:  [phone]  Name:  Stacey [name] MRN: [id number] Date of Birth: Apr 06, 1945

## 2021-04-17 DIAGNOSIS — I10 Essential (primary) hypertension: Secondary | ICD-10-CM | POA: Diagnosis not present

## 2021-04-17 DIAGNOSIS — E782 Mixed hyperlipidemia: Secondary | ICD-10-CM | POA: Diagnosis not present

## 2021-04-19 ENCOUNTER — Ambulatory Visit: Payer: Medicare Other | Admitting: Physical Therapy

## 2021-04-20 ENCOUNTER — Other Ambulatory Visit: Payer: Self-pay

## 2021-04-20 ENCOUNTER — Ambulatory Visit (INDEPENDENT_AMBULATORY_CARE_PROVIDER_SITE_OTHER): Payer: Medicare Other | Admitting: Nurse Practitioner

## 2021-04-20 ENCOUNTER — Encounter: Payer: Self-pay | Admitting: Nurse Practitioner

## 2021-04-20 VITALS — BP 130/72 | HR 59 | Temp 98.1°F | Resp 16 | Ht <= 58 in | Wt 141.0 lb

## 2021-04-20 DIAGNOSIS — Z23 Encounter for immunization: Secondary | ICD-10-CM | POA: Diagnosis not present

## 2021-04-20 DIAGNOSIS — M722 Plantar fascial fibromatosis: Secondary | ICD-10-CM

## 2021-04-20 DIAGNOSIS — L2389 Allergic contact dermatitis due to other agents: Secondary | ICD-10-CM | POA: Diagnosis not present

## 2021-04-20 MED ORDER — MELOXICAM 15 MG PO TABS: 15.0000 mg | ORAL_TABLET | Freq: Every day | ORAL | 0 refills | Status: DC

## 2021-04-20 MED ORDER — ZOSTER VAC RECOMB ADJUVANTED 50 MCG/0.5ML IM SUSR: 0.5000 mL | Freq: Once | INTRAMUSCULAR | 0 refills | Status: AC

## 2021-04-20 MED ORDER — TRIAMCINOLONE ACETONIDE 0.1 % EX OINT: 1.0000 "application " | TOPICAL_OINTMENT | Freq: Two times a day (BID) | CUTANEOUS | 2 refills | Status: AC

## 2021-04-20 NOTE — Progress Notes (Signed)
[facility] [place], [place] [address]  Internal MEDICINE  Office Visit Note  Patient Name: Stacey [name]  [id number]  [id number]  Date of Service: 04/20/2021  Chief Complaint  Patient presents with   Acute Visit    Right foot pain, needs cream for skin, itchy bumps have come back      HPI Stacey [name] presents for an acute sick visit for having right foot pain that is most likely related plantar fasciitis. Patient does have a history of having a left plantar neuroma.  She has a small area of a rash and reports that it is itchy.  She has used a mild steroid cream in the past topically.  She is also in need of the shingles vaccine.  She has had shingles in the past.   Current Medication:  Outpatient Encounter Medications as of 04/20/2021  Medication Sig   albuterol (VENTOLIN HFA) 108 (90 Base) MCG/ACT inhaler Inhale into the lungs every 6 (six) hours as needed for wheezing or shortness of breath.   carvedilol (COREG) 6.25 MG tablet Take 1 tablet (6.25 mg total) by mouth 2 (two) times daily with a meal.   cetirizine (ZYRTEC) 10 MG tablet Take 1 tablet (10 mg total) by mouth daily.   cloNIDine (CATAPRES) 0.1 MG tablet Take 1 tablet (0.1 mg total) by mouth 2 (two) times daily.   dapagliflozin propanediol (FARXIGA) 10 MG TABS tablet Take 1 tablet (10 mg total) by mouth daily before breakfast.   fluticasone (FLONASE) 50 MCG/ACT nasal spray Place 2 sprays into both nostrils daily.   fluticasone (FLOVENT HFA) 110 MCG/ACT inhaler Inhale 1 puff into the lungs 2 (two) times daily.   furosemide (LASIX) 20 MG tablet Take 2 tablets (40 mg total) by mouth daily.   gabapentin (NEURONTIN) 100 MG capsule TAKE 2 CAPSULES(200 MG) BY MOUTH AT BEDTIME   hydrALAZINE (APRESOLINE) 50 MG tablet TAKE 2 TABLET BY MOUTH THREE TIMES DAILY   latanoprost (XALATAN) 0.005 % ophthalmic solution Place 1 drop into both eyes at bedtime.    levothyroxine (SYNTHROID, LEVOTHROID) 88 MCG tablet Take 88  mcg by mouth daily before breakfast.    MELATONIN CR PO Take by mouth.   meloxicam (MOBIC) 15 MG tablet Take 1 tablet (15 mg total) by mouth daily.   montelukast (SINGULAIR) 10 MG tablet Take 1 tablet (10 mg total) by mouth daily.   Multiple Vitamins-Minerals (MULTIVITAMIN GUMMIES WOMENS) CHEW Chew 2 tablets by mouth daily.   naproxen (NAPROSYN) 500 MG tablet Take 500 mg by mouth daily as needed.   neomycin-polymyxin b-dexamethasone (MAXITROL) 3.5-10000-0.1 SUSP Place into the left eye.   ondansetron (ZOFRAN ODT) 4 MG disintegrating tablet Take 1 tablet (4 mg total) by mouth every 8 (eight) hours as needed.   pantoprazole (PROTONIX) 40 MG tablet TAKE 1 TABLET BY MOUTH DAILY AS NEEDED   pravastatin (PRAVACHOL) 20 MG tablet Take 1 tablet (20 mg total) by mouth at bedtime.   sacubitril-valsartan (ENTRESTO) 24-26 MG Take 1 tablet by mouth 2 (two) times daily.   timolol (TIMOPTIC) 0.25 % ophthalmic solution Place 1 drop into both eyes 2 (two) times daily.    triamcinolone ointment (KENALOG) 0.1 % Apply 1 application topically 2 (two) times daily.   Zoster Vaccine Adjuvanted [facility]) injection Inject 0.5 mLs into the muscle once for 1 dose.   [DISCONTINUED] cyclobenzaprine (FLEXERIL) 10 MG tablet Take 1 tablet (10 mg total) by mouth daily as needed for muscle spasms. (Patient not taking: Reported on 04/05/2021)  No facility-administered encounter medications on file as of 04/20/2021.      Medical History: Past Medical History:  Diagnosis Date   CHF (congestive heart failure) ([facility])    Chronic kidney disease    COPD (chronic obstructive pulmonary disease) ([facility])    Hypertension    Sarcoidosis    Thyroid disease      Vital Signs: BP 130/72    Pulse (!) 59    Temp 98.1 F (36.7 C)    Resp 16    Ht 4\' 9"  (1.448 m)    Wt 141 lb (64 kg)    SpO2 98%    BMI 30.51 kg/m    Review of Systems  Constitutional:  Negative for chills, fatigue and unexpected weight change.  HENT:  Negative for  congestion, rhinorrhea, sneezing and sore throat.   Eyes:  Negative for redness.  Respiratory:  Negative for cough, chest tightness and shortness of breath.   Cardiovascular:  Negative for chest pain and palpitations.  Gastrointestinal:  Negative for abdominal pain, constipation, diarrhea, nausea and vomiting.  Genitourinary:  Negative for dysuria and frequency.  Musculoskeletal:  Positive for arthralgias. Negative for back pain, joint swelling and neck pain.  Skin:  Positive for rash.  Neurological: Negative.  Negative for tremors and numbness.  Hematological:  Negative for adenopathy. Does not bruise/bleed easily.  Psychiatric/Behavioral:  Negative for behavioral problems (Depression), sleep disturbance and suicidal ideas. The patient is not nervous/anxious.    Physical Exam Vitals reviewed.  Constitutional:      General: She is not in acute distress.    Appearance: Normal appearance. She is obese. She is not ill-appearing.  HENT:     Head: Normocephalic and atraumatic.  Eyes:     Pupils: Pupils are equal, round, and reactive to light.  Cardiovascular:     Rate and Rhythm: Normal rate and regular rhythm.  Pulmonary:     Effort: Pulmonary effort is normal. No respiratory distress.  Musculoskeletal:     Left foot: Tenderness (heel and arch) present.  Neurological:     Mental Status: She is alert and oriented to person, place, and time.  Psychiatric:        Mood and Affect: Mood normal.        Behavior: Behavior normal.      Assessment/Plan: 1. Plantar fasciitis of right foot Meloxicam prescribed to alleviate pain and decrease inflammation.  Patient provided with information about plantar fasciitis and a handout detailing stretches and exercises that will help improve the pain.  Patient also encouraged to utilize nonpharmacological interventions including applying ice to decrease inflammation as well as applying heat prior to stretching or doing exercises. - meloxicam (MOBIC) 15  MG tablet; Take 1 tablet (15 mg total) by mouth daily.  Dispense: 30 tablet; Refill: 0  2. Allergic contact dermatitis due to other agents Triamcinolone ointment prescribed to treat small area of rash on back - triamcinolone ointment (KENALOG) 0.1 %; Apply 1 application topically 2 (two) times daily.  Dispense: 30 g; Refill: 2  3. Need for vaccination - Zoster Vaccine Adjuvanted [facility]) injection; Inject 0.5 mLs into the muscle once for 1 dose.  Dispense: 0.5 mL; Refill: 0   General Counseling: Stacey verbalizes understanding of the findings of todays visit and agrees with plan of treatment. I have discussed any further diagnostic evaluation that may be needed or ordered today. We also reviewed her medications today. she has been encouraged to call the office with any questions or concerns that should arise  related to todays visit.    Counseling:    No orders of the defined types were placed in this encounter.   Meds ordered this encounter  Medications   triamcinolone ointment (KENALOG) 0.1 %    Sig: Apply 1 application topically 2 (two) times daily.    Dispense:  30 g    Refill:  2   meloxicam (MOBIC) 15 MG tablet    Sig: Take 1 tablet (15 mg total) by mouth daily.    Dispense:  30 tablet    Refill:  0   Zoster Vaccine Adjuvanted [facility]) injection    Sig: Inject 0.5 mLs into the muscle once for 1 dose.    Dispense:  0.5 mL    Refill:  0    Return if symptoms worsen or fail to improve.  [place] Controlled Substance Database was reviewed by me for overdose risk score (ORS)  Time spent:30 Minutes Time spent with patient included reviewing progress notes, labs, imaging studies, and discussing plan for follow up.   This patient was seen by Jonetta Osgood, FNP-C in collaboration with Dr. Clayborn Bigness as a part of collaborative care agreement.  Finbar Nippert R. Valetta Fuller, MSN, FNP-C Internal Medicine

## 2021-04-21 ENCOUNTER — Ambulatory Visit: Payer: Medicare Other | Admitting: Physical Therapy

## 2021-04-21 DIAGNOSIS — I10 Essential (primary) hypertension: Secondary | ICD-10-CM | POA: Diagnosis not present

## 2021-04-21 DIAGNOSIS — E118 Type 2 diabetes mellitus with unspecified complications: Secondary | ICD-10-CM | POA: Diagnosis not present

## 2021-04-21 DIAGNOSIS — N189 Chronic kidney disease, unspecified: Secondary | ICD-10-CM | POA: Diagnosis not present

## 2021-04-21 DIAGNOSIS — E785 Hyperlipidemia, unspecified: Secondary | ICD-10-CM | POA: Diagnosis not present

## 2021-04-26 ENCOUNTER — Ambulatory Visit: Payer: Medicare Other | Admitting: Physical Therapy

## 2021-04-26 ENCOUNTER — Telehealth: Payer: Self-pay | Admitting: Family

## 2021-04-26 NOTE — Telephone Encounter (Signed)
LVM with patient requesting proof of income for everyone In patients household in order to get her approved for [employer] patient assistance for [employer].   Scott Fix, NT

## 2021-04-28 ENCOUNTER — Encounter: Payer: Self-pay | Admitting: Physical Therapy

## 2021-04-28 ENCOUNTER — Ambulatory Visit: Payer: Medicare Other | Attending: Nurse Practitioner | Admitting: Physical Therapy

## 2021-04-28 DIAGNOSIS — G8929 Other chronic pain: Secondary | ICD-10-CM | POA: Insufficient documentation

## 2021-04-28 DIAGNOSIS — M5451 Vertebrogenic low back pain: Secondary | ICD-10-CM | POA: Diagnosis not present

## 2021-04-28 DIAGNOSIS — M545 Low back pain, unspecified: Secondary | ICD-10-CM | POA: Insufficient documentation

## 2021-04-28 NOTE — Therapy (Signed)
[place] [facility] [address] [place], [address] Phone: [phone]   Fax:  [phone]  Physical Therapy Treatment  Patient Details  Name: Stacey [name] MRN: [id number] Date of Birth: March 27, 1945 Referring Provider (PT): Abernathy NP   Encounter Date: 04/28/2021   PT End of Session - 04/28/21 1354     Visit Number 6    Number of Visits 17    Date for PT Re-Evaluation 05/27/21    Authorization - Visit Number 6    Authorization - Number of Visits 10    PT Start Time 1120    PT Stop Time 1202    PT Time Calculation (min) 42 min    Activity Tolerance Patient tolerated treatment well    Behavior During Therapy [facility] for tasks assessed/performed             Past Medical History:  Diagnosis Date   CHF (congestive heart failure) ([place])    Chronic kidney disease    COPD (chronic obstructive pulmonary disease) ([place])    Hypertension    Sarcoidosis    Thyroid disease     Past Surgical History:  Procedure Laterality Date   CATARACT EXTRACTION     EYE SURGERY Right 07/09/2019   GALLBLADDER SURGERY     KNEE SURGERY     thyroidectomy      There were no vitals filed for this visit.   Subjective Assessment - 04/28/21 1135     Subjective Patient reports she has been dealing with a plantar fasciitis in the R foot for a couple weeks and has missed PT due to this.  She reports her doctor gave her a prescription for meloxicam for this. Reports she has not been doing her HEP for her back, and has been laying around mostly. She reports she should be completing her laying down therex, but has not. Has minimal LBP today                  Therex  Nu step seat 4 UE 10 L1 for gentle lumbar spine mobility and strengthening LTR 2 x12 Supine Marches 3 x 8- mod cueing for sequencing Glute bridges 3 x 8 min cuing for maintaining neutral lumbar spine throughout exercise Bilateral Side lying abduction 3 x 12 - mod  tactile cueing for keeping hips from rolling Double knee to chest x12  Pnt educated on HEP                        PT Education - 04/28/21 1354     Education Details therex sequencing and HEP    Person(s) Educated Patient    Methods Explanation;Demonstration    Comprehension Verbalized understanding;Returned demonstration              PT Short Term Goals - 04/07/21 1215       PT SHORT TERM GOAL #1   Title Pt will be independent with HEP in order to improve strength and decrease back pain in order to improve pain-free function at home and work.    Baseline 03/29/21 HEP given    Time 4    Period Weeks    Status New               PT Long Term Goals - 04/07/21 1216       PT LONG TERM GOAL #1   Title Pt will decrease worst back pain as reported on NPRS by at  least 2 points in order to demonstrate clinically significant reduction in back pain.    Baseline 04/08/21 10/10 LBP with activity    Time 8    Period Weeks    Status New      PT LONG TERM GOAL #2   Title Pt will decrease 5TSTS to at least 12 seconds in order to demonstrate clinically significant improvement in LE strength for age-matched strength norms    Baseline 03/29/21 18sec    Time 8    Period Weeks    Status New      PT LONG TERM GOAL #3   Title Pt will increase 10MWT to at least 1.0 m/s in order to demonstrate community ambulation speed with less likelihood of an adverse event    Baseline 03/29/21 self selected:0.22m/s fastest: 0.65m/s    Time 8    Period Weeks    Status New      PT LONG TERM GOAL #4   Title Patient will increase FOTO score to 49 to demonstrate predicted increase in functional mobility to complete ADLs    Baseline 03/29/21 40    Time 8    Period Weeks    Status New                   Plan - 04/28/21 1355     Clinical Impression Statement pnt came in tired with foot pain, and spt gave therex hip abd for endurance with mod cueing on form. pnt required  multiple rest breaks due to fatigue, but was able to comply with mod tactile and verbal cueing. pnt maintained good motivation throughout session and demonstrated understanding of spt instruction. spt reviewed HEP and pnt verbalized understanding. PT will progress as able.    Personal Factors and Comorbidities Comorbidity 3+;Past/Current Experience;Time since onset of injury/illness/exacerbation;Fitness    Comorbidities COPD, CHF, HTN, CKD    Examination-Activity Limitations Bed Mobility;Carry;Squat;Lift;Reach Overhead;Stand;Stairs;Transfers    Examination-Participation Restrictions Community Activity;Meal Prep;Laundry;Cleaning;Yard Work    Merchant navy officer Evolving/Moderate complexity    Clinical Decision Making Moderate    Rehab Potential Good    PT Frequency 2x / week    PT Duration 8 weeks    PT Treatment/Interventions Electrical Stimulation;Moist Heat;Traction;Ultrasound;Gait training;Therapeutic activities;Neuromuscular re-education;Manual techniques;Spinal Manipulations;ADLs/Self Care Home Management;Passive range of motion;Therapeutic exercise;Functional mobility training;Stair training;Cryotherapy;Iontophoresis 4mg /ml Dexamethasone;Aquatic Therapy;DME Instruction;Balance training;Patient/family education;Dry needling;Joint Manipulations;Splinting;Taping    PT Next Visit Plan HEP review, 6MWT    PT Home Exercise Plan 5xSTS, supine piriformis and SKTC stretch    Consulted and Agree with Plan of Care Patient             Patient will benefit from skilled therapeutic intervention in order to improve the following deficits and impairments:  Decreased balance, Abnormal gait, Decreased activity tolerance, Decreased endurance, Decreased range of motion, Decreased strength, Increased fascial restricitons, Improper body mechanics, Pain, Postural dysfunction, Decreased coordination, Decreased mobility, Difficulty walking, Hypomobility, Increased muscle spasms, Impaired  flexibility, Impaired tone  Visit Diagnosis: Chronic right-sided low back pain, unspecified whether sciatica present     Problem List Patient Active Problem List   Diagnosis Date Noted   AKI (acute kidney injury) ([place]) 07/19/2020   UTI (urinary tract infection) 07/18/2020   H/O urinary retention 07/18/2020   Hyponatremia 07/18/2020   Acute on chronic combined systolic and diastolic CHF (congestive heart failure) ([place]) 07/12/2020   Hypertensive emergency 07/12/2020   Elevated troponin 07/12/2020   Hypothyroidism 07/12/2020   Neuralgia and neuritis 03/14/2020   Primary generalized (osteo)arthritis 11/30/2019  Encounter for screening mammogram for malignant neoplasm of breast 11/30/2019   Vasomotor rhinitis 06/28/2019   Atopic dermatitis 06/28/2019   Localized superficial swelling, mass, or lump 06/28/2019   Plantar neuroma of left foot 06/28/2019   Chronic obstructive pulmonary disease ([place]) 11/03/2018   Right flank pain 11/03/2018   Dysuria 11/03/2018   Acute upper respiratory infection 05/06/2018   Acquired hypothyroidism 03/18/2018   CKD (chronic kidney disease), stage IIIa 03/18/2018   ARF (acute renal failure) ([place]) 01/14/2018   Hypertensive crisis 01/14/2018   Acute respiratory failure with hypoxia ([place]) 01/13/2018   Encounter for general adult medical examination with abnormal findings 10/26/2017   Allergic rhinitis 10/26/2017   Vitamin D deficiency 10/26/2017   Gastroesophageal reflux disease without esophagitis 10/26/2017   Essential hypertension 04/16/2017   Thyroid disease 04/16/2017   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 09/15/2016   Dilated cardiomyopathy ([place]) 06/27/2016   Bilateral carotid artery stenosis 05/30/2016   SOBOE (shortness of breath on exertion) 05/30/2016   Hyperparathyroidism, primary ([place]) 08/25/2015   Hypercalcemia 08/12/2014   LVH (left ventricular hypertrophy) due to hypertensive disease, without heart failure 07/09/2014    Moderate mitral insufficiency 07/09/2014   Benign essential hypertension 07/06/2014   Osteoporosis, 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   Mixed hyperlipidemia 01/08/2014   Chronic pelvic pain in female 05/31/2011   Fibroids 05/31/2011   Sarcoidosis 05/31/2011     Durwin Reges DPT Claiborne Billings O'Daniel, SPT Durwin Reges, PT 04/29/2021, 8:16 AM  [place] [facility] [address] [place], [address] Phone: [phone]   Fax:  [phone]  Name: Stacey [name] MRN: [id number] Date of Birth: 07/28/1945

## 2021-04-29 ENCOUNTER — Encounter: Payer: Self-pay | Admitting: Physician Assistant

## 2021-04-29 ENCOUNTER — Telehealth (INDEPENDENT_AMBULATORY_CARE_PROVIDER_SITE_OTHER): Payer: Medicare Other | Admitting: Physician Assistant

## 2021-04-29 VITALS — Resp 16 | Ht <= 58 in

## 2021-04-29 DIAGNOSIS — J449 Chronic obstructive pulmonary disease, unspecified: Secondary | ICD-10-CM | POA: Diagnosis not present

## 2021-04-29 DIAGNOSIS — J069 Acute upper respiratory infection, unspecified: Secondary | ICD-10-CM | POA: Diagnosis not present

## 2021-04-29 MED ORDER — AZITHROMYCIN 250 MG PO TABS: ORAL_TABLET | ORAL | 0 refills | Status: DC

## 2021-04-29 NOTE — Progress Notes (Signed)
[facility] [place], [place] [address]  Internal MEDICINE  Telephone Visit  Patient Name: Stacey [name]  [id number]  [id number]  Date of Service: 04/29/2021  I connected with the patient at 10:40 by telephone and verified the patients identity using two identifiers.   I discussed the limitations, risks, security and privacy concerns of performing an evaluation and management service by telephone and the availability of in person appointments. I also discussed with the patient that there may be a patient responsible charge related to the service.  The patient expressed understanding and agrees to proceed.    Chief Complaint  Patient presents with   Acute Visit    Runny nose - Negative for COVID   Ear Pain   Cough   Sinusitis   Telephone Assessment   Telephone Screen    [id number]    HPI Pt is here for a virtual sick visit -She has been experiencing runny nose, ear pain, cough, and sinus pressure, and a little SOB since tues. Denies wheezing, fever, or chills. No sore throat since Tues, but a little difficult to talk--hoarse voice -Has been taking cough drops, flonase, just using daily inhaler no albuterol.  -Her covid test is negative  Current Medication: Outpatient Encounter Medications as of 04/29/2021  Medication Sig   albuterol (VENTOLIN HFA) 108 (90 Base) MCG/ACT inhaler Inhale into the lungs every 6 (six) hours as needed for wheezing or shortness of breath.   azithromycin (ZITHROMAX) 250 MG tablet Take one tab a day for 10 days for uri   carvedilol (COREG) 6.25 MG tablet Take 1 tablet (6.25 mg total) by mouth 2 (two) times daily with a meal.   cetirizine (ZYRTEC) 10 MG tablet Take 1 tablet (10 mg total) by mouth daily.   cloNIDine (CATAPRES) 0.1 MG tablet Take 1 tablet (0.1 mg total) by mouth 2 (two) times daily.   dapagliflozin propanediol (FARXIGA) 10 MG TABS tablet Take 1 tablet (10 mg total) by mouth daily before breakfast.   fluticasone  (FLONASE) 50 MCG/ACT nasal spray Place 2 sprays into both nostrils daily.   fluticasone (FLOVENT HFA) 110 MCG/ACT inhaler Inhale 1 puff into the lungs 2 (two) times daily.   furosemide (LASIX) 20 MG tablet Take 2 tablets (40 mg total) by mouth daily.   gabapentin (NEURONTIN) 100 MG capsule TAKE 2 CAPSULES(200 MG) BY MOUTH AT BEDTIME   hydrALAZINE (APRESOLINE) 50 MG tablet TAKE 2 TABLET BY MOUTH THREE TIMES DAILY   latanoprost (XALATAN) 0.005 % ophthalmic solution Place 1 drop into both eyes at bedtime.    levothyroxine (SYNTHROID, LEVOTHROID) 88 MCG tablet Take 88 mcg by mouth daily before breakfast.    MELATONIN CR PO Take by mouth.   meloxicam (MOBIC) 15 MG tablet Take 1 tablet (15 mg total) by mouth daily.   montelukast (SINGULAIR) 10 MG tablet Take 1 tablet (10 mg total) by mouth daily.   Multiple Vitamins-Minerals (MULTIVITAMIN GUMMIES WOMENS) CHEW Chew 2 tablets by mouth daily.   naproxen (NAPROSYN) 500 MG tablet Take 500 mg by mouth daily as needed.   neomycin-polymyxin b-dexamethasone (MAXITROL) 3.5-10000-0.1 SUSP Place into the left eye.   ondansetron (ZOFRAN ODT) 4 MG disintegrating tablet Take 1 tablet (4 mg total) by mouth every 8 (eight) hours as needed.   pantoprazole (PROTONIX) 40 MG tablet TAKE 1 TABLET BY MOUTH DAILY AS NEEDED   pravastatin (PRAVACHOL) 20 MG tablet Take 1 tablet (20 mg total) by mouth at bedtime.   sacubitril-valsartan (ENTRESTO) 24-26 MG  Take 1 tablet by mouth 2 (two) times daily.   timolol (TIMOPTIC) 0.25 % ophthalmic solution Place 1 drop into both eyes 2 (two) times daily.    triamcinolone ointment (KENALOG) 0.1 % Apply 1 application topically 2 (two) times daily.   No facility-administered encounter medications on file as of 04/29/2021.    Surgical History: Past Surgical History:  Procedure Laterality Date   CATARACT EXTRACTION     EYE SURGERY Right 07/09/2019   GALLBLADDER SURGERY     KNEE SURGERY     thyroidectomy      Medical History: Past  Medical History:  Diagnosis Date   CHF (congestive heart failure) (HCC)    Chronic kidney disease    COPD (chronic obstructive pulmonary disease) (HCC)    Hypertension    Sarcoidosis    Thyroid disease     Family History: Family History  Problem Relation Age of Onset   Hypertension Mother    Diabetes Mother    Anuerysm Mother    Diabetes Father    Hypertension Father    Prostate cancer Neg Hx    Bladder Cancer Neg Hx    Kidney cancer Neg Hx    Breast cancer Neg Hx     Social History   Socioeconomic History   Marital status: Married    Spouse name: Not on file   Number of children: Not on file   Years of education: Not on file   Highest education level: Not on file  Occupational History   Not on file  Tobacco Use   Smoking status: Never   Smokeless tobacco: Never  Vaping Use   Vaping Use: Never used  Substance and Sexual Activity   Alcohol use: No   Drug use: Not Currently   Sexual activity: Not on file  Other Topics Concern   Not on file  Social History Narrative   Not on file   Social Determinants of Health   Financial Resource Strain: Low Risk    Difficulty of Paying Living Expenses: Not very hard  Food Insecurity: Not on file  Transportation Needs: Not on file  Physical Activity: Not on file  Stress: Not on file  Social Connections: Not on file  Intimate Partner Violence: Not on file      Review of Systems  Constitutional:  Positive for fatigue. Negative for chills and fever.  HENT:  Positive for congestion, ear pain, postnasal drip, rhinorrhea and voice change. Negative for mouth sores and sore throat.   Respiratory:  Positive for cough and shortness of breath. Negative for wheezing.   Cardiovascular:  Negative for chest pain.  Genitourinary:  Negative for flank pain.  Psychiatric/Behavioral: Negative.     Vital Signs: Resp 16    Ht 4\' 9"  (1.448 m)    BMI 30.51 kg/m    Observation/Objective:  Pt is able to carry out conversation though  voice is hoarse.   Assessment/Plan: 1. Upper respiratory tract infection, unspecified type Will start zpak and advised to take mucinex and continue flonase. Advised to rest and stay well hydrated and try warm tea with honey, soup, or lozenges to soothe throat. - azithromycin (ZITHROMAX) 250 MG tablet; Take one tab a day for 10 days for uri  Dispense: 10 tablet; Refill: 0  2. Chronic obstructive pulmonary disease, unspecified COPD type ([place]) Continue inhalers as prescribed and as indicated.    General Counseling: shatara stanek understanding of the findings of today's phone visit and agrees with plan of treatment. I have discussed  any further diagnostic evaluation that may be needed or ordered today. We also reviewed her medications today. she has been encouraged to call the office with any questions or concerns that should arise related to todays visit.    No orders of the defined types were placed in this encounter.   Meds ordered this encounter  Medications   azithromycin (ZITHROMAX) 250 MG tablet    Sig: Take one tab a day for 10 days for uri    Dispense:  10 tablet    Refill:  0    Time spent:25 Minutes    Dr [name] Internal medicine

## 2021-05-03 ENCOUNTER — Ambulatory Visit: Payer: Medicare Other | Admitting: Physical Therapy

## 2021-05-03 NOTE — Patient Instructions (Incomplete)
INTERVENTIONS  Therex  NuStep seat 4 UE 10, L1 for gentle lumbar spine mobility and strengthening, 5 minutes.  Lumbar rotation, 2x12 each side  Supine marches with TA activation, 3 x 8 each side   -multimodal cueing for sequencing Glute bridges with TA activation, 3 x 8   -min cuing for maintaining neutral lumbar spine Hip abduction in side lying, 3 x 12 bilaterally   -mod tactile cueing to keep hips from rolling Double knee to chest x12   Educated on HEP    Clinical Impression:

## 2021-05-05 ENCOUNTER — Ambulatory Visit: Payer: Medicare Other | Admitting: Physical Therapy

## 2021-05-05 DIAGNOSIS — H401133 Primary open-angle glaucoma, bilateral, severe stage: Secondary | ICD-10-CM | POA: Diagnosis not present

## 2021-05-10 ENCOUNTER — Other Ambulatory Visit: Payer: Self-pay

## 2021-05-10 ENCOUNTER — Encounter: Payer: Self-pay | Admitting: Physical Therapy

## 2021-05-10 ENCOUNTER — Ambulatory Visit: Payer: Medicare Other | Admitting: Physical Therapy

## 2021-05-10 DIAGNOSIS — I6523 Occlusion and stenosis of bilateral carotid arteries: Secondary | ICD-10-CM | POA: Diagnosis not present

## 2021-05-10 DIAGNOSIS — E782 Mixed hyperlipidemia: Secondary | ICD-10-CM | POA: Diagnosis not present

## 2021-05-10 DIAGNOSIS — I119 Hypertensive heart disease without heart failure: Secondary | ICD-10-CM | POA: Diagnosis not present

## 2021-05-10 DIAGNOSIS — G8929 Other chronic pain: Secondary | ICD-10-CM | POA: Diagnosis not present

## 2021-05-10 DIAGNOSIS — I10 Essential (primary) hypertension: Secondary | ICD-10-CM | POA: Diagnosis not present

## 2021-05-10 DIAGNOSIS — I5043 Acute on chronic combined systolic (congestive) and diastolic (congestive) heart failure: Secondary | ICD-10-CM | POA: Diagnosis not present

## 2021-05-10 DIAGNOSIS — M5451 Vertebrogenic low back pain: Secondary | ICD-10-CM | POA: Diagnosis not present

## 2021-05-10 DIAGNOSIS — I340 Nonrheumatic mitral (valve) insufficiency: Secondary | ICD-10-CM | POA: Diagnosis not present

## 2021-05-10 DIAGNOSIS — D8685 Sarcoid myocarditis: Secondary | ICD-10-CM | POA: Diagnosis not present

## 2021-05-10 DIAGNOSIS — M545 Low back pain, unspecified: Secondary | ICD-10-CM

## 2021-05-10 DIAGNOSIS — J449 Chronic obstructive pulmonary disease, unspecified: Secondary | ICD-10-CM | POA: Diagnosis not present

## 2021-05-10 NOTE — Therapy (Signed)
[facility] [address] [place], [address] Phone: [phone]   Fax:  [phone]  Physical Therapy Treatment  Patient Details  Name: Stacey [name] MRN: [id number] Date of Birth: 04/22/1945 Referring Provider (PT): Abernathy NP   Encounter Date: 05/10/2021   PT End of Session - 05/10/21 1125     Visit Number 7    Number of Visits 17    Date for PT Re-Evaluation 05/27/21    Authorization - Visit Number 6    Authorization - Number of Visits 10    PT Start Time 1120    PT Stop Time 1200    PT Time Calculation (min) 40 min    Activity Tolerance Patient tolerated treatment well    Behavior During Therapy [facility] for tasks assessed/performed             Past Medical History:  Diagnosis Date   CHF (congestive heart failure) ([place])    Chronic kidney disease    COPD (chronic obstructive pulmonary disease) ([place])    Hypertension    Sarcoidosis    Thyroid disease     Past Surgical History:  Procedure Laterality Date   CATARACT EXTRACTION     EYE SURGERY Right 07/09/2019   GALLBLADDER SURGERY     KNEE SURGERY     thyroidectomy      There were no vitals filed for this visit.   Subjective Assessment - 05/10/21 1122     Subjective Pt states she just left her appointment with the cardiologist - she has a echocardiogram scheduled. Denies pain this date. States she continues to experience severe back pain after prolonged activity and dance lessons.    Currently in Pain? No/denies              Therex  NuStep, seat 4 and UE 10, Level 1 for gentle lumbar spine mobility and strengthening. Thoracolumbar rotation, 2 x12 each Glute bridges 3 x 10, ball between knees to assist with hip and lumbar stability  Posterior pelvic tilt x10 Supine marches w/ TA activation 3 x 10 Supine w/ BLE elevated isometric hold, hips and knees at 90-90, 2 x 30 seconds  Standing hip abduction YTB 2x10    Manual  Therapy Hamstring stretch x60 seconds each Single knee to chest x60 seconds each Long axis distraction x30 seconds each  Double knee to chest x60 seconds     Clinical Impression: Pt pleasant during session. POC was progressed with additional core activation exercises and a standing exercise. Stretching also added to interventions with mild posterior chain tightness. She activates TA well however fatigue quickly. Spinal extension during bridging caused some discomfort. PT would like to add more standing exercises to list of interventions next session. Patient will continue to benefit from skilled PT to address weakness and pain deficits of the low back to return to PLOF and allow pt to participate in hobbies for improved QOL.             PT Short Term Goals - 04/07/21 1215       PT SHORT TERM GOAL #1   Title Pt will be independent with HEP in order to improve strength and decrease back pain in order to improve pain-free function at home and work.    Baseline 03/29/21 HEP given    Time 4    Period Weeks    Status New  PT Long Term Goals - 04/07/21 1216       PT LONG TERM GOAL #1   Title Pt will decrease worst back pain as reported on NPRS by at least 2 points in order to demonstrate clinically significant reduction in back pain.    Baseline 04/08/21 10/10 LBP with activity    Time 8    Period Weeks    Status New      PT LONG TERM GOAL #2   Title Pt will decrease 5TSTS to at least 12 seconds in order to demonstrate clinically significant improvement in LE strength for age-matched strength norms    Baseline 03/29/21 18sec    Time 8    Period Weeks    Status New      PT LONG TERM GOAL #3   Title Pt will increase 10MWT to at least 1.0 m/s in order to demonstrate community ambulation speed with less likelihood of an adverse event    Baseline 03/29/21 self selected:0.31m/s fastest: 0.17m/s    Time 8    Period Weeks    Status New      PT LONG TERM GOAL #4    Title Patient will increase FOTO score to 49 to demonstrate predicted increase in functional mobility to complete ADLs    Baseline 03/29/21 40    Time 8    Period Weeks    Status New                   Plan - 05/10/21 1250     Clinical Impression Statement Pt pleasant during session. POC was progressed with additional core activation exercises and a standing exercise. Stretching also added to interventions with mild posterior chain tightness. She activates TA well however fatigue quickly. Spinal extension during bridging caused some discomfort. PT would like to add more standing exercises to list of interventions next session. Patient will continue to benefit from skilled PT to address weakness and pain deficits of the low back to return to PLOF and allow pt to participate in hobbies for improved QOL.    Personal Factors and Comorbidities Comorbidity 3+;Past/Current Experience;Time since onset of injury/illness/exacerbation;Fitness    Comorbidities COPD, CHF, HTN, CKD    Examination-Activity Limitations Bed Mobility;Carry;Squat;Lift;Reach Overhead;Stand;Stairs;Transfers    Examination-Participation Restrictions Community Activity;Meal Prep;Laundry;Cleaning;Yard Work    [employer] Evolving/Moderate complexity    Rehab Potential Good    PT Frequency 2x / week    PT Duration 8 weeks    PT Treatment/Interventions Electrical Stimulation;Moist Heat;Traction;Ultrasound;Gait training;Therapeutic activities;Neuromuscular re-education;Manual techniques;Spinal Manipulations;ADLs/Self Care Home Management;Passive range of motion;Therapeutic exercise;Functional mobility training;Stair training;Cryotherapy;Iontophoresis 4mg /ml Dexamethasone;Aquatic Therapy;DME Instruction;Balance training;Patient/family education;Dry needling;Joint Manipulations;Splinting;Taping    PT Next Visit Plan HEP review, 6MWT    PT Home Exercise Plan 5xSTS, supine piriformis and SKTC stretch     Consulted and Agree with Plan of Care Patient             Patient will benefit from skilled therapeutic intervention in order to improve the following deficits and impairments:  Decreased balance, Abnormal gait, Decreased activity tolerance, Decreased endurance, Decreased range of motion, Decreased strength, Increased fascial restricitons, Improper body mechanics, Pain, Postural dysfunction, Decreased coordination, Decreased mobility, Difficulty walking, Hypomobility, Increased muscle spasms, Impaired flexibility, Impaired tone  Visit Diagnosis: Chronic right-sided low back pain, unspecified whether sciatica present     Problem List Patient Active Problem List   Diagnosis Date Noted   AKI (acute kidney injury) ([place]) 07/19/2020   UTI (urinary tract infection) 07/18/2020   H/O  urinary retention 07/18/2020   Hyponatremia 07/18/2020   Acute on chronic combined systolic and diastolic CHF (congestive heart failure) ([place]) 07/12/2020   Hypertensive emergency 07/12/2020   Elevated troponin 07/12/2020   Hypothyroidism 07/12/2020   Neuralgia and neuritis 03/14/2020   Primary generalized (osteo)arthritis 11/30/2019   Encounter for screening mammogram for malignant neoplasm of breast 11/30/2019   Vasomotor rhinitis 06/28/2019   Atopic dermatitis 06/28/2019   Localized superficial swelling, mass, or lump 06/28/2019   Plantar neuroma of left foot 06/28/2019   Chronic obstructive pulmonary disease ([place]) 11/03/2018   Right flank pain 11/03/2018   Dysuria 11/03/2018   Acute upper respiratory infection 05/06/2018   Acquired hypothyroidism 03/18/2018   CKD (chronic kidney disease), stage IIIa 03/18/2018   ARF (acute renal failure) ([place]) 01/14/2018   Hypertensive crisis 01/14/2018   Acute respiratory failure with hypoxia ([place]) 01/13/2018   Encounter for general adult medical examination with abnormal findings 10/26/2017   Allergic rhinitis 10/26/2017   Vitamin D deficiency 10/26/2017    Gastroesophageal reflux disease without esophagitis 10/26/2017   Essential hypertension 04/16/2017   Thyroid disease 04/16/2017   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 09/15/2016   Dilated cardiomyopathy ([place]) 06/27/2016   Bilateral carotid artery stenosis 05/30/2016   SOBOE (shortness of breath on exertion) 05/30/2016   Hyperparathyroidism, primary ([place]) 08/25/2015   Hypercalcemia 08/12/2014   LVH (left ventricular hypertrophy) due to hypertensive disease, without heart failure 07/09/2014   Moderate mitral insufficiency 07/09/2014   Benign essential hypertension 07/06/2014   Osteoporosis, 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 [place], [address] Phone: [phone]   Fax:  [phone]  Name: Stacey [name] MRN: [id number] Date of Birth: 01/01/1946

## 2021-05-11 ENCOUNTER — Telehealth (HOSPITAL_COMMUNITY): Payer: Self-pay

## 2021-05-11 NOTE — Telephone Encounter (Signed)
Received a call from [employer] stating she had 2 Entresto pills left.  She states never received any from foundation.  Spoke with Museum/gallery conservator at HF clinic and she advised still processing because Ja has not turned in her husband income.  They have samples at the office.  Explained to Mikisha that she needs to turn her husbands income in to HF clinic to get the foundation to pay for them again this year.  Also told her she can get samples from HF clinic, she states she will fo in the morning tomorrow.    [facility] [phone]

## 2021-05-12 ENCOUNTER — Ambulatory Visit: Payer: Medicare Other | Admitting: Physical Therapy

## 2021-05-12 ENCOUNTER — Other Ambulatory Visit: Payer: Self-pay

## 2021-05-12 ENCOUNTER — Encounter: Payer: Self-pay | Admitting: Physical Therapy

## 2021-05-12 DIAGNOSIS — M5451 Vertebrogenic low back pain: Secondary | ICD-10-CM | POA: Diagnosis not present

## 2021-05-12 DIAGNOSIS — G8929 Other chronic pain: Secondary | ICD-10-CM

## 2021-05-12 DIAGNOSIS — M545 Low back pain, unspecified: Secondary | ICD-10-CM

## 2021-05-12 NOTE — Therapy (Signed)
[facility] [address], [place], [address] Phone: [phone]   Fax:  [phone]  Physical Therapy Treatment  Patient Details  Name: Stacey [name] MRN: [id number] Date of Birth: 09/16/1945 Referring Provider (PT): Abernathy NP   Encounter Date: 05/12/2021   PT End of Session - 05/12/21 1148     Visit Number 8    Number of Visits 17    Date for PT Re-Evaluation 05/27/21    Authorization - Visit Number 8    Authorization - Number of Visits 10    PT Start Time 1115    PT Stop Time 1200    PT Time Calculation (min) 45 min    Activity Tolerance Patient tolerated treatment well    Behavior During Therapy [facility] for tasks assessed/performed             Past Medical History:  Diagnosis Date   CHF (congestive heart failure) ([place])    Chronic kidney disease    COPD (chronic obstructive pulmonary disease) ([place])    Hypertension    Sarcoidosis    Thyroid disease     Past Surgical History:  Procedure Laterality Date   CATARACT EXTRACTION     EYE SURGERY Right 07/09/2019   GALLBLADDER SURGERY     KNEE SURGERY     thyroidectomy      There were no vitals filed for this visit.   Subjective Assessment - 05/12/21 1121     Subjective Pnt states she is in no pain today and being compliant with HEP    Pertinent History Pt is a 76 year old female presenting with chronic LBP >15 years with insideous onset. She reports no specific event or anything that exacerbated her pain, that she just wanted to try PT because she has been suffering with pain for so long. She has been managing her pain with medications through the pain clinic for "about 15 years" and about 3 steroid injections; no surgical intervention. Patient reports her LBP is R sided and radiates into the R buttock and down the R leg. Her back pain is described as sharp in the low back and achy pain down the legs. Denies numbness, tingling, or  electrical sensation (does report some occassional pain in L toes and L fingers). Current pain 7/10; best 0/10; worst 10/10. Pain is exacerbated by any walking/standing >76mins, cooking, bending foward, lifting, and carrying. Can reduce pain with rest, and stopping activity and taking naproxen before her pain increases. Pt completes bathing, dressing, feeding ind. She cooks but modifies this activity to decrease upright time, husband helps with heavy household chores. Is driving and completing limited community activity. Pt is retired, enjoys dancing with her church dance ministry once a month, keeping her 2 y/o great nephew. Lives at home with her husband in a one story home with 3 steps to enter with a handrail. She has a walk in shower with grab bars, denies issues navigating her home. Reports 1 fall in July 2022 when she stepped in a hole, denies fear of falling. Pt denies N/V, B&B changes, unexplained weight fluctuation, saddle paresthesia, fever, night sweats, or unrelenting night pain at this time.    Limitations Lifting;House hold activities;Standing;Walking    How long can you sit comfortably? unlimited    How long can you stand comfortably? 30mins    How long can you walk comfortably? 32mins    Diagnostic tests none    Patient Stated Goals Would  like to be able to complete walking regimen and cooking/cleaning without pain    Currently in Pain? No/denies    Pain Score 0-No pain             Therex  NuStep, seat 4 and UE 10, Level 1 for gentle lumbar spine mobility and strengthening. Thoracolumbar rotation, 3 x12 each Glute bridges 3 x 8, ball between knees to assist with hip and lumbar stability  Standing abduction 3 x 12, mod verbal cueing to not lean over railing and maintain a neutral pelvis  Seated ball roll out 2 x 12     Manual Therapy Hamstring stretch for 60 sec each side Single Knee to Chest Stretch 3 for 60 sec Long axis distraction 3 for 30 sec  10 min total                            PT Education - 05/12/21 1148     Education Details therex sequencing    Person(s) Educated Patient    Methods Explanation;Demonstration;Verbal cues    Comprehension Verbalized understanding;Returned demonstration              PT Short Term Goals - 04/07/21 1215       PT SHORT TERM GOAL #1   Title Pt will be independent with HEP in order to improve strength and decrease back pain in order to improve pain-free function at home and work.    Baseline 03/29/21 HEP given    Time 4    Period Weeks    Status New               PT Long Term Goals - 04/07/21 1216       PT LONG TERM GOAL #1   Title Pt will decrease worst back pain as reported on NPRS by at least 2 points in order to demonstrate clinically significant reduction in back pain.    Baseline 04/08/21 10/10 LBP with activity    Time 8    Period Weeks    Status New      PT LONG TERM GOAL #2   Title Pt will decrease 5TSTS to at least 12 seconds in order to demonstrate clinically significant improvement in LE strength for age-matched strength norms    Baseline 03/29/21 18sec    Time 8    Period Weeks    Status New      PT LONG TERM GOAL #3   Title Pt will increase 10MWT to at least 1.0 m/s in order to demonstrate community ambulation speed with less likelihood of an adverse event    Baseline 03/29/21 self selected:0.55m/s fastest: 0.16m/s    Time 8    Period Weeks    Status New      PT LONG TERM GOAL #4   Title Patient will increase FOTO score to 49 to demonstrate predicted increase in functional mobility to complete ADLs    Baseline 03/29/21 40    Time 8    Period Weeks    Status New                   Plan - 05/12/21 1213     Clinical Impression Statement Pnt presented with no back pain today, and SPT progressed therex to include standing hip abd for increased walking endurance. Pnt was able to comply with mod verbal cueing and required multiple breaks  throughout session due to LE fatigue. SPT performed manual therapy  to increase bilateral hip ROM and posterior kinetic chain flexibility with success. Pnt maintained good motivation throughout session, and PT will continue to progress to functional and standing movement    Personal Factors and Comorbidities Comorbidity 3+;Past/Current Experience;Time since onset of injury/illness/exacerbation;Fitness    Comorbidities COPD, CHF, HTN, CKD    Examination-Activity Limitations Bed Mobility;Carry;Squat;Lift;Reach Overhead;Stand;Stairs;Transfers    Examination-Participation Restrictions Community Activity;Meal Prep;Laundry;Cleaning;Yard Work    [employer]chant [employer] officer Evolving/Moderate complexity    Clinical Decision Making Moderate    Rehab Potential Good    PT Frequency 2x / week    PT Duration 8 weeks    PT Treatment/Interventions Electrical Stimulation;Moist Heat;Traction;Ultrasound;Gait training;Therapeutic activities;Neuromuscular re-education;Manual techniques;Spinal Manipulations;ADLs/Self Care Home Management;Passive range of motion;Therapeutic exercise;Functional mobility training;Stair training;Cryotherapy;Iontophoresis 4mg /ml Dexamethasone;Aquatic Therapy;DME Instruction;Balance training;Patient/family education;Dry needling;Joint Manipulations;Splinting;Taping    PT Next Visit Plan HEP review, 6MWT    PT Home Exercise Plan 5xSTS, supine piriformis and SKTC stretch    Consulted and Agree with Plan of Care Patient             Patient will benefit from skilled therapeutic intervention in order to improve the following deficits and impairments:  Decreased balance, Abnormal gait, Decreased activity tolerance, Decreased endurance, Decreased range of motion, Decreased strength, Increased fascial restricitons, Improper body mechanics, Pain, Postural dysfunction, Decreased coordination, Decreased mobility, Difficulty walking, Hypomobility, Increased muscle spasms, Impaired flexibility,  Impaired tone  Visit Diagnosis: Chronic right-sided low back pain, unspecified whether sciatica present     Problem List Patient Active Problem List   Diagnosis Date Noted   AKI (acute kidney injury) ([place]) 07/19/2020   UTI (urinary tract infection) 07/18/2020   H/O urinary retention 07/18/2020   Hyponatremia 07/18/2020   Acute on chronic combined systolic and diastolic CHF (congestive heart failure) ([place]) 07/12/2020   Hypertensive emergency 07/12/2020   Elevated troponin 07/12/2020   Hypothyroidism 07/12/2020   Neuralgia and neuritis 03/14/2020   Primary generalized (osteo)arthritis 11/30/2019   Encounter for screening mammogram for malignant neoplasm of breast 11/30/2019   Vasomotor rhinitis 06/28/2019   Atopic dermatitis 06/28/2019   Localized superficial swelling, mass, or lump 06/28/2019   Plantar neuroma of left foot 06/28/2019   Chronic obstructive pulmonary disease ([place]) 11/03/2018   Right flank pain 11/03/2018   Dysuria 11/03/2018   Acute upper respiratory infection 05/06/2018   Acquired hypothyroidism 03/18/2018   CKD (chronic kidney disease), stage IIIa 03/18/2018   ARF (acute renal failure) ([place]) 01/14/2018   Hypertensive crisis 01/14/2018   Acute respiratory failure with hypoxia ([place]) 01/13/2018   Encounter for general adult medical examination with abnormal findings 10/26/2017   Allergic rhinitis 10/26/2017   Vitamin D deficiency 10/26/2017   Gastroesophageal reflux disease without esophagitis 10/26/2017   Essential hypertension 04/16/2017   Thyroid disease 04/16/2017   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 09/15/2016   Dilated cardiomyopathy ([place]) 06/27/2016   Bilateral carotid artery stenosis 05/30/2016   SOBOE (shortness of breath on exertion) 05/30/2016   Hyperparathyroidism, primary ([place]) 08/25/2015   Hypercalcemia 08/12/2014   LVH (left ventricular hypertrophy) due to hypertensive disease, without heart failure 07/09/2014   Moderate mitral  insufficiency 07/09/2014   Benign essential hypertension 07/06/2014   Osteoporosis, 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   Mixed hyperlipidemia 01/08/2014   Chronic pelvic pain in female 05/31/2011   Fibroids 05/31/2011   Sarcoidosis 05/31/2011   Claiborne Billings O'Daniel, SPT  Patrina Levering PT, DPT   [facility] [address], [place], [address] Phone: [phone]   Fax:  [phone]  Name: Stacey [name] MRN: [id number] Date of Birth: 1945-12-21

## 2021-05-13 ENCOUNTER — Encounter: Payer: Self-pay | Admitting: Nurse Practitioner

## 2021-05-15 ENCOUNTER — Other Ambulatory Visit: Payer: Self-pay | Admitting: Nurse Practitioner

## 2021-05-15 DIAGNOSIS — E782 Mixed hyperlipidemia: Secondary | ICD-10-CM

## 2021-05-17 ENCOUNTER — Ambulatory Visit: Payer: Medicare Other | Admitting: Physical Therapy

## 2021-05-17 ENCOUNTER — Other Ambulatory Visit: Payer: Self-pay

## 2021-05-17 ENCOUNTER — Encounter: Payer: Self-pay | Admitting: Physical Therapy

## 2021-05-17 DIAGNOSIS — G8929 Other chronic pain: Secondary | ICD-10-CM

## 2021-05-17 DIAGNOSIS — M545 Low back pain, unspecified: Secondary | ICD-10-CM

## 2021-05-17 DIAGNOSIS — M5451 Vertebrogenic low back pain: Secondary | ICD-10-CM | POA: Diagnosis not present

## 2021-05-17 NOTE — Therapy (Signed)
[facility] [address] [place], [address] Phone: [phone]   Fax:  [phone]  Physical Therapy Treatment  Patient Details  Name: Stacey [name] MRN: [id number] Date of Birth: 12-07-1945 Referring Provider (PT): Abernathy NP   Encounter Date: 05/17/2021   PT End of Session - 05/17/21 1159     Visit Number 9    Number of Visits 17    Date for PT Re-Evaluation 05/27/21    Authorization - Visit Number 9    Authorization - Number of Visits 10    PT Start Time 1125    PT Stop Time 1200    PT Time Calculation (min) 35 min    Activity Tolerance Patient tolerated treatment well    Behavior During Therapy [facility] for tasks assessed/performed             Past Medical History:  Diagnosis Date   CHF (congestive heart failure) ([place])    Chronic kidney disease    COPD (chronic obstructive pulmonary disease) ([place])    Hypertension    Sarcoidosis    Thyroid disease     Past Surgical History:  Procedure Laterality Date   CATARACT EXTRACTION     EYE SURGERY Right 07/09/2019   GALLBLADDER SURGERY     KNEE SURGERY     thyroidectomy      There were no vitals filed for this visit.   Subjective Assessment - 05/17/21 1127     Subjective Pnt states she is in no pain and HEP is going well.    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 She reports no specific event or anything that exacerbated her pain, that she just wanted to try PT because she has been suffering with pain for so long. She has been managing her pain with medications through the pain clinic for "about 15 years" and about 3 steroid injections; no surgical intervention. Patient reports her LBP is R sided and radiates into the R buttock and down the R leg. Her back pain is described as sharp in the low back and achy pain down the legs. Denies numbness, tingling, or electrical  sensation (does report some occassional pain in L toes and L fingers). Current pain 7/10; best 0/10; worst 10/10. Pain is exacerbated by any walking/standing >20mins, cooking, bending foward, lifting, and carrying. Can reduce pain with rest, and stopping activity and taking naproxen before her pain increases. Pt completes bathing, dressing, feeding ind. She cooks but modifies this activity to decrease upright time, husband helps with heavy household chores. Is driving and completing limited community activity. Pt is retired, enjoys dancing with her church dance ministry once a month, keeping her 2 y/o great nephew. Lives at home with her husband in a one story home with 3 steps to enter with a handrail. She has a walk in shower with grab bars, denies issues navigating her home. Reports 1 fall in July 2022 when she stepped in a hole, denies fear of falling. Pt denies N/V, B&B changes, unexplained weight fluctuation, saddle paresthesia, fever, night sweats, or unrelenting night pain at this time.    Limitations Lifting;House hold activities;Standing;Walking    How long can you sit comfortably? unlimited    How long can you stand comfortably? 67mins    How long can you walk comfortably? 17mins    Diagnostic tests none    Patient Stated Goals Would like  to be able to complete walking regimen and cooking/cleaning without pain    Currently in Pain? No/denies                    Therex  NuStep, seat 3 and UE 10, Level 1 for gentle lumbar spine mobility and strengthening for 5 min.  Thoracolumbar rotation, 3 x12 each Glute bridges 3 x 10, ball between knees to add adductor activation Side Steps with YTB 2 x 12 each side, min verbal cueing to maintain neutral pelvis Figure 4 stretching 3 x 30 sec bilaterally - modified on right side with L leg extended and R leg crossed below knee Seated ball roll out 2 x 12  - min verbal cue to relax neck               PT Education - 05/17/21 1158      Education Details therex form    Person(s) Educated Patient    Methods Explanation;Demonstration;Verbal cues    Comprehension Verbalized understanding;Returned demonstration              PT Short Term Goals - 04/07/21 1215       PT SHORT TERM GOAL #1   Title Pt will be independent with HEP in order to improve strength and decrease back pain in order to improve pain-free function at home and work.    Baseline 03/29/21 HEP given    Time 4    Period Weeks    Status New               PT Long Term Goals - 04/07/21 1216       PT LONG TERM GOAL #1   Title Pt will decrease worst back pain as reported on NPRS by at least 2 points in order to demonstrate clinically significant reduction in back pain.    Baseline 04/08/21 10/10 LBP with activity    Time 8    Period Weeks    Status New      PT LONG TERM GOAL #2   Title Pt will decrease 5TSTS to at least 12 seconds in order to demonstrate clinically significant improvement in LE strength for age-matched strength norms    Baseline 03/29/21 18sec    Time 8    Period Weeks    Status New      PT LONG TERM GOAL #3   Title Pt will increase 10MWT to at least 1.0 m/s in order to demonstrate community ambulation speed with less likelihood of an adverse event    Baseline 03/29/21 self selected:0.79m/s fastest: 0.3m/s    Time 8    Period Weeks    Status New      PT LONG TERM GOAL #4   Title Patient will increase FOTO score to 49 to demonstrate predicted increase in functional mobility to complete ADLs    Baseline 03/29/21 40    Time 8    Period Weeks    Status New                   Plan - 05/17/21 1159     Clinical Impression Statement Pnt was 10 min late to session and presented with no pain or increase in symptoms following last session. SPT progressed dynamic standing hip abd therex, and pnt was able to comply with min verbal cueing. Pnt maintaned good motivation throughout session and required multiple rest breaks  due to fatigue. PT will continue to progress to functional standing movement and multidirectional  hip strengthening.    Personal Factors and Comorbidities Comorbidity 3+;Past/Current Experience;Time since onset of injury/illness/exacerbation;Fitness    Comorbidities COPD, CHF, HTN, CKD    Examination-Activity Limitations Bed Mobility;Carry;Squat;Lift;Reach Overhead;Stand;Stairs;Transfers    Examination-Participation Restrictions Community Activity;Meal Prep;Laundry;Cleaning;Yard Work    [employer]chant [employer] officer Evolving/Moderate complexity    Clinical Decision Making Moderate    Rehab Potential Good    PT Frequency 2x / week    PT Duration 8 weeks    PT Treatment/Interventions Electrical Stimulation;Moist Heat;Traction;Ultrasound;Gait training;Therapeutic activities;Neuromuscular re-education;Manual techniques;Spinal Manipulations;ADLs/Self Care Home Management;Passive range of motion;Therapeutic exercise;Functional mobility training;Stair training;Cryotherapy;Iontophoresis 4mg /ml Dexamethasone;Aquatic Therapy;DME Instruction;Balance training;Patient/family education;Dry needling;Joint Manipulations;Splinting;Taping    PT Next Visit Plan HEP review, 6MWT    PT Home Exercise Plan 5xSTS, supine piriformis and SKTC stretch    Consulted and Agree with Plan of Care Patient             Patient will benefit from skilled therapeutic intervention in order to improve the following deficits and impairments:  Decreased balance, Abnormal gait, Decreased activity tolerance, Decreased endurance, Decreased range of motion, Decreased strength, Increased fascial restricitons, Improper body mechanics, Pain, Postural dysfunction, Decreased coordination, Decreased mobility, Difficulty walking, Hypomobility, Increased muscle spasms, Impaired flexibility, Impaired tone  Visit Diagnosis: Chronic right-sided low back pain, unspecified whether sciatica present     Problem List Patient Active Problem  List   Diagnosis Date Noted   AKI (acute kidney injury) ([place]) 07/19/2020   UTI (urinary tract infection) 07/18/2020   H/O urinary retention 07/18/2020   Hyponatremia 07/18/2020   Acute on chronic combined systolic and diastolic CHF (congestive heart failure) ([place]) 07/12/2020   Hypertensive emergency 07/12/2020   Elevated troponin 07/12/2020   Hypothyroidism 07/12/2020   Neuralgia and neuritis 03/14/2020   Primary generalized (osteo)arthritis 11/30/2019   Encounter for screening mammogram for malignant neoplasm of breast 11/30/2019   Vasomotor rhinitis 06/28/2019   Atopic dermatitis 06/28/2019   Localized superficial swelling, mass, or lump 06/28/2019   Plantar neuroma of left foot 06/28/2019   Chronic obstructive pulmonary disease ([place]) 11/03/2018   Right flank pain 11/03/2018   Dysuria 11/03/2018   Acute upper respiratory infection 05/06/2018   Acquired hypothyroidism 03/18/2018   CKD (chronic kidney disease), stage IIIa 03/18/2018   ARF (acute renal failure) ([place]) 01/14/2018   Hypertensive crisis 01/14/2018   Acute respiratory failure with hypoxia ([place]) 01/13/2018   Encounter for general adult medical examination with abnormal findings 10/26/2017   Allergic rhinitis 10/26/2017   Vitamin D deficiency 10/26/2017   Gastroesophageal reflux disease without esophagitis 10/26/2017   Essential hypertension 04/16/2017   Thyroid disease 04/16/2017   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 09/15/2016   Dilated cardiomyopathy ([place]) 06/27/2016   Bilateral carotid artery stenosis 05/30/2016   SOBOE (shortness of breath on exertion) 05/30/2016   Hyperparathyroidism, primary ([place]) 08/25/2015   Hypercalcemia 08/12/2014   LVH (left ventricular hypertrophy) due to hypertensive disease, without heart failure 07/09/2014   Moderate mitral insufficiency 07/09/2014   Benign essential hypertension 07/06/2014   Osteoporosis, 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   Mixed hyperlipidemia 01/08/2014    Chronic pelvic pain in female 05/31/2011   Fibroids 05/31/2011   Sarcoidosis 05/31/2011   Claiborne Billings O'Daniel, SPT  Patrina Levering PT, DPT   [facility] [address] [place], [address] Phone: [phone]   Fax:  [phone]  Name: Lillis Nuttle MRN: [id number] Date of Birth: [birth year]

## 2021-05-18 ENCOUNTER — Telehealth: Payer: Medicare Other

## 2021-05-18 ENCOUNTER — Other Ambulatory Visit: Payer: Self-pay | Admitting: Physician Assistant

## 2021-05-18 ENCOUNTER — Telehealth: Payer: Self-pay | Admitting: Family

## 2021-05-18 DIAGNOSIS — J309 Allergic rhinitis, unspecified: Secondary | ICD-10-CM

## 2021-05-18 NOTE — Telephone Encounter (Signed)
Patient was approved for [employer] Patient assistance until 03/19/22 for Entresto and lvm with instructions with how to fill medication. ? ? ? ?Shiori Adcox, NT ?

## 2021-05-19 ENCOUNTER — Encounter: Payer: Self-pay | Admitting: Physical Therapy

## 2021-05-19 ENCOUNTER — Other Ambulatory Visit: Payer: Self-pay

## 2021-05-19 ENCOUNTER — Ambulatory Visit: Payer: Medicare Other | Attending: Nurse Practitioner | Admitting: Physical Therapy

## 2021-05-19 DIAGNOSIS — M545 Low back pain, unspecified: Secondary | ICD-10-CM | POA: Insufficient documentation

## 2021-05-19 DIAGNOSIS — R2689 Other abnormalities of gait and mobility: Secondary | ICD-10-CM | POA: Diagnosis not present

## 2021-05-19 DIAGNOSIS — G8929 Other chronic pain: Secondary | ICD-10-CM | POA: Insufficient documentation

## 2021-05-19 NOTE — Therapy (Signed)
[facility] [address] [place], [address] Phone: [phone]   Fax:  [phone]  Physical Therapy Progress Note (Reporting Period 03/29/21- 05/19/21)  Patient Details  Name: Stacey [name] MRN: [id number] Date of Birth: [birth year] Referring Provider (PT): Abernathy NP   Encounter Date: 05/19/2021   PT End of Session - 05/19/21 1629     Visit Number 10    Number of Visits 17    Date for PT Re-Evaluation 05/27/21    Authorization - Visit Number 10    Authorization - Number of Visits 10    PT Start Time 1600    PT Stop Time 6503    PT Time Calculation (min) 45 min    Activity Tolerance Patient tolerated treatment well    Behavior During Therapy [facility] for tasks assessed/performed             Past Medical History:  Diagnosis Date   CHF (congestive heart failure) ([place])    Chronic kidney disease    COPD (chronic obstructive pulmonary disease) ([place])    Hypertension    Sarcoidosis    Thyroid disease     Past Surgical History:  Procedure Laterality Date   CATARACT EXTRACTION     EYE SURGERY Right 07/09/2019   GALLBLADDER SURGERY     KNEE SURGERY     thyroidectomy      There were no vitals filed for this visit.   Subjective Assessment - 05/19/21 1609     Subjective Pnt states she is in no pain and being compliant with HEP    Pertinent History Pt is a 76 year old female presenting with chronic LBP >15 years with insideous onset. She reports no specific event or anything that exacerbated her pain, that she just wanted to try PT because she has been suffering with pain for so long. She has been managing her pain with medications through the pain clinic for "about 15 years" and about 3 steroid injections; no surgical intervention. Patient reports her LBP is R sided and radiates into the R buttock and down the R leg. Her back pain is described as sharp in the low back and achy pain down the legs.  Denies numbness, tingling, or electrical sensation (does report some occassional pain in L toes and L fingers). Current pain 7/10; best 0/10; worst 10/10. Pain is exacerbated by any walking/standing >36mins, cooking, bending foward, lifting, and carrying. Can reduce pain with rest, and stopping activity and taking naproxen before her pain increases. Pt completes bathing, dressing, feeding ind. She cooks but modifies this activity to decrease upright time, husband helps with heavy household chores. Is driving and completing limited community activity. Pt is retired, enjoys dancing with her church dance ministry once a month, keeping her 2 y/o great nephew. Lives at home with her husband in a one story home with 3 steps to enter with a handrail. She has a walk in shower with grab bars, denies issues navigating her home. Reports 1 fall in July 2022 when she stepped in a hole, denies fear of falling. Pt denies N/V, B&B changes, unexplained weight fluctuation, saddle paresthesia, fever, night sweats, or unrelenting night pain at this time.    Limitations Lifting;House hold activities;Standing;Walking    How long can you sit comfortably? unlimited    How long can you stand comfortably? 57mins    How long can you walk comfortably? 21mins    Diagnostic tests none  Patient Stated Goals Would like to be able to complete walking regimen and cooking/cleaning without pain    Currently in Pain? No/denies               Therex  NuStep, seat 3 and UE 10, Level 1 for gentle lumbar spine mobility and strengthening for 5 min.   Thoracolumbar rotation, 3 x12 each  Glute bridges 3 x 10, ball between knees to add adductor activation- mod verbal cueing for adductor and TA recruitment "squeeze, zip, lift"  Step Ups on 8 inch step 3 x 8 each side - min cueing to use single railing; Pnt reported R more difficult than L  Figure 4 stretching 3 x 30 sec bilaterally - modified on right side with L leg extended and R  leg crossed below knee  Seated ball roll out 2 x 12  - min verbal cue to relax neck    5xsts: 10.94 sec  10 m walk : 1.1 m/s     Patient's condition has the potential to improve in response to therapy. Maximum improvement is yet to be obtained. The anticipated improvement is attainable and reasonable in a generally predictable time.  Patient reports          PT Education - 05/19/21 1628     Education Details therex form    Person(s) Educated Patient    Methods Explanation;Demonstration;Tactile cues;Verbal cues    Comprehension Verbalized understanding;Returned demonstration              PT Short Term Goals - 05/19/21 1656       PT SHORT TERM GOAL #1   Title Pt will be independent with HEP in order to improve strength and decrease back pain in order to improve pain-free function at home and work.    Baseline 03/29/21 HEP given    Time 4    Period Weeks    Status Achieved               PT Long Term Goals - 05/19/21 1656       PT LONG TERM GOAL #1   Title Pt will decrease worst back pain as reported on NPRS by at least 2 points in order to demonstrate clinically significant reduction in back pain.    Baseline 04/08/21 10/10 LBP with activity. 05/19/21 0/10    Time 8    Period Weeks    Status Achieved      PT LONG TERM GOAL #2   Title Pt will decrease 5TSTS to at least 12 seconds in order to demonstrate clinically significant improvement in LE strength for age-matched strength norms    Baseline 03/29/21 18sec; 05/19/21 10.94 sec    Time 8    Period Weeks    Status Achieved      PT LONG TERM GOAL #3   Title Pt will increase 10MWT to at least 1.0 m/s in order to demonstrate community ambulation speed with less likelihood of an adverse event    Baseline 03/29/21 self selected:0.35m/s fastest: 0.54m/s. 05/19/21 fastest: 1.1 m/s    Time 8    Period Weeks    Status Achieved      PT LONG TERM GOAL #4   Title Patient will increase FOTO score to 49 to demonstrate  predicted increase in functional mobility to complete ADLs    Baseline 03/29/21 40; 3/2: 58    Time 8    Period Weeks    Status Achieved  Plan - 05/19/21 1655     Clinical Impression Statement Pnt presented with no pain today. SPT reviewed goals for progress note. All goals were achieved demonstrating a significant increase in LE strength and endurance for her dance classes. Therex were progressed to include step ups for continued LE endurance and stair negotiation. Pnt was able to comply with min verbal cueing and experienced no sx aggravation. SPT discussed future d/c plans with pnt. Pnt verbalized understanding of instruction and PT will continue as able.    Personal Factors and Comorbidities Comorbidity 3+;Past/Current Experience;Time since onset of injury/illness/exacerbation;Fitness    Comorbidities COPD, CHF, HTN, CKD    Examination-Activity Limitations Bed Mobility;Carry;Squat;Lift;Reach Overhead;Stand;Stairs;Transfers    Examination-Participation Restrictions Community Activity;Meal Prep;Laundry;Cleaning;Yard Work    [employer]chant [employer] officer Evolving/Moderate complexity    Clinical Decision Making Moderate    Rehab Potential Good    PT Frequency 2x / week    PT Duration 8 weeks    PT Treatment/Interventions Electrical Stimulation;Moist Heat;Traction;Ultrasound;Gait training;Therapeutic activities;Neuromuscular re-education;Manual techniques;Spinal Manipulations;ADLs/Self Care Home Management;Passive range of motion;Therapeutic exercise;Functional mobility training;Stair training;Cryotherapy;Iontophoresis 4mg /ml Dexamethasone;Aquatic Therapy;DME Instruction;Balance training;Patient/family education;Dry needling;Joint Manipulations;Splinting;Taping    PT Next Visit Plan HEP review    PT Home Exercise Plan STS, supine piriformis, side steps with YTB, and SKTC stretch    Consulted and Agree with Plan of Care Patient             Patient will  benefit from skilled therapeutic intervention in order to improve the following deficits and impairments:  Decreased balance, Abnormal gait, Decreased activity tolerance, Decreased endurance, Decreased range of motion, Decreased strength, Increased fascial restricitons, Improper body mechanics, Pain, Postural dysfunction, Decreased coordination, Decreased mobility, Difficulty walking, Hypomobility, Increased muscle spasms, Impaired flexibility, Impaired tone  Visit Diagnosis: Chronic right-sided low back pain, unspecified whether sciatica present     Problem List Patient Active Problem List   Diagnosis Date Noted   AKI (acute kidney injury) ([place]) 07/19/2020   UTI (urinary tract infection) 07/18/2020   H/O urinary retention 07/18/2020   Hyponatremia 07/18/2020   Acute on chronic combined systolic and diastolic CHF (congestive heart failure) ([place]) 07/12/2020   Hypertensive emergency 07/12/2020   Elevated troponin 07/12/2020   Hypothyroidism 07/12/2020   Neuralgia and neuritis 03/14/2020   Primary generalized (osteo)arthritis 11/30/2019   Encounter for screening mammogram for malignant neoplasm of breast 11/30/2019   Vasomotor rhinitis 06/28/2019   Atopic dermatitis 06/28/2019   Localized superficial swelling, mass, or lump 06/28/2019   Plantar neuroma of left foot 06/28/2019   Chronic obstructive pulmonary disease ([place]) 11/03/2018   Right flank pain 11/03/2018   Dysuria 11/03/2018   Acute upper respiratory infection 05/06/2018   Acquired hypothyroidism 03/18/2018   CKD (chronic kidney disease), stage IIIa 03/18/2018   ARF (acute renal failure) ([place]) 01/14/2018   Hypertensive crisis 01/14/2018   Acute respiratory failure with hypoxia ([place]) 01/13/2018   Encounter for general adult medical examination with abnormal findings 10/26/2017   Allergic rhinitis 10/26/2017   Vitamin D deficiency 10/26/2017   Gastroesophageal reflux disease without esophagitis 10/26/2017   Essential  hypertension 04/16/2017   Thyroid disease 04/16/2017   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 09/15/2016   Dilated cardiomyopathy ([place]) 06/27/2016   Bilateral carotid artery stenosis 05/30/2016   SOBOE (shortness of breath on exertion) 05/30/2016   Hyperparathyroidism, primary ([place]) 08/25/2015   Hypercalcemia 08/12/2014   LVH (left ventricular hypertrophy) due to hypertensive disease, without heart failure 07/09/2014   Moderate mitral insufficiency 07/09/2014   Benign essential hypertension 07/06/2014  Osteoporosis, 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   Mixed hyperlipidemia 01/08/2014   Chronic pelvic pain in female 05/31/2011   Fibroids 05/31/2011   Sarcoidosis 05/31/2011   Claiborne Billings O'Daniel, SPT  Patrina Levering PT, DPT   [facility] [address] [place], [address] Phone: [phone]   Fax:  [phone]  Name: Stacey [name] MRN: [id number] Date of Birth: [birth year]

## 2021-05-23 ENCOUNTER — Encounter: Payer: Self-pay | Admitting: Physical Therapy

## 2021-05-23 ENCOUNTER — Ambulatory Visit: Payer: Medicare Other | Admitting: Physical Therapy

## 2021-05-23 ENCOUNTER — Other Ambulatory Visit: Payer: Self-pay

## 2021-05-23 DIAGNOSIS — R2689 Other abnormalities of gait and mobility: Secondary | ICD-10-CM | POA: Diagnosis not present

## 2021-05-23 DIAGNOSIS — M545 Low back pain, unspecified: Secondary | ICD-10-CM | POA: Diagnosis not present

## 2021-05-23 DIAGNOSIS — G8929 Other chronic pain: Secondary | ICD-10-CM

## 2021-05-23 NOTE — Therapy (Signed)
[facility] [address], [place], [address] Phone: [phone]   Fax:  [phone]  Physical Therapy Treatment  Patient Details  Name: Stacey [name] MRN: [id number] Date of Birth: May 14, 1945 Referring Provider (PT): Abernathy NP   Encounter Date: 05/23/2021   PT End of Session - 05/23/21 1446     Visit Number 11    Number of Visits 17    Date for PT Re-Evaluation 05/27/21    Progress Note Due on Visit 20    PT Start Time 1430    PT Stop Time 1515    PT Time Calculation (min) 45 min    Activity Tolerance Patient tolerated treatment well    Behavior During Therapy [facility] for tasks assessed/performed             Past Medical History:  Diagnosis Date   CHF (congestive heart failure) ([place])    Chronic kidney disease    COPD (chronic obstructive pulmonary disease) ([place])    Hypertension    Sarcoidosis    Thyroid disease     Past Surgical History:  Procedure Laterality Date   CATARACT EXTRACTION     EYE SURGERY Right 07/09/2019   GALLBLADDER SURGERY     KNEE SURGERY     thyroidectomy      There were no vitals filed for this visit.   Subjective Assessment - 05/23/21 1437     Subjective Patient states she is doing well. Denies pain and reports compliance with HEP. States she has dance practice this evening.    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 She reports no specific event or anything that exacerbated her pain, that she just wanted to try PT because she has been suffering with pain for so long. She has been managing her pain with medications through the pain clinic for "about 15 years" and about 3 steroid injections; no surgical intervention. Patient reports her LBP is R sided and radiates into the R buttock and down the R leg. Her back pain is described as sharp in the low back and achy pain down the legs. Denies numbness,  tingling, or electrical sensation (does report some occassional pain in L toes and L fingers). Current pain 7/10; best 0/10; worst 10/10. Pain is exacerbated by any walking/standing >23mns, cooking, bending foward, lifting, and carrying. Can reduce pain with rest, and stopping activity and taking naproxen before her pain increases. Pt completes bathing, dressing, feeding ind. She cooks but modifies this activity to decrease upright time, husband helps with heavy household chores. Is driving and completing limited community activity. Pt is retired, enjoys dancing with her church dance ministry once a month, keeping her 2 y/o great nephew. Lives at home with her husband in a one story home with 3 steps to enter with a handrail. She has a walk in shower with grab bars, denies issues navigating her home. Reports 1 fall in July 2022 when she stepped in a hole, denies fear of falling. Pt denies N/V, B&B changes, unexplained weight fluctuation, saddle paresthesia, fever, night sweats, or unrelenting night pain at this time.    Limitations Lifting;House hold activities;Standing;Walking    How long can you sit comfortably? unlimited    How long can you stand comfortably? 367ms    How long can you walk comfortably? 3068m    Diagnostic tests none    Patient Stated Goals Would like  to be able to complete walking regimen and cooking/cleaning without pain    Currently in Pain? No/denies              **LOW BACK**   Therex  NuStep, seat 2 and UE 7, Level 3 for gentle lumbar spine mobility and strengthening for 5 min.  Thoracolumbar rotation, 3 x12 each Standing lumbar extension x10 - centralizes pain  Glute bridges with TA activation, 3 x 10 Seated ball roll out, x12 Seated ball roll out on diagonals, x8 each   Manual Therapy Hamstring stretch RLE, x60 seconds Single knee to chest stretch RLE, x60 seconds  Figure 4 stretch RLE, 2 x60 seconds Sciatic nerve flossing RLE, 2x10 reps     Clinical  Impression: Pt pleasant during session. Pt responded well to stretching and sciatic nerve glides; she also reported centralization of symptoms with repeated extension. May attempt spinal mobilizations and more extension exercises next session. Patient will continue to benefit from skilled PT to address weakness and pain deficits of the low back to return to PLOF and allow pt to participate in hobbies for improved QOL.          PT Short Term Goals - 05/19/21 1656       PT SHORT TERM GOAL #1   Title Pt will be independent with HEP in order to improve strength and decrease back pain in order to improve pain-free function at home and work.    Baseline 03/29/21 HEP given    Time 4    Period Weeks    Status Achieved               PT Long Term Goals - 05/19/21 1656       PT LONG TERM GOAL #1   Title Pt will decrease worst back pain as reported on NPRS by at least 2 points in order to demonstrate clinically significant reduction in back pain.    Baseline 04/08/21 10/10 LBP with activity. 05/19/21 0/10    Time 8    Period Weeks    Status Achieved      PT LONG TERM GOAL #2   Title Pt will decrease 5TSTS to at least 12 seconds in order to demonstrate clinically significant improvement in LE strength for age-matched strength norms    Baseline 03/29/21 18sec; 05/19/21 10.94 sec    Time 8    Period Weeks    Status Achieved      PT LONG TERM GOAL #3   Title Pt will increase 10MWT to at least 1.0 m/s in order to demonstrate community ambulation speed with less likelihood of an adverse event    Baseline 03/29/21 self selected:0.69ms fastest: 0.771m. 05/19/21 fastest: 1.1 m/s    Time 8    Period Weeks    Status Achieved      PT LONG TERM GOAL #4   Title Patient will increase FOTO score to 49 to demonstrate predicted increase in functional mobility to complete ADLs    Baseline 03/29/21 40; 3/2: 58    Time 8    Period Weeks    Status Achieved                   Plan - 05/23/21  1644     Clinical Impression Statement Pt pleasant during session. Pt responded well to stretching and sciatic nerve glides; she also reported centralization of symptoms with repeated extension. May attempt spinal mobilizations and more extension exercises next session. Patient will continue to benefit from  skilled PT to address weakness and pain deficits of the low back to return to PLOF and allow pt to participate in hobbies for improved QOL.    Personal Factors and Comorbidities Comorbidity 3+;Past/Current Experience;Time since onset of injury/illness/exacerbation;Fitness    Comorbidities COPD, CHF, HTN, CKD    Examination-Activity Limitations Bed Mobility;Carry;Squat;Lift;Reach Overhead;Stand;Stairs;Transfers    Examination-Participation Restrictions Community Activity;Meal Prep;Laundry;Cleaning;Yard Work    Merchant [employer] officer Evolving/Moderate complexity    Rehab Potential Good    PT Frequency 2x / week    PT Duration 8 weeks    PT Treatment/Interventions Electrical Stimulation;Moist Heat;Traction;Ultrasound;Gait training;Therapeutic activities;Neuromuscular re-education;Manual techniques;Spinal Manipulations;ADLs/Self Care Home Management;Passive range of motion;Therapeutic exercise;Functional mobility training;Stair training;Cryotherapy;Iontophoresis '4mg'$ /ml Dexamethasone;Aquatic Therapy;DME Instruction;Balance training;Patient/family education;Dry needling;Joint Manipulations;Splinting;Taping    PT Next Visit Plan HEP review    PT Home Exercise Plan STS, supine piriformis, side steps with YTB, and SKTC stretch    Consulted and Agree with Plan of Care Patient             Patient will benefit from skilled therapeutic intervention in order to improve the following deficits and impairments:  Decreased balance, Abnormal gait, Decreased activity tolerance, Decreased endurance, Decreased range of motion, Decreased strength, Increased fascial restricitons, Improper body  mechanics, Pain, Postural dysfunction, Decreased coordination, Decreased mobility, Difficulty walking, Hypomobility, Increased muscle spasms, Impaired flexibility, Impaired tone  Visit Diagnosis: Chronic right-sided low back pain, unspecified whether sciatica present     Problem List Patient Active Problem List   Diagnosis Date Noted   AKI (acute kidney injury) ([place]) 07/19/2020   UTI (urinary tract infection) 07/18/2020   H/O urinary retention 07/18/2020   Hyponatremia 07/18/2020   Acute on chronic combined systolic and diastolic CHF (congestive heart failure) ([place]) 07/12/2020   Hypertensive emergency 07/12/2020   Elevated troponin 07/12/2020   Hypothyroidism 07/12/2020   Neuralgia and neuritis 03/14/2020   Primary generalized (osteo)arthritis 11/30/2019   Encounter for screening mammogram for malignant neoplasm of breast 11/30/2019   Vasomotor rhinitis 06/28/2019   Atopic dermatitis 06/28/2019   Localized superficial swelling, mass, or lump 06/28/2019   Plantar neuroma of left foot 06/28/2019   Chronic obstructive pulmonary disease ([place]) 11/03/2018   Right flank pain 11/03/2018   Dysuria 11/03/2018   Acute upper respiratory infection 05/06/2018   Acquired hypothyroidism 03/18/2018   CKD (chronic kidney disease), stage IIIa 03/18/2018   ARF (acute renal failure) ([place]) 01/14/2018   Hypertensive crisis 01/14/2018   Acute respiratory failure with hypoxia ([place]) 01/13/2018   Encounter for general adult medical examination with abnormal findings 10/26/2017   Allergic rhinitis 10/26/2017   Vitamin D deficiency 10/26/2017   Gastroesophageal reflux disease without esophagitis 10/26/2017   Essential hypertension 04/16/2017   Thyroid disease 04/16/2017   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 09/15/2016   Dilated cardiomyopathy ([place]) 06/27/2016   Bilateral carotid artery stenosis 05/30/2016   SOBOE (shortness of breath on exertion) 05/30/2016   Hyperparathyroidism, primary  ([place]) 08/25/2015   Hypercalcemia 08/12/2014   LVH (left ventricular hypertrophy) due to hypertensive disease, without heart failure 07/09/2014   Moderate mitral insufficiency 07/09/2014   Benign essential hypertension 07/06/2014   Osteoporosis, 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   Mixed hyperlipidemia 01/08/2014   Chronic pelvic pain in female 05/31/2011   Fibroids 05/31/2011   Sarcoidosis 05/31/2011    Patrina Levering PT, DPT   [facility] [address], [place], [address] Phone: [phone]   Fax:  [phone]  Name: Scarlette Hogston MRN: [id number] Date of  Birth: [birth year]

## 2021-05-24 ENCOUNTER — Telehealth: Payer: Self-pay | Admitting: Family

## 2021-05-24 DIAGNOSIS — I5043 Acute on chronic combined systolic (congestive) and diastolic (congestive) heart failure: Secondary | ICD-10-CM | POA: Diagnosis not present

## 2021-05-24 DIAGNOSIS — D8685 Sarcoid myocarditis: Secondary | ICD-10-CM | POA: Diagnosis not present

## 2021-05-24 DIAGNOSIS — I119 Hypertensive heart disease without heart failure: Secondary | ICD-10-CM | POA: Diagnosis not present

## 2021-05-24 NOTE — Telephone Encounter (Signed)
Notified patient that [facility] has shipped patients [facility] and she should receive it in a couple days. ? ? ?Markelle Asaro, NT ?

## 2021-05-25 ENCOUNTER — Ambulatory Visit: Payer: Medicare Other | Admitting: Physical Therapy

## 2021-05-25 ENCOUNTER — Other Ambulatory Visit: Payer: Self-pay

## 2021-05-25 ENCOUNTER — Encounter: Payer: Self-pay | Admitting: Physical Therapy

## 2021-05-25 DIAGNOSIS — G8929 Other chronic pain: Secondary | ICD-10-CM

## 2021-05-25 DIAGNOSIS — M545 Low back pain, unspecified: Secondary | ICD-10-CM | POA: Diagnosis not present

## 2021-05-25 DIAGNOSIS — R2689 Other abnormalities of gait and mobility: Secondary | ICD-10-CM | POA: Diagnosis not present

## 2021-05-25 NOTE — Therapy (Signed)
[facility] [address], [place], [address] Phone: [phone]   Fax:  [phone]  Physical Therapy Treatment  Patient Details  Name: Stacey [name] MRN: [id number] Date of Birth: 24-Jun-1945 Referring Provider (PT): Abernathy NP   Encounter Date: 05/25/2021   PT End of Session - 05/25/21 1353     Visit Number 12    Number of Visits 17    Date for PT Re-Evaluation 05/27/21    Progress Note Due on Visit 20    PT Start Time 1347    PT Stop Time 1430    PT Time Calculation (min) 43 min    Activity Tolerance Patient tolerated treatment well    Behavior During Therapy [facility] for tasks assessed/performed             Past Medical History:  Diagnosis Date   CHF (congestive heart failure) ([place])    Chronic kidney disease    COPD (chronic obstructive pulmonary disease) ([place])    Hypertension    Sarcoidosis    Thyroid disease     Past Surgical History:  Procedure Laterality Date   CATARACT EXTRACTION     EYE SURGERY Right 07/09/2019   GALLBLADDER SURGERY     KNEE SURGERY     thyroidectomy      There were no vitals filed for this visit.   Subjective Assessment - 05/25/21 1351     Subjective Patient states she is in pain today, 6/10 in low back. she thinks it was caused by picking up/playing with her grandbabies yesterday (34 y/o and 2 y/o)    Pertinent History Pt is a 76 year old female presenting with chronic LBP >15 years with insideous onset. She reports no specific event or anything that exacerbated her pain, that she just wanted to try PT because she has been suffering with pain for so long. She has been managing her pain with medications through the pain clinic for "about 15 years" and about 3 steroid injections; no surgical intervention. Patient reports her LBP is R sided and radiates into the R buttock and down the R leg. Her back pain is described as sharp in the low back and achy pain down  the legs. Denies numbness, tingling, or electrical sensation (does report some occassional pain in L toes and L fingers). Current pain 7/10; best 0/10; worst 10/10. Pain is exacerbated by any walking/standing >98mns, cooking, bending foward, lifting, and carrying. Can reduce pain with rest, and stopping activity and taking naproxen before her pain increases. Pt completes bathing, dressing, feeding ind. She cooks but modifies this activity to decrease upright time, husband helps with heavy household chores. Is driving and completing limited community activity. Pt is retired, enjoys dancing with her church dance ministry once a month, keeping her 2 y/o great nephew. Lives at home with her husband in a one story home with 3 steps to enter with a handrail. She has a walk in shower with grab bars, denies issues navigating her home. Reports 1 fall in July 2022 when she stepped in a hole, denies fear of falling. Pt denies N/V, B&B changes, unexplained weight fluctuation, saddle paresthesia, fever, night sweats, or unrelenting night pain at this time.    Limitations Lifting;House hold activities;Standing;Walking    How long can you sit comfortably? unlimited    How long can you stand comfortably? 359ms    How long can you walk comfortably? 3028m    Diagnostic tests none  Patient Stated Goals Would like to be able to complete walking regimen and cooking/cleaning without pain    Currently in Pain? Yes    Pain Score 6     Pain Location Back    Pain Orientation Right;Lower    Pain Descriptors / Indicators Aching    Pain Type Chronic pain              **LOW BACK**     Therex  NuStep, seat 2 and UE 7, Level 3 for gentle lumbar spine mobility and strengthening for 5 min.  Thoracolumbar rotation, x10 each side with 10 second hold  Standing lumbar extension x10 - centralizes pain  Glute bridges with TA activation and 3 second hold, 3 x 10 PB hamstring curl with coordination of TA activation and  breathing pattern, 3x10 Seated ball roll out, x12 Seated ball roll out on diagonals, x8 each    Manual Therapy Hamstring stretch RLE, 2x60 seconds Single knee to chest stretch RLE, x60 seconds  Piriformis stretch RLE, 2 x60 seconds Sciatic nerve flossing RLE, 2x10 reps        Clinical Impression: Pt pleasant during session. She arrived with increased right-sided low back pain this session after playing with her grandchildren yesterday. Pain modulation was of focus including light/moderate stretching of RLE, sciatic nerve flossing and extension-based exercises that have been found to centralize her symptoms. Patient reporting decrease in pain intensity at end of session. Patient will continue to benefit from skilled PT to address weakness and pain deficits of the low back to return to PLOF and allow pt to participate in hobbies for improved QOL.             PT Short Term Goals - 05/19/21 1656       PT SHORT TERM GOAL #1   Title Pt will be independent with HEP in order to improve strength and decrease back pain in order to improve pain-free function at home and work.    Baseline 03/29/21 HEP given    Time 4    Period Weeks    Status Achieved               PT Long Term Goals - 05/19/21 1656       PT LONG TERM GOAL #1   Title Pt will decrease worst back pain as reported on NPRS by at least 2 points in order to demonstrate clinically significant reduction in back pain.    Baseline 04/08/21 10/10 LBP with activity. 05/19/21 0/10    Time 8    Period Weeks    Status Achieved      PT LONG TERM GOAL #2   Title Pt will decrease 5TSTS to at least 12 seconds in order to demonstrate clinically significant improvement in LE strength for age-matched strength norms    Baseline 03/29/21 18sec; 05/19/21 10.94 sec    Time 8    Period Weeks    Status Achieved      PT LONG TERM GOAL #3   Title Pt will increase 10MWT to at least 1.0 m/s in order to demonstrate community ambulation speed  with less likelihood of an adverse event    Baseline 03/29/21 self selected:0.41ms fastest: 0.728m. 05/19/21 fastest: 1.1 m/s    Time 8    Period Weeks    Status Achieved      PT LONG TERM GOAL #4   Title Patient will increase FOTO score to 49 to demonstrate predicted increase in functional mobility to complete ADLs  Baseline 03/29/21 40; 3/2: 58    Time 8    Period Weeks    Status Achieved                   Plan - 05/25/21 1416     Clinical Impression Statement Pt pleasant during session. She arrived with increased right-sided low back pain this session after playing with her grandchildren yesterday. Pain modulation was of focus including light/moderate stretching of RLE, sciatic nerve flossing and extension-based exercises that have been found to centralize her symptoms. Patient reporting decrease in pain intensity at end of session. Patient will continue to benefit from skilled PT to address weakness and pain deficits of the low back to return to PLOF and allow pt to participate in hobbies for improved QOL.    Personal Factors and Comorbidities Comorbidity 3+;Past/Current Experience;Time since onset of injury/illness/exacerbation;Fitness    Comorbidities COPD, CHF, HTN, CKD    Examination-Activity Limitations Bed Mobility;Carry;Squat;Lift;Reach Overhead;Stand;Stairs;Transfers    Examination-Participation Restrictions Community Activity;Meal Prep;Laundry;Cleaning;Yard Work    [employer]chant [employer] officer Evolving/Moderate complexity    Rehab Potential Good    PT Frequency 2x / week    PT Duration 8 weeks    PT Treatment/Interventions Electrical Stimulation;Moist Heat;Traction;Ultrasound;Gait training;Therapeutic activities;Neuromuscular re-education;Manual techniques;Spinal Manipulations;ADLs/Self Care Home Management;Passive range of motion;Therapeutic exercise;Functional mobility training;Stair training;Cryotherapy;Iontophoresis '4mg'$ /ml Dexamethasone;Aquatic Therapy;DME  Instruction;Balance training;Patient/family education;Dry needling;Joint Manipulations;Splinting;Taping    PT Next Visit Plan HEP review    PT Home Exercise Plan STS, supine piriformis, side steps with YTB, and SKTC stretch    Consulted and Agree with Plan of Care Patient             Patient will benefit from skilled therapeutic intervention in order to improve the following deficits and impairments:  Decreased balance, Abnormal gait, Decreased activity tolerance, Decreased endurance, Decreased range of motion, Decreased strength, Increased fascial restricitons, Improper body mechanics, Pain, Postural dysfunction, Decreased coordination, Decreased mobility, Difficulty walking, Hypomobility, Increased muscle spasms, Impaired flexibility, Impaired tone  Visit Diagnosis: Chronic right-sided low back pain, unspecified whether sciatica present     Problem List Patient Active Problem List   Diagnosis Date Noted   AKI (acute kidney injury) ([place]) 07/19/2020   UTI (urinary tract infection) 07/18/2020   H/O urinary retention 07/18/2020   Hyponatremia 07/18/2020   Acute on chronic combined systolic and diastolic CHF (congestive heart failure) ([place]) 07/12/2020   Hypertensive emergency 07/12/2020   Elevated troponin 07/12/2020   Hypothyroidism 07/12/2020   Neuralgia and neuritis 03/14/2020   Primary generalized (osteo)arthritis 11/30/2019   Encounter for screening mammogram for malignant neoplasm of breast 11/30/2019   Vasomotor rhinitis 06/28/2019   Atopic dermatitis 06/28/2019   Localized superficial swelling, mass, or lump 06/28/2019   Plantar neuroma of left foot 06/28/2019   Chronic obstructive pulmonary disease ([place]) 11/03/2018   Right flank pain 11/03/2018   Dysuria 11/03/2018   Acute upper respiratory infection 05/06/2018   Acquired hypothyroidism 03/18/2018   CKD (chronic kidney disease), stage IIIa 03/18/2018   ARF (acute renal failure) ([place]) 01/14/2018   Hypertensive crisis  01/14/2018   Acute respiratory failure with hypoxia ([place]) 01/13/2018   Encounter for general adult medical examination with abnormal findings 10/26/2017   Allergic rhinitis 10/26/2017   Vitamin D deficiency 10/26/2017   Gastroesophageal reflux disease without esophagitis 10/26/2017   Essential hypertension 04/16/2017   Thyroid disease 04/16/2017   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 09/15/2016   Dilated cardiomyopathy ([place]) 06/27/2016   Bilateral carotid artery stenosis 05/30/2016   SOBOE (shortness of  breath on exertion) 05/30/2016   Hyperparathyroidism, primary ([place]) 08/25/2015   Hypercalcemia 08/12/2014   LVH (left ventricular hypertrophy) due to hypertensive disease, without heart failure 07/09/2014   Moderate mitral insufficiency 07/09/2014   Benign essential hypertension 07/06/2014   Osteoporosis, 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   Mixed hyperlipidemia 01/08/2014   Chronic pelvic pain in female 05/31/2011   Fibroids 05/31/2011   Sarcoidosis 05/31/2011    Patrina Levering PT, DPT   [place] [facility] [address] [place], [address] Phone: [phone]   Fax:  [phone]  Name: Stacey [name] MRN: [id number] Date of Birth: 09-28-1945

## 2021-05-30 ENCOUNTER — Other Ambulatory Visit: Payer: Self-pay

## 2021-05-30 ENCOUNTER — Encounter: Payer: Self-pay | Admitting: Physical Therapy

## 2021-05-30 ENCOUNTER — Ambulatory Visit: Payer: Medicare Other | Admitting: Physical Therapy

## 2021-05-30 DIAGNOSIS — M545 Low back pain, unspecified: Secondary | ICD-10-CM | POA: Diagnosis not present

## 2021-05-30 DIAGNOSIS — R2689 Other abnormalities of gait and mobility: Secondary | ICD-10-CM | POA: Diagnosis not present

## 2021-05-30 DIAGNOSIS — G8929 Other chronic pain: Secondary | ICD-10-CM | POA: Diagnosis not present

## 2021-05-30 NOTE — Therapy (Signed)
[place] [address], [place], [address] Phone: [phone]   Fax:  [phone]  Physical Therapy Treatment/Physical Therapy Re-certification Note   Dates of reporting period  03/29/21   to   05/30/21   Patient Details  Name: Stacey [name] MRN: [id number] Date of Birth: 09/23/1945 Referring Provider (PT): Abernathy NP   Encounter Date: 05/30/2021   PT End of Session - 05/30/21 1553     Visit Number 13    Number of Visits 29    Date for PT Re-Evaluation 07/25/21    Progress Note Due on Visit 72    PT Start Time 1348    PT Stop Time 1429    PT Time Calculation (min) 41 min    Activity Tolerance Patient tolerated treatment well    Behavior During Therapy [facility] for tasks assessed/performed             Past Medical History:  Diagnosis Date   CHF (congestive heart failure) ([place])    Chronic kidney disease    COPD (chronic obstructive pulmonary disease) ([place])    Hypertension    Sarcoidosis    Thyroid disease     Past Surgical History:  Procedure Laterality Date   CATARACT EXTRACTION     EYE SURGERY Right 07/09/2019   GALLBLADDER SURGERY     KNEE SURGERY     thyroidectomy      There were no vitals filed for this visit.   Subjective Assessment - 05/30/21 1350     Subjective Patient states she rested yesterday after leaving church; she had a busy Thur-Fri-Sat. She reports 5/10 pain in low back today.    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 She reports no specific event or anything that exacerbated her pain, that she just wanted to try PT because she has been suffering with pain for so long. She has been managing her pain with medications through the pain clinic for "about 15 years" and about 3 steroid injections; no surgical intervention. Patient reports her LBP is R sided and radiates into the R buttock and down the R  leg. Her back pain is described as sharp in the low back and achy pain down the legs. Denies numbness, tingling, or electrical sensation (does report some occassional pain in L toes and L fingers). Current pain 7/10; best 0/10; worst 10/10. Pain is exacerbated by any walking/standing >59mns, cooking, bending foward, lifting, and carrying. Can reduce pain with rest, and stopping activity and taking naproxen before her pain increases. Pt completes bathing, dressing, feeding ind. She cooks but modifies this activity to decrease upright time, husband helps with heavy household chores. Is driving and completing limited community activity. Pt is retired, enjoys dancing with her church dance ministry once a month, keeping her 2 y/o great nephew. Lives at home with her husband in a one story home with 3 steps to enter with a handrail. She has a walk in shower with grab bars, denies issues navigating her home. Reports 1 fall in July 2022 when she stepped in a hole, denies fear of falling. Pt denies N/V, B&B changes, unexplained weight fluctuation, saddle paresthesia, fever, night sweats, or unrelenting night pain at this time.    Limitations Lifting;House hold activities;Standing;Walking    How long can you sit comfortably? unlimited    How long can you stand comfortably? 375ms    How long  can you walk comfortably? 5mns    Diagnostic tests none    Patient Stated Goals Would like to be able to complete walking regimen and cooking/cleaning without pain    Currently in Pain? Yes    Pain Score 5     Pain Location Back    Pain Orientation Right;Lower    Pain Descriptors / Indicators Aching    Pain Type Chronic pain               RE-EVALUATION  **LOW BACK**     Therex  NuStep, seat 3 and UE 9, Level 3 for gentle lumbar spine mobility and strengthening for 5 min.  Thoracolumbar rotation, x12 each side Standing lumbar extension x10 - centralizes pain     Manual Therapy CPA and bilateral UPA to  L1-L5, grade 2-3, 20 seconds x2-3 sets. STM utilizing rolling stick to right lumbar paraspinals, glutes, piriformis.  Hamstring stretch RLE, x60 seconds Piriformis stretch RLE,  x60 seconds Sciatic nerve flossing RLE, x10 reps        Clinical Impression: Pt pleasant during session. Pain intensity continues to reach a 10/10 however frequency of pain has decreased and threshold has improved (able to stand for longer in the kitchen). PT altered POC to include spinal mobilizations and STM for pain modulation and improved ROM. Pt was sensitive along spinous processes during CPA; she did report decrease in pain with increased reps. Not as sensitive over transverse processes. Manual was followed by stretching. Pt reporting she feels less pain than when she came in. Plan to continue manual therapy next session. New goals have been created to address patient's remaining deficits including standing tolerance. Patient will continue to benefit from skilled PT to address weakness and pain deficits of the low back to return to PLOF and allow pt to participate in hobbies for improved QOL.             PT Short Term Goals - 05/19/21 1656       PT SHORT TERM GOAL #1   Title Pt will be independent with HEP in order to improve strength and decrease back pain in order to improve pain-free function at home and work.    Baseline 03/29/21 HEP given    Time 4    Period Weeks    Status Achieved               PT Long Term Goals - 05/30/21 1417       PT LONG TERM GOAL #1   Title Pt will decrease worst back pain as reported on NPRS by at least 2 points in order to demonstrate clinically significant reduction in back pain.    Baseline 04/08/21 10/10 LBP with activity. 05/30/21 10/10    Time 8    Period Weeks    Status On-going    Target Date 07/25/21      PT LONG TERM GOAL #2   Title Pt will decrease 5TSTS to at least 12 seconds in order to demonstrate clinically significant improvement in LE  strength for age-matched strength norms    Baseline 03/29/21 18sec; 05/19/21 10.94 sec    Time 8    Period Weeks    Status Achieved      PT LONG TERM GOAL #3   Title Pt will increase 10MWT to at least 1.0 m/s in order to demonstrate community ambulation speed with less likelihood of an adverse event    Baseline 03/29/21 self selected:0.617m fastest: 0.7766m 05/19/21 fastest: 1.1 m/s  Time 8    Period Weeks    Status Achieved      PT LONG TERM GOAL #4   Title Patient will increase FOTO score to 49 to demonstrate predicted increase in functional mobility to complete ADLs    Baseline 03/29/21 40; 3/2: 58    Time 8    Period Weeks    Status Achieved      PT LONG TERM GOAL #5   Title Pt will improve Oswestry score 7 points or greater to demonstrate subjective improvement in QOL related to back pain.    Baseline 05/30/21: 22% (score: 11)    Time 8    Period Weeks    Status New    Target Date 07/25/21      Additional Long Term Goals   Additional Long Term Goals Yes      PT LONG TERM GOAL #6   Title Pt will report being able to stand for 2 hours or greater in order to perform household work such as cooking dinner and cleaning dishes afterwards with no more than 8/10 pain (decrease in NPS by 2 points per MCID).    Baseline 05/30/21: able to stand for 30-45 minutes with 10/10 pain    Time 8    Period Weeks    Status New    Target Date 07/25/21                   Plan - 05/30/21 1553     Clinical Impression Statement Pt pleasant during session. Pain intensity continues to reach a 10/10 however frequency of pain has decreased and threshold has improved (able to stand for longer in the kitchen). PT altered POC to include spinal mobilizations and STM for pain modulation and improved ROM. Pt was sensitive along spinous processes during CPA; she did report decrease in pain with increased reps. Not as sensitive over transverse processes. Manual was followed by stretching. Pt reporting  she feels less pain than when she came in. Plan to continue manual therapy next session. New goals have been created to address patient's remaining deficits including standing tolerance. Patient will continue to benefit from skilled PT to address weakness and pain deficits of the low back to return to PLOF and allow pt to participate in hobbies for improved QOL.    Personal Factors and Comorbidities Comorbidity 3+;Past/Current Experience;Time since onset of injury/illness/exacerbation;Fitness    Comorbidities COPD, CHF, HTN, CKD    Examination-Activity Limitations Bed Mobility;Carry;Squat;Lift;Reach Overhead;Stand;Stairs;Transfers    Examination-Participation Restrictions Community Activity;Meal Prep;Laundry;Cleaning;Yard Work    Merchant [employer] officer Evolving/Moderate complexity    Rehab Potential Good    PT Frequency 2x / week    PT Duration 8 weeks    PT Treatment/Interventions Electrical Stimulation;Moist Heat;Traction;Ultrasound;Gait training;Therapeutic activities;Neuromuscular re-education;Manual techniques;Spinal Manipulations;ADLs/Self Care Home Management;Passive range of motion;Therapeutic exercise;Functional mobility training;Stair training;Cryotherapy;Iontophoresis '4mg'$ /ml Dexamethasone;Aquatic Therapy;DME Instruction;Balance training;Patient/family education;Dry needling;Joint Manipulations;Splinting;Taping    PT Next Visit Plan HEP review    PT Home Exercise Plan STS, supine piriformis, side steps with YTB, and SKTC stretch    Consulted and Agree with Plan of Care Patient             Patient will benefit from skilled therapeutic intervention in order to improve the following deficits and impairments:  Decreased balance, Abnormal gait, Decreased activity tolerance, Decreased endurance, Decreased range of motion, Decreased strength, Increased fascial restricitons, Improper body mechanics, Pain, Postural dysfunction, Decreased coordination, Decreased mobility, Difficulty  walking, Hypomobility, Increased muscle spasms, Impaired flexibility, Impaired tone  Visit Diagnosis: Chronic right-sided low back pain, unspecified whether sciatica present     Problem List Patient Active Problem List   Diagnosis Date Noted   AKI (acute kidney injury) ([place]) 07/19/2020   UTI (urinary tract infection) 07/18/2020   H/O urinary retention 07/18/2020   Hyponatremia 07/18/2020   Acute on chronic combined systolic and diastolic CHF (congestive heart failure) ([place]) 07/12/2020   Hypertensive emergency 07/12/2020   Elevated troponin 07/12/2020   Hypothyroidism 07/12/2020   Neuralgia and neuritis 03/14/2020   Primary generalized (osteo)arthritis 11/30/2019   Encounter for screening mammogram for malignant neoplasm of breast 11/30/2019   Vasomotor rhinitis 06/28/2019   Atopic dermatitis 06/28/2019   Localized superficial swelling, mass, or lump 06/28/2019   Plantar neuroma of left foot 06/28/2019   Chronic obstructive pulmonary disease ([place]) 11/03/2018   Right flank pain 11/03/2018   Dysuria 11/03/2018   Acute upper respiratory infection 05/06/2018   Acquired hypothyroidism 03/18/2018   CKD (chronic kidney disease), stage IIIa 03/18/2018   ARF (acute renal failure) ([place]) 01/14/2018   Hypertensive crisis 01/14/2018   Acute respiratory failure with hypoxia ([place]) 01/13/2018   Encounter for general adult medical examination with abnormal findings 10/26/2017   Allergic rhinitis 10/26/2017   Vitamin D deficiency 10/26/2017   Gastroesophageal reflux disease without esophagitis 10/26/2017   Essential hypertension 04/16/2017   Thyroid disease 04/16/2017   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 09/15/2016   Dilated cardiomyopathy ([place]) 06/27/2016   Bilateral carotid artery stenosis 05/30/2016   SOBOE (shortness of breath on exertion) 05/30/2016   Hyperparathyroidism, primary ([place]) 08/25/2015   Hypercalcemia 08/12/2014   LVH (left ventricular hypertrophy) due to  hypertensive disease, without heart failure 07/09/2014   Moderate mitral insufficiency 07/09/2014   Benign essential hypertension 07/06/2014   Osteoporosis, 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   Mixed hyperlipidemia 01/08/2014   Chronic pelvic pain in female 05/31/2011   Fibroids 05/31/2011   Sarcoidosis 05/31/2011    Patrina Levering PT, DPT    [facility] [address] [place], [address] Phone: [phone]   Fax:  [phone]  Name: Lawson Isabell MRN: [id number] Date of Birth: [birth year]

## 2021-05-31 ENCOUNTER — Other Ambulatory Visit (HOSPITAL_COMMUNITY): Payer: Self-pay

## 2021-05-31 ENCOUNTER — Encounter (HOSPITAL_COMMUNITY): Payer: Self-pay

## 2021-05-31 NOTE — Progress Notes (Signed)
Today had a home visit with Stacey [name].  She states doing well, she still having PT on back.  She states been hurting last few days.  She has a big trip planned in May.  She states never received her Entresto in the mail.  Contacted them and they advised it was delivered to front porch.  She looked again and found it.  It was on her front porch.  She has all her medications and aware of how to take them.  She tries to watch high sodium foods and watches her fluids.  She has been getting out in to the senior centers during the week.  She is wanting to start water aerobics, advised her to contact the [employer].  Gave her the phone number to them.  She denies any chest pain, headaches, dizziness or increased shortness of breath.  She has slight edema in ankles but legs has none.  Abdomen soft and non tender.    Lungs are clear.  She is aware of up coming appts.  Will continue to visit for heart failure, diet and medication management.  ? ?Stacey [name] ?Stacey [name] EMT-Paramedic ?[phone] ?

## 2021-06-01 ENCOUNTER — Encounter: Payer: Self-pay | Admitting: Physical Therapy

## 2021-06-01 ENCOUNTER — Ambulatory Visit: Payer: Medicare Other | Admitting: Physical Therapy

## 2021-06-01 ENCOUNTER — Other Ambulatory Visit: Payer: Self-pay

## 2021-06-01 DIAGNOSIS — G8929 Other chronic pain: Secondary | ICD-10-CM

## 2021-06-01 DIAGNOSIS — M545 Low back pain, unspecified: Secondary | ICD-10-CM | POA: Diagnosis not present

## 2021-06-01 DIAGNOSIS — R2689 Other abnormalities of gait and mobility: Secondary | ICD-10-CM | POA: Diagnosis not present

## 2021-06-01 NOTE — Progress Notes (Signed)
? Patient ID: Stacey [name], female    DOB: 16-Aug-1945, 76 y.o.   MRN: [id number] ? ?HPI ? ?[name] is a 76 y/o female with a history of HTN, CKD, thyroid disease, COPD and chronic heart failure.  ? ?Echo report from 05/24/21 reviewed and showed an EF of 50% along with moderate LVH, moderate MR and mild AS. Echo report from 07/12/20 reviewed and showed an EF of 20-25% along with mild MR/AR/ AS.  ? ?Has not been admitted or been in the ED in the last 6 months.  ? ?She presents today for a follow-up visit with a chief complaint of minimal shortness of breath upon moderate exertion. She describes this as chronic. She has associated fatigue, cough, rare chest pain, palpitations, difficulty sleeping and chronic pain along with this. She denies any dizziness, abdominal distention, pedal edema or weight gain.  ? ?Currently going to PT twice a week for her back pain. Has also been very active.  ? ?Past Medical History:  ?Diagnosis Date  ? CHF (congestive heart failure) ([place])   ? Chronic kidney disease   ? COPD (chronic obstructive pulmonary disease) ([place])   ? Hypertension   ? Sarcoidosis   ? Thyroid disease   ? ?Past Surgical History:  ?Procedure Laterality Date  ? CATARACT EXTRACTION    ? EYE SURGERY Right 07/09/2019  ? GALLBLADDER SURGERY    ? KNEE SURGERY    ? thyroidectomy    ? ?Family History  ?Problem Relation Age of Onset  ? Hypertension Mother   ? Diabetes Mother   ? Anuerysm Mother   ? Diabetes Father   ? Hypertension Father   ? Prostate cancer Neg Hx   ? Bladder Cancer Neg Hx   ? Kidney cancer Neg Hx   ? Breast cancer Neg Hx   ? ?Social History  ? ?Tobacco Use  ? Smoking status: Never  ? Smokeless tobacco: Never  ?Substance Use Topics  ? Alcohol use: No  ? ?Allergies  ?Allergen Reactions  ? Codeine Other (See Comments)  ?  Other reaction(s): GI Intolerance ?Other Reaction: nausea   chest pain ?Nausea ? ?Other reaction(s): GI Intolerance, Other (See Comments) ?Nausea ?GI Intolerance, nausea, chest pain ?  ?  Propoxyphene Other (See Comments)  ?  Other Reaction: nausea and chest pain  ? Alendronate   ?  Other reaction(s): Other (See Comments) ?Chest pain  ? Ciprofloxacin Rash  ? Lisinopril   ?  Other reaction(s): Cough  ? Amlodipine Swelling  ? Aspirin   ?  Other reaction(s): Other (See Comments) ?Other reaction(s): Other (See Comments) ?Stomach hurt  ? Methotrexate Hives  ? Tramadol Nausea And Vomiting  ? Vicodin [Hydrocodone-Acetaminophen] Nausea And Vomiting  ? ?Prior to Admission medications   ?Medication Sig Start Date End Date Taking? Authorizing Provider  ?carvedilol (COREG) 6.25 MG tablet Take 1 tablet (6.25 mg total) by mouth 2 (two) times daily with a meal. 07/15/20  Yes Jennye Boroughs, MD  ?cetirizine (ZYRTEC) 10 MG tablet TAKE 1 TABLET(10 MG) BY MOUTH DAILY 05/18/21  Yes Abernathy, Alyssa, NP  ?cloNIDine (CATAPRES) 0.1 MG tablet Take 1 tablet (0.1 mg total) by mouth 2 (two) times daily. 03/02/21  Yes Jonetta Osgood, NP  ?dapagliflozin propanediol (FARXIGA) 10 MG TABS tablet Take 1 tablet (10 mg total) by mouth daily before breakfast. 12/10/20  Yes Alisa Graff, FNP  ?fluticasone (FLONASE) 50 MCG/ACT nasal spray Place 2 sprays into both nostrils daily. 03/23/20  Yes Luiz Ochoa, NP  ?  furosemide (LASIX) 20 MG tablet Take 2 tablets (40 mg total) by mouth daily. 03/23/21  Yes Darylene Price A, FNP  ?gabapentin (NEURONTIN) 100 MG capsule TAKE 2 CAPSULES(200 MG) BY MOUTH AT BEDTIME 04/10/21  Yes Abernathy, Alyssa, NP  ?hydrALAZINE (APRESOLINE) 50 MG tablet TAKE 2 TABLET BY MOUTH THREE TIMES DAILY 02/17/21  Yes Abernathy, Alyssa, NP  ?latanoprost (XALATAN) 0.005 % ophthalmic solution Place 1 drop into both eyes at bedtime.  01/16/14  Yes [provider]  ?levothyroxine (SYNTHROID, LEVOTHROID) 88 MCG tablet Take 88 mcg by mouth daily before breakfast.  03/11/17  Yes [provider]  ?MELATONIN CR PO Take by mouth.   Yes [provider]  ?meloxicam (MOBIC) 15 MG tablet Take 1 tablet (15 mg  total) by mouth daily. 04/20/21  Yes Abernathy, Yetta Flock, NP  ?montelukast (SINGULAIR) 10 MG tablet Take 1 tablet (10 mg total) by mouth daily. 08/23/20  Yes McDonough, Si Gaul, PA-C  ?Multiple Vitamins-Minerals (MULTIVITAMIN GUMMIES WOMENS) CHEW Chew 2 tablets by mouth daily.   Yes [provider]  ?naproxen (NAPROSYN) 500 MG tablet Take 500 mg by mouth daily as needed.   Yes [provider]  ?ondansetron (ZOFRAN ODT) 4 MG disintegrating tablet Take 1 tablet (4 mg total) by mouth every 8 (eight) hours as needed. 06/25/20  Yes Menshew, Dannielle Karvonen, PA-C  ?pantoprazole (PROTONIX) 40 MG tablet TAKE 1 TABLET BY MOUTH DAILY AS NEEDED 03/22/21  Yes Abernathy, Alyssa, NP  ?pravastatin (PRAVACHOL) 20 MG tablet TAKE 1 TABLET(20 MG) BY MOUTH AT BEDTIME 05/15/21  Yes Abernathy, Alyssa, NP  ?sacubitril-valsartan (ENTRESTO) 24-26 MG Take 1 tablet by mouth 2 (two) times daily. 09/29/20  Yes Alisa Graff, FNP  ?timolol (TIMOPTIC) 0.25 % ophthalmic solution Place 1 drop into both eyes 2 (two) times daily.  07/30/09  Yes [provider]  ?triamcinolone ointment (KENALOG) 0.1 % Apply 1 application topically 2 (two) times daily. 04/20/21  Yes Abernathy, Yetta Flock, NP  ?albuterol (VENTOLIN HFA) 108 (90 Base) MCG/ACT inhaler Inhale into the lungs every 6 (six) hours as needed for wheezing or shortness of breath. ?Patient not taking: Reported on 06/02/2021    [provider]  ?fluticasone (FLOVENT HFA) 110 MCG/ACT inhaler Inhale 1 puff into the lungs 2 (two) times daily. ?Patient not taking: Reported on 06/02/2021 09/27/20   [name], MD  ? ? ?Review of Systems  ?Constitutional:  Positive for fatigue (feels fatigued after PT). Negative for appetite change.  ?HENT:  Positive for rhinorrhea. Negative for congestion, postnasal drip and sore throat.   ?Eyes: Negative.   ?Respiratory:  Positive for cough (dry) and shortness of breath (very little). Negative for chest tightness.   ?Cardiovascular:  Positive for  chest pain (rarely) and palpitations. Negative for leg swelling.  ?Gastrointestinal:  Negative for abdominal distention and abdominal pain.  ?Endocrine: Negative.   ?Genitourinary: Negative.   ?Musculoskeletal:  Positive for arthralgias (hands/ thumb) and back pain.  ?Skin: Negative.   ?Allergic/Immunologic: Negative.   ?Neurological:  Negative for dizziness and light-headedness.  ?Hematological:  Negative for adenopathy. Does not bruise/bleed easily.  ?Psychiatric/Behavioral:  Positive for sleep disturbance (sleeping on 2 pillows; falling asleep on the couch). Negative for dysphoric mood. The patient is not nervous/anxious.   ? ?Vitals:  ? 06/02/21 1105  ?BP: (!) 123/54  ?Pulse: 60  ?Resp: 14  ?SpO2: 100%  ?Weight: 139 lb 6 oz (63.2 kg)  ?Height: '4\' 9"'$  (1.448 m)  ? ?Wt Readings from Last 3 Encounters:  ?06/02/21  139 lb 6 oz (63.2 kg)  ?05/31/21 141 lb (64 kg)  ?04/20/21 141 lb (64 kg)  ? ?Lab Results  ?Component Value Date  ? CREATININE 1.62 (H) 01/11/2021  ? CREATININE 1.31 (H) 12/09/2020  ? CREATININE 1.32 (H) 09/28/2020  ? ? ?Physical Exam ?Vitals and nursing note reviewed.  ?Constitutional:   ?   Appearance: Normal appearance.  ?HENT:  ?   Head: Normocephalic and atraumatic.  ?   Right Ear: Decreased hearing noted.  ?   Left Ear: Decreased hearing noted.  ?Cardiovascular:  ?   Rate and Rhythm: Regular rhythm. Bradycardia present.  ?Pulmonary:  ?   Effort: Pulmonary effort is normal. No respiratory distress.  ?   Breath sounds: No wheezing or rales.  ?Abdominal:  ?   General: There is no distension.  ?   Palpations: Abdomen is soft.  ?   Tenderness: There is no abdominal tenderness.  ?Musculoskeletal:     ?   General: No tenderness.  ?   Cervical back: Normal range of motion and neck supple.  ?   Right lower leg: No edema.  ?   Left lower leg: No edema.  ?Skin: ?   General: Skin is warm and dry.  ?Neurological:  ?   General: No focal deficit present.  ?   Mental Status: She is alert and oriented to person,  place, and time.  ?Psychiatric:     ?   Mood and Affect: Mood normal.     ?   Behavior: Behavior normal.     ?   Thought Content: Thought content normal.  ? ?Assessment & Plan: ? ?1: Chronic heart failure with

## 2021-06-01 NOTE — Therapy (Signed)
[facility] ?[address] ?[place], [place], [address] ?Phone: [phone]   Fax:  [phone] ? ?Physical Therapy Treatment ? ?Patient Details  ?Name: Stacey [name] ?MRN: [id number] ?Date of Birth: Oct 10, 1945 ?Referring Provider (PT): Abernathy NP ? ? ?Encounter Date: 06/01/2021 ? ? PT End of Session - 06/01/21 1427   ? ? Visit Number 14   ? Number of Visits 29   ? Date for PT Re-Evaluation 07/25/21   ? Progress Note Due on Visit 20   ? PT Start Time 1346   ? PT Stop Time 1430   ? PT Time Calculation (min) 44 min   ? Activity Tolerance Patient tolerated treatment well   ? Behavior During Therapy [facility] for tasks assessed/performed   ? ?  ?  ? ?  ? ? ?Past Medical History:  ?Diagnosis Date  ? CHF (congestive heart failure) ([place])   ? Chronic kidney disease   ? COPD (chronic obstructive pulmonary disease) ([place])   ? Hypertension   ? Sarcoidosis   ? Thyroid disease   ? ? ?Past Surgical History:  ?Procedure Laterality Date  ? CATARACT EXTRACTION    ? EYE SURGERY Right 07/09/2019  ? GALLBLADDER SURGERY    ? KNEE SURGERY    ? thyroidectomy    ? ? ?There were no vitals filed for this visit. ? ? Subjective Assessment - 06/01/21 1426   ? ? Subjective Patient reports 4/10 back pain upon arrival. She was achy and sore following last session with relief of pain occuring today.   ? 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 She reports no specific event or anything that exacerbated her pain, that she just wanted to try PT because she has been suffering with pain for so long. She has been managing her pain with medications through the pain clinic for "about 15 years" and about 3 steroid injections; no surgical intervention. Patient reports her LBP is R sided and radiates into the R buttock and down the R leg. Her back pain is described as sharp in the low back and achy pain down the legs. Denies  numbness, tingling, or electrical sensation (does report some occassional pain in L toes and L fingers). Current pain 7/10; best 0/10; worst 10/10. Pain is exacerbated by any walking/standing >17mns, cooking, bending foward, lifting, and carrying. Can reduce pain with rest, and stopping activity and taking naproxen before her pain increases. Pt completes bathing, dressing, feeding ind. She cooks but modifies this activity to decrease upright time, husband helps with heavy household chores. Is driving and completing limited community activity. Pt is retired, enjoys dancing with her church dance ministry once a month, keeping her 2 y/o great nephew. Lives at home with her husband in a one story home with 3 steps to enter with a handrail. She has a walk in shower with grab bars, denies issues navigating her home. Reports 1 fall in July 2022 when she stepped in a hole, denies fear of falling. Pt denies N/V, B&B changes, unexplained weight fluctuation, saddle paresthesia, fever, night sweats, or unrelenting night pain at this time.   ? Limitations Lifting;House hold activities;Standing;Walking   ? How long can you sit comfortably? unlimited   ? How long can you stand comfortably? 396ms   ? How long can you walk comfortably? 3043m   ? Diagnostic tests none   ? Patient Stated Goals Would  like to be able to complete walking regimen and cooking/cleaning without pain   ? Currently in Pain? Yes   ? Pain Score 4    ? Pain Location Back   ? Pain Orientation Right;Lower   ? Pain Descriptors / Indicators Aching   ? Pain Type Chronic pain   ? Pain Onset More than a month ago   ? ?  ?  ? ?  ? ? ? ?**LOW BACK** ?  ?  ?Therex  ?NuStep, seat 3 and UE 9, Level 3 for gentle lumbar spine mobility and strengthening for 5 min.  ?Prone press up to elbows for lumbar extension, x10; ?Prone hip extension, 3x10; ?Thoracolumbar rotation, x12 each side with 5 second hold; ?Bridge with YRB hip abduction, 3x10; ?Sidelying clamshell red RB, 2x10  each side; ?Seated thoracolumbar extension, x10 reps; ? ?  ?  ?Manual Therapy ?Hamstring stretch RLE, 2x60 seconds ?Piriformis stretch RLE,  2x60 seconds ?Sciatic nerve flossing RLE, x10 reps  ? ?  ?  ?Clinical Impression: Pt pleasant during session. She reported a sore ache following last session that has just improved today. With palpation, PT discovered tenderness directly along lumbar spinous processes and superior glute structures on right side. Did not perform spinal mobs due to point tenderness over bony prominences. Gentle strengthening and stretching was performed with pt reporting moderate challenge and fatigue. Extension continues to feel best per pt. Patient will continue to benefit from skilled PT to address weakness and pain deficits of the low back to return to PLOF and allow pt to participate in hobbies for improved QOL. ? ? ? ? ? ? ? ? ? PT Short Term Goals - 05/19/21 1656   ? ?  ? PT SHORT TERM GOAL #1  ? Title Pt will be independent with HEP in order to improve strength and decrease back pain in order to improve pain-free function at home and work.   ? Baseline 03/29/21 HEP given   ? Time 4   ? Period Weeks   ? Status Achieved   ? ?  ?  ? ?  ? ? ? ? PT Long Term Goals - 05/30/21 1417   ? ?  ? PT LONG TERM GOAL #1  ? Title Pt will decrease worst back pain as reported on NPRS by at least 2 points in order to demonstrate clinically significant reduction in back pain.   ? Baseline 04/08/21 10/10 LBP with activity. 05/30/21 10/10   ? Time 8   ? Period Weeks   ? Status On-going   ? Target Date 07/25/21   ?  ? PT LONG TERM GOAL #2  ? Title Pt will decrease 5TSTS to at least 12 seconds in order to demonstrate clinically significant improvement in LE strength for age-matched strength norms   ? Baseline 03/29/21 18sec; 05/19/21 10.94 sec   ? Time 8   ? Period Weeks   ? Status Achieved   ?  ? PT LONG TERM GOAL #3  ? Title Pt will increase 10MWT to at least 1.0 m/s in order to demonstrate community ambulation speed  with less likelihood of an adverse event   ? Baseline 03/29/21 self selected:0.42ms fastest: 0.729m. 05/19/21 fastest: 1.1 m/s   ? Time 8   ? Period Weeks   ? Status Achieved   ?  ? PT LONG TERM GOAL #4  ? Title Patient will increase FOTO score to 49 to demonstrate predicted increase in functional mobility to complete ADLs   ?  Baseline 03/29/21 40; 3/2: 58   ? Time 8   ? Period Weeks   ? Status Achieved   ?  ? PT LONG TERM GOAL #5  ? Title Pt will improve Oswestry score 7 points or greater to demonstrate subjective improvement in QOL related to back pain.   ? Baseline 05/30/21: 22% (score: 11)   ? Time 8   ? Period Weeks   ? Status New   ? Target Date 07/25/21   ?  ? Additional Long Term Goals  ? Additional Long Term Goals Yes   ?  ? PT LONG TERM GOAL #6  ? Title Pt will report being able to stand for 2 hours or greater in order to perform household work such as cooking dinner and cleaning dishes afterwards with no more than 8/10 pain (decrease in NPS by 2 points per MCID).   ? Baseline 05/30/21: able to stand for 30-45 minutes with 10/10 pain   ? Time 8   ? Period Weeks   ? Status New   ? Target Date 07/25/21   ? ?  ?  ? ?  ? ? ? ? ? ? ? ? Plan - 06/01/21 1430   ? ? Clinical Impression Statement Pt pleasant during session. She reported a sore ache following last session that has just improved today. With palpation, PT discovered tenderness directly along lumbar spinous processes and superior glute structures on right side. Did not perform spinal mobs due to point tenderness over bony prominences. Gentle strengthening and stretching was performed with pt reporting moderate challenge and fatigue. Extension continues to feel best per pt. Patient will continue to benefit from skilled PT to address weakness and pain deficits of the low back to return to PLOF and allow pt to participate in hobbies for improved QOL.   ? Personal Factors and Comorbidities Comorbidity 3+;Past/Current Experience;Time since onset of  injury/illness/exacerbation;Fitness   ? Comorbidities COPD, CHF, HTN, CKD   ? Examination-Activity Limitations Bed Mobility;Carry;Squat;Lift;Reach Overhead;Stand;Stairs;Transfers   ? Examination-Participation Restrictio

## 2021-06-02 ENCOUNTER — Encounter: Payer: Self-pay | Admitting: Family

## 2021-06-02 ENCOUNTER — Ambulatory Visit: Payer: Medicare Other | Attending: Family | Admitting: Family

## 2021-06-02 ENCOUNTER — Ambulatory Visit: Payer: Medicare Other | Admitting: Nurse Practitioner

## 2021-06-02 VITALS — BP 123/54 | HR 60 | Resp 14 | Ht <= 58 in | Wt 139.4 lb

## 2021-06-02 DIAGNOSIS — J449 Chronic obstructive pulmonary disease, unspecified: Secondary | ICD-10-CM | POA: Insufficient documentation

## 2021-06-02 DIAGNOSIS — Z79899 Other long term (current) drug therapy: Secondary | ICD-10-CM | POA: Diagnosis not present

## 2021-06-02 DIAGNOSIS — Z7901 Long term (current) use of anticoagulants: Secondary | ICD-10-CM | POA: Insufficient documentation

## 2021-06-02 DIAGNOSIS — Z7984 Long term (current) use of oral hypoglycemic drugs: Secondary | ICD-10-CM | POA: Insufficient documentation

## 2021-06-02 DIAGNOSIS — R0602 Shortness of breath: Secondary | ICD-10-CM | POA: Insufficient documentation

## 2021-06-02 DIAGNOSIS — R079 Chest pain, unspecified: Secondary | ICD-10-CM | POA: Diagnosis not present

## 2021-06-02 DIAGNOSIS — N189 Chronic kidney disease, unspecified: Secondary | ICD-10-CM | POA: Insufficient documentation

## 2021-06-02 DIAGNOSIS — M549 Dorsalgia, unspecified: Secondary | ICD-10-CM | POA: Insufficient documentation

## 2021-06-02 DIAGNOSIS — I5032 Chronic diastolic (congestive) heart failure: Secondary | ICD-10-CM | POA: Diagnosis not present

## 2021-06-02 DIAGNOSIS — I10 Essential (primary) hypertension: Secondary | ICD-10-CM

## 2021-06-02 DIAGNOSIS — I130 Hypertensive heart and chronic kidney disease with heart failure and stage 1 through stage 4 chronic kidney disease, or unspecified chronic kidney disease: Secondary | ICD-10-CM | POA: Diagnosis not present

## 2021-06-02 DIAGNOSIS — I509 Heart failure, unspecified: Secondary | ICD-10-CM | POA: Diagnosis not present

## 2021-06-02 DIAGNOSIS — E210 Primary hyperparathyroidism: Secondary | ICD-10-CM | POA: Diagnosis not present

## 2021-06-02 DIAGNOSIS — R002 Palpitations: Secondary | ICD-10-CM | POA: Diagnosis not present

## 2021-06-02 DIAGNOSIS — G8929 Other chronic pain: Secondary | ICD-10-CM | POA: Diagnosis not present

## 2021-06-02 NOTE — Patient Instructions (Signed)
Continue weighing daily and call for an overnight weight gain of 3 pounds or more or a weekly weight gain of more than 5 pounds.   If you have voicemail, please make sure your mailbox is cleaned out so that we may leave a message and please make sure to listen to any voicemails.     

## 2021-06-06 ENCOUNTER — Other Ambulatory Visit: Payer: Self-pay

## 2021-06-06 ENCOUNTER — Ambulatory Visit: Payer: Medicare Other

## 2021-06-06 DIAGNOSIS — R2689 Other abnormalities of gait and mobility: Secondary | ICD-10-CM | POA: Diagnosis not present

## 2021-06-06 DIAGNOSIS — G8929 Other chronic pain: Secondary | ICD-10-CM

## 2021-06-06 DIAGNOSIS — M545 Low back pain, unspecified: Secondary | ICD-10-CM | POA: Diagnosis not present

## 2021-06-06 NOTE — Therapy (Signed)
[facility] ?[address] ?[place], [place], [address] ?Phone: [phone]   Fax:  [phone] ? ?Physical Therapy Treatment ? ?Patient Details  ?Name: Stacey [name] ?MRN: [id number] ?Date of Birth: 23-Jan-1946 ?Referring Provider (PT): Abernathy NP ? ? ?Encounter Date: 06/06/2021 ? ? PT End of Session - 06/06/21 1308   ? ? Visit Number 15   ? Number of Visits 29   ? Date for PT Re-Evaluation 07/25/21   ? Authorization Type Medicare primary; [employer] Secondary   ? Authorization Time Period 05/30/21-07/25/21   ? Progress Note Due on Visit 20   ? PT Start Time 1302   ? PT Stop Time 1342   ? PT Time Calculation (min) 40 min   ? Activity Tolerance Patient tolerated treatment well;No increased pain;Patient limited by fatigue   ? Behavior During Therapy [facility] for tasks assessed/performed   ? ?  ?  ? ?  ? ? ?Past Medical History:  ?Diagnosis Date  ? CHF (congestive heart failure) ([place])   ? Chronic kidney disease   ? COPD (chronic obstructive pulmonary disease) ([place])   ? Hypertension   ? Sarcoidosis   ? Thyroid disease   ? ? ?Past Surgical History:  ?Procedure Laterality Date  ? CATARACT EXTRACTION    ? EYE SURGERY Right 07/09/2019  ? GALLBLADDER SURGERY    ? KNEE SURGERY    ? thyroidectomy    ? ? ?There were no vitals filed for this visit. ? ? Subjective Assessment - 06/06/21 1306   ? ? Subjective Pt doing well today, reports no significant updates since last visit. Pt says she had a lot of pain on Thursday, but has since felt better.  Pt reports her HEP is being performed without issue.   ? 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 She reports no specific event or anything that exacerbated her pain, that she just wanted to try PT because she has been suffering with pain for so long. She has been managing her pain with medications through the pain clinic for "about 15 years" and about 3 steroid  injections; no surgical intervention. Patient reports her LBP is R sided and radiates into the R buttock and down the R leg. Her back pain is described as sharp in the low back and achy pain down the legs. Denies numbness, tingling, or electrical sensation (does report some occassional pain in L toes and L fingers). Current pain 7/10; best 0/10; worst 10/10. Pain is exacerbated by any walking/standing >63mns, cooking, bending foward, lifting, and carrying. Can reduce pain with rest, and stopping activity and taking naproxen before her pain increases. Pt completes bathing, dressing, feeding ind. She cooks but modifies this activity to decrease upright time, husband helps with heavy household chores. Is driving and completing limited community activity. Pt is retired, enjoys dancing with her church dance ministry once a month, keeping her 2 y/o great nephew. Lives at home with her husband in a one story home with 3 steps to enter with a handrail. She has a walk in shower with grab bars, denies issues navigating her home. Reports 1 fall in July 2022 when she stepped in a hole, denies fear of falling. Pt denies N/V, B&B changes, unexplained weight fluctuation, saddle paresthesia, fever, night sweats, or unrelenting night pain at this time.   ? Currently in Pain? No/denies   ? ?  ?  ? ?  ? ? ?  Therex  ?NuStep, seat 3 and UE 9, Level 3 for gentle lumbar spine mobility and strengthening for 5 min.  ? ?Prone press up to elbows for lumbar extension, x10; (pt educated on this due to lack of familiarity) ?Prone hip extension, 1x10; (pt educated on this due to lack of familiarity) ? ?Thoracolumbar rotation, x12 each side with 5 second hold (deferred this session)  ? ?Bridge with RTB clam, 2x10; ?Sidelying clamshell red RB, 2x10 each side; ?Hooklying Straightleg raise from 30 degrees, BTB 1x10 bilat  ? ?10x STS hands free ? ?222f overground AMB at moderate pace, no device  ? ?10x STS hands free ? ?2087foverground AMB at  moderate pace, no device  ? ? ? ? ? PT Education - 06/06/21 1307   ? ? Education Details ways to perform her exercises with form   ? Person(s) Educated Patient   ? Methods Explanation   ? Comprehension Verbalized understanding;Returned demonstration   ? ?  ?  ? ?  ? ? ? PT Short Term Goals - 05/19/21 1656   ? ?  ? PT SHORT TERM GOAL #1  ? Title Pt will be independent with HEP in order to improve strength and decrease back pain in order to improve pain-free function at home and work.   ? Baseline 03/29/21 HEP given   ? Time 4   ? Period Weeks   ? Status Achieved   ? ?  ?  ? ?  ? ? ? ? PT Long Term Goals - 05/30/21 1417   ? ?  ? PT LONG TERM GOAL #1  ? Title Pt will decrease worst back pain as reported on NPRS by at least 2 points in order to demonstrate clinically significant reduction in back pain.   ? Baseline 04/08/21 10/10 LBP with activity. 05/30/21 10/10   ? Time 8   ? Period Weeks   ? Status On-going   ? Target Date 07/25/21   ?  ? PT LONG TERM GOAL #2  ? Title Pt will decrease 5TSTS to at least 12 seconds in order to demonstrate clinically significant improvement in LE strength for age-matched strength norms   ? Baseline 03/29/21 18sec; 05/19/21 10.94 sec   ? Time 8   ? Period Weeks   ? Status Achieved   ?  ? PT LONG TERM GOAL #3  ? Title Pt will increase 10MWT to at least 1.0 m/s in order to demonstrate community ambulation speed with less likelihood of an adverse event   ? Baseline 03/29/21 self selected:0.6741mfastest: 0.9m70m3/2/23 fastest: 1.1 m/s   ? Time 8   ? Period Weeks   ? Status Achieved   ?  ? PT LONG TERM GOAL #4  ? Title Patient will increase FOTO score to 49 to demonstrate predicted increase in functional mobility to complete ADLs   ? Baseline 03/29/21 40; 3/2: 58   ? Time 8   ? Period Weeks   ? Status Achieved   ?  ? PT LONG TERM GOAL #5  ? Title Pt will improve Oswestry score 7 points or greater to demonstrate subjective improvement in QOL related to back pain.   ? Baseline 05/30/21: 22% (score:  11)   ? Time 8   ? Period Weeks   ? Status New   ? Target Date 07/25/21   ?  ? Additional Long Term Goals  ? Additional Long Term Goals Yes   ?  ? PT LONG TERM GOAL #6  ?  Title Pt will report being able to stand for 2 hours or greater in order to perform household work such as cooking dinner and cleaning dishes afterwards with no more than 8/10 pain (decrease in NPS by 2 points per MCID).   ? Baseline 05/30/21: able to stand for 30-45 minutes with 10/10 pain   ? Time 8   ? Period Weeks   ? Status New   ? Target Date 07/25/21   ? ?  ?  ? ?  ? ? ? ? ? ? ? ? Plan - 06/06/21 1316   ? ? Clinical Impression Statement Pt doing great this session, no pain during interventions. PT showing signs of increased strength and activity tolerance to conditioning. Pt advancing toward goals of treatment.   ? Personal Factors and Comorbidities Comorbidity 3+;Past/Current Experience;Time since onset of injury/illness/exacerbation;Fitness   ? Comorbidities COPD, CHF, HTN, CKD   ? Examination-Activity Limitations Bed Mobility;Carry;Squat;Lift;Reach Overhead;Stand;Stairs;Transfers   ? Examination-Participation Restrictions Community Activity;Meal Prep;Laundry;Cleaning;[place] [employer] Work   ? Stability/Clinical Decision Making Evolving/Moderate complexity   ? Clinical Decision Making Moderate   ? Rehab Potential Good   ? PT Frequency 2x / week   ? PT Duration 8 weeks   ? PT Treatment/Interventions Fish farm manager;Therapeutic activities;Neuromuscular re-education;Manual techniques;Spinal Manipulations;ADLs/Self Care Home Management;Passive range of motion;Therapeutic exercise;Functional mobility training;Stair training;Cryotherapy;Iontophoresis '4mg'$ /ml Dexamethasone;Aquatic Therapy;DME Instruction;Balance training;Patient/family education;Dry needling;Joint Manipulations;Splinting;Taping   ? PT Next Visit Plan no updates, conitnue with POC   ? PT Home Exercise Plan STS, supine piriformis, side steps  with YTB, and SKTC stretch   ? Consulted and Agree with Plan of Care Patient   ? ?  ?  ? ?  ? ? ?Patient will benefit from skilled therapeutic intervention in order to improve the following deficits and impairment

## 2021-06-07 ENCOUNTER — Other Ambulatory Visit: Payer: Self-pay

## 2021-06-07 ENCOUNTER — Encounter: Payer: Self-pay | Admitting: Nurse Practitioner

## 2021-06-07 ENCOUNTER — Ambulatory Visit (INDEPENDENT_AMBULATORY_CARE_PROVIDER_SITE_OTHER): Payer: Medicare Other | Admitting: Nurse Practitioner

## 2021-06-07 VITALS — BP 130/66 | HR 67 | Temp 98.6°F | Resp 16 | Ht <= 58 in | Wt 142.8 lb

## 2021-06-07 DIAGNOSIS — I10 Essential (primary) hypertension: Secondary | ICD-10-CM

## 2021-06-07 DIAGNOSIS — Z23 Encounter for immunization: Secondary | ICD-10-CM | POA: Diagnosis not present

## 2021-06-07 DIAGNOSIS — M722 Plantar fascial fibromatosis: Secondary | ICD-10-CM | POA: Diagnosis not present

## 2021-06-07 DIAGNOSIS — M792 Neuralgia and neuritis, unspecified: Secondary | ICD-10-CM | POA: Diagnosis not present

## 2021-06-07 MED ORDER — GABAPENTIN 100 MG PO CAPS: 200.0000 mg | ORAL_CAPSULE | Freq: Every day | ORAL | 2 refills | Status: AC

## 2021-06-07 MED ORDER — ZOSTER VAC RECOMB ADJUVANTED 50 MCG/0.5ML IM SUSR: 0.5000 mL | Freq: Once | INTRAMUSCULAR | 0 refills | Status: AC

## 2021-06-07 MED ORDER — HYDRALAZINE HCL 50 MG PO TABS: ORAL_TABLET | ORAL | 1 refills | Status: DC

## 2021-06-07 MED ORDER — MELOXICAM 15 MG PO TABS: 15.0000 mg | ORAL_TABLET | Freq: Every day | ORAL | 0 refills | Status: DC

## 2021-06-07 NOTE — Progress Notes (Signed)
[facility] ?[address] ?[place], [place] [address] ? ?Internal MEDICINE  ?Office Visit Note ? ?Patient Name: Stacey [name] ? [id number]  ?[id number] ? ?Date of Service: 06/07/2021 ? ?Chief Complaint  ?Patient presents with  ? Follow-up  ?  Left hand has been going numb around finger tips   ? Medication Refill  ? ? ?HPI ?Stacey [name] presents for a follow-up visit for hypertension and medication refills.  Patient's diabetes is managed by Dr. Gabriel [name].  She had echocardiogram done on March 7 that showed an ejection fraction of 50% with moderate LVH, moderate MR and mild AS.  Her ejection fraction has improved when compared to her echocardiogram from April last year.  She is followed by the heart failure clinic with Stacey Price NP.  She is due for her tetanus vaccine which will be sent to her pharmacy.   ?--She reports having issues with numbness in the fingertips of her left hand.  She denies any pain in her left hand, wrist or arm.  She denies any decrease in grip or weakness in her hand wrist or arm. ?She reports that she did drop her fluid pill to 1 a day because she was told by one of her specialist that she could decrease it if she needed to but then she noticed an increase in the swelling of her legs and feet so she went back to taking 2 of her fluid pills daily which is how the prescription is already ordered by Stacey Price NP ? ? ? ?Current Medication: ?Outpatient Encounter Medications as of 06/07/2021  ?Medication Sig  ? albuterol (VENTOLIN HFA) 108 (90 Base) MCG/ACT inhaler Inhale into the lungs every 6 (six) hours as needed for wheezing or shortness of breath.  ? carvedilol (COREG) 6.25 MG tablet Take 1 tablet (6.25 mg total) by mouth 2 (two) times daily with a meal.  ? cetirizine (ZYRTEC) 10 MG tablet TAKE 1 TABLET(10 MG) BY MOUTH DAILY  ? cloNIDine (CATAPRES) 0.1 MG tablet Take 1 tablet (0.1 mg total) by mouth 2 (two) times daily.  ? dapagliflozin propanediol (FARXIGA) 10 MG TABS tablet Take  1 tablet (10 mg total) by mouth daily before breakfast.  ? fluticasone (FLONASE) 50 MCG/ACT nasal spray Place 2 sprays into both nostrils daily.  ? fluticasone (FLOVENT HFA) 110 MCG/ACT inhaler Inhale 1 puff into the lungs 2 (two) times daily.  ? furosemide (LASIX) 20 MG tablet Take 2 tablets (40 mg total) by mouth daily.  ? latanoprost (XALATAN) 0.005 % ophthalmic solution Place 1 drop into both eyes at bedtime.   ? levothyroxine (SYNTHROID, LEVOTHROID) 88 MCG tablet Take 88 mcg by mouth daily before breakfast.   ? MELATONIN CR PO Take by mouth.  ? montelukast (SINGULAIR) 10 MG tablet Take 1 tablet (10 mg total) by mouth daily.  ? Multiple Vitamins-Minerals (MULTIVITAMIN GUMMIES WOMENS) CHEW Chew 2 tablets by mouth daily.  ? naproxen (NAPROSYN) 500 MG tablet Take 500 mg by mouth daily as needed.  ? ondansetron (ZOFRAN ODT) 4 MG disintegrating tablet Take 1 tablet (4 mg total) by mouth every 8 (eight) hours as needed.  ? pantoprazole (PROTONIX) 40 MG tablet TAKE 1 TABLET BY MOUTH DAILY AS NEEDED  ? pravastatin (PRAVACHOL) 20 MG tablet TAKE 1 TABLET(20 MG) BY MOUTH AT BEDTIME  ? sacubitril-valsartan (ENTRESTO) 24-26 MG Take 1 tablet by mouth 2 (two) times daily.  ? timolol (TIMOPTIC) 0.25 % ophthalmic solution Place 1 drop into both eyes 2 (two) times daily.   ?  triamcinolone ointment (KENALOG) 0.1 % Apply 1 application topically 2 (two) times daily.  ? [DISCONTINUED] gabapentin (NEURONTIN) 100 MG capsule TAKE 2 CAPSULES(200 MG) BY MOUTH AT BEDTIME  ? [DISCONTINUED] hydrALAZINE (APRESOLINE) 50 MG tablet TAKE 2 TABLET BY MOUTH THREE TIMES DAILY  ? [DISCONTINUED] meloxicam (MOBIC) 15 MG tablet Take 1 tablet (15 mg total) by mouth daily.  ? [DISCONTINUED] Zoster Vaccine Adjuvanted [facility]) injection Inject 0.5 mLs into the muscle once.  ? gabapentin (NEURONTIN) 100 MG capsule Take 2 capsules (200 mg total) by mouth at bedtime.  ? hydrALAZINE (APRESOLINE) 50 MG tablet TAKE 2 TABLET BY MOUTH THREE TIMES DAILY  ?  meloxicam (MOBIC) 15 MG tablet Take 1 tablet (15 mg total) by mouth daily.  ? Zoster Vaccine Adjuvanted [facility]) injection Inject 0.5 mLs into the muscle once for 1 dose.  ? ?No facility-administered encounter medications on file as of 06/07/2021.  ? ? ?Surgical History: ?Past Surgical History:  ?Procedure Laterality Date  ? CATARACT EXTRACTION    ? EYE SURGERY Right 07/09/2019  ? GALLBLADDER SURGERY    ? KNEE SURGERY    ? thyroidectomy    ? ? ?Medical History: ?Past Medical History:  ?Diagnosis Date  ? CHF (congestive heart failure) ([place])   ? Chronic kidney disease   ? COPD (chronic obstructive pulmonary disease) ([place])   ? Hypertension   ? Sarcoidosis   ? Thyroid disease   ? ? ?Family History: ?Family History  ?Problem Relation Age of Onset  ? Hypertension Mother   ? Diabetes Mother   ? Anuerysm Mother   ? Diabetes Father   ? Hypertension Father   ? Prostate cancer Neg Hx   ? Bladder Cancer Neg Hx   ? Kidney cancer Neg Hx   ? Breast cancer Neg Hx   ? ? ?Social History  ? ?Socioeconomic History  ? Marital status: Married  ?  Spouse name: Not on file  ? Number of children: Not on file  ? Years of education: Not on file  ? Highest education level: Not on file  ?Occupational History  ? Not on file  ?Tobacco Use  ? Smoking status: Never  ? Smokeless tobacco: Never  ?Vaping Use  ? Vaping Use: Never used  ?Substance and Sexual Activity  ? Alcohol use: No  ? Drug use: Not Currently  ? Sexual activity: Not on file  ?Other Topics Concern  ? Not on file  ?Social History Narrative  ? Not on file  ? ?Social Determinants of Health  ? ?Financial Resource Strain: Low Risk   ? Difficulty of Paying Living Expenses: Not very hard  ?Food Insecurity: Not on file  ?Transportation Needs: Not on file  ?Physical Activity: Not on file  ?Stress: Not on file  ?Social Connections: Not on file  ?Intimate Partner Violence: Not on file  ? ? ? ? ?Review of Systems  ?Constitutional:  Negative for chills, fatigue and unexpected weight change.   ?HENT:  Negative for congestion, rhinorrhea, sneezing and sore throat.   ?Eyes:  Negative for redness.  ?Respiratory: Negative.  Negative for cough, chest tightness, shortness of breath and wheezing.   ?Cardiovascular: Negative.  Negative for chest pain and palpitations.  ?Gastrointestinal: Negative.  Negative for abdominal pain, constipation, diarrhea, nausea and vomiting.  ?Genitourinary:  Negative for dysuria and frequency.  ?Musculoskeletal:  Negative for arthralgias, back pain, joint swelling and neck pain.  ?Skin:  Negative for rash.  ?Neurological:  Positive for numbness (Left hand fingertips). Negative for tremors  and weakness.  ?Hematological:  Negative for adenopathy. Does not bruise/bleed easily.  ?Psychiatric/Behavioral:  Negative for behavioral problems (Depression), sleep disturbance and suicidal ideas. The patient is not nervous/anxious.   ? ?Vital Signs: ?BP (!) 142/70   Pulse 67   Temp 98.6 ?F (37 ?C)   Resp 16   Ht '4\' 9"'$  (1.448 m)   Wt 142 lb 12.8 oz (64.8 kg)   SpO2 98%   BMI 30.90 kg/m?  ? ? ?Physical Exam ?Vitals reviewed.  ?Constitutional:   ?   General: She is not in acute distress. ?   Appearance: Normal appearance. She is obese. She is not ill-appearing.  ?HENT:  ?   Head: Normocephalic and atraumatic.  ?Eyes:  ?   Pupils: Pupils are equal, round, and reactive to light.  ?Cardiovascular:  ?   Rate and Rhythm: Normal rate and regular rhythm.  ?Pulmonary:  ?   Effort: Pulmonary effort is normal. No respiratory distress.  ?Neurological:  ?   Mental Status: She is alert and oriented to person, place, and time.  ?Psychiatric:     ?   Mood and Affect: Mood normal.     ?   Behavior: Behavior normal.  ? ? ? ? ? ?Assessment/Plan: ?1. Essential hypertension ?Stable, refills ordered ?- hydrALAZINE (APRESOLINE) 50 MG tablet; TAKE 2 TABLET BY MOUTH THREE TIMES DAILY  Dispense: 540 tablet; Refill: 1 ? ?2. Plantar fasciitis of right foot ?Meloxicam has been helping with the pain of her right foot,  refills ordered. ?- meloxicam (MOBIC) 15 MG tablet; Take 1 tablet (15 mg total) by mouth daily.  Dispense: 30 tablet; Refill: 0 ? ?3. Neuralgia and neuritis ?Patient takes gabapentin to help with nerve pain.  Refills or

## 2021-06-08 ENCOUNTER — Encounter: Payer: Self-pay | Admitting: Nurse Practitioner

## 2021-06-08 ENCOUNTER — Ambulatory Visit: Payer: Medicare Other

## 2021-06-08 DIAGNOSIS — G8929 Other chronic pain: Secondary | ICD-10-CM

## 2021-06-08 DIAGNOSIS — R2689 Other abnormalities of gait and mobility: Secondary | ICD-10-CM | POA: Diagnosis not present

## 2021-06-08 DIAGNOSIS — M545 Low back pain, unspecified: Secondary | ICD-10-CM | POA: Diagnosis not present

## 2021-06-08 NOTE — Therapy (Signed)
[place] [facility] [address], [place], [address] Phone: [phone]   Fax:  [phone]  Physical Therapy Treatment  Patient Details  Name: Stacey [name] MRN: [id number] Date of Birth: 1945-10-18 Referring Provider (PT): Abernathy NP   Encounter Date: 06/08/2021   PT End of Session - 06/08/21 1315     Visit Number 16    Number of Visits 29    Date for PT Re-Evaluation 07/25/21    Authorization Type Medicare primary; [employer] Secondary    Authorization Time Period 05/30/21-07/25/21    Progress Note Due on Visit 20    PT Start Time 1302    PT Stop Time 1340    PT Time Calculation (min) 38 min    Activity Tolerance Patient tolerated treatment well;No increased pain;Patient limited by fatigue    Behavior During Therapy [facility] for tasks assessed/performed             Past Medical History:  Diagnosis Date   CHF (congestive heart failure) (HCC)    Chronic kidney disease    COPD (chronic obstructive pulmonary disease) (HCC)    Hypertension    Sarcoidosis    Thyroid disease     Past Surgical History:  Procedure Laterality Date   CATARACT EXTRACTION     EYE SURGERY Right 07/09/2019   GALLBLADDER SURGERY     KNEE SURGERY     thyroidectomy      There were no vitals filed for this visit.   Subjective Assessment - 06/08/21 1311     Subjective No pain today. Pt had some low bakc pain immediately following session 2 days prior which improved by next day. Her knee hurt some before dance rehearsal. HEP going well at home.    Pertinent History Smita Haugh comes to [facility] for help with >15years LBP. Pt has fluctuating symptoms into legs, history of back injections.    Currently in Pain? No/denies             Intervention: -AA/ROM on nustep, seat 3, arms 9, level 3 x5 minutes -silver ball rollout 10x5secH, 2x60sec -STS from low plinth (high surface for patient) 1x10 -seated reach between feet  and return to upright 1x10 (19" seat, 7" box) -Standing hip/knee flexion 1x10 bilat @ 2lb   -STS from low plinth (high surface for patient) 1x10 holding silverball  -seated reach between feet and return to upright 1x10 (19" seat, 7" box) -Standing hip/knee flexion 1x10 bilat @ 2lb   -lateral side stepping 1x80ft c 2lb AW  -retro stepping c 2lb AW 1x75ft -Seated trunk rotation with physioball 1x10 bilat       PT Short Term Goals - 05/19/21 1656       PT SHORT TERM GOAL #1   Title Pt will be independent with HEP in order to improve strength and decrease back pain in order to improve pain-free function at home and work.    Baseline 03/29/21 HEP given    Time 4    Period Weeks    Status Achieved               PT Long Term Goals - 05/30/21 1417       PT LONG TERM GOAL #1   Title Pt will decrease worst back pain as reported on NPRS by at least 2 points in order to demonstrate clinically significant reduction in back pain.    Baseline 04/08/21 10/10 LBP with activity. 05/30/21 10/10  Time 8    Period Weeks    Status On-going    Target Date 07/25/21      PT LONG TERM GOAL #2   Title Pt will decrease 5TSTS to at least 12 seconds in order to demonstrate clinically significant improvement in LE strength for age-matched strength norms    Baseline 03/29/21 18sec; 05/19/21 10.94 sec    Time 8    Period Weeks    Status Achieved      PT LONG TERM GOAL #3   Title Pt will increase to at least 1.0 m/s in order to demonstrate community ambulation speed with less likelihood of an adverse event    Baseline 03/29/21 self selected:0.71m/s fastest: 0.78m/s. 05/19/21 fastest: 1.1 m/s    Time 8    Period Weeks    Status Achieved      PT LONG TERM GOAL #4   Title Patient will increase FOTO score to 49 to demonstrate predicted increase in functional mobility to complete ADLs    Baseline 03/29/21 40; 3/2: 58    Time 8    Period Weeks    Status Achieved      PT LONG TERM GOAL #5    Title Pt will improve Oswestry score 7 points or greater to demonstrate subjective improvement in QOL related to back pain.    Baseline 05/30/21: 22% (score: 11)    Time 8    Period Weeks    Status New    Target Date 07/25/21      Additional Long Term Goals   Additional Long Term Goals Yes      PT LONG TERM GOAL #6   Title Pt will report being able to stand for 2 hours or greater in order to perform household work such as cooking dinner and cleaning dishes afterwards with no more than 8/10 pain (decrease in NPS by 2 points per MCID).    Baseline 05/30/21: able to stand for 30-45 minutes with 10/10 pain    Time 8    Period Weeks    Status New    Target Date 07/25/21                   Plan - 06/08/21 1317     Clinical Impression Statement Continued with current plan of care as laid out in evaluation and recent prior sessions. All interventions tolerated as expected by author. Mobility and strength continue to progress as anticipated. Recovery intervals given as needed based on signs of exertion and/or pt request. Pt educated on best technique for each intervention- author uses verbal, visual, tactile cues to optimize learning. Author takes steps to maximize patient independence when appropriate. Pt remains highly motivated. Pt closely monitored throughout session for safe activity response, as well as to maximize patient safety during interventions. The patient's therapy prognosis indicates continued potential for improvement, anticipate that future progress is attainable in a reasonable/predictable timeframe. Maximum improvement is within reach. Pt will continue to benefit from skilled PT services to address deficits and impairment identified in evaluation in order to maximize independence and safety in basic mobility required for performance of ADL, IADL, and leisure.    Personal Factors and Comorbidities Comorbidity 3+;Past/Current Experience;Time since onset of  injury/illness/exacerbation;Fitness    Comorbidities COPD, CHF, HTN, CKD    Examination-Activity Limitations Bed Mobility;Carry;Squat;Lift;Reach Overhead;Stand;Stairs;Transfers    Examination-Participation Restrictions Community Activity;Meal Prep;Laundry;Cleaning;Yard Work    Stability/Clinical Decision Making Evolving/Moderate complexity    Clinical Decision Making Moderate    Rehab Potential  Good    PT Frequency 2x / week    PT Duration 8 weeks    PT Treatment/Interventions Electrical Stimulation;Moist Heat;Traction;Ultrasound;Gait training;Therapeutic activities;Neuromuscular re-education;Manual techniques;Spinal Manipulations;ADLs/Self Care Home Management;Passive range of motion;Therapeutic exercise;Functional mobility training;Stair training;Cryotherapy;Iontophoresis 4mg /ml Dexamethasone;Aquatic Therapy;DME Instruction;Balance training;Patient/family education;Dry needling;Joint Manipulations;Splinting;Taping    PT Next Visit Plan no updates, conitnue with POC    PT Home Exercise Plan STS, supine piriformis, side steps with YTB, and SKTC stretch    Consulted and Agree with Plan of Care Patient             Patient will benefit from skilled therapeutic intervention in order to improve the following deficits and impairments:  Decreased balance, Abnormal gait, Decreased activity tolerance, Decreased endurance, Decreased range of motion, Decreased strength, Increased fascial restricitons, Improper body mechanics, Pain, Postural dysfunction, Decreased coordination, Decreased mobility, Difficulty walking, Hypomobility, Increased muscle spasms, Impaired flexibility, Impaired tone  Visit Diagnosis: Chronic right-sided low back pain, unspecified whether sciatica present     Problem List Patient Active Problem List   Diagnosis Date Noted   AKI (acute kidney injury) (HCC) 07/19/2020   UTI (urinary tract infection) 07/18/2020   H/O urinary retention 07/18/2020   Hyponatremia 07/18/2020    Acute on chronic combined systolic and diastolic CHF (congestive heart failure) (HCC) 07/12/2020   Hypertensive emergency 07/12/2020   Elevated troponin 07/12/2020   Hypothyroidism 07/12/2020   Neuralgia and neuritis 03/14/2020   Primary generalized (osteo)arthritis 11/30/2019   Encounter for screening mammogram for malignant neoplasm of breast 11/30/2019   Vasomotor rhinitis 06/28/2019   Atopic dermatitis 06/28/2019   Localized superficial swelling, mass, or lump 06/28/2019   Plantar neuroma of left foot 06/28/2019   Chronic obstructive pulmonary disease (HCC) 11/03/2018   Right flank pain 11/03/2018   Dysuria 11/03/2018   Acute upper respiratory infection 05/06/2018   Acquired hypothyroidism 03/18/2018   CKD (chronic kidney disease), stage IIIa 03/18/2018   ARF (acute renal failure) (HCC) 01/14/2018   Hypertensive crisis 01/14/2018   Acute respiratory failure with hypoxia (HCC) 01/13/2018   Encounter for general adult medical examination with abnormal findings 10/26/2017   Allergic rhinitis 10/26/2017   Vitamin D deficiency 10/26/2017   Gastroesophageal reflux disease without esophagitis 10/26/2017   Essential hypertension 04/16/2017   Thyroid disease 04/16/2017   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 09/15/2016   Dilated cardiomyopathy (HCC) 06/27/2016   Bilateral carotid artery stenosis 05/30/2016   SOBOE (shortness of breath on exertion) 05/30/2016   Hyperparathyroidism, primary (HCC) 08/25/2015   Hypercalcemia 08/12/2014   LVH (left ventricular hypertrophy) due to hypertensive disease, without heart failure 07/09/2014   Moderate mitral insufficiency 07/09/2014   Benign essential hypertension 07/06/2014   Osteoporosis, 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, [place], [address] Phone: [phone]   Fax:  [phone]  Name: Charlet Thornes MRN: [id number] Date of Birth: 1945/11/13

## 2021-06-14 ENCOUNTER — Encounter: Payer: Medicare Other | Admitting: Physical Therapy

## 2021-06-15 ENCOUNTER — Ambulatory Visit: Payer: Medicare Other

## 2021-06-15 DIAGNOSIS — R2689 Other abnormalities of gait and mobility: Secondary | ICD-10-CM | POA: Diagnosis not present

## 2021-06-15 DIAGNOSIS — M545 Low back pain, unspecified: Secondary | ICD-10-CM | POA: Diagnosis not present

## 2021-06-15 DIAGNOSIS — G8929 Other chronic pain: Secondary | ICD-10-CM

## 2021-06-15 NOTE — Therapy (Signed)
[place] [facility] [facility] MEDIC[facility] ?[address] ?[place], [place], [address] ?Phone: [phone]   Fax:  [phone] ? ?Physical Therapy Treatment ? ?Patient Details  ?Name: Stacey [name] ?MRN: [id number] ?Date of Birth: [birth year] ?Referring Provider (PT): Abernathy NP ? ? ?Encounter Date: 06/15/2021 ? ? PT End of Session - 06/15/21 1628   ? ? Visit Number 17   ? Number of Visits 29   ? Date for PT Re-Evaluation 07/25/21   ? Authorization Type Medicare primary; [employer]et[employer] Secondary   ? Authorization Time Period 05/30/21-07/25/21   ? Progress Note Due on Visit 20   ? PT Start Time 1602   ? PT Stop Time 1642   ? PT Time Calculation (min) 40 min   ? Activity Tolerance Patient tolerated treatment well;No increased pain;Patient limited by fatigue   ? Behavior During Therapy [facility] for tasks assessed/performed   ? ?  ?  ? ?  ? ? ?Past Medical History:  ?Diagnosis Date  ? CHF (congestive heart failure) ([place])   ? Chronic kidney disease   ? COPD (chronic obstructive pulmonary disease) ([place])   ? Hypertension   ? Sarcoidosis   ? Thyroid disease   ? ? ?Past Surgical History:  ?Procedure Laterality Date  ? CATARACT EXTRACTION    ? EYE SURGERY Right 07/09/2019  ? GALLBLADDER SURGERY    ? KNEE SURGERY    ? thyroidectomy    ? ? ?There were no vitals filed for this visit. ? ? Subjective Assessment - 06/15/21 1625   ? ? Subjective Pt doing well today. She had 2 days of pain with activity at home after lots of actiivty in kitchen, etc. Pt feels farly good today overall.   ? Pertinent History Stacey [name] comes to [facility] for help with >15years LBP. Pt has fluctuating symptoms into legs, history of back injections.   ? Patient Stated Goals Would like to be able to complete walking regimen and cooking/cleaning without pain   ? Currently in Pain? Yes   ? Pain Score 4    ? Pain Location Back   ? ?  ?  ? ?  ? ? ? ?INTERVENTION ? ?-AA/ROM on Nustep, seat 3, arms 9, level 3 x5 minutes ?-seated  lumbar flexion stretch 3x30sec (arms leaning on ketchup physio ball ?-seated marching 2x10 bilat  ?-seated RDL 2x15 ?-STS from low surface 1x5 (green chair, feet on step)  ?-STS from low surface 1x5 (green chair, feet on step)  ?-seated marching 1x10 bilat  ?-seated rotation stretch 1x30sec bilat  ?-lateral stepups 1x10 bilat (BUE support)  ?-fwd stepup/down 1x8 bilat (limited by chronic knee OA issues ?-seated rotation stretch 1x30sec bilat  ? ? PT Education - 06/15/21 1629   ? ? Education Details [facility]   ? Person(s) Educated Patient   ? Methods Explanation   ? Comprehension Verbalized understanding   ? ?  ?  ? ?  ? ? ? PT Short Term Goals - 05/19/21 1656   ? ?  ? PT SHORT TERM GOAL #1  ? Title Pt will be independent with HEP in order to improve strength and decrease back pain in order to improve pain-free function at home and work.   ? Baseline 03/29/21 HEP given   ? Time 4   ? Period Weeks   ? Status Achieved   ? ?  ?  ? ?  ? ? ? ? PT Long  Term Goals - 05/30/21 1417   ? ?  ? PT LONG TERM GOAL #1  ? Title Pt will decrease worst back pain as reported on NPRS by at least 2 points in order to demonstrate clinically significant reduction in back pain.   ? Baseline 04/08/21 10/10 LBP with activity. 05/30/21 10/10   ? Time 8   ? Period Weeks   ? Status On-going   ? Target Date 07/25/21   ?  ? PT LONG TERM GOAL #2  ? Title Pt will decrease 5TSTS to at least 12 seconds in order to demonstrate clinically significant improvement in LE strength for age-matched strength norms   ? Baseline 03/29/21 18sec; 05/19/21 10.94 sec   ? Time 8   ? Period Weeks   ? Status Achieved   ?  ? PT LONG TERM GOAL #3  ? Title Pt will increase 10MWT to at least 1.0 m/s in order to demonstrate community ambulation speed with less likelihood of an adverse event   ? Baseline 03/29/21 self selected:0.40ms fastest: 0.715m. 05/19/21 fastest: 1.1 m/s   ? Time 8   ? Period Weeks   ? Status Achieved   ?  ? PT LONG TERM GOAL #4  ? Title  Patient will increase FOTO score to 49 to demonstrate predicted increase in functional mobility to complete ADLs   ? Baseline 03/29/21 40; 3/2: 58   ? Time 8   ? Period Weeks   ? Status Achieved   ?  ? PT LONG TERM GOAL #5  ? Title Pt will improve Oswestry score 7 points or greater to demonstrate subjective improvement in QOL related to back pain.   ? Baseline 05/30/21: 22% (score: 11)   ? Time 8   ? Period Weeks   ? Status New   ? Target Date 07/25/21   ?  ? Additional Long Term Goals  ? Additional Long Term Goals Yes   ?  ? PT LONG TERM GOAL #6  ? Title Pt will report being able to stand for 2 hours or greater in order to perform household work such as cooking dinner and cleaning dishes afterwards with no more than 8/10 pain (decrease in NPS by 2 points per MCID).   ? Baseline 05/30/21: able to stand for 30-45 minutes with 10/10 pain   ? Time 8   ? Period Weeks   ? Status New   ? Target Date 07/25/21   ? ?  ?  ? ?  ? ? ? ? ? ? ? ? Plan - 06/15/21 1629   ? ? Clinical Impression Statement Continued with gentle stretcing and mobility in the back as well as strengthing of core and hips. Pt limited by knee pain at times, a chronic issue. Pt does well with recovery between sets. Pt needs tactile cues for activities that require lumbar flexion. No aggravation of pain or symptoms within session.   ? Personal Factors and Comorbidities Comorbidity 3+;Past/Current Experience;Time since onset of injury/illness/exacerbation;Fitness   ? Comorbidities COPD, CHF, HTN, CKD   ? Examination-Activity Limitations Bed Mobility;Carry;Squat;Lift;Reach Overhead;Stand;Stairs;Transfers   ? Examination-Participation Restrictions Community Activity;Meal Prep;Laundry;Cleaning;[place]   ? Stability/Clinical Decision Making Evolving/Moderate complexity   ? Clinical Decision Making Moderate   ? Rehab Potential Good   ? PT Frequency 2x / week   ? PT Duration 8 weeks   ? PT Treatment/Interventions ElAgricultural engineerherapeutic activities;Neuromuscular re-education;Manual techniques;Spinal Manipulations;ADLs/Self Care Home Management;Passive range of motion;Therapeutic exercise;Functional mobility training;Stair training;Cryotherapy;Iontophoresis '4mg'$ /ml  Dexamethasone;Aquatic Therapy;DME Instruction;Balance training;Patient/family education;Dry needling;Joint Manipulations;Splinting;Taping   ? PT Next Visit Plan no updates, conitnue with POC   ? PT Home Exercise Plan STS, supine piriformis, side steps with YTB, and SKTC stretch   ? Consulted and Agree with Plan of Care Patient   ? ?  ?  ? ?  ? ? ?Patient will benefit from skilled therapeutic intervention in order to improve the following deficits and impairments:  Decreased balance, Abnormal gait, Decreased activity tolerance, Decreased endurance, Decreased range of motion, Decreased strength, Increased fascial restricitons, Improper body mechanics, Pain, Postural dysfunction, Decreased coordination, Decreased mobility, Difficulty walking, Hypomobility, Increased muscle spasms, Impaired flexibility, Impaired tone ? ?Visit Diagnosis: ?Chronic right-sided low back pain, unspecified whether sciatica present ? ? ? ? ?Problem List ?Patient Active Problem List  ? Diagnosis Date Noted  ? AKI (acute kidney injury) ([place]) 07/19/2020  ? UTI (urinary tract infection) 07/18/2020  ? H/O urinary retention 07/18/2020  ? Hyponatremia 07/18/2020  ? Acute on chronic combined systolic and diastolic CHF (congestive heart failure) ([place]) 07/12/2020  ? Hypertensive emergency 07/12/2020  ? Elevated troponin 07/12/2020  ? Hypothyroidism 07/12/2020  ? Neuralgia and neuritis 03/14/2020  ? Primary generalized (osteo)arthritis 11/30/2019  ? Encounter for screening mammogram for malignant neoplasm of breast 11/30/2019  ? Vasomotor rhinitis 06/28/2019  ? Atopic dermatitis 06/28/2019  ? Localized superficial swelling, mass, or lump 06/28/2019  ? Plantar neuroma of left  foot 06/28/2019  ? Chronic obstructive pulmonary disease ([place]) 11/03/2018  ? Right flank pain 11/03/2018  ? Dysuria 11/03/2018  ? Acute upper respiratory infection 05/06/2018  ? Acquired hypothyroidism 03/18/2018  ? CKD (chronic ki

## 2021-06-16 DIAGNOSIS — I340 Nonrheumatic mitral (valve) insufficiency: Secondary | ICD-10-CM | POA: Diagnosis not present

## 2021-06-16 DIAGNOSIS — I6523 Occlusion and stenosis of bilateral carotid arteries: Secondary | ICD-10-CM | POA: Diagnosis not present

## 2021-06-16 DIAGNOSIS — I119 Hypertensive heart disease without heart failure: Secondary | ICD-10-CM | POA: Diagnosis not present

## 2021-06-16 DIAGNOSIS — D8685 Sarcoid myocarditis: Secondary | ICD-10-CM | POA: Diagnosis not present

## 2021-06-16 DIAGNOSIS — I10 Essential (primary) hypertension: Secondary | ICD-10-CM | POA: Diagnosis not present

## 2021-06-16 DIAGNOSIS — I5021 Acute systolic (congestive) heart failure: Secondary | ICD-10-CM | POA: Diagnosis not present

## 2021-06-16 DIAGNOSIS — E782 Mixed hyperlipidemia: Secondary | ICD-10-CM | POA: Diagnosis not present

## 2021-06-21 ENCOUNTER — Encounter: Payer: Medicare Other | Admitting: Physical Therapy

## 2021-06-21 ENCOUNTER — Other Ambulatory Visit: Payer: Self-pay | Admitting: Nurse Practitioner

## 2021-06-21 DIAGNOSIS — M722 Plantar fascial fibromatosis: Secondary | ICD-10-CM

## 2021-06-22 ENCOUNTER — Ambulatory Visit: Payer: Medicare Other | Attending: Nurse Practitioner

## 2021-06-22 DIAGNOSIS — M5441 Lumbago with sciatica, right side: Secondary | ICD-10-CM | POA: Diagnosis not present

## 2021-06-22 DIAGNOSIS — M545 Low back pain, unspecified: Secondary | ICD-10-CM

## 2021-06-22 DIAGNOSIS — G8929 Other chronic pain: Secondary | ICD-10-CM | POA: Insufficient documentation

## 2021-06-22 NOTE — Therapy (Signed)
[facility] ?[address] ?[place], [place], [address] ?Phone: [phone]   Fax:  [phone] ? ?Physical Therapy Treatment ? ?Patient Details  ?Name: Stacey [name] ?MRN: [id number] ?Date of Birth: August 27, 1945 ?Referring Provider (PT): Abernathy NP ? ? ?Encounter Date: 06/22/2021 ? ? PT End of Session - 06/22/21 1442   ? ? Visit Number 18   ? Number of Visits 29   ? Date for PT Re-Evaluation 07/25/21   ? Authorization Type Medicare primary; [employer] Secondary   ? Authorization Time Period 05/30/21-07/25/21   ? Progress Note Due on Visit 20   ? PT Start Time 1435   ? PT Stop Time 1513   ? PT Time Calculation (min) 38 min   ? Activity Tolerance Patient tolerated treatment well;No increased pain;Patient limited by fatigue   ? ?  ?  ? ?  ? ? ?Past Medical History:  ?Diagnosis Date  ? CHF (congestive heart failure) ([place])   ? Chronic kidney disease   ? COPD (chronic obstructive pulmonary disease) ([place])   ? Hypertension   ? Sarcoidosis   ? Thyroid disease   ? ? ?Past Surgical History:  ?Procedure Laterality Date  ? CATARACT EXTRACTION    ? EYE SURGERY Right 07/09/2019  ? GALLBLADDER SURGERY    ? KNEE SURGERY    ? thyroidectomy    ? ? ?There were no vitals filed for this visit. ? ? Subjective Assessment - 06/22/21 1440   ? ? Subjective Pt has been doing well, but she is sore from volunterring packing lunches in the kitchen at church today.   ? Pertinent History Stacey [name] comes to [facility] OPP[facility] for help with >15years LBP. Pt has fluctuating symptoms into legs, history of back injections.   ? Currently in Pain? No/denies   ? ?  ?  ? ?  ? ?FOTO: 67; 58 at eval, 40 at reassesment ? ?INTERVENTION THIS DATE:  ?-AA/ROM on Nustep, seat 3, arms 9, level 3 x5 minutes ?-silver ball rollout 2x60sec ?-seated deadlift with yellow medicine ball 1x10 ?-seated wide stance marching 1x15 bilat  ?-seated deadlift with yellow medicine ball 1x10 ?-seated wide stance marching 1x15  bilat  ? ?-STS X10 having intense acute pain in Left thigh (started with standing at church today) ?-BUE support on chair bilat heel raise 1x20 ? ?-bilat cable knee flexion 10lb 1x15 ?-single, then blat cable extension attempted at 5lb, simply too heavy ? ?-hooklying SAQ 1x10 @ 4lb, 1x10 @ 5lb  ?*after further discussion, sounds as though pt may have acute left joint irritation given distribution of inguinal crease pain and standing twisting today (no history of hip issues or known OA on left per pt)  ? ? ?INTERVENTION 06/15/21 ?-AA/ROM on Nustep, seat 3, arms 9, level 3 x5 minutes ?-seated lumbar flexion stretch 3x30sec (arms leaning on ketchup physio ball ?-seated marching 2x10 bilat  ?-seated RDL 2x15 ?-STS from low surface 1x5 (green chair, feet on step)  ?-STS from low surface 1x5 (green chair, feet on step)  ?-seated marching 1x10 bilat  ?-seated rotation stretch 1x30sec bilat  ?-lateral stepups 1x10 bilat (BUE support)  ?-fwd stepup/down 1x8 bilat (limited by chronic knee OA issues ?-seated rotation stretch 1x30sec bilat  ? ?Intervention 06/08/21 ?-AA/ROM on nustep, seat 3, arms 9, level 3 x5 minutes ?-silver ball rollout 10x5secH, 2x60sec ?-STS from low plinth (high surface for patient) 1x10 ?-seated reach between feet and return to upright 1x10 (19" seat, 7"  box) ?-Standing hip/knee flexion 1x10 bilat @ 2lb  ?  ?-STS from low plinth (high surface for patient) 1x10 holding silverball  ?-seated reach between feet and return to upright 1x10 (19" seat, 7" box) ?-Standing hip/knee flexion 1x10 bilat @ 2lb  ?  ?-lateral side stepping 1x63f c 2lb AW  ?-retro stepping c 2lb AW 1x112f?-Seated trunk rotation with physioball 1x10 bilat  ? ? ? ? ? PT Education - 06/22/21 1451   ? ? Education Details discussed working on next few weeks for DC plan   ? Methods Demonstration   ? Comprehension Verbalized understanding   ? ?  ?  ? ?  ? ? ? PT Short Term Goals - 05/19/21 1656   ? ?  ? PT SHORT TERM GOAL #1  ? Title Pt will  be independent with HEP in order to improve strength and decrease back pain in order to improve pain-free function at home and work.   ? Baseline 03/29/21 HEP given   ? Time 4   ? Period Weeks   ? Status Achieved   ? ?  ?  ? ?  ? ? ? ? PT Long Term Goals - 05/30/21 1417   ? ?  ? PT LONG TERM GOAL #1  ? Title Pt will decrease worst back pain as reported on NPRS by at least 2 points in order to demonstrate clinically significant reduction in back pain.   ? Baseline 04/08/21 10/10 LBP with activity. 05/30/21 10/10   ? Time 8   ? Period Weeks   ? Status On-going   ? Target Date 07/25/21   ?  ? PT LONG TERM GOAL #2  ? Title Pt will decrease 5TSTS to at least 12 seconds in order to demonstrate clinically significant improvement in LE strength for age-matched strength norms   ? Baseline 03/29/21 18sec; 05/19/21 10.94 sec   ? Time 8   ? Period Weeks   ? Status Achieved   ?  ? PT LONG TERM GOAL #3  ? Title Pt will increase 10MWT to at least 1.0 m/s in order to demonstrate community ambulation speed with less likelihood of an adverse event   ? Baseline 03/29/21 self selected:0.6773mfastest: 0.65m84m3/2/23 fastest: 1.1 m/s   ? Time 8   ? Period Weeks   ? Status Achieved   ?  ? PT LONG TERM GOAL #4  ? Title Patient will increase FOTO score to 49 to demonstrate predicted increase in functional mobility to complete ADLs   ? Baseline 03/29/21 40; 3/2: 58   ? Time 8   ? Period Weeks   ? Status Achieved   ?  ? PT LONG TERM GOAL #5  ? Title Pt will improve Oswestry score 7 points or greater to demonstrate subjective improvement in QOL related to back pain.   ? Baseline 05/30/21: 22% (score: 11)   ? Time 8   ? Period Weeks   ? Status New   ? Target Date 07/25/21   ?  ? Additional Long Term Goals  ? Additional Long Term Goals Yes   ?  ? PT LONG TERM GOAL #6  ? Title Pt will report being able to stand for 2 hours or greater in order to perform household work such as cooking dinner and cleaning dishes afterwards with no more than 8/10 pain  (decrease in NPS by 2 points per MCID).   ? Baseline 05/30/21: able to stand for 30-45 minutes with 10/10 pain   ?  Time 8   ? Period Weeks   ? Status New   ? Target Date 07/25/21   ? ?  ?  ? ?  ? ? ? ? ? ? ? ? Plan - 06/22/21 1451   ? ? Clinical Impression Statement Continued with stretching and ROM in lumbar spine area, generalized strengthening of legs with more focal attention to quads which have recently revealed big deficits. Session tolerated well in general, some moderate limitations from acutely exacerbated left hip joint.   ? Personal Factors and Comorbidities Comorbidity 3+;Past/Current Experience;Time since onset of injury/illness/exacerbation;Fitness   ? Comorbidities COPD, CHF, HTN, CKD   ? Examination-Activity Limitations Bed Mobility;Carry;Squat;Lift;Reach Overhead;Stand;Stairs;Transfers   ? Examination-Participation Restrictions Community Activity;Meal Prep;Laundry;Cleaning;Valla Leaver Work   ? Stability/Clinical Decision Making Evolving/Moderate complexity   ? Clinical Decision Making Moderate   ? Rehab Potential Good   ? PT Frequency 2x / week   ? PT Duration 8 weeks   ? PT Treatment/Interventions Fish farm manager;Therapeutic activities;Neuromuscular re-education;Manual techniques;Spinal Manipulations;ADLs/Self Care Home Management;Passive range of motion;Therapeutic exercise;Functional mobility training;Stair training;Cryotherapy;Iontophoresis '4mg'$ /ml Dexamethasone;Aquatic Therapy;DME Instruction;Balance training;Patient/family education;Dry needling;Joint Manipulations;Splinting;Taping   ? PT Next Visit Plan no updates, conitnue with POC   ? PT Home Exercise Plan STS, supine piriformis, side steps with YTB, and SKTC stretch   ? Consulted and Agree with Plan of Care Patient   ? ?  ?  ? ?  ? ? ?Patient will benefit from skilled therapeutic intervention in order to improve the following deficits and impairments:  Decreased balance, Abnormal gait, Decreased  activity tolerance, Decreased endurance, Decreased range of motion, Decreased strength, Increased fascial restricitons, Improper body mechanics, Pain, Postural dysfunction, Decreased coordination, Decreased

## 2021-06-23 ENCOUNTER — Telehealth: Payer: Self-pay

## 2021-06-27 ENCOUNTER — Ambulatory Visit: Payer: Medicare Other

## 2021-06-27 DIAGNOSIS — G8929 Other chronic pain: Secondary | ICD-10-CM | POA: Diagnosis not present

## 2021-06-27 DIAGNOSIS — M5441 Lumbago with sciatica, right side: Secondary | ICD-10-CM | POA: Diagnosis not present

## 2021-06-27 NOTE — Therapy (Signed)
[facility] ?[address] ?[place], [place], [address] ?Phone: [phone]   Fax:  [phone] ? ?Physical Therapy Treatment ? ?Patient Details  ?Name: Stacey [name] ?MRN: [id number] ?Date of Birth: [birth year] ?Referring Provider (PT): Abernathy NP ? ? ?Encounter Date: 06/27/2021 ? ? PT End of Session - 06/27/21 1445   ? ? Visit Number 19   ? Number of Visits 29   ? Date for PT Re-Evaluation 07/25/21   ? Authorization Type Medicare primary; Aetna Secondary   ? Authorization Time Period 05/30/21-07/25/21   ? Progress Note Due on Visit 20   ? PT Start Time 1438   ? PT Stop Time 1510   ? PT Time Calculation (min) 32 min   ? Activity Tolerance Patient tolerated treatment well;No increased pain;Patient limited by fatigue   ? Behavior During Therapy [facility] for tasks assessed/performed   ? ?  ?  ? ?  ? ? ?Past Medical History:  ?Diagnosis Date  ? CHF (congestive heart failure) ([place])   ? Chronic kidney disease   ? COPD (chronic obstructive pulmonary disease) ([place])   ? Hypertension   ? Sarcoidosis   ? Thyroid disease   ? ? ?Past Surgical History:  ?Procedure Laterality Date  ? CATARACT EXTRACTION    ? EYE SURGERY Right 07/09/2019  ? GALLBLADDER SURGERY    ? KNEE SURGERY    ? thyroidectomy    ? ? ?There were no vitals filed for this visit. ? ? Subjective Assessment - 06/27/21 1443   ? ? Subjective Pt doing well today. Her left knee pain persisted into Thursday, but felt better after using a toxin removing patch.   ? Pertinent History Rema Lievanos comes to [facility] for help with >15years LBP. Pt has fluctuating symptoms into legs, history of back injections.   ? Currently in Pain? No/denies   ? ?  ?  ? ?  ? ?Intervention this date: ?-AA/ROM on nustep: seat 3, arms 9:2 minutes level 2, 2 minutes level 3 ? ?-chair rotation stretch 2x30sec bilat  ?-STS from green chair hands free 1x10  ? ?-LAQ 4lb AW 1x12 bilat  ?-hamstrings flexion 1x12 @ redTB  ?-seated BLE  hip IR c redTB (ball at knees) 1x12  ?-marching 1x10 bila tc 4lb AW  ? ?-LAQ 4lb AW 1x12 bilat  ?-hamstrings flexion 1x12 @ redTB  ?-seated BLE hip IR c redTB (ball at knees) 1x12  ?-marching 1x10 bila tc 4lb AW  ? ?FOTO: 67; 58 at eval, 40 at Reassessment ?  ?INTERVENTION 06/22/21:  ?-AA/ROM on Nustep, seat 3, arms 9, level 3 x5 minutes ?-silver ball rollout 2x60sec ?-seated deadlift with yellow medicine ball 1x10 ?-seated wide stance marching 1x15 bilat  ?-seated deadlift with yellow medicine ball 1x10 ?-seated wide stance marching 1x15 bilat  ?  ?-STS X10 having intense acute pain in Left thigh (started with standing at church today) ?-BUE support on chair bilat heel raise 1x20 ?  ?-bilat cable knee flexion 10lb 1x15 ?-single, then blat cable extension attempted at 5lb, simply too heavy ?  ?-hooklying SAQ 1x10 @ 4lb, 1x10 @ 5lb  ?*after further discussion, sounds as though pt may have acute left joint irritation given distribution of inguinal crease pain and standing twisting today (no history of hip issues or known OA on left per pt)  ?  ?  ?INTERVENTION 06/15/21 ?-AA/ROM on Nustep, seat 3, arms 9, level 3 x5 minutes ?-seated lumbar flexion stretch  3x30sec (arms leaning on ketchup physio ball ?-seated marching 2x10 bilat  ?-seated RDL 2x15 ?-STS from low surface 1x5 (green chair, feet on step)  ?-STS from low surface 1x5 (green chair, feet on step)  ?-seated marching 1x10 bilat  ?-seated rotation stretch 1x30sec bilat  ?-lateral stepups 1x10 bilat (BUE support)  ?-fwd stepup/down 1x8 bilat (limited by chronic knee OA issues ?-seated rotation stretch 1x30sec bilat  ?  ?Intervention 06/08/21 ?-AA/ROM on nustep, seat 3, arms 9, level 3 x5 minutes ?-silver ball rollout 10x5secH, 2x60sec ?-STS from low plinth (high surface for patient) 1x10 ?-seated reach between feet and return to upright 1x10 (19" seat, 7" box) ?-Standing hip/knee flexion 1x10 bilat @ 2lb  ?  ?-STS from low plinth (high surface for patient) 1x10  holding silverball  ?-seated reach between feet and return to upright 1x10 (19" seat, 7" box) ?-Standing hip/knee flexion 1x10 bilat @ 2lb  ?  ?-lateral side stepping 1x62f c 2lb AW  ?-retro stepping c 2lb AW 1x135f?-Seated trunk rotation with physioball 1x10 bilat  ? ? ? ? ? ? PT Education - 06/27/21 1446   ? ? Education Details Reviewed exercise form again   ? Person(s) Educated Patient   ? Methods Explanation   ? Comprehension Verbalized understanding   ? ?  ?  ? ?  ? ? ? PT Short Term Goals - 05/19/21 1656   ? ?  ? PT SHORT TERM GOAL #1  ? Title Pt will be independent with HEP in order to improve strength and decrease back pain in order to improve pain-free function at home and work.   ? Baseline 03/29/21 HEP given   ? Time 4   ? Period Weeks   ? Status Achieved   ? ?  ?  ? ?  ? ? ? ? PT Long Term Goals - 05/30/21 1417   ? ?  ? PT LONG TERM GOAL #1  ? Title Pt will decrease worst back pain as reported on NPRS by at least 2 points in order to demonstrate clinically significant reduction in back pain.   ? Baseline 04/08/21 10/10 LBP with activity. 05/30/21 10/10   ? Time 8   ? Period Weeks   ? Status On-going   ? Target Date 07/25/21   ?  ? PT LONG TERM GOAL #2  ? Title Pt will decrease 5TSTS to at least 12 seconds in order to demonstrate clinically significant improvement in LE strength for age-matched strength norms   ? Baseline 03/29/21 18sec; 05/19/21 10.94 sec   ? Time 8   ? Period Weeks   ? Status Achieved   ?  ? PT LONG TERM GOAL #3  ? Title Pt will increase 10MWT to at least 1.0 m/s in order to demonstrate community ambulation speed with less likelihood of an adverse event   ? Baseline 03/29/21 self selected:0.6773mfastest: 0.41m37m3/2/23 fastest: 1.1 m/s   ? Time 8   ? Period Weeks   ? Status Achieved   ?  ? PT LONG TERM GOAL #4  ? Title Patient will increase FOTO score to 49 to demonstrate predicted increase in functional mobility to complete ADLs   ? Baseline 03/29/21 40; 3/2: 58   ? Time 8   ? Period Weeks    ? Status Achieved   ?  ? PT LONG TERM GOAL #5  ? Title Pt will improve Oswestry score 7 points or greater to demonstrate subjective improvement in QOL related to  back pain.   ? Baseline 05/30/21: 22% (score: 11)   ? Time 8   ? Period Weeks   ? Status New   ? Target Date 07/25/21   ?  ? Additional Long Term Goals  ? Additional Long Term Goals Yes   ?  ? PT LONG TERM GOAL #6  ? Title Pt will report being able to stand for 2 hours or greater in order to perform household work such as cooking dinner and cleaning dishes afterwards with no more than 8/10 pain (decrease in NPS by 2 points per MCID).   ? Baseline 05/30/21: able to stand for 30-45 minutes with 10/10 pain   ? Time 8   ? Period Weeks   ? Status New   ? Target Date 07/25/21   ? ?  ?  ? ?  ? ? ? ? ? ? ? ? Plan - 06/27/21 1446   ? ? Clinical Impression Statement Continued with lumbar mobility, gentle core and BLE strength. Pt progressing in general in strength and activity tolerance. No HEP updates this session. No exacerbation of pain/symptoms in session.   ? Personal Factors and Comorbidities Comorbidity 3+;Past/Current Experience;Time since onset of injury/illness/exacerbation;Fitness   ? Comorbidities COPD, CHF, HTN, CKD   ? Examination-Activity Limitations Bed Mobility;Carry;Squat;Lift;Reach Overhead;Stand;Stairs;Transfers   ? Examination-Participation Restrictions Community Activity;Meal Prep;Laundry;Cleaning;Valla Leaver Work   ? Stability/Clinical Decision Making Evolving/Moderate complexity   ? Clinical Decision Making Moderate   ? Rehab Potential Good   ? PT Frequency 2x / week   ? PT Duration 8 weeks   ? PT Treatment/Interventions Fish farm manager;Therapeutic activities;Neuromuscular re-education;Manual techniques;Spinal Manipulations;ADLs/Self Care Home Management;Passive range of motion;Therapeutic exercise;Functional mobility training;Stair training;Cryotherapy;Iontophoresis '4mg'$ /ml Dexamethasone;Aquatic  Therapy;DME Instruction;Balance training;Patient/family education;Dry needling;Joint Manipulations;Splinting;Taping   ? PT Next Visit Plan no updates, conitnue with POC   ? PT Home Exercise Plan STS, supine pir

## 2021-06-28 ENCOUNTER — Other Ambulatory Visit: Payer: Self-pay | Admitting: Nurse Practitioner

## 2021-06-28 ENCOUNTER — Other Ambulatory Visit: Payer: Self-pay

## 2021-06-28 DIAGNOSIS — Z1211 Encounter for screening for malignant neoplasm of colon: Secondary | ICD-10-CM

## 2021-06-28 DIAGNOSIS — Z86010 Personal history of colonic polyps: Secondary | ICD-10-CM

## 2021-06-28 MED ORDER — NA SULFATE-K SULFATE-MG SULF 17.5-3.13-1.6 GM/177ML PO SOLN: 1.0000 | Freq: Once | ORAL | 0 refills | Status: AC

## 2021-06-28 NOTE — Progress Notes (Signed)
Gastroenterology Pre-Procedure Review ? ?Request Date: 07/18/2021 ?Requesting Physician: Dr. Vicente Males ? ?PATIENT REVIEW QUESTIONS: The patient responded to the following health history questions as indicated:   ? ?1. Are you having any GI issues? no ?2. Do you have a personal history of Polyps? yes (LAST COLONOSCOPY) ?3. Do you have a family history of Colon Cancer or Polyps? no ?4. Diabetes Mellitus? no ?5. Joint replacements in the past 12 months?no ?6. Major health problems in the past 3 months?no ?7. Any artificial heart valves, MVP, or defibrillator?no ?   ?MEDICATIONS & ALLERGIES:    ?Patient reports the following regarding taking any anticoagulation/antiplatelet therapy:   ?Plavix, Coumadin, Eliquis, Xarelto, Lovenox, Pradaxa, Brilinta, or Effient? no ?Aspirin? no ? ?Patient confirms/reports the following medications:  ?Current Outpatient Medications  ?Medication Sig Dispense Refill  ? albuterol (VENTOLIN HFA) 108 (90 Base) MCG/ACT inhaler Inhale into the lungs every 6 (six) hours as needed for wheezing or shortness of breath.    ? carvedilol (COREG) 6.25 MG tablet Take 1 tablet (6.25 mg total) by mouth 2 (two) times daily with a meal. 60 tablet 0  ? cetirizine (ZYRTEC) 10 MG tablet TAKE 1 TABLET(10 MG) BY MOUTH DAILY 90 tablet 4  ? cloNIDine (CATAPRES) 0.1 MG tablet Take 1 tablet (0.1 mg total) by mouth 2 (two) times daily. 180 tablet 4  ? dapagliflozin propanediol (FARXIGA) 10 MG TABS tablet Take 1 tablet (10 mg total) by mouth daily before breakfast. 30 tablet 5  ? fluticasone (FLONASE) 50 MCG/ACT nasal spray Place 2 sprays into both nostrils daily. 16 g 6  ? fluticasone (FLOVENT HFA) 110 MCG/ACT inhaler Inhale 1 puff into the lungs 2 (two) times daily. 1 each 3  ? furosemide (LASIX) 20 MG tablet Take 2 tablets (40 mg total) by mouth daily. 60 tablet 5  ? gabapentin (NEURONTIN) 100 MG capsule Take 2 capsules (200 mg total) by mouth at bedtime. 60 capsule 2  ? hydrALAZINE (APRESOLINE) 50 MG tablet TAKE 2 TABLET  BY MOUTH THREE TIMES DAILY 540 tablet 1  ? latanoprost (XALATAN) 0.005 % ophthalmic solution Place 1 drop into both eyes at bedtime.     ? levothyroxine (SYNTHROID, LEVOTHROID) 88 MCG tablet Take 88 mcg by mouth daily before breakfast.   1  ? MELATONIN CR PO Take by mouth.    ? meloxicam (MOBIC) 15 MG tablet TAKE 1 TABLET(15 MG) BY MOUTH DAILY 90 tablet 1  ? montelukast (SINGULAIR) 10 MG tablet Take 1 tablet (10 mg total) by mouth daily. 90 tablet 3  ? Multiple Vitamins-Minerals (MULTIVITAMIN GUMMIES WOMENS) CHEW Chew 2 tablets by mouth daily.    ? naproxen (NAPROSYN) 500 MG tablet Take 500 mg by mouth daily as needed.    ? ondansetron (ZOFRAN ODT) 4 MG disintegrating tablet Take 1 tablet (4 mg total) by mouth every 8 (eight) hours as needed. 15 tablet 0  ? pantoprazole (PROTONIX) 40 MG tablet TAKE 1 TABLET BY MOUTH DAILY AS NEEDED 90 tablet 2  ? pravastatin (PRAVACHOL) 20 MG tablet TAKE 1 TABLET(20 MG) BY MOUTH AT BEDTIME 90 tablet 1  ? sacubitril-valsartan (ENTRESTO) 24-26 MG Take 1 tablet by mouth 2 (two) times daily. 180 tablet 3  ? timolol (TIMOPTIC) 0.25 % ophthalmic solution Place 1 drop into both eyes 2 (two) times daily.     ? triamcinolone ointment (KENALOG) 0.1 % Apply 1 application topically 2 (two) times daily. 30 g 2  ? ?No current facility-administered medications for this visit.  ? ? ?Patient confirms/reports  the following allergies:  ?Allergies  ?Allergen Reactions  ? Codeine Other (See Comments)  ?  Other reaction(s): GI Intolerance ?Other Reaction: nausea   chest pain ?Nausea ? ?Other reaction(s): GI Intolerance, Other (See Comments) ?Nausea ?GI Intolerance, nausea, chest pain ?  ? Propoxyphene Other (See Comments)  ?  Other Reaction: nausea and chest pain  ? Alendronate   ?  Other reaction(s): Other (See Comments) ?Chest pain  ? Ciprofloxacin Rash  ? Lisinopril   ?  Other reaction(s): Cough  ? Amlodipine Swelling  ? Aspirin   ?  Other reaction(s): Other (See Comments) ?Other reaction(s): Other  (See Comments) ?Stomach hurt  ? Methotrexate Hives  ? Tramadol Nausea And Vomiting  ? Vicodin [Hydrocodone-Acetaminophen] Nausea And Vomiting  ? ? ?No orders of the defined types were placed in this encounter. ? ? ?AUTHORIZATION INFORMATION ?Primary Insurance: ?1D#: ?Group #: ? ?Secondary Insurance: ?1D#: ?Group #: ? ?SCHEDULE INFORMATION: ?Date:07/18/2021  ?Time: ?[place] ? ?

## 2021-06-28 NOTE — Telephone Encounter (Signed)
Spoke to pt, she is aware or referral. Pt is changing PCPs. Pt wants something called in for sinuses, drainage, ear pain left is worse. Has been going on for about a week.  ?

## 2021-06-28 NOTE — Telephone Encounter (Signed)
Spoke to pt, informed her she would need to be seen for her sinus issues and that at the moment we don't have any availability, but if its that bad she can go to an urgent care. She said it wasn't that bad. Pt also stated GI has already reached out to her for her colonoscopy.  ?

## 2021-06-29 ENCOUNTER — Telehealth: Payer: Medicare Other

## 2021-06-30 ENCOUNTER — Ambulatory Visit: Payer: Medicare Other

## 2021-06-30 DIAGNOSIS — G8929 Other chronic pain: Secondary | ICD-10-CM | POA: Diagnosis not present

## 2021-06-30 DIAGNOSIS — M5441 Lumbago with sciatica, right side: Secondary | ICD-10-CM | POA: Diagnosis not present

## 2021-06-30 DIAGNOSIS — M545 Low back pain, unspecified: Secondary | ICD-10-CM

## 2021-06-30 NOTE — Therapy (Signed)
[place] [facility] ?[address] ?[place], [place], [address] ?Phone: [phone]   Fax:  [phone] ? ?Physical Therapy Treatment/Reassessment  ?Physical Therapy Progress Note ? ? ?Dates of reporting period  05/19/21   to   06/30/21 ? ? ?Patient Details  ?Name: Stacey [name] ?MRN: [id number] ?Date of Birth: Aug 04, 1945 ?Referring Provider (PT): Abernathy NP ? ? ?Encounter Date: 06/30/2021 ? ? PT End of Session - 06/30/21 1438   ? ? Visit Number 20   ? Number of Visits 29   ? Date for PT Re-Evaluation 07/25/21   ? Authorization Type Medicare primary; Aetna Secondary   ? Authorization Time Period 05/30/21-07/25/21   ? Progress Note Due on Visit 30   ? PT Start Time 1430   ? PT Stop Time 1500   ? PT Time Calculation (min) 30 min   ? Activity Tolerance Patient tolerated treatment well;No increased pain;Patient limited by fatigue   ? Behavior During Therapy [facility] for tasks assessed/performed   ? ?  ?  ? ?  ? ? ?Past Medical History:  ?Diagnosis Date  ? CHF (congestive heart failure) ([place])   ? Chronic kidney disease   ? COPD (chronic obstructive pulmonary disease) ([place])   ? Hypertension   ? Sarcoidosis   ? Thyroid disease   ? ? ?Past Surgical History:  ?Procedure Laterality Date  ? CATARACT EXTRACTION    ? EYE SURGERY Right 07/09/2019  ? GALLBLADDER SURGERY    ? KNEE SURGERY    ? thyroidectomy    ? ? ?There were no vitals filed for this visit. ? ? Subjective Assessment - 06/30/21 1437   ? ? Subjective Pt doing ok today. Quite fatigued after extensive work at [employer]. She was sore in her back last time she believes due to rotation stretch, however she has never had increased back symptoms after doing this in prior sessions.   ? Pertinent History Leroy Trim comes to [facility] for help with >15years LBP. Pt has fluctuating symptoms into legs, history of back injections.   ? Patient Stated Goals Would like to be able to complete walking regimen and cooking/cleaning  without pain   ? Currently in Pain? No/denies   ? ?  ?  ? ?  ? ? ?FOTO: 67; 58 at eval, 40 at reassessment ? ?Intervention this date:: ?-Nustep AA/ROM Seat 3, arms 9, level 2, 5 minutes ?-5xSTS x2 (11.3sec)  ?-10MWT x2 (0.1ms) ? ?-LAQ 1x12 @ 4lb bilat ?-seated marching 1x12 @ 4lb  ?-seated yellow reverse clam 1x15 ?-green band clam 1x15  ? ?-LAQ 1x12 @ 4lb bilat ?-seated marching 1x12 @ 4lb  ?-seated yellow reverse clam 1x15 ?-green band clam 1x15  ? ? ? ?Intervention 06/27/21 ?-AA/ROM on nustep: seat 3, arms 9:2 minutes level 2, 2 minutes level 3 ?  ?-chair rotation stretch 2x30sec bilat  ?-STS from green chair hands free 1x10  ?  ?-LAQ 4lb AW 1x12 bilat  ?-hamstrings flexion 1x12 @ redTB  ?-seated BLE hip IR c redTB (ball at knees) 1x12  ?-marching 1x10 bila tc 4lb AW  ?  ?-LAQ 4lb AW 1x12 bilat  ?-hamstrings flexion 1x12 @ redTB  ?-seated BLE hip IR c redTB (ball at knees) 1x12  ?-marching 1x10 bila tc 4lb AW  ?  ? ?  ?INTERVENTION 06/22/21:  ?-AA/ROM on Nustep, seat 3, arms 9, level 3 x5 minutes ?-silver ball rollout 2x60sec ?-seated deadlift with yellow medicine ball 1x10 ?-seated wide  stance marching 1x15 bilat  ?-seated deadlift with yellow medicine ball 1x10 ?-seated wide stance marching 1x15 bilat  ?  ?-STS X10 having intense acute pain in Left thigh (started with standing at church today) ?-BUE support on chair bilat heel raise 1x20 ?  ?-bilat cable knee flexion 10lb 1x15 ?-single, then blat cable extension attempted at 5lb, simply too heavy ?  ?-hooklying SAQ 1x10 @ 4lb, 1x10 @ 5lb  ?*after further discussion, sounds as though pt may have acute left joint irritation given distribution of inguinal crease pain and standing twisting today (no history of hip issues or known OA on left per pt)  ?  ?  ?INTERVENTION 06/15/21 ?-AA/ROM on Nustep, seat 3, arms 9, level 3 x5 minutes ?-seated lumbar flexion stretch 3x30sec (arms leaning on ketchup physio ball ?-seated marching 2x10 bilat  ?-seated RDL 2x15 ?-STS from  low surface 1x5 (green chair, feet on step)  ?-STS from low surface 1x5 (green chair, feet on step)  ?-seated marching 1x10 bilat  ?-seated rotation stretch 1x30sec bilat  ?-lateral stepups 1x10 bilat (BUE support)  ?-fwd stepup/down 1x8 bilat (limited by chronic knee OA issues ?-seated rotation stretch 1x30sec bilat  ?  ? ? ? PT Education - 06/30/21 1439   ? ? Education Details discussed activity modification at home and church   ? Person(s) Educated Patient   ? Methods Explanation   ? Comprehension Verbalized understanding   ? ?  ?  ? ?  ? ? ? PT Short Term Goals - 05/19/21 1656   ? ?  ? PT SHORT TERM GOAL #1  ? Title Pt will be independent with HEP in order to improve strength and decrease back pain in order to improve pain-free function at home and work.   ? Baseline 03/29/21 HEP given   ? Time 4   ? Period Weeks   ? Status Achieved   ? ?  ?  ? ?  ? ? ? ? PT Long Term Goals - 06/30/21 1440   ? ?  ? PT LONG TERM GOAL #1  ? Title Pt will decrease worst back pain as reported on NPRS by at least 2 points in order to demonstrate clinically significant reduction in back pain.   ? Baseline 04/08/21 10/10 LBP with activity. 05/30/21 10/10; 06/30/21: 4/10   ? Time 8   ? Period Weeks   ? Status Achieved   ? Target Date 07/25/21   ?  ? PT LONG TERM GOAL #2  ? Title Pt will decrease 5TSTS to at least 12 seconds in order to demonstrate clinically significant improvement in LE strength for age-matched strength norms   ? Baseline 03/29/21 18sec; 05/19/21 10.94 sec; 06/30/21: 11.37sec   ? Time 8   ? Period Weeks   ? Status Achieved   ? Target Date 07/25/21   ?  ? PT LONG TERM GOAL #3  ? Title Pt will increase 10MWT to at least 1.0 m/s in order to demonstrate community ambulation speed with less likelihood of an adverse event   ? Baseline 03/29/21 self selected:0.5ms fastest: 0.734m. 05/19/21 fastest: 1.1 m/s; 06/30/21: 0.79 self-selected   ? Time 8   ? Period Weeks   ? Status Achieved   ? Target Date 07/25/21   ?  ? PT LONG TERM GOAL #4   ? Title Patient will increase FOTO score to 49 to demonstrate predicted increase in functional mobility to complete ADLs   ? Baseline 03/29/21 40; 3/2: 58; 67 06/30/21   ?  Time 8   ? Period Weeks   ? Status Achieved   ? Target Date 07/26/21   ?  ? PT LONG TERM GOAL #5  ? Title Pt will improve Oswestry score 7 points or greater to demonstrate subjective improvement in QOL related to back pain.   ? Baseline 05/30/21: 22% (score: 11)   ? Time 8   ? Period Weeks   ? Status On-going   ? Target Date 07/25/21   ?  ? PT LONG TERM GOAL #6  ? Title Pt will report being able to stand for 2 hours or greater in order to perform household work such as cooking dinner and cleaning dishes afterwards with no more than 8/10 pain (decrease in NPS by 2 points per MCID).   ? Baseline 05/30/21: able to stand for 30-45 minutes with 10/10 pain   ? Time 8   ? Period Weeks   ? Status On-going   ? Target Date 07/25/21   ? ?  ?  ? ?  ? ? ? ? ? ? ? ? Plan - 06/30/21 1439   ? ? Clinical Impression Statement Reviewed LT goals of treatment, excellent progress thus far, most of which have been achieved. Pt has already transitioned to 1x/week. She feels like DC after 2 more visits would be good for her. She is pleased with her progress made. Her HEP will need to be updated.  ? Personal Factors and Comorbidities Comorbidity 3+;Past/Current Experience;Time since onset of injury/illness/exacerbation;Fitness   ? Comorbidities COPD, CHF, HTN, CKD   ? Examination-Activity Limitations Bed Mobility;Carry;Squat;Lift;Reach Overhead;Stand;Stairs;Transfers   ? Examination-Participation Restrictions Community Activity;Meal Prep;Laundry;Cleaning;Valla Leaver Work   ? Stability/Clinical Decision Making Evolving/Moderate complexity   ? Clinical Decision Making Moderate   ? Rehab Potential Good   ? PT Frequency 2x / week   ? PT Duration 8 weeks   ? PT Treatment/Interventions Fish farm manager;Therapeutic activities;Neuromuscular  re-education;Manual techniques;Spinal Manipulations;ADLs/Self Care Home Management;Passive range of motion;Therapeutic exercise;Functional mobility training;Stair training;Cryotherapy;Iontophoresis 4

## 2021-07-01 ENCOUNTER — Telehealth: Payer: Medicare Other

## 2021-07-05 ENCOUNTER — Other Ambulatory Visit (HOSPITAL_COMMUNITY): Payer: Self-pay

## 2021-07-06 ENCOUNTER — Encounter (HOSPITAL_COMMUNITY): Payer: Self-pay

## 2021-07-06 ENCOUNTER — Encounter: Payer: Self-pay | Admitting: Physical Therapy

## 2021-07-06 ENCOUNTER — Ambulatory Visit: Payer: Medicare Other | Admitting: Physical Therapy

## 2021-07-06 DIAGNOSIS — G8929 Other chronic pain: Secondary | ICD-10-CM | POA: Diagnosis not present

## 2021-07-06 DIAGNOSIS — H938X3 Other specified disorders of ear, bilateral: Secondary | ICD-10-CM | POA: Diagnosis not present

## 2021-07-06 DIAGNOSIS — M5441 Lumbago with sciatica, right side: Secondary | ICD-10-CM | POA: Diagnosis not present

## 2021-07-06 DIAGNOSIS — H6123 Impacted cerumen, bilateral: Secondary | ICD-10-CM | POA: Diagnosis not present

## 2021-07-06 DIAGNOSIS — J0100 Acute maxillary sinusitis, unspecified: Secondary | ICD-10-CM | POA: Diagnosis not present

## 2021-07-06 DIAGNOSIS — H6503 Acute serous otitis media, bilateral: Secondary | ICD-10-CM | POA: Diagnosis not present

## 2021-07-06 NOTE — Therapy (Signed)
[place] [facility] ?[address] ?[place], [place], [address] ?Phone: [phone]   Fax:  [phone] ? ?Physical Therapy Treatment ? ?Patient Details  ?Name: Stacey [name] ?MRN: [id number] ?Date of Birth: 1945-11-26 ?Referring Provider (PT): Abernathy NP ? ? ?Encounter Date: 07/06/2021 ? ? PT End of Session - 07/06/21 1435   ? ? Visit Number 21   ? Number of Visits 29   ? Date for PT Re-Evaluation 07/25/21   ? Authorization Type Medicare primary; Aetna Secondary   ? Authorization Time Period 05/30/21-07/25/21   ? Progress Note Due on Visit 30   ? Activity Tolerance Patient tolerated treatment well;No increased pain;Patient limited by fatigue   ? Behavior During Therapy [facility] for tasks assessed/performed   ? ?  ?  ? ?  ? ? ?Past Medical History:  ?Diagnosis Date  ? CHF (congestive heart failure) ([place])   ? Chronic kidney disease   ? COPD (chronic obstructive pulmonary disease) ([place])   ? Hypertension   ? Sarcoidosis   ? Thyroid disease   ? ? ?Past Surgical History:  ?Procedure Laterality Date  ? CATARACT EXTRACTION    ? EYE SURGERY Right 07/09/2019  ? GALLBLADDER SURGERY    ? KNEE SURGERY    ? thyroidectomy    ? ? ?There were no vitals filed for this visit. ? ? Subjective Assessment - 07/06/21 1432   ? ? Subjective Patient doing well today. States she would like next week to be the last session. Denies pain. States she would like to start water aerobics and does not have time for both PT and aerobics.   ? Pertinent History Stacey [name] comes to [facility] for help with >15years LBP. Pt has fluctuating symptoms into legs, history of back injections.   ? Patient Stated Goals Would like to be able to complete walking regimen and cooking/cleaning without pain   ? Currently in Pain? No/denies   ? ?  ?  ? ?  ? ? ? ? ?Intervention: ?-Nustep Seat 3, arms 9, level 2, 5 minutes ?  ?-LAQ BLE 4# AW 3x10 ?-seated hip flexion 4#AW 3x10 ?-seated GreenTB clam 3x10 ?-lateral  steps GreenTB at knees, 3x10 ?-standing hamstring curl 3# AW 3x10 ?-standing hip extension 3# AW 3x10 ?-STS no UE support 3x10 ?  ? ?Access Code: [email or web address] ? ? ?Exercises ?- Standing Marching  - 3 x weekly - 3 sets - 10 reps ?- Side Stepping with Resistance at Thighs  - 3 x weekly - 3 sets - 10 reps ?- Standing Alternating Knee Flexion with Ankle Weights  - 3 x weekly - 3 sets - 10 reps ?- Standing Hip Extension with Counter Support  - 3 x weekly - 3 sets - 10 reps  ? ? ? ? ?Clinical Impression: Pt is pleasant and motivated throughout session. POC was continued with edits made to HEP to ensure global LE strengthening. Some exercises changed for improved muscle activation. GreenTB provided; educated on where to find  ankle weights after pt inquired. Education on d/c next session - pt eager and agreeable. Patient will continue to benefit from skilled PT to address weakness and pain deficits of the low back to return to PLOF and allow pt to participate in hobbies for improved QOL. ? ? ? ? ? ? ? ? ? ? PT Short Term Goals - 05/19/21 1656   ? ?  ? PT SHORT TERM GOAL #1  ? Title Pt will be  independent with HEP in order to improve strength and decrease back pain in order to improve pain-free function at home and work.   ? Baseline 03/29/21 HEP given   ? Time 4   ? Period Weeks   ? Status Achieved   ? ?  ?  ? ?  ? ? ? ? PT Long Term Goals - 06/30/21 1440   ? ?  ? PT LONG TERM GOAL #1  ? Title Pt will decrease worst back pain as reported on NPRS by at least 2 points in order to demonstrate clinically significant reduction in back pain.   ? Baseline 04/08/21 10/10 LBP with activity. 05/30/21 10/10; 06/30/21: 4/10   ? Time 8   ? Period Weeks   ? Status Achieved   ? Target Date 07/25/21   ?  ? PT LONG TERM GOAL #2  ? Title Pt will decrease 5TSTS to at least 12 seconds in order to demonstrate clinically significant improvement in LE strength for age-matched strength norms   ? Baseline 03/29/21 18sec; 05/19/21 10.94 sec; 06/30/21: 11.37sec    ? Time 8   ? Period Weeks   ? Status Achieved   ? Target Date 07/25/21   ?  ? PT LONG TERM GOAL #3  ? Title Pt will increase 10MWT to at least 1.0 m/s in order to demonstrate community ambulation speed with less likelihood of an adverse event   ? Baseline 03/29/21 self selected:0.72ms fastest: 0.724m. 05/19/21 fastest: 1.1 m/s; 06/30/21: 0.79 self-selected   ? Time 8   ? Period Weeks   ? Status Achieved   ? Target Date 07/25/21   ?  ? PT LONG TERM GOAL #4  ? Title Patient will increase FOTO score to 49 to demonstrate predicted increase in functional mobility to complete ADLs   ? Baseline 03/29/21 40; 3/2: 58; 67 06/30/21   ? Time 8   ? Period Weeks   ? Status Achieved   ? Target Date 07/26/21   ?  ? PT LONG TERM GOAL #5  ? Title Pt will improve Oswestry score 7 points or greater to demonstrate subjective improvement in QOL related to back pain.   ? Baseline 05/30/21: 22% (score: 11)   ? Time 8   ? Period Weeks   ? Status On-going   ? Target Date 07/25/21   ?  ? PT LONG TERM GOAL #6  ? Title Pt will report being able to stand for 2 hours or greater in order to perform household work such as cooking dinner and cleaning dishes afterwards with no more than 8/10 pain (decrease in NPS by 2 points per MCID).   ? Baseline 05/30/21: able to stand for 30-45 minutes with 10/10 pain   ? Time 8   ? Period Weeks   ? Status On-going   ? Target Date 07/25/21   ? ?  ?  ? ?  ? ? ? ? ? ? ? ? Plan - 07/06/21 1512   ? ? Clinical Impression Statement Pt is pleasant and motivated throughout session. POC was continued with edits made to HEP to ensure global LE strengthening. Some exercises changed for improved muscle activation. GreenTB provided; educated on where to find  ankle weights after pt inquired. Education on d/c next session - pt eager and agreeable. Patient will continue to benefit from skilled PT to address weakness and pain deficits of the low back to return to PLOF and allow pt to participate in hobbies for improved  QOL.   ?  Personal Factors and Comorbidities Comorbidity 3+;Past/Current Experience;Time since onset of injury/illness/exacerbation;Fitness   ? Comorbidities COPD, CHF, HTN, CKD   ? Examination-Activity Limitations Bed Mobility;Carry;Squat;Lift;Reach Overhead;Stand;Stairs;Transfers   ? Examination-Participation Restrictions Community Activity;Meal Prep;Laundry;Cleaning;Valla Leaver Work   ? Stability/Clinical Decision Making Evolving/Moderate complexity   ? Rehab Potential Good   ? PT Frequency 2x / week   ? PT Duration 8 weeks   ? PT Treatment/Interventions Fish farm manager;Therapeutic activities;Neuromuscular re-education;Manual techniques;Spinal Manipulations;ADLs/Self Care Home Management;Passive range of motion;Therapeutic exercise;Functional mobility training;Stair training;Cryotherapy;Iontophoresis '4mg'$ /ml Dexamethasone;Aquatic Therapy;DME Instruction;Balance training;Patient/family education;Dry needling;Joint Manipulations;Splinting;Taping   ? PT Next Visit Plan no updates, conitnue with POC   ? PT Home Exercise Plan STS, supine piriformis, side steps with YTB, and SKTC stretch   ? Consulted and Agree with Plan of Care Patient   ? ?  ?  ? ?  ? ? ?Patient will benefit from skilled therapeutic intervention in order to improve the following deficits and impairments:  Decreased balance, Abnormal gait, Decreased activity tolerance, Decreased endurance, Decreased range of motion, Decreased strength, Increased fascial restricitons, Improper body mechanics, Pain, Postural dysfunction, Decreased coordination, Decreased mobility, Difficulty walking, Hypomobility, Increased muscle spasms, Impaired flexibility, Impaired tone ? ?Visit Diagnosis: ?Chronic right-sided low back pain, unspecified whether sciatica present ? ? ? ? ?Problem List ?Patient Active Problem List  ? Diagnosis Date Noted  ? AKI (acute kidney injury) ([place]) 07/19/2020  ? UTI (urinary tract infection) 07/18/2020  ? H/O  urinary retention 07/18/2020  ? Hyponatremia 07/18/2020  ? Acute on chronic combined systolic and diastolic CHF (congestive heart failure) ([place]) 07/12/2020  ? Hypertensive emergency 07/12/2020  ? Elevated t

## 2021-07-06 NOTE — Progress Notes (Signed)
Today had a home visit with Stacey [name].  She has been doing good.  She still having PT for her pain in her back.  She has all her medications and aware of how to take them.  She has her entresto and farxiga that is mailed to her.  She is planning a trip out of the country and advised her to place her medications in a carry on bag, do not pack in suitcase incase the plane loses them.  She is excited about her trip.  Discussed food and fluids.  Discussed wearing ted hose on the flight.  Her weight is stable.  She is watching what she eats and how much fluids.  She denies any chest pain, headaches, dizziness or increased in shortness of breath.  She has no edema in lower extremities and belly is soft.  She has everything for daily living.  Will continue to visit for heart failure, diet and medication management.  ? ?Shaida Route ?[place] EMT-Paramedic ?[phone] ?

## 2021-07-13 ENCOUNTER — Encounter: Payer: Self-pay | Admitting: Physical Therapy

## 2021-07-13 ENCOUNTER — Ambulatory Visit: Payer: Medicare Other | Admitting: Physical Therapy

## 2021-07-13 DIAGNOSIS — G8929 Other chronic pain: Secondary | ICD-10-CM

## 2021-07-13 DIAGNOSIS — M5441 Lumbago with sciatica, right side: Secondary | ICD-10-CM | POA: Diagnosis not present

## 2021-07-13 NOTE — Therapy (Signed)
[facility] ?[address] ?[place], [place], [address] ?Phone: [phone]   Fax:  [phone] ? ?Physical Therapy Treatment/Physical Therapy Discharge Summary ? ? ?Dates of reporting period  03/29/21   to   07/13/21 ? ? ?Patient Details  ?Name: Stacey [name] ?MRN: [id number] ?Date of Birth: July 01, 1945 ?Referring Provider (PT): Abernathy NP ? ? ?Encounter Date: 07/13/2021 ? ? PT End of Session - 07/13/21 1441   ? ? Visit Number 22   ? Number of Visits 29   ? Date for PT Re-Evaluation 07/25/21   ? Authorization Type Medicare primary; [employer] Secondary   ? Authorization Time Period 05/30/21-07/25/21   ? Progress Note Due on Visit 30   ? PT Start Time 1435   ? PT Stop Time 1515   ? PT Time Calculation (min) 40 min   ? Activity Tolerance Patient tolerated treatment well;No increased pain;Patient limited by fatigue   ? Behavior During Therapy [facility] for tasks assessed/performed   ? ?  ?  ? ?  ? ? ?Past Medical History:  ?Diagnosis Date  ? CHF (congestive heart failure) ([place])   ? Chronic kidney disease   ? COPD (chronic obstructive pulmonary disease) ([place])   ? Hypertension   ? Sarcoidosis   ? Thyroid disease   ? ? ?Past Surgical History:  ?Procedure Laterality Date  ? CATARACT EXTRACTION    ? EYE SURGERY Right 07/09/2019  ? GALLBLADDER SURGERY    ? KNEE SURGERY    ? thyroidectomy    ? ? ?There were no vitals filed for this visit. ? ? Subjective Assessment - 07/13/21 1438   ? ? Subjective Patient states she is doing well today. She did have some pain on Monday but has felt better the last few days. Pt asks to complete one more workout during her final session (d/c today).   ? Pertinent History Stacey [name] comes to [facility] for help with >15years LBP. Pt has fluctuating symptoms into legs, history of back injections.   ? Patient Stated Goals Would like to be able to complete walking regimen and cooking/cleaning without pain   ? Currently in Pain? No/denies    ? ?  ?  ? ?  ? ? ? ? ?DISCHARGE  ? ?OBJECTIVE - GOALS ?- Oswestry - 18% ? ? ?Intervention: ?-Nustep Seat 3, arms 9, level 3, 5 minutes ?  ?-LAQ BLE 4# AW 3x10 ?-seated hip flexion 4#AW 3x10 ?-lateral steps GreenTB at knees and RedTB at ankles, 3x10 ?-STS no UE support 3x10 ?  ?  ?Access Code: [email or web address] ?  ?  ? ?  ?Clinical Impression: Pt is pleasant throughout session. She is excited to d/c this date; asks to perform some strengthening exercises with additional time. Progressions were provided for HEP; BlueTB provided for future increase in resistance. Pt has achieved all goals set; one most meaningful is being able to stand for >2 hours in order to enjoy cooking for her family. She did improve subjectively on the Oswestry however not the full 7 points that was established as a goal. Pt has made significant improvements and demonstrates ability to complete HEP with full independence. Pt ready to d/c this date and is satisfied with progress that was made.  ? ? ? ? ? ? ? ? PT Short Term Goals - 05/19/21 1656   ? ?  ? PT SHORT TERM GOAL #1  ? Title Pt will be independent with HEP in order to  improve strength and decrease back pain in order to improve pain-free function at home and work.   ? Baseline 03/29/21 HEP given   ? Time 4   ? Period Weeks   ? Status Achieved   ? ?  ?  ? ?  ? ? ? ? PT Long Term Goals - 07/13/21 1442   ? ?  ? PT LONG TERM GOAL #1  ? Title Pt will decrease worst back pain as reported on NPRS by at least 2 points in order to demonstrate clinically significant reduction in back pain.   ? Baseline 04/08/21 10/10 LBP with activity. 05/30/21 10/10; 06/30/21: 4/10   ? Time 8   ? Period Weeks   ? Status Achieved   ? Target Date 07/25/21   ?  ? PT LONG TERM GOAL #2  ? Title Pt will decrease 5TSTS to at least 12 seconds in order to demonstrate clinically significant improvement in LE strength for age-matched strength norms   ? Baseline 03/29/21 18sec; 05/19/21 10.94 sec; 06/30/21: 11.37sec   ? Time 8   ? Period  Weeks   ? Status Achieved   ? Target Date 07/25/21   ?  ? PT LONG TERM GOAL #3  ? Title Pt will increase 10MWT to at least 1.0 m/s in order to demonstrate community ambulation speed with less likelihood of an adverse event   ? Baseline 03/29/21 self selected:0.68ms fastest: 0.72m. 05/19/21 fastest: 1.1 m/s; 06/30/21: 0.79 self-selected   ? Time 8   ? Period Weeks   ? Status Achieved   ? Target Date 07/25/21   ?  ? PT LONG TERM GOAL #4  ? Title Patient will increase FOTO score to 49 to demonstrate predicted increase in functional mobility to complete ADLs   ? Baseline 03/29/21 40; 3/2: 58; 67 06/30/21   ? Time 8   ? Period Weeks   ? Status Achieved   ? Target Date 07/26/21   ?  ? PT LONG TERM GOAL #5  ? Title Pt will improve Oswestry score 7 points or greater to demonstrate subjective improvement in QOL related to back pain.   ? Baseline 05/30/21: 22% (score: 11); 07/13/21: 18% (score: 9)   ? Time 8   ? Period Weeks   ? Status Not Met   ? Target Date 07/25/21   ?  ? PT LONG TERM GOAL #6  ? Title Pt will report being able to stand for 2 hours or greater in order to perform household work such as cooking dinner and cleaning dishes afterwards with no more than 8/10 pain (decrease in NPS by 2 points per MCID).   ? Baseline 05/30/21: able to stand for 30-45 minutes with 10/10 pain; 07/13/21: 2 hours with no pain   ? Time 8   ? Period Weeks   ? Status Achieved   ? Target Date 07/25/21   ? ?  ?  ? ?  ? ? ? ? ? ? ? ? Plan - 07/13/21 1626   ? ? Clinical Impression Statement Pt is pleasant throughout session. She is excited to d/c this date; asks to perform some strengthening exercises with additional time. Progressions were provided for HEP; BlueTB provided for future increase in resistance. Pt has achieved all goals set; one most meaningful is being able to stand for >2 hours in order to enjoy cooking for her family. She did improve subjectively on the Oswestry however not the full 7 points that was established as a  goal. Pt has  made significant improvements and demonstrates ability to complete HEP with full independence. Pt ready to d/c this date and is satisfied with progress that was made.   ? Personal Factors and Comorbidities Comorbidity 3+;Past/Current Experience;Time since onset of injury/illness/exacerbation;Fitness   ? Comorbidities COPD, CHF, HTN, CKD   ? Examination-Activity Limitations Bed Mobility;Carry;Squat;Lift;Reach Overhead;Stand;Stairs;Transfers   ? Examination-Participation Restrictions Community Activity;Meal Prep;Laundry;Cleaning;[place] [employer] Work   ? Stability/Clinical Decision Making Evolving/Moderate complexity   ? Rehab Potential Good   ? PT Frequency 2x / week   ? PT Duration 8 weeks   ? PT Treatment/Interventions Fish farm manager;Therapeutic activities;Neuromuscular re-education;Manual techniques;Spinal Manipulations;ADLs/Self Care Home Management;Passive range of motion;Therapeutic exercise;Functional mobility training;Stair training;Cryotherapy;Iontophoresis 54m/ml Dexamethasone;Aquatic Therapy;DME Instruction;Balance training;Patient/family education;Dry needling;Joint Manipulations;Splinting;Taping   ? PT Next Visit Plan no updates, conitnue with POC   ? PT Home Exercise Plan STS, supine piriformis, side steps with YTB, and SKTC stretch   ? Consulted and Agree with Plan of Care Patient   ? ?  ?  ? ?  ? ? ?Patient will benefit from skilled therapeutic intervention in order to improve the following deficits and impairments:  Decreased balance, Abnormal gait, Decreased activity tolerance, Decreased endurance, Decreased range of motion, Decreased strength, Increased fascial restricitons, Improper body mechanics, Pain, Postural dysfunction, Decreased coordination, Decreased mobility, Difficulty walking, Hypomobility, Increased muscle spasms, Impaired flexibility, Impaired tone ? ?Visit Diagnosis: ?Chronic right-sided low back pain, unspecified whether sciatica  present ? ? ? ? ?Problem List ?Patient Active Problem List  ? Diagnosis Date Noted  ? [place] 07/19[place]  ? UTI (urinary tract infection) 07/18/2020  ? H/O urinary retention 07/18/2020  ? Hyponat

## 2021-07-15 ENCOUNTER — Other Ambulatory Visit: Payer: Self-pay | Admitting: Physician Assistant

## 2021-07-15 DIAGNOSIS — J309 Allergic rhinitis, unspecified: Secondary | ICD-10-CM

## 2021-07-18 ENCOUNTER — Encounter: Payer: Self-pay | Admitting: Gastroenterology

## 2021-07-18 ENCOUNTER — Ambulatory Visit: Payer: Medicare Other | Admitting: Registered Nurse

## 2021-07-18 ENCOUNTER — Ambulatory Visit
Admission: RE | Admit: 2021-07-18 | Discharge: 2021-07-18 | Disposition: A | Discharge status: Home / Self Care | Payer: Medicare Other | Attending: Gastroenterology | Admitting: Gastroenterology

## 2021-07-18 ENCOUNTER — Other Ambulatory Visit: Payer: Self-pay | Admitting: Family

## 2021-07-18 ENCOUNTER — Encounter
Admission: RE | Disposition: A | Discharge status: Home / Self Care | Payer: Self-pay | Source: Home / Self Care | Attending: Gastroenterology

## 2021-07-18 DIAGNOSIS — K5730 Diverticulosis of large intestine without perforation or abscess without bleeding: Secondary | ICD-10-CM | POA: Diagnosis not present

## 2021-07-18 DIAGNOSIS — D120 Benign neoplasm of cecum: Secondary | ICD-10-CM | POA: Diagnosis not present

## 2021-07-18 DIAGNOSIS — J449 Chronic obstructive pulmonary disease, unspecified: Secondary | ICD-10-CM | POA: Diagnosis not present

## 2021-07-18 DIAGNOSIS — Z1211 Encounter for screening for malignant neoplasm of colon: Secondary | ICD-10-CM | POA: Insufficient documentation

## 2021-07-18 DIAGNOSIS — Z86010 Personal history of colonic polyps: Secondary | ICD-10-CM

## 2021-07-18 DIAGNOSIS — K635 Polyp of colon: Secondary | ICD-10-CM | POA: Insufficient documentation

## 2021-07-18 HISTORY — PX: COLONOSCOPY WITH PROPOFOL: SHX5780

## 2021-07-18 SURGERY — COLONOSCOPY WITH PROPOFOL
Anesthesia: General

## 2021-07-18 MED ORDER — SODIUM CHLORIDE 0.9 % IV SOLN: INTRAVENOUS | Status: DC

## 2021-07-18 MED ORDER — PROPOFOL 500 MG/50ML IV EMUL
INTRAVENOUS | Status: AC
  Filled 2021-07-18: qty 50

## 2021-07-18 MED ORDER — PROPOFOL 500 MG/50ML IV EMUL
INTRAVENOUS | Status: DC | PRN
  Administered 2021-07-18: 120 ug/kg/min via INTRAVENOUS

## 2021-07-18 MED ORDER — LIDOCAINE HCL (PF) 2 % IJ SOLN
INTRAMUSCULAR | Status: AC
  Filled 2021-07-18: qty 5

## 2021-07-18 MED ORDER — LIDOCAINE HCL (CARDIAC) PF 100 MG/5ML IV SOSY
PREFILLED_SYRINGE | INTRAVENOUS | Status: DC | PRN
  Administered 2021-07-18: 30 mg via INTRAVENOUS

## 2021-07-18 MED ORDER — PROPOFOL 10 MG/ML IV BOLUS
INTRAVENOUS | Status: DC | PRN
  Administered 2021-07-18: 70 mg via INTRAVENOUS

## 2021-07-18 MED ORDER — DAPAGLIFLOZIN PROPANEDIOL 10 MG PO TABS
10.0000 mg | ORAL_TABLET | Freq: Every day | ORAL | 3 refills | Status: DC
Start: 2021-07-18 — End: 2022-07-14

## 2021-07-18 NOTE — Progress Notes (Signed)
Printed far[employer]iga [employer] to send in with patient assistance forms.  ?

## 2021-07-18 NOTE — Anesthesia Postprocedure Evaluation (Signed)
Anesthesia Post Note ? ?Patient: Jasha Hodzic ? ?Procedure(s) Performed: COLONOSCOPY WITH PROPOFOL ? ?Patient location during evaluation: Endoscopy ?Anesthesia Type: General ?Level of consciousness: awake and alert ?Pain management: pain level controlled ?Vital Signs Assessment: post-procedure vital signs reviewed and stable ?Respiratory status: spontaneous breathing, nonlabored ventilation, respiratory function stable and patient connected to nasal cannula oxygen ?Cardiovascular status: blood pressure returned to baseline and stable ?Postop Assessment: no apparent nausea or vomiting ?Anesthetic complications: no ? ? ?No notable events documented. ? ? ?Last Vitals:  ?Vitals:  ? 07/18/21 1022 07/18/21 1032  ?BP: (!) 158/76 (!) 170/80  ?Pulse: (!) 53 (!) 51  ?Resp: 16 20  ?Temp:    ?SpO2: 100% 100%  ?  ?Last Pain:  ?Vitals:  ? 07/18/21 1032  ?TempSrc:   ?PainSc: 0-No pain  ? ? ?  ?  ?  ?  ?  ?  ? ?Arita Miss ? ? ? ? ?

## 2021-07-18 NOTE — Anesthesia Preprocedure Evaluation (Signed)
Anesthesia Evaluation  ?Patient identified by MRN, date of birth, ID band ?Patient awake ? ? ? ?Reviewed: ?Allergy & Precautions, NPO status , Patient's Chart, lab work & pertinent test results ? ?History of Anesthesia Complications ?Negative for: history of anesthetic complications ? ?Airway ?Mallampati: II ? ?TM Distance: >3 FB ?Neck ROM: Full ? ? ? Dental ?no notable dental hx. ?(+) Teeth Intact ?  ?Pulmonary ?neg sleep apnea, COPD, Patient abstained from smoking.Not current smoker,  ?  ?Pulmonary exam normal ?breath sounds clear to auscultation ? ? ? ? ? ? Cardiovascular ?Exercise Tolerance: Good ?METShypertension, Pt. 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 ?TTE 05/2021: ?INTERPRETATION  ?NORMAL LEFT VENTRICULAR SYSTOLIC FUNCTION ? 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 ?  ?Neuro/Psych ?negative neurological ROS ? negative psych ROS  ? GI/Hepatic ?GERD  Medicated and Controlled,(+)  ?  ? (-) substance abuse ? ,   ?Endo/Other  ?neg diabetesHypothyroidism  ? Renal/GU ?CRFRenal disease  ? ?  ?Musculoskeletal ? ? Abdominal ?  ?Peds ? Hematology ?  ?Anesthesia Other Findings ?Past Medical History: ?No date: CHF (congestive heart failure) ([place]) ?No date: Chronic kidney disease ?No date: COPD (chronic obstructive pulmonary disease) ([place]) ?No date: Hypertension ?No date: Sarcoidosis ?No date: Thyroid disease ? Reproductive/Obstetrics ? ?  ? ? ? ? ? ? ? ? ? ? ? ? ? ?  ?  ? ? ? ? ? ? ? ? ?Anesthesia Physical ?Anesthesia Plan ? ?ASA: 3 ? ?Anesthesia Plan: General  ? ?Post-op Pain Management: Minimal or no pain anticipated  ? ?Induction: Intravenous ? ?PONV Risk Score and Plan: 3 and Propofol infusion, TIVA and  Ondansetron ? ?Airway Management Planned: Nasal Cannula ? ?Additional Equipment: None ? ?Intra-op Plan:  ? ?Post-operative Plan:  ? ?Informed Consent: I have reviewed the patients History and Physical, chart, labs and discussed the procedure including the risks, benefits and alternatives for the proposed anesthesia with the patient or authorized representative who has indicated his/her understanding and acceptance.  ? ? ? ?Dental advisory given ? ?Plan Discussed with: CRNA and Surgeon ? ?Anesthesia Plan Comments: (Discussed risks of anesthesia with patient, including possibility of difficulty with spontaneous ventilation under anesthesia necessitating airway intervention, PONV, and rare risks such as cardiac or respiratory or neurological events, and allergic reactions. Discussed the role of CRNA in patient's perioperative care. Patient understands.)  ? ? ? ? ? ? ?Anesthesia Quick Evaluation ? ?

## 2021-07-18 NOTE — Op Note (Signed)
[facility] ?Gastroenterology ?Patient Name: Stacey [name] ?Procedure Date: 07/18/2021 9:49 AM ?MRN: [id number] ?Account #: [id number] ?Date of Birth: 08-12-1945 ?Admit Type: Outpatient ?Age: 76 ?Room: [facility] ENDO ROOM 4 ?Gender: Female ?Note Status: Finalized ?Instrument Name: Colonoscope [id number] ?Procedure:             Colonoscopy ?Indications:           Screening for colorectal malignant neoplasm ?Providers:             Jonathon Bellows MD, MD ?Referring MD:          Jonetta Osgood (Referring MD) ?Medicines:             Monitored Anesthesia Care ?Complications:         No immediate complications. ?Procedure:             Pre-Anesthesia Assessment: ?                       - Prior to the procedure, a History and Physical was  ?                       performed, and patient medications, allergies and  ?                       sensitivities were reviewed. The patient's tolerance  ?                       of previous anesthesia was reviewed. ?                       - The risks and benefits of the procedure and the  ?                       sedation options and risks were discussed with the  ?                       patient. All questions were answered and informed  ?                       consent was obtained. ?                       - ASA Grade Assessment: II - A patient with mild  ?                       systemic disease. ?                       After obtaining informed consent, the colonoscope was  ?                       passed under direct vision. Throughout the procedure,  ?                       the patient's blood pressure, pulse, and oxygen  ?                       saturations were monitored continuously. The  ?                       Colonoscope was introduced through  the anus and  ?                       advanced to the the cecum, identified by the  ?                       appendiceal orifice. The colonoscopy was performed  ?                       with ease. The patient tolerated the procedure well.  ?                        The quality of the bowel preparation was excellent. ?Findings: ?     The perianal and digital rectal examinations were normal. ?     Two sessile polyps were found in the sigmoid colon and cecum. The polyps  ?     were 6 to 8 mm in size. These polyps were removed with a cold snare.  ?     Resection and retrieval were complete. ?     The exam was otherwise without abnormality on direct and retroflexion  ?     views. ?Impression:            - Two 6 to 8 mm polyps in the sigmoid colon and in the  ?                       cecum, removed with a cold snare. Resected and  ?                       retrieved. ?                       - The examination was otherwise normal on direct and  ?                       retroflexion views. ?Recommendation:        - Discharge patient to home (with escort). ?                       - Resume previous diet. ?                       - Continue present medications. ?                       - Await pathology results. ?                       - Repeat colonoscopy for surveillance based on  ?                       pathology results. ?Procedure Code(s):     --- Professional --- ?                       [phone], Colonoscopy, flexible; with removal of  ?                       tumor(s), polyp(s), or other lesion(s) by snare  ?  technique ?Diagnosis Code(s):     --- Professional --- ?                       Z12.11, Encounter for screening for malignant neoplasm  ?                       of colon ?                       K63.5, Polyp of colon ?CPT copyright 2019 American Medical Association. All rights reserved. ?The codes documented in this report are preliminary and upon coder review may  ?be revised to meet current compliance requirements. ?Jonathon Bellows, MD ?Jonathon Bellows MD, MD ?07/18/2021 10:09:04 AM ?This report has been signed electronically. ?Number of Addenda: 0 ?Note Initiated On: 07/18/2021 9:49 AM ?Scope Withdrawal Time: 0 hours 9 minutes 9 seconds  ?Total Procedure  Duration: 0 hours 10 minutes 52 seconds  ?Estimated Blood Loss:  Estimated blood loss: none. ?     [facility] ?

## 2021-07-18 NOTE — H&P (Signed)
? ? ? ?Jonathon Bellows, MD ?[address] [place], [place], [place], [address] ?[address] [place], [place], [place], [address] ?Phone: [phone]  ?Fax: [phone] ? ?Primary Care Physician:  Jonetta Osgood, NP ? ? ?Pre-Procedure History & Physical: ?HPI:  Stacey [name] is a 76 y.o. female is here for an colonoscopy. ?  ?Past Medical History:  ?Diagnosis Date  ? CHF (congestive heart failure) ([place])   ? Chronic kidney disease   ? COPD (chronic obstructive pulmonary disease) ([place])   ? Hypertension   ? Sarcoidosis   ? Thyroid disease   ? ? ?Past Surgical History:  ?Procedure Laterality Date  ? CATARACT EXTRACTION    ? EYE SURGERY Right 07/09/2019  ? GALLBLADDER SURGERY    ? KNEE SURGERY    ? thyroidectomy    ? ? ?Prior to Admission medications   ?Medication Sig Start Date End Date Taking? Authorizing Provider  ?carvedilol (COREG) 6.25 MG tablet Take 1 tablet (6.25 mg total) by mouth 2 (two) times daily with a meal. 07/15/20  Yes Jennye Boroughs, MD  ?cetirizine (ZYRTEC) 10 MG tablet TAKE 1 TABLET(10 MG) BY MOUTH DAILY 05/18/21  Yes Abernathy, Alyssa, NP  ?cloNIDine (CATAPRES) 0.1 MG tablet Take 1 tablet (0.1 mg total) by mouth 2 (two) times daily. 03/02/21  Yes Jonetta Osgood, NP  ?dapagliflozin propanediol (FARXIGA) 10 MG TABS tablet Take 1 tablet (10 mg total) by mouth daily before breakfast. 12/10/20  Yes Alisa Graff, FNP  ?fluticasone (FLONASE) 50 MCG/ACT nasal spray Place 2 sprays into both nostrils daily. 03/23/20  Yes Luiz Ochoa, NP  ?furosemide (LASIX) 20 MG tablet Take 2 tablets (40 mg total) by mouth daily. 03/23/21  Yes Darylene Price A, FNP  ?gabapentin (NEURONTIN) 100 MG capsule Take 2 capsules (200 mg total) by mouth at bedtime. 06/07/21  Yes Abernathy, Yetta Flock, NP  ?hydrALAZINE (APRESOLINE) 100 MG tablet hydralazine 100 mg tablet   Yes [provider]  ?hydrALAZINE (APRESOLINE) 100 MG tablet TAKE 1 TABLET(100 MG) BY MOUTH THREE TIMES DAILY 04/25/21  Yes [provider]   ?hydrALAZINE (APRESOLINE) 50 MG tablet TAKE 2 TABLET BY MOUTH THREE TIMES DAILY 06/07/21  Yes Abernathy, Alyssa, NP  ?latanoprost (XALATAN) 0.005 % ophthalmic solution Place 1 drop into both eyes at bedtime.  01/16/14  Yes [provider]  ?levothyroxine (SYNTHROID, LEVOTHROID) 88 MCG tablet Take 88 mcg by mouth daily before breakfast.  03/11/17  Yes [provider]  ?meloxicam (MOBIC) 15 MG tablet TAKE 1 TABLET(15 MG) BY MOUTH DAILY 06/21/21  Yes Abernathy, Alyssa, NP  ?montelukast (SINGULAIR) 10 MG tablet TAKE 1 TABLET(10 MG) BY MOUTH DAILY 07/15/21  Yes Jonetta Osgood, NP  ?Multiple Vitamins-Minerals (MULTIVITAMIN GUMMIES WOMENS) CHEW Chew 2 tablets by mouth daily.   Yes [provider]  ?naproxen (NAPROSYN) 500 MG tablet Take 500 mg by mouth daily as needed.   Yes [provider]  ?pantoprazole (PROTONIX) 40 MG tablet TAKE 1 TABLET BY MOUTH DAILY AS NEEDED 03/22/21  Yes Abernathy, Alyssa, NP  ?pravastatin (PRAVACHOL) 20 MG tablet TAKE 1 TABLET(20 MG) BY MOUTH AT BEDTIME 05/15/21  Yes Abernathy, Alyssa, NP  ?sacubitril-valsartan (ENTRESTO) 24-26 MG Take 1 tablet by mouth 2 (two) times daily. 09/29/20  Yes Alisa Graff, FNP  ?timolol (TIMOPTIC) 0.25 % ophthalmic solution Place 1 drop into both eyes 2 (two) times daily.  07/30/09  Yes [provider]  ?timolol (TIMOPTIC) 0.5 % ophthalmic solution 1 drop 2 (two) times daily. 06/03/21  Yes [provider]  ?timolol (TIMOPTIC) 0.5 % ophthalmic solution timolol maleate 0.5 % eye drops ? INSTILL 1 DROP IN BOTH EYES TWICE DAILY 04/13/21  Yes [provider]  ?triamcinolone ointment (KENALOG) 0.1 % Apply 1 application topically 2 (two) times daily. 04/20/21  Yes Abernathy, Yetta Flock, NP  ?albuterol (VENTOLIN HFA) 108 (90 Base) MCG/ACT inhaler Inhale into the lungs every 6 (six) hours as needed for wheezing or shortness of breath. ?Patient not taking: Reported on 07/18/2021    [provider]  ?fluticasone  (FLOVENT HFA) 110 MCG/ACT inhaler Inhale 1 puff into the lungs 2 (two) times daily. ?Patient not taking: Reported on 07/18/2021 09/27/20   [name], MD  ?MELATONIN CR PO Take by mouth.    [provider]  ?ondansetron (ZOFRAN ODT) 4 MG disintegrating tablet Take 1 tablet (4 mg total) by mouth every 8 (eight) hours as needed. ?Patient not taking: Reported on 07/18/2021 06/25/20   Menshew, Dannielle Karvonen, PA-C  ? ? ?Allergies as of 06/28/2021 - Review Complete 06/28/2021  ?Allergen Reaction Noted  ? Codeine Other (See Comments) 07/08/2013  ? Propoxyphene Other (See Comments)   ? Alendronate  05/31/2011  ? Ciprofloxacin Rash 05/31/2011  ? Lisinopril    ? Alendronate sodium  06/28/2021  ? Amlodipine Swelling 09/28/2015  ? Aspirin  08/31/2015  ? Methotrexate Hives 09/18/2018  ? Tramadol Nausea And Vomiting 09/24/2014  ? Vicodin [hydrocodone-acetaminophen] Nausea And Vomiting 09/24/2014  ? ? ?Family History  ?Problem Relation Age of Onset  ? Hypertension Mother   ? Diabetes Mother   ? Anuerysm Mother   ? Diabetes Father   ? Hypertension Father   ? Prostate cancer Neg Hx   ? Bladder Cancer Neg Hx   ? Kidney cancer Neg Hx   ? Breast cancer Neg Hx   ? ? ?Social History  ? ?Socioeconomic History  ? Marital status: Married  ?  Spouse name: Not on file  ? Number of children: Not on file  ? Years of education: Not on file  ? Highest education level: Not on file  ?Occupational History  ? Not on file  ?Tobacco Use  ? Smoking status: Never  ? Smokeless tobacco: Never  ?Vaping Use  ? Vaping Use: Never used  ?Substance and Sexual Activity  ? Alcohol use: No  ? Drug use: Not Currently  ? Sexual activity: Not on file  ?Other Topics Concern  ? Not on file  ?Social History Narrative  ? Not on file  ? ?Social Determinants of Health  ? ?Financial Resource Strain: Low Risk   ? Difficulty of Paying Living Expenses: Not very hard  ?Food Insecurity: Not on file  ?Transportation Needs: Not on file  ?Physical Activity: Not on file   ?Stress: Not on file  ?Social Connections: Not on file  ?Intimate Partner Violence: Not on file  ? ? ?Review of Systems: ?See HPI, otherwise negative ROS ? ?Physical Exam: ?BP (!) 176/68   Pulse (!) 56   Temp (!) 96.9 ?F (36.1 ?C) (Temporal)   Resp 18   Ht '4\' 9"'$  (1.448 m)   Wt 62.6 kg   SpO2 97%   BMI 29.86 kg/m?  ?General:   Alert,  pleasant and cooperative in NAD ?Head:  Normocephalic and atraumatic. ?Neck:  Supple; no masses or thyromegaly. ?Lungs:  Clear throughout to auscultation, normal respiratory effort.    ?Heart:  +S1, +S2, Regular rate and rhythm, No edema. ?Abdomen:  Soft, nontender and nondistended. Normal bowel sounds, without  guarding, and without rebound.   ?Neurologic:  Alert and  oriented x4;  grossly normal neurologically. ? ?Impression/Plan: ?Stacey [name] is here for an colonoscopy to be performed for Screening colonoscopy average risk   ?Risks, benefits, limitations, and alternatives regarding  colonoscopy have been reviewed with the patient.  Questions have been answered.  All parties agreeable. ? ? ?Jonathon Bellows, MD  07/18/2021, 9:45 AM ? ?

## 2021-07-18 NOTE — Transfer of Care (Signed)
Immediate Anesthesia Transfer of Care Note ? ?Patient: Stacey [name] ? ?Procedure(s) Performed: Procedure(s): ?COLONOSCOPY WITH PROPOFOL (N/A) ? ?Patient Location: PACU and Endoscopy Unit ? ?Anesthesia Type:General ? ?Level of Consciousness: sedated ? ?Airway & Oxygen Therapy: Patient Spontanous Breathing and Patient connected to nasal cannula oxygen ? ?Post-op Assessment: Report given to RN and Post -op Vital signs reviewed and stable ? ?Post vital signs: Reviewed and stable ? ?Last Vitals:  ?Vitals:  ? 07/18/21 0853  ?BP: (!) 176/68  ?Pulse: (!) 56  ?Resp: 18  ?Temp: (!) 36.1 ?C  ?SpO2: 97%  ? ? ?Complications: No apparent anesthesia complications ?

## 2021-07-19 ENCOUNTER — Encounter: Payer: Self-pay | Admitting: Nurse Practitioner

## 2021-07-19 ENCOUNTER — Encounter: Payer: Self-pay | Admitting: Gastroenterology

## 2021-07-19 ENCOUNTER — Encounter: Payer: Medicare Other | Admitting: Physical Therapy

## 2021-07-19 LAB — SURGICAL PATHOLOGY

## 2021-07-19 NOTE — Progress Notes (Signed)
Patient has switched PCP to Dr. Netty Starring and Lang Snow NP at [facility] clinic family medicine. Upcoming appointments in June and august have been cancelled.  ?

## 2021-07-20 ENCOUNTER — Encounter: Payer: Self-pay | Admitting: Gastroenterology

## 2021-07-21 ENCOUNTER — Encounter: Payer: Medicare Other | Admitting: Physical Therapy

## 2021-07-26 DIAGNOSIS — J449 Chronic obstructive pulmonary disease, unspecified: Secondary | ICD-10-CM | POA: Diagnosis not present

## 2021-07-26 DIAGNOSIS — I509 Heart failure, unspecified: Secondary | ICD-10-CM | POA: Diagnosis not present

## 2021-07-26 DIAGNOSIS — E119 Type 2 diabetes mellitus without complications: Secondary | ICD-10-CM | POA: Diagnosis not present

## 2021-07-26 DIAGNOSIS — I110 Hypertensive heart disease with heart failure: Secondary | ICD-10-CM | POA: Diagnosis not present

## 2021-07-26 DIAGNOSIS — E039 Hypothyroidism, unspecified: Secondary | ICD-10-CM | POA: Diagnosis not present

## 2021-07-26 DIAGNOSIS — L209 Atopic dermatitis, unspecified: Secondary | ICD-10-CM | POA: Diagnosis not present

## 2021-08-02 ENCOUNTER — Encounter: Payer: Medicare Other | Admitting: Physical Therapy

## 2021-08-04 ENCOUNTER — Encounter: Payer: Medicare Other | Admitting: Physical Therapy

## 2021-08-10 ENCOUNTER — Other Ambulatory Visit (HOSPITAL_COMMUNITY): Payer: Self-pay

## 2021-08-11 ENCOUNTER — Encounter (HOSPITAL_COMMUNITY): Payer: Self-pay

## 2021-08-11 NOTE — Progress Notes (Signed)
Had a home appt with Stacey [name].  She went on a trip out of the country. She states did well, she rested when she needed to.  She states lost a couple lbs when she came back.  She took all her medications and did not miss any.  She states was tired but has rested up.  She stays very busy with church and family get togethers.  We discussed sweet and salty food at events.  She is checking her sugars once a day, went through her glucometer with her so she will know how to use it.  She has all her medications and aware of how to take them.  She is aware of up coming appts.  Mood is good.  She has support from her husband and son.  She has everything for daily living.  She denies any problems today such as chest pain, headaches, dizziness or increased shortness of breath.  Will continue to visit for heart failure, diet and medication management.   [place] [phone]

## 2021-08-30 DIAGNOSIS — M4692 Unspecified inflammatory spondylopathy, cervical region: Secondary | ICD-10-CM | POA: Diagnosis not present

## 2021-08-30 DIAGNOSIS — M47812 Spondylosis without myelopathy or radiculopathy, cervical region: Secondary | ICD-10-CM | POA: Diagnosis not present

## 2021-08-30 DIAGNOSIS — M542 Cervicalgia: Secondary | ICD-10-CM | POA: Diagnosis not present

## 2021-08-30 DIAGNOSIS — M436 Torticollis: Secondary | ICD-10-CM | POA: Diagnosis not present

## 2021-08-30 DIAGNOSIS — R300 Dysuria: Secondary | ICD-10-CM | POA: Diagnosis not present

## 2021-09-07 ENCOUNTER — Ambulatory Visit: Payer: Medicare Other | Admitting: Nurse Practitioner

## 2021-09-30 ENCOUNTER — Other Ambulatory Visit: Payer: Self-pay | Admitting: Nurse Practitioner

## 2021-09-30 DIAGNOSIS — M792 Neuralgia and neuritis, unspecified: Secondary | ICD-10-CM

## 2021-10-07 DIAGNOSIS — M810 Age-related osteoporosis without current pathological fracture: Secondary | ICD-10-CM | POA: Diagnosis not present

## 2021-10-07 DIAGNOSIS — E039 Hypothyroidism, unspecified: Secondary | ICD-10-CM | POA: Diagnosis not present

## 2021-10-07 DIAGNOSIS — E210 Primary hyperparathyroidism: Secondary | ICD-10-CM | POA: Diagnosis not present

## 2021-10-20 DIAGNOSIS — E039 Hypothyroidism, unspecified: Secondary | ICD-10-CM | POA: Diagnosis not present

## 2021-10-20 DIAGNOSIS — N1831 Chronic kidney disease, stage 3a: Secondary | ICD-10-CM | POA: Diagnosis not present

## 2021-10-20 DIAGNOSIS — E210 Primary hyperparathyroidism: Secondary | ICD-10-CM | POA: Diagnosis not present

## 2021-10-20 DIAGNOSIS — M810 Age-related osteoporosis without current pathological fracture: Secondary | ICD-10-CM | POA: Diagnosis not present

## 2021-11-02 DIAGNOSIS — E210 Primary hyperparathyroidism: Secondary | ICD-10-CM | POA: Diagnosis not present

## 2021-11-03 DIAGNOSIS — H401133 Primary open-angle glaucoma, bilateral, severe stage: Secondary | ICD-10-CM | POA: Diagnosis not present

## 2021-11-09 ENCOUNTER — Other Ambulatory Visit: Payer: Self-pay | Admitting: Family

## 2021-11-09 DIAGNOSIS — R601 Generalized edema: Secondary | ICD-10-CM

## 2021-11-15 ENCOUNTER — Other Ambulatory Visit (HOSPITAL_COMMUNITY): Payer: Self-pay

## 2021-11-15 NOTE — Progress Notes (Signed)
Today spoke with Stacey [name].  She states doing well.  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  She is planning her next vacation to the beach next week.  She is out of gabapentin and needs her new PCP to call in new prescription, advised her to call their office. She has just called in for her [name], should be here next week.  She calls it in about 2 weeks ahead.  She is aware of how to take her medications.  She has all her other medications.  She stays active in the community and around home.  She lives with husband which is her caregiver.  She has family close by for support.  She has everything needed for daily living.  She has no complaints today such as headaches, chest pain, dizziness or shortness of breath.  She watches what she eats and how much fluids.  She is aware of up coming appts.  Will continue to visit for heart failure, diet and medication management.   [place] [phone]

## 2021-11-16 ENCOUNTER — Ambulatory Visit: Payer: Medicare Other | Admitting: Nurse Practitioner

## 2021-11-22 ENCOUNTER — Other Ambulatory Visit: Payer: Self-pay | Admitting: Nurse Practitioner

## 2021-11-22 DIAGNOSIS — J309 Allergic rhinitis, unspecified: Secondary | ICD-10-CM

## 2021-12-05 ENCOUNTER — Ambulatory Visit: Payer: Medicare Other | Admitting: Family

## 2021-12-06 NOTE — Progress Notes (Unsigned)
Patient ID: Stacey [name], female    DOB: 1946/02/22, 76 y.o.   MRN: [id number]  HPI  [name] is a 76 y/o female with a history of HTN, CKD, thyroid disease, COPD and chronic heart failure.   Echo report from 05/24/21 reviewed and showed an EF of 50% along with moderate LVH, moderate MR and mild AS. Echo report from 07/12/20 reviewed and showed an EF of 20-25% along with mild MR/AR/ AS.   Has not been admitted or been in the ED in the last 6 months.   She presents today for a follow-up visit with a chief complaint of minimal fatigue upon moderate exertion. She describes this as chronic in nature although is much improved from last time she was here. She has associated cough, difficulty sleeping and chronic pain along with this. She denies any dizziness, abdominal distention, palpitations, pedal edema, chest pain, wheezing, shortness of breath or weight gain.   Says that she started taking Sea Moss Gel and is feeling "great".  Past Medical History:  Diagnosis Date   CHF (congestive heart failure) (HCC)    Chronic kidney disease    COPD (chronic obstructive pulmonary disease) (HCC)    Hypertension    Sarcoidosis    Thyroid disease    Past Surgical History:  Procedure Laterality Date   CATARACT EXTRACTION     COLONOSCOPY WITH PROPOFOL N/A 07/18/2021   Procedure: COLONOSCOPY WITH PROPOFOL;  Surgeon: Jonathon Bellows, MD;  Location: [facility] ENDOSCOPY;  Service: Gastroenterology;  Laterality: N/A;   EYE SURGERY Right 07/09/2019   GALLBLADDER SURGERY     KNEE SURGERY     thyroidectomy     Family History  Problem Relation Age of Onset   Hypertension Mother    Diabetes Mother    Anuerysm Mother    Diabetes Father    Hypertension Father    Prostate cancer Neg Hx    Bladder Cancer Neg Hx    Kidney cancer Neg Hx    Breast cancer Neg Hx    Social History   Tobacco Use   Smoking status: Never   Smokeless tobacco: Never  Substance Use Topics   Alcohol use: No   Allergies  Allergen  Reactions   Codeine Other (See Comments)    Other reaction(s): GI Intolerance Other Reaction: nausea   chest pain Nausea  Other reaction(s): GI Intolerance, Other (See Comments) Nausea GI Intolerance, nausea, chest pain    Propoxyphene Other (See Comments)    Other Reaction: nausea and chest pain   Alendronate     Other reaction(s): Other (See Comments) Chest pain   Ciprofloxacin Rash   Lisinopril     Other reaction(s): Cough   Alendronate Sodium    Amlodipine Swelling   Aspirin     Other reaction(s): Other (See Comments) Other reaction(s): Other (See Comments) Stomach hurt   Methotrexate Hives   Tramadol Nausea And Vomiting   Vicodin [Hydrocodone-Acetaminophen] Nausea And Vomiting   Prior to Admission medications   Medication Sig Start Date End Date Taking? Authorizing Provider  albuterol (VENTOLIN HFA) 108 (90 Base) MCG/ACT inhaler Inhale into the lungs every 6 (six) hours as needed for wheezing or shortness of breath.   Yes [provider]  carvedilol (COREG) 6.25 MG tablet Take 1 tablet (6.25 mg total) by mouth 2 (two) times daily with a meal. 07/15/20  Yes Jennye Boroughs, MD  cetirizine (ZYRTEC) 10 MG tablet TAKE 1 TABLET(10 MG) BY MOUTH DAILY 05/18/21  Yes Jonetta Osgood, NP  cloNIDine (CATAPRES) 0.1 MG tablet Take 1 tablet (0.1 mg total) by mouth 2 (two) times daily. 03/02/21  Yes Abernathy, Yetta Flock, NP  dapagliflozin propanediol (FARXIGA) 10 MG TABS tablet Take 1 tablet (10 mg total) by mouth daily before breakfast. 07/18/21  Yes Arihaan Bellucci A, FNP  fluticasone (FLONASE) 50 MCG/ACT nasal spray Place 2 sprays into both nostrils daily. 03/23/20  Yes Luiz Ochoa, NP  furosemide (LASIX) 20 MG tablet TAKE 2 TABLETS(40 MG) BY MOUTH DAILY 11/09/21  Yes Darylene Price A, FNP  gabapentin (NEURONTIN) 100 MG capsule Take 2 capsules (200 mg total) by mouth at bedtime. 06/07/21  Yes Abernathy, Alyssa, NP  hydrALAZINE (APRESOLINE) 100 MG tablet TAKE 1 TABLET(100 MG) BY MOUTH  THREE TIMES DAILY 04/25/21  Yes [provider]  latanoprost (XALATAN) 0.005 % ophthalmic solution Place 1 drop into both eyes at bedtime.  01/16/14  Yes [provider]  levothyroxine (SYNTHROID, LEVOTHROID) 88 MCG tablet Take 88 mcg by mouth daily before breakfast.  03/11/17  Yes [provider]  MELATONIN CR PO Take by mouth.   Yes [provider]  montelukast (SINGULAIR) 10 MG tablet TAKE 1 TABLET(10 MG) BY MOUTH DAILY 07/15/21  Yes Jonetta Osgood, NP  Multiple Vitamins-Minerals (MULTIVITAMIN GUMMIES WOMENS) CHEW Chew 2 tablets by mouth daily.   Yes [provider]  pantoprazole (PROTONIX) 40 MG tablet TAKE 1 TABLET BY MOUTH DAILY AS NEEDED 03/22/21  Yes Abernathy, Alyssa, NP  pravastatin (PRAVACHOL) 20 MG tablet TAKE 1 TABLET(20 MG) BY MOUTH AT BEDTIME 05/15/21  Yes Abernathy, Alyssa, NP  sacubitril-valsartan (ENTRESTO) 24-26 MG Take 1 tablet by mouth 2 (two) times daily. 09/29/20  Yes Kasumi Ditullio, Otila Kluver A, FNP  timolol (TIMOPTIC) 0.25 % ophthalmic solution Place 1 drop into both eyes 2 (two) times daily.  07/30/09  Yes [provider]  triamcinolone ointment (KENALOG) 0.1 % Apply 1 application topically 2 (two) times daily. 04/20/21  Yes Abernathy, Alyssa, NP  fluticasone (FLOVENT HFA) 110 MCG/ACT inhaler Inhale 1 puff into the lungs 2 (two) times daily. Patient not taking: Reported on 07/18/2021 09/27/20   [name], MD  hydrALAZINE (APRESOLINE) 100 MG tablet hydralazine 100 mg tablet Patient not taking: Reported on 08/11/2021    [provider]  hydrALAZINE (APRESOLINE) 50 MG tablet TAKE 2 TABLET BY MOUTH THREE TIMES DAILY Patient not taking: Reported on 08/11/2021 06/07/21   Jonetta Osgood, NP  meloxicam (MOBIC) 15 MG tablet TAKE 1 TABLET(15 MG) BY MOUTH DAILY Patient not taking: Reported on 12/07/2021 06/21/21   Jonetta Osgood, NP  naproxen (NAPROSYN) 500 MG tablet Take 500 mg by mouth daily as needed. Patient not taking: Reported on  11/15/2021    [provider]  ondansetron (ZOFRAN ODT) 4 MG disintegrating tablet Take 1 tablet (4 mg total) by mouth every 8 (eight) hours as needed. Patient not taking: Reported on 07/18/2021 06/25/20   Menshew, Dannielle Karvonen, PA-C  timolol (TIMOPTIC) 0.5 % ophthalmic solution 1 drop 2 (two) times daily. Patient not taking: Reported on 08/11/2021 06/03/21   [provider]  timolol (TIMOPTIC) 0.5 % ophthalmic solution timolol maleate 0.5 % eye drops  INSTILL 1 DROP IN BOTH EYES TWICE DAILY Patient not taking: Reported on 08/11/2021 04/13/21   [provider]   Review of Systems  Constitutional:  Positive for fatigue (minimal). Negative for appetite change.  HENT:  Positive for rhinorrhea. Negative for congestion, postnasal drip and sore throat.   Eyes: Negative.   Respiratory:  Positive for cough (  dry). Negative for chest tightness, shortness of breath and wheezing.   Cardiovascular:  Negative for chest pain, palpitations and leg swelling.  Gastrointestinal:  Negative for abdominal distention and abdominal pain.  Endocrine: Negative.   Genitourinary: Negative.   Musculoskeletal:  Positive for arthralgias (hands/ thumb) and back pain.  Skin: Negative.   Allergic/Immunologic: Negative.   Neurological:  Negative for dizziness and light-headedness.  Hematological:  Negative for adenopathy. Does not bruise/bleed easily.  Psychiatric/Behavioral:  Positive for sleep disturbance (sleeping on 2 pillows; falling asleep on the couch). Negative for dysphoric mood. The patient is not nervous/anxious.    Vitals:   12/07/21 1507  BP: (!) 140/52  Pulse: (!) 56  Resp: 18  SpO2: 99%  Weight: 141 lb 6 oz (64.1 kg)  Height: '4\' 9"'$  (1.448 m)   Wt Readings from Last 3 Encounters:  12/07/21 141 lb 6 oz (64.1 kg)  08/11/21 134 lb (60.8 kg)  07/18/21 138 lb (62.6 kg)   Lab Results  Component Value Date   CREATININE 1.62 (H) 01/11/2021   CREATININE 1.31 (H) 12/09/2020    CREATININE 1.32 (H) 09/28/2020   Physical Exam Vitals and nursing note reviewed.  Constitutional:      Appearance: Normal appearance.  HENT:     Head: Normocephalic and atraumatic.     Right Ear: Decreased hearing noted.     Left Ear: Decreased hearing noted.  Cardiovascular:     Rate and Rhythm: Regular rhythm. Bradycardia present.  Pulmonary:     Effort: Pulmonary effort is normal. No respiratory distress.     Breath sounds: No wheezing or rales.  Abdominal:     General: There is no distension.     Palpations: Abdomen is soft.     Tenderness: There is no abdominal tenderness.  Musculoskeletal:        General: No tenderness.     Cervical back: Normal range of motion and neck supple.     Right lower leg: No edema.     Left lower leg: No edema.  Skin:    General: Skin is warm and dry.  Neurological:     General: No focal deficit present.     Mental Status: She is alert and oriented to person, place, and time.  Psychiatric:        Mood and Affect: Mood normal.        Behavior: Behavior normal.        Thought Content: Thought content normal.    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)- - NYHA class II - euvolemic today - weighing daily; reminded to call for an overnight weight gain of > 2 pounds or a weekly weight gain of > 5 pounds - weight up 2 pounds from last visit here 6 months ago - not adding salt and has been reading food labels for sodium content - saw cardiology Petra Kuba) 06/16/21 - on GDMT of farxiga and entresto - receiving entresto through [employer] patient assistance program - participating in paramedicine program - BNP 07/18/20 was 68.3  2: HTN with CKD- - BP mildly elevated (140/52) but she it was 128/80 at her PCP office earlier today - now taking Sea Moss Gel and reports feeling so much better - saw PCP ([facility]) earlier today - BMP 10/07/21 reviewed and showed sodium 138, potassium 4.2,  creatinine 1.6 and GFR 38  3: 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 Each medication was verbally reviewed with the patient and she was encouraged to bring the bottles to every visit to confirm accuracy of list.   Return in 6 months, sooner if needed.

## 2021-12-07 ENCOUNTER — Ambulatory Visit: Payer: Medicare Other | Attending: Family | Admitting: Family

## 2021-12-07 ENCOUNTER — Encounter: Payer: Self-pay | Admitting: Family

## 2021-12-07 VITALS — BP 140/52 | HR 56 | Resp 18 | Ht <= 58 in | Wt 141.4 lb

## 2021-12-07 DIAGNOSIS — E039 Hypothyroidism, unspecified: Secondary | ICD-10-CM | POA: Diagnosis not present

## 2021-12-07 DIAGNOSIS — Z7984 Long term (current) use of oral hypoglycemic drugs: Secondary | ICD-10-CM | POA: Diagnosis not present

## 2021-12-07 DIAGNOSIS — E079 Disorder of thyroid, unspecified: Secondary | ICD-10-CM | POA: Insufficient documentation

## 2021-12-07 DIAGNOSIS — G479 Sleep disorder, unspecified: Secondary | ICD-10-CM | POA: Insufficient documentation

## 2021-12-07 DIAGNOSIS — E890 Postprocedural hypothyroidism: Secondary | ICD-10-CM | POA: Diagnosis not present

## 2021-12-07 DIAGNOSIS — N189 Chronic kidney disease, unspecified: Secondary | ICD-10-CM | POA: Diagnosis not present

## 2021-12-07 DIAGNOSIS — E119 Type 2 diabetes mellitus without complications: Secondary | ICD-10-CM | POA: Diagnosis not present

## 2021-12-07 DIAGNOSIS — I509 Heart failure, unspecified: Secondary | ICD-10-CM | POA: Diagnosis not present

## 2021-12-07 DIAGNOSIS — J3089 Other allergic rhinitis: Secondary | ICD-10-CM | POA: Diagnosis not present

## 2021-12-07 DIAGNOSIS — E782 Mixed hyperlipidemia: Secondary | ICD-10-CM | POA: Diagnosis not present

## 2021-12-07 DIAGNOSIS — I110 Hypertensive heart disease with heart failure: Secondary | ICD-10-CM | POA: Diagnosis not present

## 2021-12-07 DIAGNOSIS — I5032 Chronic diastolic (congestive) heart failure: Secondary | ICD-10-CM | POA: Insufficient documentation

## 2021-12-07 DIAGNOSIS — J449 Chronic obstructive pulmonary disease, unspecified: Secondary | ICD-10-CM | POA: Diagnosis not present

## 2021-12-07 DIAGNOSIS — G8929 Other chronic pain: Secondary | ICD-10-CM | POA: Diagnosis not present

## 2021-12-07 DIAGNOSIS — I130 Hypertensive heart and chronic kidney disease with heart failure and stage 1 through stage 4 chronic kidney disease, or unspecified chronic kidney disease: Secondary | ICD-10-CM | POA: Diagnosis not present

## 2021-12-07 DIAGNOSIS — Z0000 Encounter for general adult medical examination without abnormal findings: Secondary | ICD-10-CM | POA: Diagnosis not present

## 2021-12-07 DIAGNOSIS — I10 Essential (primary) hypertension: Secondary | ICD-10-CM

## 2021-12-07 DIAGNOSIS — E210 Primary hyperparathyroidism: Secondary | ICD-10-CM | POA: Insufficient documentation

## 2021-12-07 DIAGNOSIS — N390 Urinary tract infection, site not specified: Secondary | ICD-10-CM | POA: Diagnosis not present

## 2021-12-07 NOTE — Patient Instructions (Signed)
Continue weighing daily and call for an overnight weight gain of 3 pounds or more or a weekly weight gain of more than 5 pounds  If you have voicemail, please make sure your mailbox is cleaned out so that we may leave a message and please make sure to listen to any voicemails.    If you receive a satisfaction survey regarding the Heart Failure Clinic, please take the time to fill it out. This way we can continue to provide excellent care and make any changes that need to be made.     

## 2021-12-08 ENCOUNTER — Encounter: Payer: Self-pay | Admitting: Family

## 2021-12-08 DIAGNOSIS — H401133 Primary open-angle glaucoma, bilateral, severe stage: Secondary | ICD-10-CM | POA: Diagnosis not present

## 2021-12-08 DIAGNOSIS — H35353 Cystoid macular degeneration, bilateral: Secondary | ICD-10-CM | POA: Diagnosis not present

## 2021-12-08 DIAGNOSIS — H33313 Horseshoe tear of retina without detachment, bilateral: Secondary | ICD-10-CM | POA: Diagnosis not present

## 2021-12-08 DIAGNOSIS — H04123 Dry eye syndrome of bilateral lacrimal glands: Secondary | ICD-10-CM | POA: Diagnosis not present

## 2021-12-22 DIAGNOSIS — J209 Acute bronchitis, unspecified: Secondary | ICD-10-CM | POA: Diagnosis not present

## 2021-12-22 DIAGNOSIS — H66001 Acute suppurative otitis media without spontaneous rupture of ear drum, right ear: Secondary | ICD-10-CM | POA: Diagnosis not present

## 2021-12-28 DIAGNOSIS — I119 Hypertensive heart disease without heart failure: Secondary | ICD-10-CM | POA: Diagnosis not present

## 2021-12-28 DIAGNOSIS — I420 Dilated cardiomyopathy: Secondary | ICD-10-CM | POA: Diagnosis not present

## 2021-12-28 DIAGNOSIS — I6523 Occlusion and stenosis of bilateral carotid arteries: Secondary | ICD-10-CM | POA: Diagnosis not present

## 2021-12-28 DIAGNOSIS — I340 Nonrheumatic mitral (valve) insufficiency: Secondary | ICD-10-CM | POA: Diagnosis not present

## 2021-12-28 DIAGNOSIS — R001 Bradycardia, unspecified: Secondary | ICD-10-CM | POA: Diagnosis not present

## 2021-12-28 DIAGNOSIS — I10 Essential (primary) hypertension: Secondary | ICD-10-CM | POA: Diagnosis not present

## 2021-12-28 DIAGNOSIS — E782 Mixed hyperlipidemia: Secondary | ICD-10-CM | POA: Diagnosis not present

## 2021-12-28 DIAGNOSIS — I5022 Chronic systolic (congestive) heart failure: Secondary | ICD-10-CM | POA: Diagnosis not present

## 2021-12-28 DIAGNOSIS — I350 Nonrheumatic aortic (valve) stenosis: Secondary | ICD-10-CM | POA: Diagnosis not present

## 2022-01-05 DIAGNOSIS — Z23 Encounter for immunization: Secondary | ICD-10-CM | POA: Diagnosis not present

## 2022-01-23 ENCOUNTER — Encounter (HOSPITAL_COMMUNITY): Payer: Self-pay

## 2022-01-23 ENCOUNTER — Other Ambulatory Visit (HOSPITAL_COMMUNITY): Payer: Self-pay

## 2022-01-23 NOTE — Progress Notes (Signed)
Today had a home visit with Stacey [name].  She appears to be doing well.  She has done a lot of traveling lately.  She is babysitting a few times a week.  She keeps busy doing several things and feels good doing it.  She has all her medications.  She is weighing daily and weight is staying the same.  She watches what she eats and drinks.  She is aware of up coming appts.  She lives with her husband and has has plenty of family support.  She denies any problems such as chest pain, headaches, dizziness or increased shortness of breath.  Lungs are clear and no edema in lower extremities.  Abdomen is soft.  She has everything for daily living.  Will continue to visit for heart failure, diet and medication compliance.   [place] [phone]

## 2022-03-16 DIAGNOSIS — M25511 Pain in right shoulder: Secondary | ICD-10-CM | POA: Diagnosis not present

## 2022-03-16 DIAGNOSIS — E119 Type 2 diabetes mellitus without complications: Secondary | ICD-10-CM | POA: Diagnosis not present

## 2022-05-11 ENCOUNTER — Other Ambulatory Visit: Payer: Self-pay | Admitting: Nurse Practitioner

## 2022-05-11 DIAGNOSIS — E782 Mixed hyperlipidemia: Secondary | ICD-10-CM

## 2022-05-18 ENCOUNTER — Other Ambulatory Visit: Payer: Self-pay | Admitting: Family

## 2022-05-18 MED ORDER — SACUBITRIL-VALSARTAN 24-26 MG PO TABS
1.0000 | ORAL_TABLET | Freq: Two times a day (BID) | ORAL | 3 refills | Status: DC
Start: 2022-05-18 — End: 2022-06-07

## 2022-05-19 ENCOUNTER — Other Ambulatory Visit (HOSPITAL_COMMUNITY): Payer: Self-pay

## 2022-05-24 ENCOUNTER — Other Ambulatory Visit (HOSPITAL_COMMUNITY): Payer: Self-pay

## 2022-05-25 ENCOUNTER — Other Ambulatory Visit (HOSPITAL_COMMUNITY): Payer: Self-pay

## 2022-05-25 ENCOUNTER — Other Ambulatory Visit: Payer: Self-pay

## 2022-05-27 ENCOUNTER — Other Ambulatory Visit: Payer: Self-pay | Admitting: Nurse Practitioner

## 2022-05-27 DIAGNOSIS — I10 Essential (primary) hypertension: Secondary | ICD-10-CM

## 2022-05-29 ENCOUNTER — Telehealth: Payer: Self-pay | Admitting: *Deleted

## 2022-05-29 NOTE — Telephone Encounter (Signed)
[employer] paperwork faxed and placed in scan folder. Pt aware.

## 2022-06-01 ENCOUNTER — Ambulatory Visit: Payer: 59 | Admitting: *Deleted

## 2022-06-07 ENCOUNTER — Encounter: Payer: Self-pay | Admitting: Family

## 2022-06-07 ENCOUNTER — Other Ambulatory Visit (HOSPITAL_COMMUNITY): Payer: Self-pay

## 2022-06-07 ENCOUNTER — Other Ambulatory Visit: Payer: Self-pay | Admitting: Family

## 2022-06-07 ENCOUNTER — Ambulatory Visit: Payer: Medicare Other | Attending: Family | Admitting: Family

## 2022-06-07 VITALS — BP 174/76 | HR 68 | Wt 139.5 lb

## 2022-06-07 DIAGNOSIS — E210 Primary hyperparathyroidism: Secondary | ICD-10-CM | POA: Insufficient documentation

## 2022-06-07 DIAGNOSIS — J449 Chronic obstructive pulmonary disease, unspecified: Secondary | ICD-10-CM | POA: Insufficient documentation

## 2022-06-07 DIAGNOSIS — Z8249 Family history of ischemic heart disease and other diseases of the circulatory system: Secondary | ICD-10-CM | POA: Diagnosis not present

## 2022-06-07 DIAGNOSIS — Z79899 Other long term (current) drug therapy: Secondary | ICD-10-CM | POA: Insufficient documentation

## 2022-06-07 DIAGNOSIS — I10 Essential (primary) hypertension: Secondary | ICD-10-CM | POA: Diagnosis not present

## 2022-06-07 DIAGNOSIS — N189 Chronic kidney disease, unspecified: Secondary | ICD-10-CM | POA: Diagnosis not present

## 2022-06-07 DIAGNOSIS — I130 Hypertensive heart and chronic kidney disease with heart failure and stage 1 through stage 4 chronic kidney disease, or unspecified chronic kidney disease: Secondary | ICD-10-CM | POA: Insufficient documentation

## 2022-06-07 DIAGNOSIS — I5032 Chronic diastolic (congestive) heart failure: Secondary | ICD-10-CM | POA: Insufficient documentation

## 2022-06-07 MED ORDER — SACUBITRIL-VALSARTAN 24-26 MG PO TABS: 1.0000 | ORAL_TABLET | Freq: Two times a day (BID) | ORAL | 3 refills | Status: DC

## 2022-06-07 NOTE — Progress Notes (Signed)
Patient ID: Stacey [name], female    DOB: 26-Jul-1945, 77 y.o.   MRN: [id number]  HPI  Stacey [name] is a 77 y/o female with a history of HTN, CKD, thyroid disease, COPD and chronic heart failure.   Echo 05/24/21: EF of 50% along with moderate LVH, moderate MR and mild AS. Echo 07/12/20: EF of 20-25% along with mild MR/AR/ AS.   Has not been admitted or been in the ED in the last 6 months.   She presents today for a HF follow-up visit with a chief complaint of minimal fatigue upon moderate exertion. Chronic in nature. Has associated head congestion, cough, SOB, light-headedness and chronic difficulty sleeping along with this. Denies abdominal distention, palpitations, pedal edema, chest pain, wheezing or weight gain.   Has been out of entresto since 02/2022 and she's unsure of the status of her [employer] application. She has not taken her clonidine this morning.   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   Past Medical History:  Diagnosis Date   CHF (congestive heart failure) (HCC)    Chronic kidney disease    COPD (chronic obstructive pulmonary disease) (HCC)    Hypertension    Sarcoidosis    Thyroid disease    Past Surgical History:  Procedure Laterality Date   CATARACT EXTRACTION     COLONOSCOPY WITH PROPOFOL N/A 07/18/2021   Procedure: COLONOSCOPY WITH PROPOFOL;  Surgeon: Jonathon Bellows, MD;  Location: [facility] ENDOSCOPY;  Service: Gastroenterology;  Laterality: N/A;   EYE SURGERY Right 07/09/2019   GALLBLADDER SURGERY     KNEE SURGERY     thyroidectomy     Family History  Problem Relation Age of Onset   Hypertension Mother    Diabetes Mother    Anuerysm Mother    Diabetes Father    Hypertension Father    Prostate cancer Neg Hx    Bladder Cancer Neg Hx    Kidney cancer Neg Hx    Breast cancer Neg Hx    Social History   Tobacco Use   Smoking status: Never   Smokeless tobacco: Never  Substance Use Topics   Alcohol use: No   Allergies  Allergen Reactions    Codeine Other (See Comments)    Other reaction(s): GI Intolerance Other Reaction: nausea   chest pain Nausea  Other reaction(s): GI Intolerance, Other (See Comments) Nausea GI Intolerance, nausea, chest pain    Propoxyphene Other (See Comments)    Other Reaction: nausea and chest pain   Alendronate     Other reaction(s): Other (See Comments) Chest pain   Ciprofloxacin Rash   Lisinopril     Other reaction(s): Cough   Alendronate Sodium    Amlodipine Swelling   Aspirin     Other reaction(s): Other (See Comments) Other reaction(s): Other (See Comments) Stomach hurt   Methotrexate Hives   Tramadol Nausea And Vomiting   Vicodin [Hydrocodone-Acetaminophen] Nausea And Vomiting   Prior to Admission medications   Medication Sig Start Date End Date Taking? Authorizing Provider  albuterol (VENTOLIN HFA) 108 (90 Base) MCG/ACT inhaler Inhale into the lungs every 6 (six) hours as needed for wheezing or shortness of breath.   Yes [provider]  carvedilol (COREG) 6.25 MG tablet Take 1 tablet (6.25 mg total) by mouth 2 (two) times daily with a meal. 07/15/20  Yes Jennye Boroughs, MD  cetirizine (ZYRTEC) 10 MG tablet TAKE 1 TABLET(10 MG) BY MOUTH DAILY 05/18/21  Yes Jonetta Osgood, NP  cloNIDine (CATAPRES) 0.1 MG tablet Take 1 tablet (0.1 mg total) by mouth 2 (two) times daily. 03/02/21  Yes Abernathy, Yetta Flock, NP  dapagliflozin propanediol (FARXIGA) 10 MG TABS tablet Take 1 tablet (10 mg total) by mouth daily before breakfast. 07/18/21  Yes Talmadge Ganas A, FNP  fluticasone (FLONASE) 50 MCG/ACT nasal spray Place 2 sprays into both nostrils daily. 03/23/20  Yes Luiz Ochoa, NP  fluticasone (FLOVENT HFA) 110 MCG/ACT inhaler Inhale 1 puff into the lungs 2 (two) times daily. 09/27/20  Yes [name], MD  furosemide (LASIX) 20 MG tablet Take by mouth. Take 1 tablet by mouth every other day alternating with 2 tablets every other day.   Yes [provider]  gabapentin (NEURONTIN)  100 MG capsule Take 2 capsules (200 mg total) by mouth at bedtime. 06/07/21  Yes Abernathy, Alyssa, NP  hydrALAZINE (APRESOLINE) 100 MG tablet TAKE 1 TABLET(100 MG) BY MOUTH THREE TIMES DAILY 04/25/21  Yes [provider]  latanoprost (XALATAN) 0.005 % ophthalmic solution Place 1 drop into both eyes at bedtime.  01/16/14  Yes [provider]  levothyroxine (SYNTHROID, LEVOTHROID) 88 MCG tablet Take 88 mcg by mouth daily before breakfast.  03/11/17  Yes [provider]  MELATONIN CR PO Take by mouth.   Yes [provider]  meloxicam (MOBIC) 15 MG tablet TAKE 1 TABLET(15 MG) BY MOUTH DAILY 06/21/21  Yes Abernathy, Alyssa, NP  montelukast (SINGULAIR) 10 MG tablet TAKE 1 TABLET(10 MG) BY MOUTH DAILY 07/15/21  Yes Jonetta Osgood, NP  Multiple Vitamins-Minerals (MULTIVITAMIN GUMMIES WOMENS) CHEW Chew 2 tablets by mouth daily.   Yes [provider]  naproxen (NAPROSYN) 500 MG tablet Take 500 mg by mouth daily as needed.   Yes [provider]  ondansetron (ZOFRAN ODT) 4 MG disintegrating tablet Take 1 tablet (4 mg total) by mouth every 8 (eight) hours as needed. 06/25/20  Yes Menshew, Dannielle Karvonen, PA-C  pantoprazole (PROTONIX) 40 MG tablet TAKE 1 TABLET BY MOUTH DAILY AS NEEDED 03/22/21  Yes Abernathy, Alyssa, NP  pravastatin (PRAVACHOL) 20 MG tablet TAKE 1 TABLET(20 MG) BY MOUTH AT BEDTIME 05/15/21  Yes Abernathy, Alyssa, NP  timolol (TIMOPTIC) 0.25 % ophthalmic solution Place 1 drop into both eyes 2 (two) times daily.  07/30/09  Yes [provider]  triamcinolone ointment (KENALOG) 0.1 % Apply 1 application topically 2 (two) times daily. 04/20/21  Yes Jonetta Osgood, NP    Review of Systems  Constitutional:  Positive for fatigue (minimal). Negative for appetite change.  HENT:  Positive for congestion and rhinorrhea. Negative for postnasal drip and sore throat.   Eyes: Negative.   Respiratory:  Positive for cough (dry) and shortness of breath.  Negative for chest tightness and wheezing.   Cardiovascular:  Negative for chest pain, palpitations and leg swelling.  Gastrointestinal:  Negative for abdominal distention and abdominal pain.  Endocrine: Negative.   Genitourinary: Negative.   Musculoskeletal:  Positive for back pain.  Skin: Negative.   Allergic/Immunologic: Negative.   Neurological:  Positive for light-headedness. Negative for dizziness.  Hematological:  Negative for adenopathy. Does not bruise/bleed easily.  Psychiatric/Behavioral:  Positive for sleep disturbance (sleeping on 2 pillows). Negative for dysphoric mood. The patient is not nervous/anxious.    Vitals:   06/07/22 1117  BP: (!) 174/76  Pulse: 68  SpO2: 100%  Weight: 139 lb 8 oz (63.3 kg)   Wt Readings from Last 3 Encounters:  06/07/22 139 lb 8 oz (63.3 kg)  01/23/22 141  lb (64 kg)  12/07/21 141 lb 6 oz (64.1 kg)   Lab Results  Component Value Date   CREATININE 1.62 (H) 01/11/2021   CREATININE 1.31 (H) 12/09/2020   CREATININE 1.32 (H) 09/28/2020   Physical Exam Vitals and nursing note reviewed.  Constitutional:      Appearance: Normal appearance.  HENT:     Head: Normocephalic and atraumatic.     Right Ear: Decreased hearing noted.     Left Ear: Decreased hearing noted.  Cardiovascular:     Rate and Rhythm: Normal rate and regular rhythm.  Pulmonary:     Effort: Pulmonary effort is normal. No respiratory distress.     Breath sounds: No wheezing or rales.  Abdominal:     General: There is no distension.     Palpations: Abdomen is soft.     Tenderness: There is no abdominal tenderness.  Musculoskeletal:        General: No tenderness.     Cervical back: Normal range of motion and neck supple.     Right lower leg: No edema.     Left lower leg: No edema.  Skin:    General: Skin is warm and dry.  Neurological:     General: No focal deficit present.     Mental Status: She is alert and oriented to person, place, and time.  Psychiatric:         Mood and Affect: Mood normal.        Behavior: Behavior normal.        Thought Content: Thought content normal.    Assessment & Plan:  1: Chronic heart failure with preserved ejection fraction with LVH- - NYHA class II - euvolemic today - weighing daily; reminded to call for an overnight weight gain of > 2 pounds or a weekly weight gain of > 5 pounds - weight down 2 pounds from last visit here 6 months ago - echo 05/24/21: EF of 50% along with moderate LVH, moderate MR and mild AS. Echo 07/12/20: EF of 20-25% along with mild MR/AR/ AS.  - not adding salt and has been reading food labels for sodium content - saw cardiology Albertine Patricia) 12/28/21 - farxiga 10mg  daily - entresto 24/26mg  BID; has been out of this for 3 months; samples provided today by PharmD and will check on status of assistance application - carvedilol 6.25mg  BID - lasix 20mg  daily - BNP 07/18/20 was 68.3 - PharmD reconciled meds w/ patient  2: HTN with CKD- - BP 174/76 ; has not taken clonidine yet today and has been without entresto for ~ 3 months - hydralazine 100mg  TID - clonidine 0.1mg  BID - saw PCP ([place]) 04/27/22  - BMP 04/20/22 reviewed and showed sodium 141, potassium 4.3, creatinine 1.5 and GFR 36  3: 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

## 2022-06-07 NOTE — Progress Notes (Signed)
Called [employer] patient assistance. They need the provider form and a new prescription to be sent for the patient to receive medication. Tina aware. Call on 3/22 to check on status of application. Patient was given 1 month supply of entresto samples to go home with.    Eleonore Chiquito, PharmD, BCPS

## 2022-06-07 NOTE — Progress Notes (Signed)
Printed entresto RX to send to [employer] patient assistance

## 2022-06-08 NOTE — Progress Notes (Signed)
Entresto patient assistance approved until 03/20/2023. Patient notified.   Phone number and instruction to order mediation provided.  Sejla Marzano Rodriguez-Guzman PharmD, BCPS 06/08/2022 10:34 AM

## 2022-06-08 NOTE — Progress Notes (Signed)
Providers application and new rx faxed 3/21

## 2022-06-09 IMAGING — DX DG CHEST 1V PORT
1 series · 1 of 1 positions shown · non-contrast
Comparison: Chest x-ray 01/12/2018, CT chest 04/26/2017, CT chest
10/04/2012, CT chest 04/26/2017

CLINICAL DATA: Shortness of breath.  History of COPD.

EXAM:
PORTABLE CHEST 1 VIEW

[chest ap]
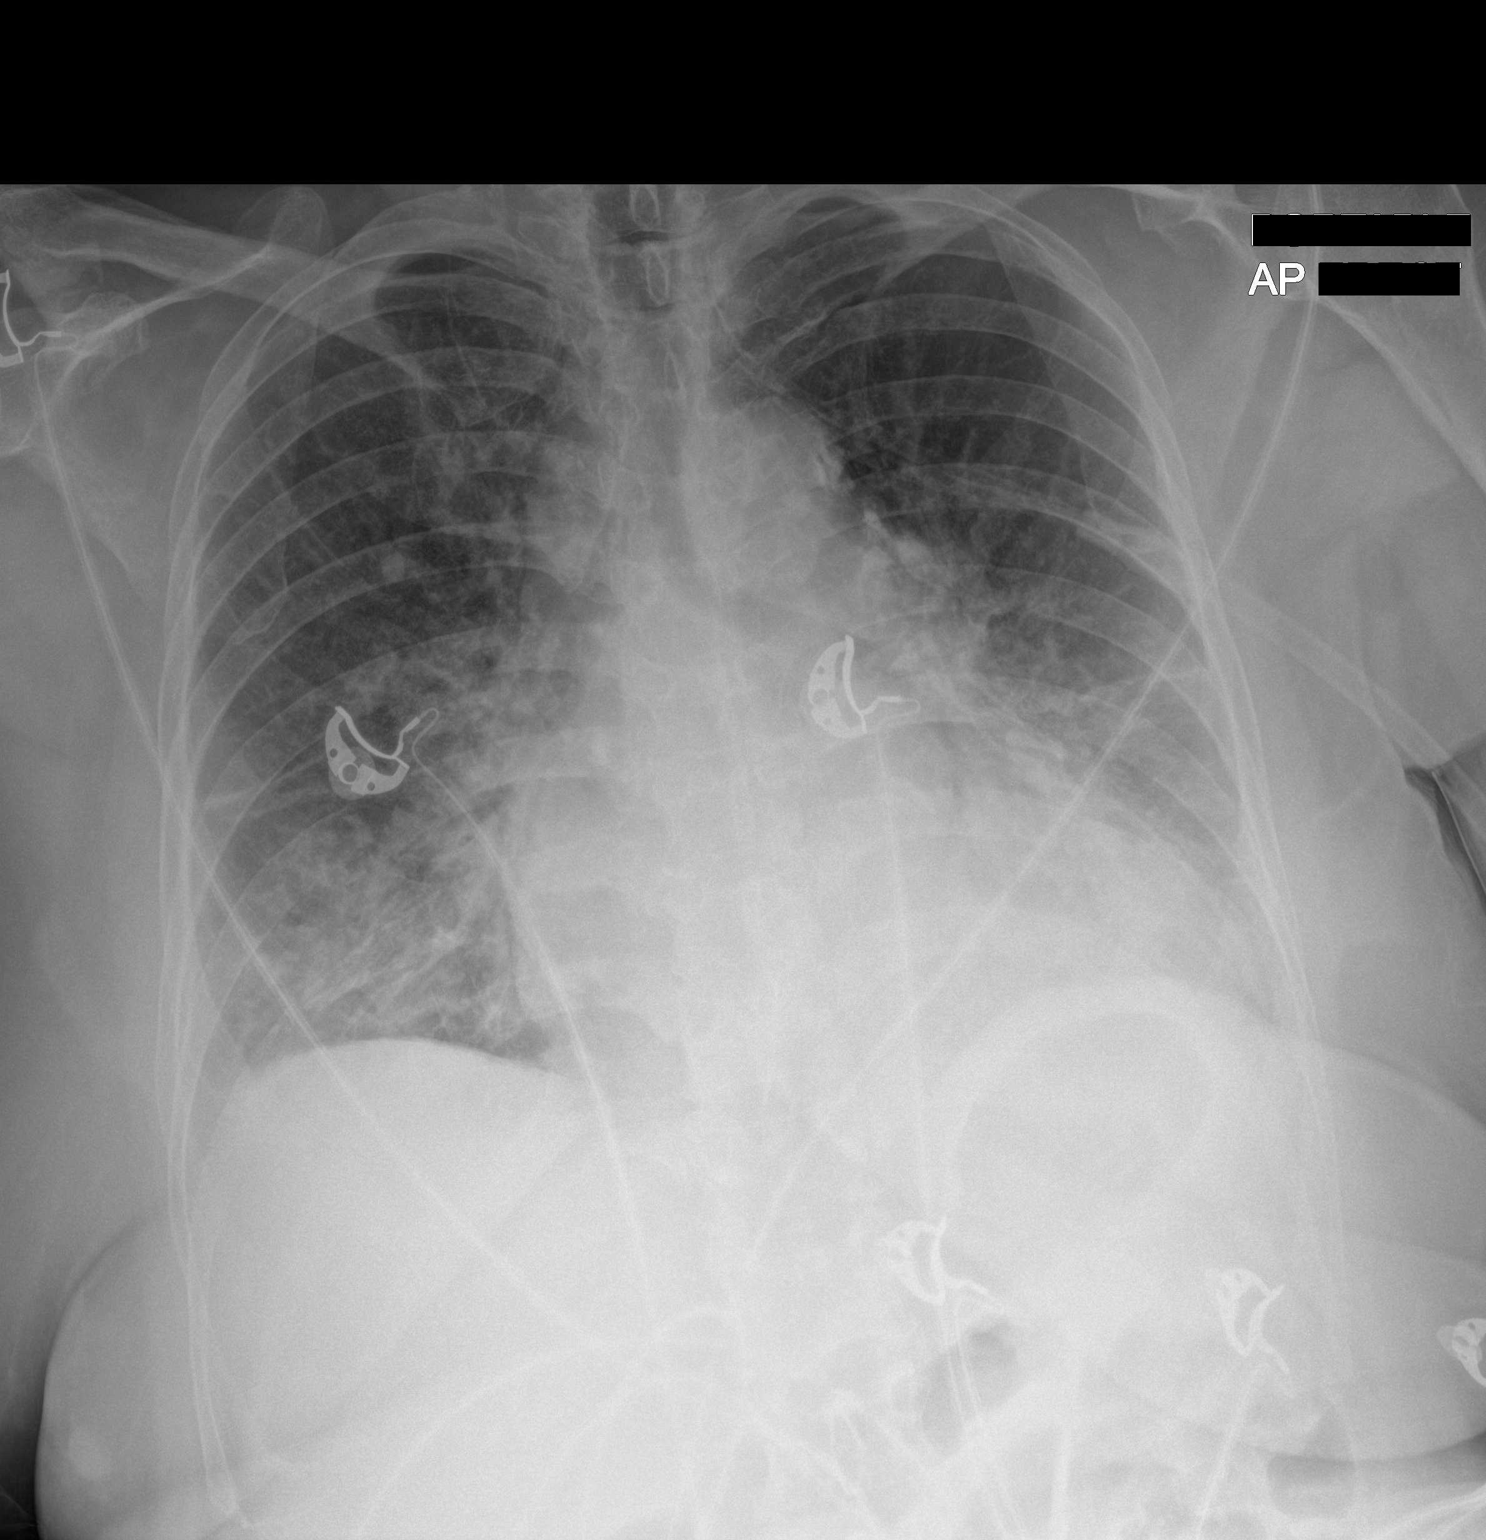

[1 of 1 positions shown; findings below may reference images not displayed]

FINDINGS: Redemonstration of a enlarged cardiac silhouette. The heart size and
mediastinal contours are unchanged. Aortic arch calcifications.

Right middle lobe airspace opacity. Question retrocardiac opacity.
Increased interstitial markings. No pleural effusion. No
pneumothorax.

No acute osseous abnormality.
IMPRESSION: 1. Right middle lobe airspace opacities suggestive of infection or
inflammation. Also question retrocardiac opacity. Followup PA and
lateral chest X-ray is recommended in 3-4 weeks following therapy to
ensure resolution and exclude underlying malignancy.
2. Mild pulmonary edema.
3.  Aortic Atherosclerosis ([id number]).

## 2022-06-10 IMAGING — CR DG CHEST 2V
2 series · 2 of 2 positions shown · non-contrast
Comparison: July 12, 2020

CLINICAL DATA: Shortness of breath

EXAM:
CHEST - 2 VIEW

[chest pa]
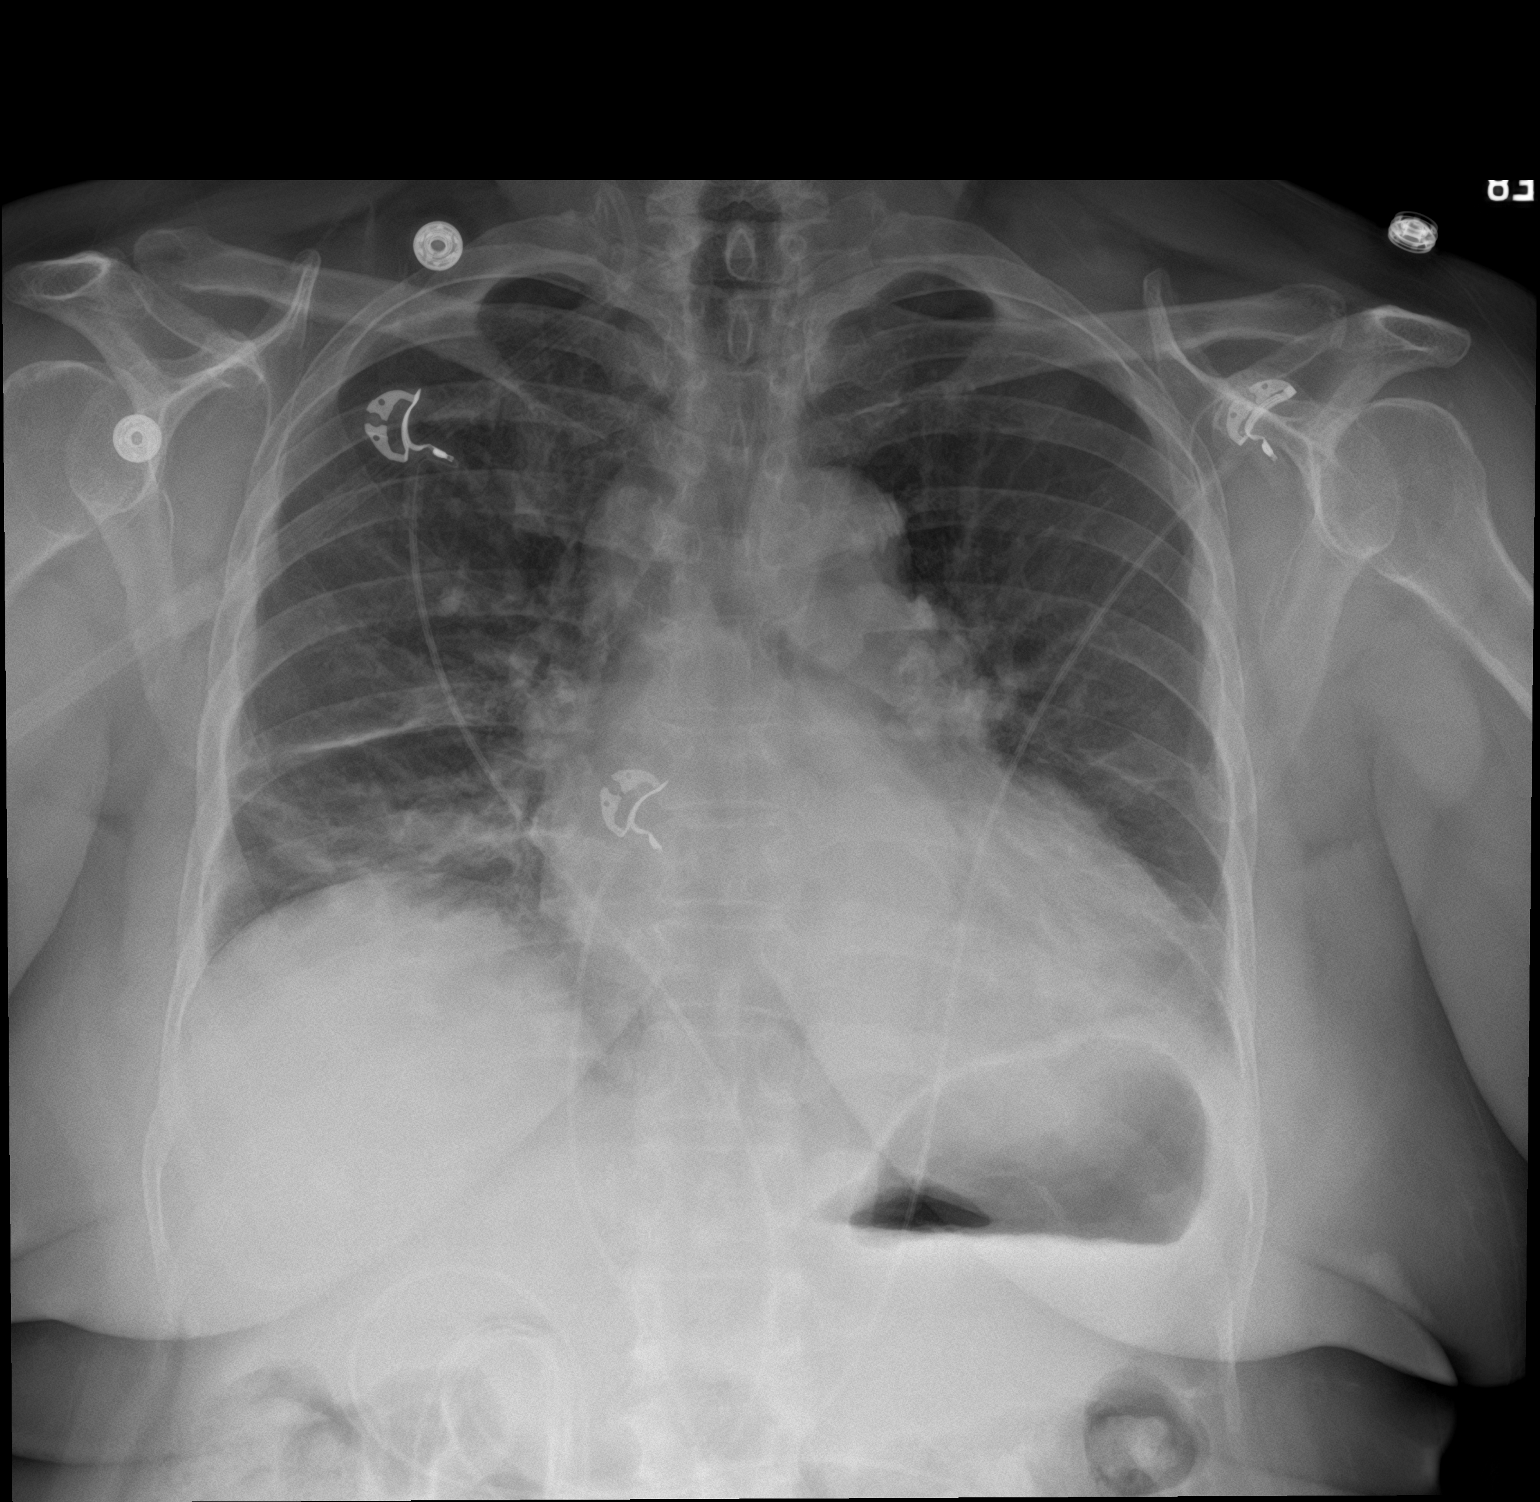

[chest lat]
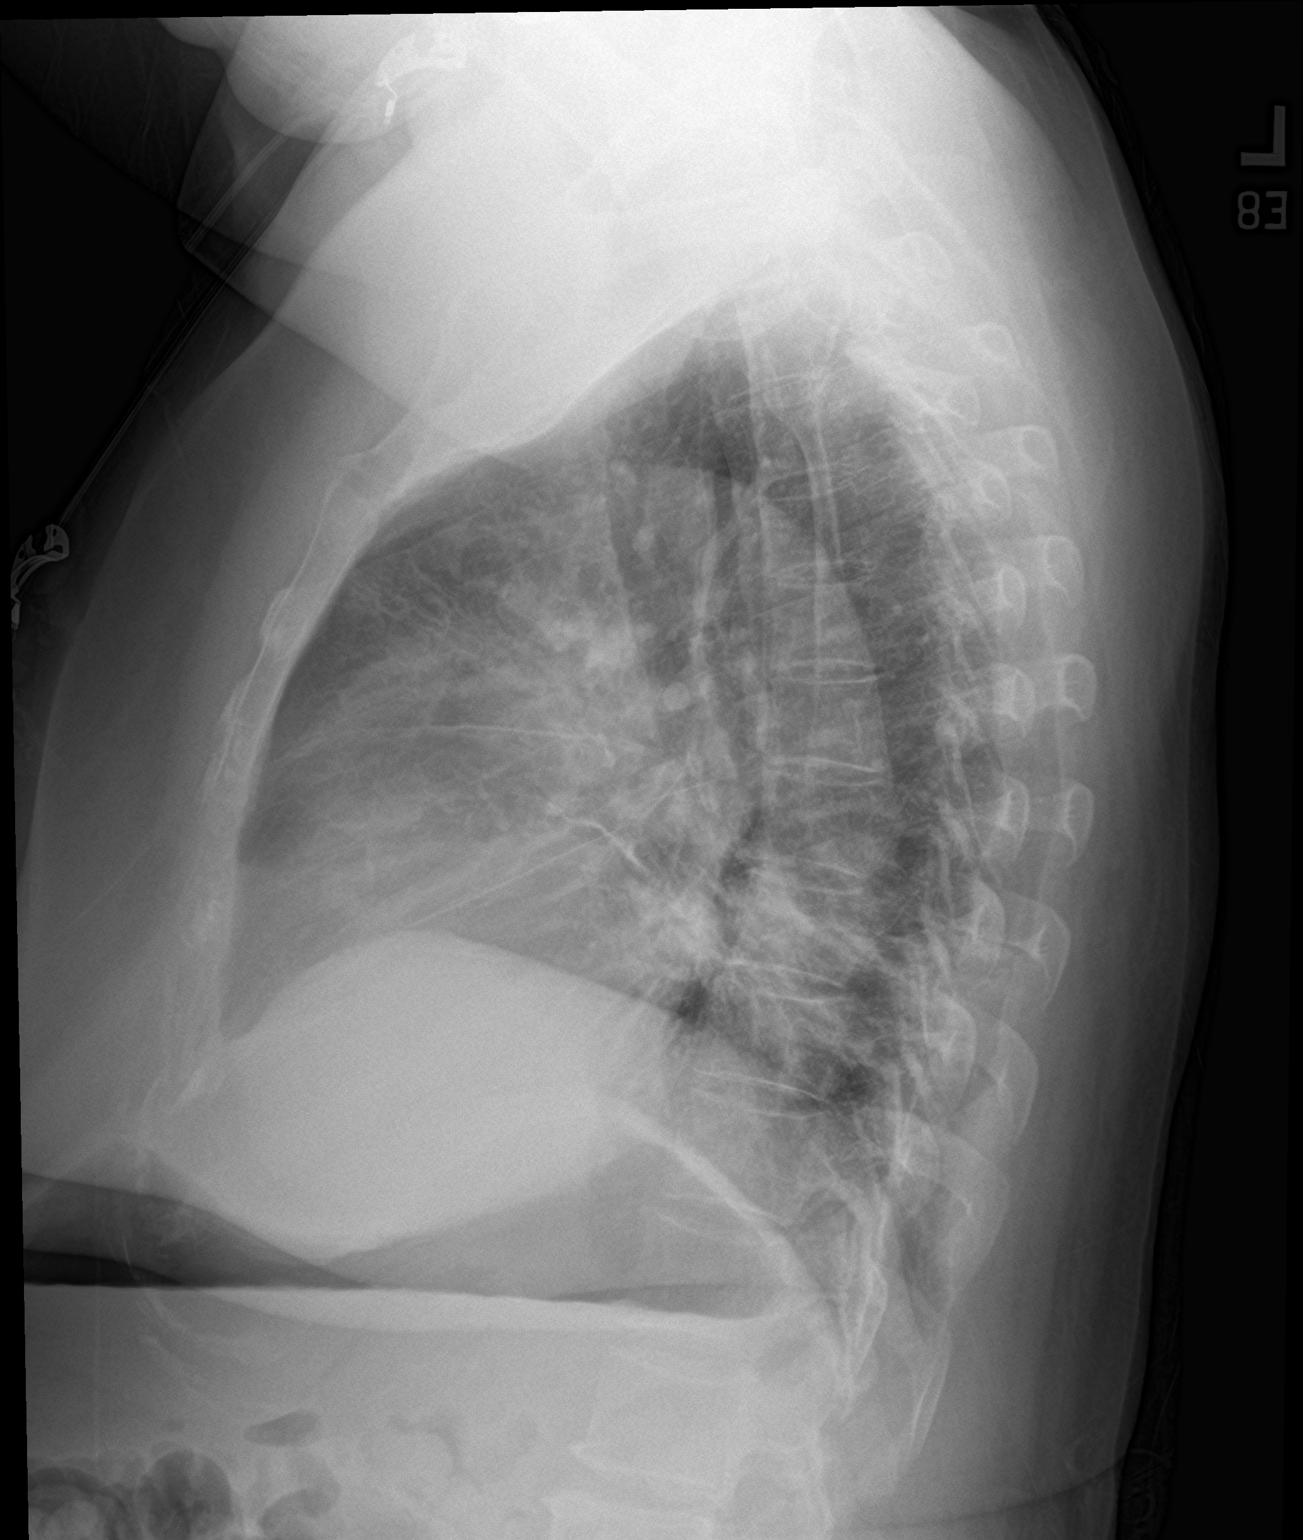

[2 of 2 positions shown; findings below may reference images not displayed]

FINDINGS: Patchy airspace opacity noted in each lung base region. Heart is
borderline enlarged with pulmonary vascularity normal. No
adenopathy. There is aortic atherosclerosis. No bone lesions.
IMPRESSION: Probable pneumonia in each lung base. Lungs otherwise clear.
Borderline cardiac enlargement. Aortic Atherosclerosis
([id number]).

## 2022-06-12 ENCOUNTER — Telehealth (HOSPITAL_COMMUNITY): Payer: Self-pay

## 2022-06-12 NOTE — Telephone Encounter (Signed)
Patient Advocate Encounter  This patient is currently eligible for a grant that would cover the cost of Carvedilol, [name], Farxiga.  Spoke to patient by phone, she is receiving Entresto through [employer], and is not interested in pursuing grant coverage for the other medications at this time.  Clista Bernhardt, CPhT Rx Patient Advocate Phone: [phone]

## 2022-06-14 ENCOUNTER — Encounter: Payer: Self-pay | Admitting: Dietician

## 2022-06-14 ENCOUNTER — Telehealth: Payer: Self-pay | Admitting: *Deleted

## 2022-06-14 ENCOUNTER — Encounter: Payer: Medicare Other | Attending: Internal Medicine | Admitting: Dietician

## 2022-06-14 ENCOUNTER — Other Ambulatory Visit: Payer: Self-pay | Admitting: *Deleted

## 2022-06-14 DIAGNOSIS — N1831 Chronic kidney disease, stage 3a: Secondary | ICD-10-CM | POA: Insufficient documentation

## 2022-06-14 DIAGNOSIS — Z713 Dietary counseling and surveillance: Secondary | ICD-10-CM | POA: Insufficient documentation

## 2022-06-14 DIAGNOSIS — E1122 Type 2 diabetes mellitus with diabetic chronic kidney disease: Secondary | ICD-10-CM | POA: Insufficient documentation

## 2022-06-14 DIAGNOSIS — E119 Type 2 diabetes mellitus without complications: Secondary | ICD-10-CM

## 2022-06-14 NOTE — Progress Notes (Signed)
Diabetes Self-Management Education  Visit Type: First/Initial  Appt. Start Time: 1535 Appt. End Time: 1640  06/14/2022  [name], identified by name and date of birth, is a 77 y.o. female with a diagnosis of Diabetes: Type 2.   ASSESSMENT  Primary concern: Pt states she is concerned about bringing her a1c down.   History includes: CHF, COPD, HTN, kidney disease, thyroid disease, type 2 diabetes Labs noted: 04/2022 A1c 6.8% Medications include: farxiga Supplements: cinnamon, melatonin at night, vitamin D3, vitamin B12, sea moss with elderberry  Pt states she checked her blood glucose at home one time and it was 100mg /dL but she does not remember how to use her meter so she brought it today.   Taught blood glucose meter and checked blood glucose and it was 162mg /dL. Pt states in the car before this appointment she had orange juice and peanut butter crackers.  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 Pt states she feels like this stresses her out some.  Pt enjoys dancing for stress relief. Pt states she walks with her son 2x/wk for 20 minutes.  Pt states she doesn't eat after 9pm.   There were no vitals taken for this visit. There is no height or weight on file to calculate BMI.   Diabetes Self-Management Education - 06/14/22 1534       Visit Information   Visit Type First/Initial      Initial Visit   Diabetes Type Type 2    Date Diagnosed 04/2022    Are you currently following a meal plan? No    Are you taking your medications as prescribed? Yes      Health Coping   How would you rate your overall health? Good      Psychosocial Assessment   Patient Belief/Attitude about Diabetes Motivated to manage diabetes    What is the hardest part about your diabetes right now, causing you the most concern, or is the most worrisome to you about your diabetes?   Making healty food and beverage choices    Self-care barriers None     Self-management support Doctor's office    Other persons present Patient    Patient Concerns Nutrition/Meal planning    Special Needs None    Preferred Learning Style No preference indicated    Learning Readiness Ready    How often do you need to have someone help you when you read instructions, pamphlets, or other written materials from your doctor or pharmacy? 1 - Never    What is the last grade level you completed in school? 12th      Pre-Education Assessment   Patient understands the diabetes disease and treatment process. Needs Instruction    Patient understands incorporating nutritional management into lifestyle. Needs Instruction    Patient undertands incorporating physical activity into lifestyle. Needs Instruction    Patient understands using medications safely. Needs Instruction    Patient understands monitoring blood glucose, interpreting and using results Needs Instruction    Patient understands prevention, detection, and treatment of acute complications. Needs Instruction    Patient understands prevention, detection, and treatment of chronic complications. Needs Instruction    Patient understands how to develop strategies to address psychosocial issues. Needs Instruction    Patient understands how to develop strategies to promote health/change behavior. Needs Instruction      Complications   Last HgB A1C per patient/outside source 6.8 %    How often do you check your  blood sugar? 1-2 times/day    Postprandial Blood glucose range (mg/dL) 130-179    Have you had a dilated eye exam in the past 12 months? Yes    Have you had a dental exam in the past 12 months? Yes    Are you checking your feet? Yes    How many days per week are you checking your feet? 4      Dietary Intake   Breakfast omelet with mushroom and onions OR smoothie with fruit, yogurt, fat free milk, and water OR whole wheat toast with cheese and grits and low sodium bacon    Snack (morning) peanut butter crackers  (packaged or made at home)    Lunch skips OR half a sandwich (ham, chicken, etc.)    Snack (afternoon) none    Dinner 7pm: grilled chicken with rice, peas and carrots OR white beans and kale and cornbread    Snack (evening) small piece of chocolate cake OR none    Beverage(s) 32 oz water, coffee with cream and splenda, unsweetened tea, diet green tea, orange juice a couple times a week      Activity / Exercise   Activity / Exercise Type Light (walking / raking leaves)    How many days per week do you exercise? 2    How many minutes per day do you exercise? 20    Total minutes per week of exercise 40      Patient Education   Previous Diabetes Education No    Disease Pathophysiology Definition of diabetes, type 1 and 2, and the diagnosis of diabetes;Factors that contribute to the development of diabetes;Explored patient's options for treatment of their diabetes    Healthy Eating Role of diet in the treatment of diabetes and the relationship between the three main macronutrients and blood glucose level;Food label reading, portion sizes and measuring food.;Plate Method;Reviewed blood glucose goals for pre and post meals and how to evaluate the patients' food intake on their blood glucose level.;Meal timing in regards to the patients' current diabetes medication.;Information on hints to eating out and maintain blood glucose control.;Meal options for control of blood glucose level and chronic complications.    Being Active Helped patient identify appropriate exercises in relation to his/her diabetes, diabetes complications and other health issue.;Identified with patient nutritional and/or medication changes necessary with exercise.    Medications Reviewed patients medication for diabetes, action, purpose, timing of dose and side effects.;Reviewed medication adjustment guidelines for hyperglycemia and sick days.    Monitoring Identified appropriate SMBG and/or A1C goals.;Daily foot exams;Yearly dilated  eye exam;Taught/discussed recording of test results and interpretation of SMBG.    Acute complications Discussed and identified patients' prevention, symptoms, and treatment of hyperglycemia.;Taught prevention, symptoms, and  treatment of hypoglycemia - the 15 rule.    Chronic complications Relationship between chronic complications and blood glucose control;Reviewed with patient heart disease, higher risk of, and prevention;Identified and discussed with patient  current chronic complications;Lipid levels, blood glucose control and heart disease    Diabetes Stress and Support Identified and addressed patients feelings and concerns about diabetes;Worked with patient to identify barriers to care and solutions;Role of stress on diabetes    Lifestyle and Health Coping Lifestyle issues that need to be addressed for better diabetes care      Individualized Goals (developed by patient)   Nutrition General guidelines for healthy choices and portions discussed    Physical Activity Exercise 3-5 times per week;15 minutes per day    Medications take my  medication as prescribed    Monitoring  Test my blood glucose as discussed    Problem Solving Eating Pattern    Reducing Risk examine blood glucose patterns;do foot checks daily;treat hypoglycemia with 15 grams of carbs if blood glucose less than 70mg /dL    Health Coping Ask for help with psychological, social, or emotional issues      Post-Education Assessment   Patient understands the diabetes disease and treatment process. Comprehends key points    Patient understands incorporating nutritional management into lifestyle. Comprehends key points    Patient undertands incorporating physical activity into lifestyle. Comprehends key points    Patient understands using medications safely. Comphrehends key points    Patient understands monitoring blood glucose, interpreting and using results Comprehends key points    Patient understands prevention, detection, and  treatment of acute complications. Comprehends key points    Patient understands prevention, detection, and treatment of chronic complications. Comprehends key points    Patient understands how to develop strategies to address psychosocial issues. Comprehends key points    Patient understands how to develop strategies to promote health/change behavior. Comprehends key points      Outcomes   Expected Outcomes Demonstrated interest in learning. Expect positive outcomes    Future DMSE 3-4 months    Program Status Not Completed             Individualized Plan for Diabetes Self-Management Training:   Learning Objective:  Patient will have a greater understanding of diabetes self-management. Patient education plan is to attend individual and/or group sessions per assessed needs and concerns.   Plan:   Patient Instructions  Goal: Continue walking 2x/wk for 20 minutes, add 10-15 minutes dancing 3 days per week.   Goal: Start getting in bed at 1am.   Goal: Make 1/2 of your plate vegetables at least 1x/day.   List of common non-starchy vegetables (1/2 plate): Broccoli Asparagus Squash Zucchini  Tomato Onion Cucumber Carrots Beets Brussels Sprouts  Cauliflower  Celery Greens ([place], Mustard, etc.) [place], [place], Spinach, etc. Green Beans Mushrooms Peppers Eggplant Cabbage  At snacks aim to include a carb and protein. Examples: cheese and crackers, peanut butter and crackers, apple and peanut butter, fruit and nuts, fruit and cheese.    Expected Outcomes:  Demonstrated interest in learning. Expect positive outcomes  Education material provided: ADA - How to Thrive: A Guide for Your Journey with Diabetes, Non-starchy vegetable list.  If problems or questions, patient to contact team via:  Phone  Future DSME appointment: 3-4 months

## 2022-06-14 NOTE — Telephone Encounter (Signed)
Medication Samples have been provided to the patient.  Drug name: Wilder Glade       Strength: 10mg         Qty: 4 boxes  LOT: [id number]  Exp.Date: 2026  Dosing instructions: Take 1 tablet by mouth daily  The patient has been instructed regarding the correct time, dose, and frequency of taking this medication, including desired effects and most common side effects.   Adolm Joseph Q 2:27 PM 06/14/2022   Meds in sample closet and ready for pick up

## 2022-06-14 NOTE — Patient Instructions (Addendum)
Goal: Continue walking 2x/wk for 20 minutes, add 10-15 minutes dancing 3 days per week.   Goal: Start getting in bed at 1am.   Goal: Make 1/2 of your plate vegetables at least 1x/day.   List of common non-starchy vegetables (1/2 plate): Broccoli Asparagus Squash Zucchini  Tomato Onion Cucumber Carrots Beets Brussels Sprouts  Cauliflower  Celery Greens ([place], Mustard, etc.) [place], [place], Spinach, etc. Green Beans Mushrooms Peppers Eggplant Cabbage  At snacks aim to include a carb and protein. Examples: cheese and crackers, peanut butter and crackers, apple and peanut butter, fruit and nuts, fruit and cheese.

## 2022-06-18 ENCOUNTER — Ambulatory Visit: Payer: 59

## 2022-06-26 ENCOUNTER — Other Ambulatory Visit: Payer: Self-pay | Admitting: Nurse Practitioner

## 2022-06-26 DIAGNOSIS — J309 Allergic rhinitis, unspecified: Secondary | ICD-10-CM

## 2022-07-13 ENCOUNTER — Ambulatory Visit: Payer: Medicare Other | Attending: Family | Admitting: Family

## 2022-07-13 ENCOUNTER — Telehealth (HOSPITAL_COMMUNITY): Payer: Self-pay

## 2022-07-13 ENCOUNTER — Other Ambulatory Visit (HOSPITAL_COMMUNITY): Payer: Self-pay

## 2022-07-13 ENCOUNTER — Encounter: Payer: Self-pay | Admitting: Family

## 2022-07-13 VITALS — BP 115/54 | HR 65 | Resp 14 | Wt 136.0 lb

## 2022-07-13 DIAGNOSIS — E785 Hyperlipidemia, unspecified: Secondary | ICD-10-CM | POA: Insufficient documentation

## 2022-07-13 DIAGNOSIS — N189 Chronic kidney disease, unspecified: Secondary | ICD-10-CM | POA: Diagnosis present

## 2022-07-13 DIAGNOSIS — E079 Disorder of thyroid, unspecified: Secondary | ICD-10-CM | POA: Diagnosis not present

## 2022-07-13 DIAGNOSIS — Z79899 Other long term (current) drug therapy: Secondary | ICD-10-CM | POA: Insufficient documentation

## 2022-07-13 DIAGNOSIS — I130 Hypertensive heart and chronic kidney disease with heart failure and stage 1 through stage 4 chronic kidney disease, or unspecified chronic kidney disease: Secondary | ICD-10-CM | POA: Insufficient documentation

## 2022-07-13 DIAGNOSIS — I420 Dilated cardiomyopathy: Secondary | ICD-10-CM | POA: Insufficient documentation

## 2022-07-13 DIAGNOSIS — I509 Heart failure, unspecified: Secondary | ICD-10-CM | POA: Diagnosis not present

## 2022-07-13 DIAGNOSIS — I421 Obstructive hypertrophic cardiomyopathy: Secondary | ICD-10-CM | POA: Diagnosis not present

## 2022-07-13 DIAGNOSIS — I5032 Chronic diastolic (congestive) heart failure: Secondary | ICD-10-CM

## 2022-07-13 DIAGNOSIS — J449 Chronic obstructive pulmonary disease, unspecified: Secondary | ICD-10-CM | POA: Diagnosis not present

## 2022-07-13 DIAGNOSIS — I10 Essential (primary) hypertension: Secondary | ICD-10-CM

## 2022-07-13 DIAGNOSIS — D869 Sarcoidosis, unspecified: Secondary | ICD-10-CM | POA: Diagnosis not present

## 2022-07-13 NOTE — Patient Instructions (Addendum)
Call [employer] patient assistance when you need a refill on the entresto.    Please bring your income information as soon as possible so we can send in the farxiga application.

## 2022-07-13 NOTE — Progress Notes (Signed)
Patient ID: Stacey [name], female    DOB: 1946/01/03, 77 y.o.   MRN: [id number]  Primary cardiologist: Marcina Millard, MD (last seen 04/24; returns 10/24) PCP: Marisue Ivan, MD (last seen 02/24)  HPI  [name] is a 77 y/o female with a history of HTN, CKD, thyroid disease, COPD and chronic heart failure. 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   Echo 05/24/21: EF of 50% along with moderate LVH, moderate MR and mild AS. Echo 07/12/20: EF of 20-25% along with mild MR/AR/ AS.   Has not been admitted or been in the ED in the last 6 months.   She presents today for a HF follow-up visit with a chief complaint of minimal SOB with moderate exertion. Chronic in nature. Has cough, fatigue, head congestion & difficulty sleeping along with this. Denies abdominal distention, palpitations, pedal edema, chest pain, wheezing, dizziness or weight gain.   Went to walk-in clinic last week due to sinus congestion and was treated with zpack and meds for cough. Continues to have some congestion but says that she's feeling better than last week.   She says that she can't afford farxiga and was previously getting this through the company but hasn't gotten it recently from the company and she's unsure why.   Past Medical History:  Diagnosis Date   CHF (congestive heart failure) (HCC)    Chronic kidney disease    COPD (chronic obstructive pulmonary disease) (HCC)    Hypertension    Sarcoidosis    Thyroid disease    Past Surgical History:  Procedure Laterality Date   CATARACT EXTRACTION     COLONOSCOPY WITH PROPOFOL N/A 07/18/2021   Procedure: COLONOSCOPY WITH PROPOFOL;  Surgeon: Wyline Mood, MD;  Location: [facility] ENDOSCOPY;  Service: Gastroenterology;   Laterality: N/A;   EYE SURGERY Right 07/09/2019   GALLBLADDER SURGERY     KNEE SURGERY     thyroidectomy     Family History  Problem Relation Age of Onset   Hypertension Mother    Diabetes Mother    Anuerysm Mother    Diabetes Father    Hypertension Father    Prostate cancer Neg Hx    Bladder Cancer Neg Hx    Kidney cancer Neg Hx    Breast cancer Neg Hx    Social History   Tobacco Use   Smoking status: Never   Smokeless tobacco: Never  Substance Use Topics   Alcohol use: No   Allergies  Allergen Reactions   Codeine Other (See Comments)    Other reaction(s): GI Intolerance Other Reaction: nausea   chest pain Nausea  Other reaction(s): GI Intolerance, Other (See Comments) Nausea GI Intolerance, nausea, chest pain    Propoxyphene Other (See Comments)    Other Reaction: nausea and chest pain   Alendronate     Other reaction(s): Other (See Comments) Chest pain   Ciprofloxacin Rash   Lisinopril     Other reaction(s): Cough   Alendronate Sodium    Amlodipine Swelling   Aspirin     Other reaction(s): Other (See Comments) Other reaction(s): Other (See Comments) Stomach hurt   Methotrexate Hives   Tramadol Nausea And Vomiting   Vicodin [Hydrocodone-Acetaminophen] Nausea And Vomiting   Prior to Admission medications   Medication Sig Start Date End Date Taking? Authorizing Provider  albuterol (VENTOLIN  HFA) 108 (90 Base) MCG/ACT inhaler Inhale into the lungs every 6 (six) hours as needed for wheezing or shortness of breath.   Yes [provider]  benzonatate (TESSALON) 200 MG capsule Take 200 mg by mouth 3 (three) times daily as needed for cough. 07/06/22  Yes [provider]  carvedilol (COREG) 6.25 MG tablet Take 1 tablet (6.25 mg total) by mouth 2 (two) times daily with a meal. 07/15/20  Yes Lurene Shadow, MD  cetirizine (ZYRTEC) 10 MG tablet TAKE 1 TABLET(10 MG) BY MOUTH DAILY 05/18/21  Yes Abernathy, Alyssa, NP  cinacalcet (SENSIPAR) 30 MG tablet  Take 30 mg by mouth every other day.   Yes [provider]  cloNIDine (CATAPRES) 0.1 MG tablet Take 1 tablet (0.1 mg total) by mouth 2 (two) times daily. 03/02/21  Yes Abernathy, Arlyss Repress, NP  dapagliflozin propanediol (FARXIGA) 10 MG TABS tablet Take 1 tablet (10 mg total) by mouth daily before breakfast. 07/18/21  Yes Caylan Schifano A, FNP  fluticasone (FLONASE) 50 MCG/ACT nasal spray Place 2 sprays into both nostrils daily. 03/23/20  Yes Theotis Burrow, NP  fluticasone (FLOVENT HFA) 110 MCG/ACT inhaler Inhale 1 puff into the lungs 2 (two) times daily. 09/27/20  Yes Lyndon Code, MD  furosemide (LASIX) 20 MG tablet Take by mouth. Take 1 tablet by mouth every other day alternating with 2 tablets every other day.   Yes [provider]  gabapentin (NEURONTIN) 100 MG capsule Take 2 capsules (200 mg total) by mouth at bedtime. 06/07/21  Yes Abernathy, Alyssa, NP  hydrALAZINE (APRESOLINE) 100 MG tablet TAKE 1 TABLET(100 MG) BY MOUTH THREE TIMES DAILY 04/25/21  Yes [provider]  latanoprost (XALATAN) 0.005 % ophthalmic solution Place 1 drop into both eyes at bedtime.  01/16/14  Yes [provider]  levothyroxine (SYNTHROID, LEVOTHROID) 88 MCG tablet Take 75 mcg by mouth daily before breakfast. 03/11/17  Yes [provider]  MELATONIN CR PO Take by mouth.   Yes [provider]  montelukast (SINGULAIR) 10 MG tablet TAKE 1 TABLET(10 MG) BY MOUTH DAILY 07/15/21  Yes Sallyanne Kuster, NP  Multiple Vitamins-Minerals (MULTIVITAMIN GUMMIES WOMENS) CHEW Chew 2 tablets by mouth daily.   Yes [provider]  naproxen (NAPROSYN) 500 MG tablet Take 500 mg by mouth daily as needed.   Yes [provider]  ondansetron (ZOFRAN ODT) 4 MG disintegrating tablet Take 1 tablet (4 mg total) by mouth every 8 (eight) hours as needed. 06/25/20  Yes Menshew, Charlesetta Ivory, PA-C  pantoprazole (PROTONIX) 40 MG tablet TAKE 1 TABLET BY MOUTH DAILY AS NEEDED 03/22/21  Yes  Abernathy, Alyssa, NP  pravastatin (PRAVACHOL) 20 MG tablet TAKE 1 TABLET(20 MG) BY MOUTH AT BEDTIME 05/15/21  Yes Abernathy, Alyssa, NP  sacubitril-valsartan (ENTRESTO) 24-26 MG Take 1 tablet by mouth 2 (two) times daily. 06/07/22  Yes Inigo Lantigua, Inetta Fermo A, FNP  timolol (TIMOPTIC) 0.25 % ophthalmic solution Place 1 drop into both eyes 2 (two) times daily.  07/30/09  Yes [provider]  triamcinolone ointment (KENALOG) 0.1 % Apply 1 application topically 2 (two) times daily. Patient not taking: Reported on 07/13/2022 04/20/21   Sallyanne Kuster, NP   Review of Systems  Constitutional:  Positive for fatigue (minimal). Negative for appetite change and fever.  HENT:  Positive for congestion and rhinorrhea. Negative for postnasal drip and sore throat.   Eyes: Negative.   Respiratory:  Positive for cough (dry) and shortness of breath. Negative for chest tightness and wheezing.  Cardiovascular:  Negative for chest pain, palpitations and leg swelling.  Gastrointestinal:  Negative for abdominal distention and abdominal pain.  Endocrine: Negative.   Genitourinary: Negative.   Musculoskeletal:  Positive for back pain.  Skin: Negative.   Allergic/Immunologic: Negative.   Neurological:  Negative for dizziness and light-headedness.  Hematological:  Negative for adenopathy. Does not bruise/bleed easily.  Psychiatric/Behavioral:  Positive for sleep disturbance (sleeping on 2 pillows). Negative for dysphoric mood. The patient is not nervous/anxious.    Vitals:   07/13/22 1104  BP: (!) 115/54  Pulse: 65  Resp: 14  SpO2: 98%  Weight: 136 lb (61.7 kg)   Wt Readings from Last 3 Encounters:  07/13/22 136 lb (61.7 kg)  06/07/22 139 lb 8 oz (63.3 kg)  01/23/22 141 lb (64 kg)   Lab Results  Component Value Date   CREATININE 1.62 (H) 01/11/2021   CREATININE 1.31 (H) 12/09/2020   CREATININE 1.32 (H) 09/28/2020   Physical Exam Vitals and nursing note reviewed.  Constitutional:      Appearance:  Normal appearance.  HENT:     Head: Normocephalic and atraumatic.     Right Ear: Decreased hearing noted.     Left Ear: Decreased hearing noted.  Cardiovascular:     Rate and Rhythm: Normal rate and regular rhythm.  Pulmonary:     Effort: Pulmonary effort is normal. No respiratory distress.     Breath sounds: No wheezing or rales.  Abdominal:     General: There is no distension.     Palpations: Abdomen is soft.     Tenderness: There is no abdominal tenderness.  Musculoskeletal:        General: No tenderness.     Cervical back: Normal range of motion and neck supple.     Right lower leg: No edema.     Left lower leg: No edema.  Skin:    General: Skin is warm and dry.  Neurological:     General: No focal deficit present.     Mental Status: She is alert and oriented to person, place, and time.  Psychiatric:        Mood and Affect: Mood normal.        Behavior: Behavior normal.        Thought Content: Thought content normal.    Assessment & Plan:  1: NICM with preserved ejection fraction- - NYHA class II - euvolemic today - weighing daily; reminded to call for an overnight weight gain of > 2 pounds or a weekly weight gain of > 5 pounds - weight down 3 pounds from last visit here 1 month ago - echo 05/24/21: EF of 50% along with moderate LVH, moderate MR and mild AS. Echo 07/12/20: EF of 20-25% along with mild MR/AR/ AS.  - not adding salt and has been reading food labels for sodium content - saw cardiology Madaline Guthrie) 04/24 - continue farxiga  daily; samples provided by RN today and patient assistance paperwork provided to fill out; reminded that she needed to bring income information - continue entresto 24/26mg  BID - continue carvedilol 6.25mg  BID - continue lasix  daily - BNP 07/18/20 was 68.3  2: HTN with CKD- - BP 115/54 - continue hydralazine  TID - continue clonidine 0.1mg  BID - saw PCP (Linthavong) 02/24 - BMP 04/20/22 reviewed and showed sodium 141,  potassium 4.3, creatinine 1.5 and GFR 36  3: 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

## 2022-07-13 NOTE — Telephone Encounter (Signed)
Advanced Heart Failure Patient Advocate Encounter  The patient was approved for a [employer] grant that will help cover the cost of Carvedilol, Sherryll Burger, Farxiga.  Total amount awarded, $10,000.  Effective: 06/13/2022 - 06/12/2023.  BIN [id number] PCN [id number] Group [id number] ID [id number]  Provided [facility] with processing information, refills are being sent over. Left voicemail with patient regarding approval.  Burnell Blanks, CPhT Rx Patient Advocate Phone: [phone]

## 2022-07-14 ENCOUNTER — Other Ambulatory Visit: Payer: Self-pay

## 2022-07-14 ENCOUNTER — Other Ambulatory Visit: Payer: Self-pay | Admitting: *Deleted

## 2022-07-14 MED ORDER — FARXIGA 10 MG PO TABS: 10.0000 mg | ORAL_TABLET | Freq: Every day | ORAL | 3 refills | Status: DC

## 2022-07-14 MED ORDER — DAPAGLIFLOZIN PROPANEDIOL 10 MG PO TABS: 10.0000 mg | ORAL_TABLET | Freq: Every day | ORAL | 3 refills | Status: DC

## 2022-07-17 ENCOUNTER — Other Ambulatory Visit: Payer: Self-pay | Admitting: Family Medicine

## 2022-07-17 DIAGNOSIS — Z1231 Encounter for screening mammogram for malignant neoplasm of breast: Secondary | ICD-10-CM

## 2022-08-03 ENCOUNTER — Ambulatory Visit
Admission: RE | Admit: 2022-08-03 | Discharge: 2022-08-03 | Disposition: A | Discharge status: Home / Self Care | Payer: Medicare Other | Source: Ambulatory Visit | Attending: Family Medicine | Admitting: Family Medicine

## 2022-08-03 DIAGNOSIS — Z1231 Encounter for screening mammogram for malignant neoplasm of breast: Secondary | ICD-10-CM

## 2022-08-17 ENCOUNTER — Other Ambulatory Visit: Payer: Self-pay | Admitting: Nurse Practitioner

## 2022-08-17 DIAGNOSIS — J309 Allergic rhinitis, unspecified: Secondary | ICD-10-CM

## 2022-08-24 ENCOUNTER — Encounter: Payer: Self-pay | Admitting: Family

## 2022-08-24 ENCOUNTER — Ambulatory Visit: Payer: Medicare Other | Attending: Family | Admitting: Family

## 2022-08-24 ENCOUNTER — Encounter: Payer: Self-pay | Admitting: Pharmacist

## 2022-08-24 VITALS — BP 111/50 | HR 60 | Wt 133.4 lb

## 2022-08-24 DIAGNOSIS — Z79899 Other long term (current) drug therapy: Secondary | ICD-10-CM | POA: Insufficient documentation

## 2022-08-24 DIAGNOSIS — M542 Cervicalgia: Secondary | ICD-10-CM | POA: Insufficient documentation

## 2022-08-24 DIAGNOSIS — E079 Disorder of thyroid, unspecified: Secondary | ICD-10-CM | POA: Diagnosis not present

## 2022-08-24 DIAGNOSIS — I130 Hypertensive heart and chronic kidney disease with heart failure and stage 1 through stage 4 chronic kidney disease, or unspecified chronic kidney disease: Secondary | ICD-10-CM | POA: Insufficient documentation

## 2022-08-24 DIAGNOSIS — E785 Hyperlipidemia, unspecified: Secondary | ICD-10-CM | POA: Insufficient documentation

## 2022-08-24 DIAGNOSIS — N189 Chronic kidney disease, unspecified: Secondary | ICD-10-CM | POA: Insufficient documentation

## 2022-08-24 DIAGNOSIS — I420 Dilated cardiomyopathy: Secondary | ICD-10-CM | POA: Diagnosis not present

## 2022-08-24 DIAGNOSIS — I509 Heart failure, unspecified: Secondary | ICD-10-CM | POA: Diagnosis not present

## 2022-08-24 DIAGNOSIS — Z8249 Family history of ischemic heart disease and other diseases of the circulatory system: Secondary | ICD-10-CM | POA: Diagnosis not present

## 2022-08-24 DIAGNOSIS — I5032 Chronic diastolic (congestive) heart failure: Secondary | ICD-10-CM | POA: Diagnosis not present

## 2022-08-24 DIAGNOSIS — J449 Chronic obstructive pulmonary disease, unspecified: Secondary | ICD-10-CM | POA: Insufficient documentation

## 2022-08-24 DIAGNOSIS — I421 Obstructive hypertrophic cardiomyopathy: Secondary | ICD-10-CM | POA: Diagnosis not present

## 2022-08-24 DIAGNOSIS — I10 Essential (primary) hypertension: Secondary | ICD-10-CM

## 2022-08-24 DIAGNOSIS — D869 Sarcoidosis, unspecified: Secondary | ICD-10-CM | POA: Diagnosis not present

## 2022-08-24 NOTE — Progress Notes (Signed)
PCP: Marisue Ivan, MD (last seen 05/24) Primary Cardiologist: Marcina Millard, MD (last seen 04/24; returns 10/24)  HPI:  [name] is a 77 y/o female with a history of HTN, CKD, thyroid disease, COPD and chronic heart failure. 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   Echo 05/24/21: EF of 50% along with moderate LVH, moderate MR and mild AS. Echo 07/12/20: EF of 20-25% along with mild MR/AR/ AS.   Has not been admitted or been in the ED in the last 6 months.   She presents today for a HF follow-up visit with a chief complaint of minimal fatigue with moderate exertion. Chronic in nature. Has associated left sided neck pain & the sensation of being off balance at times. Says that she woke up this morning with a stiff neck and feels like she may have slept on it wrong but also admits that it could be stress as she's planning a cruise and has to collect money from the other couples. Denies SOB, chest pain, cough, palpitations, edema or dizziness.   Goes to bed at 3am as she stays awake watching tv and playing games on her phone. 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   ROS: All systems negative except as listed in HPI, PMH and Problem List.  SH:  Social History   Socioeconomic History   Marital status: Married    Spouse name: Not on file   Number of children: Not on file   Years of education: Not on file   Highest education level: Not on file  Occupational History   Not on file  Tobacco Use   Smoking status: Never   Smokeless tobacco: Never  Vaping Use   Vaping Use: Never used  Substance and Sexual Activity   Alcohol use: No   Drug use: Not Currently   Sexual activity: Not on file  Other Topics Concern   Not  on file  Social History Narrative   Not on file   Social Determinants of Health   Financial Resource Strain: Low Risk  (09/22/2020)   Overall Financial Resource Strain (CARDIA)    Difficulty of Paying Living Expenses: Not very hard  Food Insecurity: Not on file  Transportation Needs: Not on file  Physical Activity: Not on file  Stress: Not on file  Social Connections: Not on file  Intimate Partner Violence: Not on file    FH:  Family History  Problem Relation Age of Onset   Hypertension Mother    Diabetes Mother    Anuerysm Mother    Diabetes Father    Hypertension Father    Prostate cancer Neg Hx    Bladder Cancer Neg Hx    Kidney cancer Neg Hx    Breast cancer Neg Hx     Past Medical History:  Diagnosis Date   CHF (congestive heart failure) (HCC)    Chronic kidney disease    COPD (chronic obstructive pulmonary disease) (HCC)    Hypertension    Sarcoidosis    Thyroid disease     Current Outpatient Medications  Medication Sig Dispense Refill   albuterol (VENTOLIN HFA) 108 (90 Base) MCG/ACT inhaler Inhale into the lungs every 6 (six) hours as needed for wheezing or shortness of breath.     benzonatate (TESSALON) 200 MG capsule  Take 200 mg by mouth 3 (three) times daily as needed for cough.     carvedilol (COREG) 6.25 MG tablet Take 1 tablet (6.25 mg total) by mouth 2 (two) times daily with a meal. 60 tablet 0   cetirizine (ZYRTEC) 10 MG tablet TAKE 1 TABLET(10 MG) BY MOUTH DAILY 90 tablet 4   cinacalcet (SENSIPAR) 30 MG tablet Take 30 mg by mouth every other day.     cloNIDine (CATAPRES) 0.1 MG tablet Take 1 tablet (0.1 mg total) by mouth 2 (two) times daily. 180 tablet 4   FARXIGA 10 MG TABS tablet Take 1 tablet (10 mg total) by mouth daily before breakfast. 90 tablet 3   fluticasone (FLONASE) 50 MCG/ACT nasal spray Place 2 sprays into both nostrils daily. 16 g 6   fluticasone (FLOVENT HFA) 110 MCG/ACT inhaler Inhale 1 puff into the lungs 2 (two) times daily. 1 each 3    furosemide (LASIX) 20 MG tablet Take by mouth. Take 1 tablet by mouth every other day alternating with 2 tablets every other day.     gabapentin (NEURONTIN) 100 MG capsule Take 2 capsules (200 mg total) by mouth at bedtime. 60 capsule 2   hydrALAZINE (APRESOLINE) 100 MG tablet TAKE 1 TABLET(100 MG) BY MOUTH THREE TIMES DAILY     latanoprost (XALATAN) 0.005 % ophthalmic solution Place 1 drop into both eyes at bedtime.      levothyroxine (SYNTHROID, LEVOTHROID) 88 MCG tablet Take 75 mcg by mouth daily before breakfast.  1   MELATONIN CR PO Take by mouth.     montelukast (SINGULAIR) 10 MG tablet TAKE 1 TABLET(10 MG) BY MOUTH DAILY 90 tablet 3   Multiple Vitamins-Minerals (MULTIVITAMIN GUMMIES WOMENS) CHEW Chew 2 tablets by mouth daily.     naproxen (NAPROSYN) 500 MG tablet Take 500 mg by mouth daily as needed.     ondansetron (ZOFRAN ODT) 4 MG disintegrating tablet Take 1 tablet (4 mg total) by mouth every 8 (eight) hours as needed. 15 tablet 0   pantoprazole (PROTONIX) 40 MG tablet TAKE 1 TABLET BY MOUTH DAILY AS NEEDED 90 tablet 2   pravastatin (PRAVACHOL) 20 MG tablet TAKE 1 TABLET(20 MG) BY MOUTH AT BEDTIME 90 tablet 1   sacubitril-valsartan (ENTRESTO) 24-26 MG Take 1 tablet by mouth 2 (two) times daily. 180 tablet 3   timolol (TIMOPTIC) 0.25 % ophthalmic solution Place 1 drop into both eyes 2 (two) times daily.      triamcinolone ointment (KENALOG) 0.1 % Apply 1 application topically 2 (two) times daily. (Patient not taking: Reported on 07/13/2022) 30 g 2   No current facility-administered medications for this visit.   Vitals:   08/24/22 1107  BP: (!) 111/50  Pulse: 60  SpO2: 96%  Weight: 133 lb 6.4 oz (60.5 kg)   Wt Readings from Last 3 Encounters:  08/24/22 133 lb 6.4 oz (60.5 kg)  07/13/22 136 lb (61.7 kg)  06/07/22 139 lb 8 oz (63.3 kg)   Lab Results  Component Value Date   CREATININE 1.62 (H) 01/11/2021   CREATININE 1.31 (H) 12/09/2020   CREATININE 1.32 (H) 09/28/2020    PHYSICAL EXAM:  General:  Well appearing. No resp difficulty HEENT: normal Neck: supple. JVP flat. No lymphadenopathy or thryomegaly appreciated. Cor: PMI normal. Regular rate & rhythm. No rubs, gallops or murmurs. Lungs: clear Abdomen: soft, nontender, nondistended. No hepatosplenomegaly. No bruits or masses. Extremities: no cyanosis, clubbing, rash, edema Neuro: alert & orientedx3, cranial nerves grossly intact. Moves all 4 extremities  w/o difficulty. Affect pleasant.   ECG: not done   ASSESSMENT & PLAN:  1: NICM with preserved ejection fraction- - likely due to HTN - NYHA class II - euvolemic today - weighing daily; reminded to call for an overnight weight gain of > 2 pounds or a weekly weight gain of > 5 pounds - weight down 3 pounds from last visit here 6 weeks ago - Echo 07/12/20: EF of 20-25% along with mild MR/AR/ AS.  - echo 05/24/21: EF of 50% along with moderate LVH, moderate MR and mild AS.  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

## 2022-08-24 NOTE — Patient Instructions (Signed)
Use heat on your neck

## 2022-08-25 ENCOUNTER — Other Ambulatory Visit: Payer: Self-pay | Admitting: Nurse Practitioner

## 2022-09-06 ENCOUNTER — Emergency Department
Admission: EM | Admit: 2022-09-06 | Discharge: 2022-09-06 | Disposition: A | Discharge status: Home / Self Care | Payer: Medicare Other | Attending: Emergency Medicine | Admitting: Emergency Medicine

## 2022-09-06 ENCOUNTER — Emergency Department: Payer: Medicare Other

## 2022-09-06 ENCOUNTER — Other Ambulatory Visit: Payer: Self-pay

## 2022-09-06 DIAGNOSIS — I6789 Other cerebrovascular disease: Secondary | ICD-10-CM | POA: Diagnosis not present

## 2022-09-06 DIAGNOSIS — M79602 Pain in left arm: Secondary | ICD-10-CM | POA: Insufficient documentation

## 2022-09-06 DIAGNOSIS — Y92410 Unspecified street and highway as the place of occurrence of the external cause: Secondary | ICD-10-CM | POA: Insufficient documentation

## 2022-09-06 DIAGNOSIS — M79605 Pain in left leg: Secondary | ICD-10-CM | POA: Insufficient documentation

## 2022-09-06 MED ORDER — ACETAMINOPHEN 325 MG PO TABS: 650.0000 mg | ORAL_TABLET | Freq: Four times a day (QID) | ORAL | 1 refills | Status: AC | PRN

## 2022-09-06 NOTE — Progress Notes (Signed)
Patient ID: Stacey [name], female   DOB: 05-Apr-1945, 77 y.o.   MRN: [id number]  [facility] FAILURE CLIN[facility] - PHARMACIST COUNSELING NOTE  Guideline-Directed Medical Therapy/Evidence Based Medicine  ACE/ARB/ARNI: Sacubitril-valsartan 24-26 mg twice daily Beta Blocker: Carvedilol 6.25 mg twice daily Aldosterone Antagonist:  none Diuretic: Furosemide 20 mg daily plus 20mg  PRN SGLT2i: Dapagliflozin 10 mg daily  Adherence Assessment  Do you ever forget to take your medication? [] Yes [x] No  Do you ever skip doses due to side effects? [] Yes [x] No  Do you have trouble affording your medicines? [] Yes [x] No  Are you ever unable to pick up your medication due to transportation difficulties? [] Yes [x] No  Do you ever stop taking your medications because you don't believe they are helping? [] Yes [x] No  Do you check your weight daily? [x] Yes [] No   Barriers to obtaining medications: Stacey [name] and Stacey [name] cost very high - [employer] Stacey [name] approved  Vital signs: HR 60, BP 111/50, weight (pounds) 113 lbs Echo 3/23: EF of 50% along with moderate LVH, moderate MR and mild AS. Echo 07/12/20: EF of 20-25% along with mild MR/AR/ AS.       Latest Ref Rng & Units 01/11/2021   11:55 AM 12/09/2020   11:39 AM 09/28/2020   12:40 PM  BMP  Glucose 70 - 99 mg/dL 409  811  914   BUN 8 - 23 mg/dL 27  24  17    Creatinine 0.44 - 1.00 mg/dL 7.82  9.56  2.13   Sodium 135 - 145 mmol/L 141  138  140   Potassium 3.5 - 5.1 mmol/L 3.7  4.3  3.7   Chloride 98 - 111 mmol/L 106  103  106   CO2 22 - 32 mmol/L 26  27  27    Calcium 8.9 - 10.3 mg/dL 08.6  57.8  46.9     Past Medical History:  Diagnosis Date   CHF (congestive heart failure) (HCC)    Chronic kidney disease    COPD (chronic obstructive pulmonary disease) (HCC)    Hypertension    Sarcoidosis    Thyroid disease     ASSESSMENT 77 year old female who presents to the HF clinic for follow up. PMH includes HTN, HLD, CKD,  thyroid disease, COPD, and sarcoidosis with hypertrophic obstructive cardiomyopathy with preserve EF. Most recent nephrology follow up was on 07/24/2022.   [facility] completed during office visit. Dietary sodium restriction discussed again during visit. [employer] grant applied for and approved to cover [place] and Entresto cost.  PLAN  No additional GMDT needed BMET in 2-3 weeks Follow up as indicated by provider   Time spent: 15 minutes  Pheng Prokop Rodriguez-Guzman PharmD, BCPS 11/13/2022 11:34 AM     Current Outpatient Medications:    albuterol (VENTOLIN HFA) 108 (90 Base) MCG/ACT inhaler, Inhale into the lungs every 6 (six) hours as needed for wheezing or shortness of breath., Disp: , Rfl:    benzonatate (TESSALON) 200 MG capsule, Take 200 mg by mouth 3 (three) times daily as needed for cough. (Patient not taking: Reported on 08/24/2022), Disp: , Rfl:    carvedilol (COREG) 6.25 MG tablet, Take 1 tablet (6.25 mg total) by mouth 2 (two) times daily with a meal., Disp: 60 tablet, Rfl: 0   cetirizine (ZYRTEC) 10 MG tablet, TAKE 1 TABLET(10 MG) BY MOUTH DAILY, Disp: 90 tablet, Rfl: 4   cinacalcet (SENSIPAR) 30 MG tablet, Take 30 mg by mouth every other day., Disp: , Rfl:  cloNIDine (CATAPRES) 0.1 MG tablet, Take 1 tablet (0.1 mg total) by mouth 2 (two) times daily., Disp: 180 tablet, Rfl: 4   FARXIGA 10 MG TABS tablet, Take 1 tablet (10 mg total) by mouth daily before breakfast., Disp: 90 tablet, Rfl: 3   fluticasone (FLONASE) 50 MCG/ACT nasal spray, Place 2 sprays into both nostrils daily., Disp: 16 g, Rfl: 6   fluticasone (FLOVENT HFA) 110 MCG/ACT inhaler, Inhale 1 puff into the lungs 2 (two) times daily., Disp: 1 each, Rfl: 3   furosemide (LASIX) 20 MG tablet, Take by mouth. Take 1 tablet by mouth every other day alternating with 2 tablets every other day., Disp: , Rfl:    gabapentin (NEURONTIN) 100 MG capsule, Take 2 capsules (200 mg total) by mouth at bedtime., Disp: 60 capsule, Rfl: 2    hydrALAZINE (APRESOLINE) 50 MG tablet, Take 100 mg by mouth 3 (three) times daily., Disp: , Rfl:    latanoprost (XALATAN) 0.005 % ophthalmic solution, Place 1 drop into both eyes at bedtime. , Disp: , Rfl:    levothyroxine (SYNTHROID) 75 MCG tablet, Take 75 mcg by mouth daily before breakfast., Disp: , Rfl: 1   Melatonin-Pyridoxine 5-10 MG TBCR, Take 10 mg by mouth at bedtime., Disp: , Rfl:    montelukast (SINGULAIR) 10 MG tablet, TAKE 1 TABLET(10 MG) BY MOUTH DAILY, Disp: 90 tablet, Rfl: 3   Multiple Vitamins-Minerals (MULTIVITAMIN GUMMIES WOMENS) CHEW, Chew 2 tablets by mouth daily., Disp: , Rfl:    naproxen (NAPROSYN) 500 MG tablet, Take 500 mg by mouth daily as needed., Disp: , Rfl:    ondansetron (ZOFRAN ODT) 4 MG disintegrating tablet, Take 1 tablet (4 mg total) by mouth every 8 (eight) hours as needed., Disp: 15 tablet, Rfl: 0   pantoprazole (PROTONIX) 40 MG tablet, TAKE 1 TABLET BY MOUTH DAILY AS NEEDED, Disp: 90 tablet, Rfl: 2   pravastatin (PRAVACHOL) 20 MG tablet, TAKE 1 TABLET(20 MG) BY MOUTH AT BEDTIME, Disp: 90 tablet, Rfl: 1   sacubitril-valsartan (ENTRESTO) 24-26 MG, Take 1 tablet by mouth 2 (two) times daily., Disp: 180 tablet, Rfl: 3   timolol (TIMOPTIC) 0.25 % ophthalmic solution, Place 1 drop into both eyes 2 (two) times daily. , Disp: , Rfl:    triamcinolone ointment (KENALOG) 0.1 %, Apply 1 application topically 2 (two) times daily., Disp: 30 g, Rfl: 2     MEDICATION ADHERENCES TIPS AND STRATEGIES Taking medication as prescribed improves patient outcomes in heart failure (reduces hospitalizations, improves symptoms, increases survival) Side effects of medications can be managed by decreasing doses, switching agents, stopping drugs, or adding additional therapy. Please let someone in the Heart Failure Clinic know if you have having bothersome side effects so we can modify your regimen. Do not alter your medication regimen without talking to [place].  Medication reminders can help  patients remember to take drugs on time. If you are missing or forgetting doses you can try linking behaviors, using pill boxes, or an electronic reminder like an alarm on your phone or an app. Some people can also get automated phone calls as medication reminders.
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
## 2022-09-20 ENCOUNTER — Encounter: Payer: Self-pay | Admitting: Dietician

## 2022-09-20 ENCOUNTER — Encounter: Payer: Medicare Other | Attending: Internal Medicine | Admitting: Dietician

## 2022-09-20 VITALS — Ht <= 58 in | Wt 135.6 lb

## 2022-09-20 DIAGNOSIS — E1122 Type 2 diabetes mellitus with diabetic chronic kidney disease: Secondary | ICD-10-CM | POA: Diagnosis present

## 2022-09-20 DIAGNOSIS — E119 Type 2 diabetes mellitus without complications: Secondary | ICD-10-CM

## 2022-09-20 DIAGNOSIS — N183 Chronic kidney disease, stage 3 unspecified: Secondary | ICD-10-CM | POA: Insufficient documentation

## 2022-09-20 DIAGNOSIS — Z713 Dietary counseling and surveillance: Secondary | ICD-10-CM | POA: Diagnosis not present

## 2022-09-20 NOTE — Patient Instructions (Signed)
Continue to eat meals and snacks regularly during the day, every 3-5 hours.  Keep portions of starchy foods to the size of a fisted hand or less.  Keep portions of meats to size of the palm of the hand or less.  Continue to choose mostly low sodium foods.

## 2022-09-20 NOTE — Progress Notes (Signed)
Diabetes Self-Management Education  Visit Type:  Follow-up  Appt. Start Time: 1120 Appt. End Time: 1230  09/20/2022  [name], identified by name and date of birth, is a 77 y.o. female with a diagnosis of Diabetes: Type 2     ASSESSMENT  Height 4\' 9"  (1.448 m), weight 135 lb 9.6 oz (61.5 kg). Body mass index is 29.34 kg/m.    Diabetes Self-Management Education - 09/20/22 1145       Complications   Last HgB A1C per patient/outside source 6.6 %   07/19/22   How often do you check your blood sugar? 1-2 times/day    Fasting Blood glucose range (mg/dL) 09-811    Have you had a dilated eye exam in the past 12 months? Yes    Have you had a dental exam in the past 12 months? Yes    Are you checking your feet? Yes    How many days per week are you checking your feet? 4      Dietary Intake   Breakfast grits, bacon, egg(s), coffee; often eats late am    Lunch time vareies, sometimes skips due to time of day -- usually 1/2 sandwich    Snack (afternoon) peanut butter crackers    Dinner salad with grilled chicken; beans, cabbage, tomato, cornbread; [employer] 1/2 portion spaghetti + salad    Snack (evening) occasionally popcorn; low sodium potato chips; tries to avoid    Beverage(s) water 32oz, coffee with splenda, creamer; unsweetened tea, diet green tea; occasionally orange or apple juice      Activity / Exercise   Activity / Exercise Type Light (walking / raking leaves)    How many days per week do you exercise? 3    How many minutes per day do you exercise? 30    Total minutes per week of exercise 90      Patient Education   Healthy Eating Role of diet in the treatment of diabetes and the relationship between the three main macronutrients and blood glucose level;Food label reading, portion sizes and measuring food.;Plate Method;Carbohydrate counting;Meal timing in regards to the patients' current diabetes medication.;Meal options for control of blood glucose level and chronic  complications.   limiting sodium intake and avoiding excess protein intake   Being Active Role of exercise on diabetes management, blood pressure control and cardiac health.    Monitoring Purpose and frequency of SMBG.;Taught/discussed recording of test results and interpretation of SMBG.    Chronic complications Relationship between chronic complications and blood glucose control   CKD and importance of BG and BP control     Post-Education Assessment   Patient understands the diabetes disease and treatment process. Comprehends key points    Patient understands incorporating nutritional management into lifestyle. Demonstrates understanding / competency    Patient undertands incorporating physical activity into lifestyle. Demonstrates understanding / competency    Patient understands using medications safely. Demonstrates understanding / competency    Patient understands monitoring blood glucose, interpreting and using results Comprehends key points    Patient understands prevention, detection, and treatment of acute complications. Comprehends key points    Patient understands prevention, detection, and treatment of chronic complications. Comprehends key points    Patient understands how to develop strategies to address psychosocial issues. Comprehends key points    Patient understands how to develop strategies to promote health/change behavior. Demonstrates understanding / competency      Outcomes   Program Status Completed  Learning Objective:  Patient will have a greater understanding of diabetes self-management. Patient education plan is to attend individual and/or group sessions per assessed needs and concerns.  Additional intervention notes: Patient has been making positive lifestyle changes to achieve good BG control and manage renal health. She is motivated to continue. Has support from spouse.    Plan:   Patient Instructions  Continue to eat meals and snacks  regularly during the day, every 3-5 hours.  Keep portions of starchy foods to the size of a fisted hand or less.  Keep portions of meats to size of the palm of the hand or less.  Continue to choose mostly low sodium foods.    Expected Outcomes:  Demonstrated interest in learning. Expect positive outcomes  Education material provided:  Plate planner with food lists Sample menus Snacking handout Visit summary with goals/ instructions  If problems or questions, patient to contact team via:  Phone and patient portal  Future DSME appointment: - prn

## 2022-09-25 ENCOUNTER — Other Ambulatory Visit: Payer: Self-pay

## 2022-09-25 MED ORDER — FUROSEMIDE 20 MG PO TABS: 20.0000 mg | ORAL_TABLET | Freq: Two times a day (BID) | ORAL | 6 refills | Status: DC

## 2022-10-05 ENCOUNTER — Other Ambulatory Visit: Payer: Self-pay

## 2022-10-05 DIAGNOSIS — I5032 Chronic diastolic (congestive) heart failure: Secondary | ICD-10-CM

## 2022-10-05 MED ORDER — SACUBITRIL-VALSARTAN 24-26 MG PO TABS
1.0000 | ORAL_TABLET | Freq: Two times a day (BID) | ORAL | 3 refills | Status: DC
Start: 2022-10-05 — End: 2022-10-06

## 2022-10-05 MED ORDER — FARXIGA 10 MG PO TABS
10.0000 mg | ORAL_TABLET | Freq: Every day | ORAL | 3 refills | Status: DC
Start: 2022-10-05 — End: 2023-05-31

## 2022-10-05 NOTE — Telephone Encounter (Signed)
Patient called clinic to request refill on Entresto and Farxiga. Refill placed to [facility] on [address].

## 2022-10-06 ENCOUNTER — Telehealth: Payer: Self-pay

## 2022-10-06 ENCOUNTER — Other Ambulatory Visit: Payer: Self-pay

## 2022-10-06 DIAGNOSIS — I5032 Chronic diastolic (congestive) heart failure: Secondary | ICD-10-CM

## 2022-10-06 MED ORDER — SACUBITRIL-VALSARTAN 24-26 MG PO TABS
1.0000 | ORAL_TABLET | Freq: Two times a day (BID) | ORAL | 3 refills | Status: DC
Start: 2022-10-06 — End: 2023-10-17

## 2022-10-06 NOTE — Telephone Encounter (Signed)
Medication Samples have been provided to the patient.  Drug name: Sherryll Burger       Strength: 24-26 mg        Qty: 1  LOT: [id number]  Exp.Date: August 2025  Dosing instructions: Take 1 tablet 2 times daily.  The patient has been instructed regarding the correct time, dose, and frequency of taking this medication, including desired effects and most common side effects.   Sofie Rower 4:31 PM 10/06/2022

## 2023-02-28 ENCOUNTER — Encounter: Payer: 59 | Admitting: Family
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
## 2023-03-07 ENCOUNTER — Encounter: Payer: 59 | Admitting: Family

## 2023-03-20 ENCOUNTER — Encounter: Payer: Self-pay | Admitting: Family

## 2023-03-20 ENCOUNTER — Ambulatory Visit: Payer: Medicare Other | Attending: Family | Admitting: Family

## 2023-03-20 VITALS — BP 119/52 | HR 60 | Wt 134.0 lb

## 2023-03-20 DIAGNOSIS — I5030 Unspecified diastolic (congestive) heart failure: Secondary | ICD-10-CM | POA: Diagnosis not present

## 2023-03-20 DIAGNOSIS — D8685 Sarcoid myocarditis: Secondary | ICD-10-CM | POA: Insufficient documentation

## 2023-03-20 DIAGNOSIS — J449 Chronic obstructive pulmonary disease, unspecified: Secondary | ICD-10-CM | POA: Insufficient documentation

## 2023-03-20 DIAGNOSIS — I6523 Occlusion and stenosis of bilateral carotid arteries: Secondary | ICD-10-CM | POA: Insufficient documentation

## 2023-03-20 DIAGNOSIS — I428 Other cardiomyopathies: Secondary | ICD-10-CM | POA: Insufficient documentation

## 2023-03-20 DIAGNOSIS — I130 Hypertensive heart and chronic kidney disease with heart failure and stage 1 through stage 4 chronic kidney disease, or unspecified chronic kidney disease: Secondary | ICD-10-CM | POA: Insufficient documentation

## 2023-03-20 DIAGNOSIS — I5032 Chronic diastolic (congestive) heart failure: Secondary | ICD-10-CM | POA: Diagnosis not present

## 2023-03-20 DIAGNOSIS — I420 Dilated cardiomyopathy: Secondary | ICD-10-CM | POA: Diagnosis not present

## 2023-03-20 DIAGNOSIS — I080 Rheumatic disorders of both mitral and aortic valves: Secondary | ICD-10-CM | POA: Insufficient documentation

## 2023-03-20 DIAGNOSIS — N189 Chronic kidney disease, unspecified: Secondary | ICD-10-CM | POA: Insufficient documentation

## 2023-03-20 DIAGNOSIS — I10 Essential (primary) hypertension: Secondary | ICD-10-CM | POA: Diagnosis not present

## 2023-03-20 DIAGNOSIS — E079 Disorder of thyroid, unspecified: Secondary | ICD-10-CM | POA: Insufficient documentation

## 2023-03-20 DIAGNOSIS — E785 Hyperlipidemia, unspecified: Secondary | ICD-10-CM | POA: Diagnosis not present

## 2023-03-20 DIAGNOSIS — E210 Primary hyperparathyroidism: Secondary | ICD-10-CM | POA: Diagnosis not present

## 2023-03-20 DIAGNOSIS — I421 Obstructive hypertrophic cardiomyopathy: Secondary | ICD-10-CM | POA: Insufficient documentation

## 2023-03-20 DIAGNOSIS — R0602 Shortness of breath: Secondary | ICD-10-CM | POA: Diagnosis present

## 2023-03-20 NOTE — Patient Instructions (Signed)
Call us in the future if you need us for anything 
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
## 2023-03-22 ENCOUNTER — Encounter: Payer: 59 | Admitting: Family

## 2023-05-31 ENCOUNTER — Telehealth: Payer: Self-pay | Admitting: Family

## 2023-05-31 ENCOUNTER — Other Ambulatory Visit: Payer: Self-pay

## 2023-05-31 DIAGNOSIS — I5032 Chronic diastolic (congestive) heart failure: Secondary | ICD-10-CM

## 2023-05-31 MED ORDER — FARXIGA 10 MG PO TABS
10.0000 mg | ORAL_TABLET | Freq: Every day | ORAL | 3 refills | Status: AC
Start: 2023-05-31 — End: ?

## 2023-05-31 NOTE — Progress Notes (Signed)
Refills sent in to pharmacy 

## 2023-07-09 ENCOUNTER — Telehealth: Payer: Self-pay

## 2023-07-09 ENCOUNTER — Other Ambulatory Visit (HOSPITAL_COMMUNITY): Payer: Self-pay

## 2023-07-09 NOTE — Telephone Encounter (Signed)
 Advanced Heart Failure Patient Advocate Encounter  The patient was renewed for a [employer] grant that will help cover the cost of Carvedilol , Entresto , Farxiga .  Total amount awarded, $10,000.  Effective: 06/13/2023 - 06/11/2024.  BIN [id number] PCN [id number] [id number] ID [id number]  Pharmacy provided with approval and processing information. Patient informed via phone.  Kennis Peacock, CPhT Rx Patient Advocate Phone: [phone]

## 2023-07-24 ENCOUNTER — Other Ambulatory Visit: Payer: Self-pay | Admitting: Family

## 2023-08-14 ENCOUNTER — Other Ambulatory Visit: Payer: Self-pay | Admitting: Family Medicine

## 2023-08-14 DIAGNOSIS — Z1231 Encounter for screening mammogram for malignant neoplasm of breast: Secondary | ICD-10-CM

## 2023-08-29 ENCOUNTER — Encounter

## 2023-08-31 ENCOUNTER — Telehealth: Payer: Self-pay | Admitting: Family

## 2023-08-31 NOTE — Telephone Encounter (Signed)
 Called to inform pt that she is no longer our pt and sees [facility] and should request refill from her [facility] provider. Phone line kept ringing with no answer or vm.

## 2023-09-01 ENCOUNTER — Other Ambulatory Visit: Payer: Self-pay | Admitting: Family

## 2023-09-03 ENCOUNTER — Telehealth: Payer: Self-pay | Admitting: Family

## 2023-09-03 NOTE — Telephone Encounter (Signed)
 Spoke to pt. Pt states that she is not seeing anyone else for her heart failure. She has made previous appointment for July. Refilled script for 1 month pending appointment for Shawnee Dellen, FNP.

## 2023-09-14 ENCOUNTER — Encounter: Admitting: Family
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
## 2023-09-19 ENCOUNTER — Encounter: Payer: Self-pay | Admitting: Family

## 2023-09-19 ENCOUNTER — Ambulatory Visit: Attending: Family | Admitting: Family

## 2023-09-19 VITALS — BP 132/64 | HR 58 | Wt 138.2 lb

## 2023-09-19 DIAGNOSIS — R002 Palpitations: Secondary | ICD-10-CM | POA: Diagnosis not present

## 2023-09-19 DIAGNOSIS — E785 Hyperlipidemia, unspecified: Secondary | ICD-10-CM | POA: Insufficient documentation

## 2023-09-19 DIAGNOSIS — Z7984 Long term (current) use of oral hypoglycemic drugs: Secondary | ICD-10-CM | POA: Insufficient documentation

## 2023-09-19 DIAGNOSIS — R0602 Shortness of breath: Secondary | ICD-10-CM | POA: Diagnosis present

## 2023-09-19 DIAGNOSIS — D869 Sarcoidosis, unspecified: Secondary | ICD-10-CM | POA: Diagnosis not present

## 2023-09-19 DIAGNOSIS — I421 Obstructive hypertrophic cardiomyopathy: Secondary | ICD-10-CM | POA: Insufficient documentation

## 2023-09-19 DIAGNOSIS — N189 Chronic kidney disease, unspecified: Secondary | ICD-10-CM | POA: Insufficient documentation

## 2023-09-19 DIAGNOSIS — I6523 Occlusion and stenosis of bilateral carotid arteries: Secondary | ICD-10-CM | POA: Diagnosis not present

## 2023-09-19 DIAGNOSIS — I10 Essential (primary) hypertension: Secondary | ICD-10-CM | POA: Diagnosis not present

## 2023-09-19 DIAGNOSIS — M25472 Effusion, left ankle: Secondary | ICD-10-CM | POA: Insufficient documentation

## 2023-09-19 DIAGNOSIS — I5022 Chronic systolic (congestive) heart failure: Secondary | ICD-10-CM | POA: Diagnosis not present

## 2023-09-19 DIAGNOSIS — J449 Chronic obstructive pulmonary disease, unspecified: Secondary | ICD-10-CM | POA: Insufficient documentation

## 2023-09-19 DIAGNOSIS — I130 Hypertensive heart and chronic kidney disease with heart failure and stage 1 through stage 4 chronic kidney disease, or unspecified chronic kidney disease: Secondary | ICD-10-CM | POA: Diagnosis not present

## 2023-09-19 DIAGNOSIS — I340 Nonrheumatic mitral (valve) insufficiency: Secondary | ICD-10-CM | POA: Diagnosis not present

## 2023-09-19 DIAGNOSIS — E079 Disorder of thyroid, unspecified: Secondary | ICD-10-CM | POA: Insufficient documentation

## 2023-09-19 DIAGNOSIS — Z79899 Other long term (current) drug therapy: Secondary | ICD-10-CM | POA: Diagnosis not present

## 2023-09-19 DIAGNOSIS — E210 Primary hyperparathyroidism: Secondary | ICD-10-CM | POA: Insufficient documentation

## 2023-09-19 MED ORDER — SPIRONOLACTONE 25 MG PO TABS: 12.5000 mg | ORAL_TABLET | Freq: Every day | ORAL | 3 refills | Status: DC

## 2023-09-19 NOTE — Addendum Note (Signed)
 Addended by: SHARL GRATE A on: 09/19/2023 12:20 PM   Modules accepted: Orders

## 2023-09-20 ENCOUNTER — Ambulatory Visit: Payer: Self-pay | Admitting: Family

## 2023-09-20 ENCOUNTER — Other Ambulatory Visit
Admission: RE | Admit: 2023-09-20 | Discharge: 2023-09-20 | Disposition: A | Discharge status: Home / Self Care | Attending: Family | Admitting: Family

## 2023-09-20 DIAGNOSIS — I5022 Chronic systolic (congestive) heart failure: Secondary | ICD-10-CM

## 2023-09-20 LAB — BASIC METABOLIC PANEL WITH GFR
Anion gap: 9 (ref 5–15)
BUN: 31 mg/dL — ABNORMAL HIGH (ref 8–23)
CO2: 25 mmol/L (ref 22–32)
Calcium: 10.5 mg/dL — ABNORMAL HIGH (ref 8.9–10.3)
Chloride: 105 mmol/L (ref 98–111)
Creatinine, Ser: 1.77 mg/dL — ABNORMAL HIGH (ref 0.44–1.00)
GFR, Estimated: 30 mL/min — ABNORMAL LOW (ref >60)
Glucose, Bld: 102 mg/dL — ABNORMAL HIGH (ref 70–99)
Potassium: 4.2 mmol/L (ref 3.5–5.1)
Sodium: 139 mmol/L (ref 135–145)

## 2023-09-24 NOTE — Telephone Encounter (Addendum)
 Pt aware, agreeable, and verbalized understanding. She states she can't return for lab work until 7/16. Future BMET order placed previously.   ----- Message from Ellouise DELENA Class sent at 09/20/2023  1:30 PM EDT ----- Potassium level is normal. Kidney function is a little worse. Make sure drinking 60-64 ounces of fluid daily. Decrease lasix  to M, W, F. Recheck BMET next week.  ----- Message ----- From: Rebecka, Lab In [place] Sent: 09/20/2023   1:28 PM EDT To: Ellouise DELENA Class, FNP

## 2023-10-03 ENCOUNTER — Other Ambulatory Visit
Admission: RE | Admit: 2023-10-03 | Discharge: 2023-10-03 | Disposition: A | Discharge status: Home / Self Care | Attending: Family | Admitting: Family

## 2023-10-03 DIAGNOSIS — I5022 Chronic systolic (congestive) heart failure: Secondary | ICD-10-CM | POA: Diagnosis present

## 2023-10-03 LAB — BASIC METABOLIC PANEL WITH GFR
Anion gap: 7 (ref 5–15)
BUN: 32 mg/dL — ABNORMAL HIGH (ref 8–23)
CO2: 26 mmol/L (ref 22–32)
Calcium: 10.6 mg/dL — ABNORMAL HIGH (ref 8.9–10.3)
Chloride: 107 mmol/L (ref 98–111)
Creatinine, Ser: 1.6 mg/dL — ABNORMAL HIGH (ref 0.44–1.00)
GFR, Estimated: 33 mL/min — ABNORMAL LOW (ref >60)
Glucose, Bld: 111 mg/dL — ABNORMAL HIGH (ref 70–99)
Potassium: 4.3 mmol/L (ref 3.5–5.1)
Sodium: 140 mmol/L (ref 135–145)

## 2023-10-06 ENCOUNTER — Ambulatory Visit: Admission: EM | Admit: 2023-10-06 | Discharge: 2023-10-06 | Disposition: A | Discharge status: Home / Self Care

## 2023-10-06 ENCOUNTER — Encounter: Payer: Self-pay | Admitting: Emergency Medicine

## 2023-10-06 DIAGNOSIS — W57XXXA Bitten or stung by nonvenomous insect and other nonvenomous arthropods, initial encounter: Secondary | ICD-10-CM | POA: Diagnosis not present

## 2023-10-06 DIAGNOSIS — S81831A Puncture wound without foreign body, right lower leg, initial encounter: Secondary | ICD-10-CM

## 2023-10-06 DIAGNOSIS — S80861A Insect bite (nonvenomous), right lower leg, initial encounter: Secondary | ICD-10-CM | POA: Diagnosis not present

## 2023-10-06 MED ORDER — PREDNISONE 10 MG (21) PO TBPK: ORAL_TABLET | Freq: Every day | ORAL | 0 refills | Status: DC

## 2023-10-06 MED ORDER — AMOXICILLIN-POT CLAVULANATE 875-125 MG PO TABS: 1.0000 | ORAL_TABLET | Freq: Two times a day (BID) | ORAL | 0 refills | Status: AC

## 2023-10-06 NOTE — ED Provider Notes (Signed)
 Stacey [name]    CSN: [id number] Arrival date & time: 10/06/23  1522      History   Chief Complaint Chief Complaint  Patient presents with   Insect Bite    HPI Stacey [name] is a 78 y.o. female.   Patient presents for evaluation of redness, swelling, pain and warmth to the skin of her right lower leg beginning 1 day ago after insect bite.  Endorses that she felt the insect but unwitnessed.  Has not attempted treatment.  Denies fever or drainage.  Past Medical History:  Diagnosis Date   CHF (congestive heart failure) (HCC)    Chronic kidney disease    COPD (chronic obstructive pulmonary disease) (HCC)    Hypertension    Sarcoidosis    Thyroid  disease     Patient Active Problem List   Diagnosis Date Noted   AKI (acute kidney injury) (HCC) 07/19/2020   UTI (urinary tract infection) 07/18/2020   H/O urinary retention 07/18/2020   Hyponatremia 07/18/2020   Acute on chronic combined systolic and diastolic CHF (congestive heart failure) (HCC) 07/12/2020   Hypertensive emergency 07/12/2020   Elevated troponin 07/12/2020   Hypothyroidism 07/12/2020   Neuralgia and neuritis 03/14/2020   Primary generalized (osteo)arthritis 11/30/2019   Encounter for screening mammogram for malignant neoplasm of breast 11/30/2019   Vasomotor rhinitis 06/28/2019   Atopic dermatitis 06/28/2019   Localized superficial swelling, mass, or lump 06/28/2019   Plantar neuroma of left foot 06/28/2019   Chronic obstructive pulmonary disease (HCC) 11/03/2018   Right flank pain 11/03/2018   Dysuria 11/03/2018   Acute upper respiratory infection 05/06/2018   Acquired hypothyroidism 03/18/2018   CKD (chronic kidney disease), stage IIIa 03/18/2018   ARF (acute renal failure) (HCC) 01/14/2018   Hypertensive crisis 01/14/2018   Acute respiratory failure with hypoxia (HCC) 01/13/2018   Encounter for general adult medical examination with abnormal findings 10/26/2017   Allergic rhinitis  10/26/2017   Vitamin D  deficiency 10/26/2017   Gastroesophageal reflux disease without esophagitis 10/26/2017   Essential hypertension 04/16/2017   Thyroid  disease 04/16/2017   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 09/15/2016   Dilated cardiomyopathy (HCC) 06/27/2016   Bilateral carotid artery stenosis 05/30/2016   SOBOE (shortness of breath on exertion) 05/30/2016   Hyperparathyroidism, primary (HCC) 08/25/2015   Hypercalcemia 08/12/2014   LVH (left ventricular hypertrophy) due to hypertensive disease, without heart failure 07/09/2014   Moderate mitral insufficiency 07/09/2014   Benign essential hypertension 07/06/2014   Osteoporosis, 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      Home Medications    Prior to Admission medications   Medication Sig Start Date End Date Taking? Authorizing Provider  amoxicillin -clavulanate (AUGMENTIN ) 875-125 MG tablet Take 875 mg by mouth 2 (two) times daily. 10/03/23 10/13/23 Yes [provider]  amoxicillin -clavulanate (AUGMENTIN ) 875-125 MG tablet Take 1 tablet by mouth every 12 (twelve) hours for 3 days. 10/06/23 10/09/23 Yes Teresa Shelba SAUNDERS, NP  benzonatate  (  TESSALON ) 200 MG capsule Take 200 mg by mouth 3 (three) times daily as needed for cough. 10/03/23 10/10/23 Yes [provider]  predniSONE  (STERAPRED UNI-PAK 21 TAB) 10 MG (21) TBPK tablet Take by mouth daily. Take 6 tabs by mouth daily  for 1 days, then 5 tabs  for 1 days, then 4 tabs for 1 days, then 3 tabs for 1 days, 2 tabs for 1 days, then 1 tab by mouth daily for 1 days 10/06/23  Yes Emry Tobin, Shelba SAUNDERS, NP  ACCU-CHEK GUIDE test strip daily. as directed    [provider]  albuterol  (VENTOLIN  HFA) 108 (90 Base) MCG/ACT inhaler Inhale into the lungs every 6 (six) hours as needed for wheezing or shortness of breath.    [provider]  Blood Glucose Monitoring Suppl (ACCU-CHEK GUIDE ME) w/Device KIT See admin instructions. 04/27/22   [provider]  carvedilol  (COREG ) 6.25 MG tablet Take 1 tablet (6.25 mg total) by mouth 2 (two) times daily with a meal. 07/15/20   Jens Durand, MD  cetirizine  (ZYRTEC ) 10 MG tablet TAKE 1 TABLET(10 MG) BY MOUTH DAILY 05/18/21   Liana Fish, NP  cinacalcet  (SENSIPAR ) 30 MG tablet Take 30 mg by mouth every other day.    [provider]  cloNIDine  (CATAPRES ) 0.1 MG tablet Take 1 tablet (0.1 mg total) by mouth 2 (two) times daily. 03/02/21   Liana Fish, NP  FARXIGA  10 MG TABS tablet Take 1 tablet (10 mg total) by mouth daily before breakfast. 05/31/23   Donette Ellouise LABOR, FNP  fluticasone  (FLONASE ) 50 MCG/ACT nasal spray Place 2 sprays into both nostrils daily. 03/23/20   Arloa Waddell RAMAN, NP  fluticasone  (FLOVENT  HFA) 110 MCG/ACT inhaler Inhale 1 puff into the lungs 2 (two) times daily. 09/27/20   Khan, Fozia M, MD  furosemide  (LASIX ) 20 MG tablet TAKE 1 TABLET BY MOUTH EVERY OTHER DAY ALTERNATING WITH 2 TABLETS EVERY OTHER DAY Patient taking differently: Take 20 mg by mouth daily. 09/03/23   Donette Ellouise LABOR, FNP  gabapentin  (NEURONTIN ) 100 MG capsule Take 2 capsules (200 mg total) by mouth at bedtime. 06/07/21   Abernathy, Alyssa, NP  hydrALAZINE  (APRESOLINE ) 50 MG tablet Take 100 mg by mouth 3 (three) times daily. 04/25/21   [provider]  latanoprost  (XALATAN ) 0.005 % ophthalmic solution Place 1 drop into both eyes at bedtime.  01/16/14   [provider]  levothyroxine   (SYNTHROID ) 75 MCG tablet Take 75 mcg by mouth daily before breakfast. 03/11/17   [provider]  Melatonin-Pyridoxine 5-10 MG TBCR Take 10 mg by mouth at bedtime.    [provider]  montelukast  (SINGULAIR ) 10 MG tablet TAKE 1 TABLET(10 MG) BY MOUTH DAILY 07/15/21   Liana Fish, NP  Multiple Vitamins-Minerals (MULTIVITAMIN GUMMIES WOMENS) CHEW Chew 2 tablets by mouth daily.    [provider]  naproxen  (NAPROSYN ) 500 MG tablet Take 500 mg by mouth daily as needed.    [provider]  ondansetron  (ZOFRAN  ODT) 4 MG disintegrating tablet Take 1 tablet (4 mg total) by mouth every 8 (eight) hours as needed. 06/25/20   Menshew, Candida LULLA Kings, PA-C  pantoprazole  (PROTONIX ) 40 MG tablet TAKE 1 TABLET BY MOUTH DAILY AS NEEDED 03/22/21   Abernathy, Alyssa, NP  pravastatin  (PRAVACHOL ) 20 MG tablet TAKE 1 TABLET(20 MG) BY MOUTH AT BEDTIME 05/15/21   Abernathy, Alyssa, NP  sacubitril -valsartan  (ENTRESTO ) 24-26 MG Take 1 tablet by mouth 2 (two) times daily. 10/06/22   Donette Ellouise LABOR,  FNP  spironolactone  (ALDACTONE ) 25 MG tablet Take 0.5 tablets (12.5 mg total) by mouth daily. 09/19/23 12/18/23  Donette Ellouise LABOR, FNP  timolol  (TIMOPTIC ) 0.25 % ophthalmic solution Place 1 drop into both eyes 2 (two) times daily.  07/30/09   [provider]  triamcinolone  ointment (KENALOG ) 0.1 % Apply 1 application topically 2 (two) times daily. 04/20/21   Liana Fish, NP    Family History Family History  Problem Relation Age of Onset   Hypertension Mother    Diabetes Mother    Anuerysm Mother    Diabetes Father    Hypertension Father    Prostate cancer Neg Hx    Bladder Cancer Neg Hx    Kidney cancer Neg Hx    Breast cancer Neg Hx     Social History Social History   Tobacco Use   Smoking status: Never   Smokeless tobacco: Never  Vaping Use   Vaping status: Never Used  Substance Use Topics   Alcohol use: No   Drug use: Never     Allergies   Codeine,  Propoxyphene, Alendronate, Ciprofloxacin, Lisinopril, Alendronate sodium, Amlodipine, Aspirin , Methotrexate, Tramadol, and Vicodin [hydrocodone -acetaminophen ]   Review of Systems Review of Systems   Physical Exam Triage Vital Signs ED Triage Vitals  Encounter Vitals Group     BP 10/06/23 1527 120/73     Girls Systolic BP Percentile --      Girls Diastolic BP Percentile --      Boys Systolic BP Percentile --      Boys Diastolic BP Percentile --      Pulse Rate 10/06/23 1527 60     Resp 10/06/23 1527 18     Temp 10/06/23 1527 98.2 F (36.8 C)     Temp src --      SpO2 10/06/23 1527 98 %     Weight --      Height --      Head Circumference --      Peak Flow --      Pain Score 10/06/23 1530 8     Pain Loc --      Pain Education --      Exclude from Growth Chart --    No data found.  Updated Vital Signs BP 120/73 (BP Location: Right Arm)   Pulse 60   Temp 98.2 F (36.8 C)   Resp 18   SpO2 98%   Visual Acuity Right Eye Distance:   Left Eye Distance:   Bilateral Distance:    Right Eye Near:   Left Eye Near:    Bilateral Near:     Physical Exam Constitutional:      Appearance: Normal appearance.  Eyes:     Extraocular Movements: Extraocular movements intact.  Pulmonary:     Effort: Pulmonary effort is normal.  Skin:    Comments: Skin 0.5 cm puncture wound present to the medial aspect of the right knee, moderate to severe swelling with surrounding erythema, skin hot to touch, no drainage noted  Neurological:     Mental Status: She is alert and oriented to person, place, and time. Mental status is at baseline.      UC Treatments / Results  Labs (all labs ordered are listed, but only abnormal results are displayed) Labs Reviewed - No data to display  EKG   Radiology No results found.  Procedures Procedures (including critical care time)  Medications Ordered in UC Medications - No data to display  Initial Impression / Assessment and  Plan / UC  Course  I have reviewed the triage vital signs and the nursing notes.  Pertinent labs & imaging results that were available during my care of the patient were reviewed by me and considered in my medical decision making (see chart for details).  Puncture wound of the right leg, insect bite of the right leg  Concerning for infection but most likely localized reaction currently taking Augmentin  for treatment of a sinusitis and ear infection, extended course from 7 days to 10, additionally prescribed prednisone  and discussed administration recommended supportive care through ice, elevation and monitoring, advised to follow-up for any persisting symptoms Final Clinical Impressions(s) / UC Diagnoses   Final diagnoses:  Insect bite of right leg, initial encounter  Puncture wound of right lower leg, initial encounter     Discharge Instructions      Today you were evaluated for your insect bite, most likely localized reaction but concerning for infection due to to the amount of heat to the area  Continue the antibiotic that you are currently taking for your sinus infection as it would provide bacterial coverage for your insect bite, you were prescribed 7 days of medicine, have extended course to 10 days of medication and additional pill sent to the pharmacy  Starting tomorrow take prednisone  every morning with food to reduce inflammation and help with discomfort  May take Tylenol  additionally as needed for pain  For itching May continue your daily Zyrtec  May take Benadryl as needed for additional comfort May also try topical Benadryl cream or calamine lotion  May apply ice or cool compresses over the affected area 10 to 15-minute intervals  Please be mindful that heat can cause irritation to the skin  Please follow-up with urgent care or your primary doctor if your symptoms continue to persist or do not improve   ED Prescriptions     Medication Sig Dispense Auth. Provider    amoxicillin -clavulanate (AUGMENTIN ) 875-125 MG tablet Take 1 tablet by mouth every 12 (twelve) hours for 3 days. 6 tablet Jader Desai R, NP   predniSONE  (STERAPRED UNI-PAK 21 TAB) 10 MG (21) TBPK tablet Take by mouth daily. Take 6 tabs by mouth daily  for 1 days, then 5 tabs for 1 days, then 4 tabs for 1 days, then 3 tabs for 1 days, 2 tabs for 1 days, then 1 tab by mouth daily for 1 days 21 tablet Keyonda Bickle, Shelba SAUNDERS, NP      PDMP not reviewed this encounter.   Teresa Shelba SAUNDERS, NP 10/06/23 1549

## 2023-10-06 NOTE — ED Triage Notes (Signed)
 Patient reports insect bite to right inner leg x1 day. Patient now complains of pain, redness, swelling and itching to site. Rates pain 8/10. Patient reports she has not taking anything for symptoms.

## 2023-10-06 NOTE — Discharge Instructions (Addendum)
 Today you were evaluated for your insect bite, most likely localized reaction but concerning for infection due to to the amount of heat to the area  Continue the antibiotic that you are currently taking for your sinus infection as it would provide bacterial coverage for your insect bite, you were prescribed 7 days of medicine, have extended course to 10 days of medication and additional pill sent to the pharmacy  Starting tomorrow take prednisone  every morning with food to reduce inflammation and help with discomfort  May take Tylenol  additionally as needed for pain  For itching May continue your daily Zyrtec  May take Benadryl as needed for additional comfort May also try topical Benadryl cream or calamine lotion  May apply ice or cool compresses over the affected area 10 to 15-minute intervals  Please be mindful that heat can cause irritation to the skin  Please follow-up with urgent care or your primary doctor if your symptoms continue to persist or do not improve

## 2023-10-17 ENCOUNTER — Other Ambulatory Visit: Payer: Self-pay

## 2023-10-17 DIAGNOSIS — I5032 Chronic diastolic (congestive) heart failure: Secondary | ICD-10-CM

## 2023-10-17 MED ORDER — SACUBITRIL-VALSARTAN 24-26 MG PO TABS: 1.0000 | ORAL_TABLET | Freq: Two times a day (BID) | ORAL | 3 refills | Status: DC

## 2023-10-23 ENCOUNTER — Telehealth: Payer: Self-pay | Admitting: Family
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
## 2023-10-23 NOTE — Telephone Encounter (Signed)
 Called to confirm/remind patient of their appointment at the Advanced [facility] Failure Clinic on 10/24/23.   Appointment:   [x] Confirmed  [] Left mess   [] No answer/No voice mail  [] VM Full/unable to leave message  [] Phone not in service  Patient reminded to bring all medications and/or complete list.  Confirmed patient has transportation. Gave directions, instructed to utilize valet parking.

## 2023-10-24 ENCOUNTER — Encounter: Payer: Self-pay | Admitting: Family

## 2023-10-24 ENCOUNTER — Ambulatory Visit: Admitting: Family

## 2023-10-24 ENCOUNTER — Ambulatory Visit: Payer: Self-pay | Admitting: Family

## 2023-10-24 ENCOUNTER — Other Ambulatory Visit
Admission: RE | Admit: 2023-10-24 | Discharge: 2023-10-24 | Disposition: A | Discharge status: Home / Self Care | Source: Ambulatory Visit | Attending: Family | Admitting: Family

## 2023-10-24 VITALS — BP 135/56 | HR 56 | Wt 135.0 lb

## 2023-10-24 DIAGNOSIS — E210 Primary hyperparathyroidism: Secondary | ICD-10-CM | POA: Insufficient documentation

## 2023-10-24 DIAGNOSIS — I10 Essential (primary) hypertension: Secondary | ICD-10-CM

## 2023-10-24 DIAGNOSIS — I5022 Chronic systolic (congestive) heart failure: Secondary | ICD-10-CM | POA: Insufficient documentation

## 2023-10-24 DIAGNOSIS — I421 Obstructive hypertrophic cardiomyopathy: Secondary | ICD-10-CM | POA: Insufficient documentation

## 2023-10-24 DIAGNOSIS — R42 Dizziness and giddiness: Secondary | ICD-10-CM | POA: Insufficient documentation

## 2023-10-24 DIAGNOSIS — Z8249 Family history of ischemic heart disease and other diseases of the circulatory system: Secondary | ICD-10-CM | POA: Insufficient documentation

## 2023-10-24 DIAGNOSIS — I130 Hypertensive heart and chronic kidney disease with heart failure and stage 1 through stage 4 chronic kidney disease, or unspecified chronic kidney disease: Secondary | ICD-10-CM | POA: Insufficient documentation

## 2023-10-24 DIAGNOSIS — Z79899 Other long term (current) drug therapy: Secondary | ICD-10-CM | POA: Diagnosis not present

## 2023-10-24 DIAGNOSIS — I420 Dilated cardiomyopathy: Secondary | ICD-10-CM | POA: Insufficient documentation

## 2023-10-24 DIAGNOSIS — I350 Nonrheumatic aortic (valve) stenosis: Secondary | ICD-10-CM | POA: Insufficient documentation

## 2023-10-24 DIAGNOSIS — D869 Sarcoidosis, unspecified: Secondary | ICD-10-CM | POA: Insufficient documentation

## 2023-10-24 DIAGNOSIS — N189 Chronic kidney disease, unspecified: Secondary | ICD-10-CM | POA: Insufficient documentation

## 2023-10-24 DIAGNOSIS — W19XXXA Unspecified fall, initial encounter: Secondary | ICD-10-CM | POA: Diagnosis not present

## 2023-10-24 DIAGNOSIS — E785 Hyperlipidemia, unspecified: Secondary | ICD-10-CM | POA: Insufficient documentation

## 2023-10-24 DIAGNOSIS — J449 Chronic obstructive pulmonary disease, unspecified: Secondary | ICD-10-CM | POA: Diagnosis not present

## 2023-10-24 LAB — BASIC METABOLIC PANEL WITH GFR
Anion gap: 6 (ref 5–15)
BUN: 25 mg/dL — ABNORMAL HIGH (ref 8–23)
CO2: 24 mmol/L (ref 22–32)
Calcium: 10.4 mg/dL — ABNORMAL HIGH (ref 8.9–10.3)
Chloride: 107 mmol/L (ref 98–111)
Creatinine, Ser: 1.37 mg/dL — ABNORMAL HIGH (ref 0.44–1.00)
GFR, Estimated: 40 mL/min — ABNORMAL LOW (ref >60)
Glucose, Bld: 163 mg/dL — ABNORMAL HIGH (ref 70–99)
Potassium: 3.8 mmol/L (ref 3.5–5.1)
Sodium: 137 mmol/L (ref 135–145)

## 2023-10-24 NOTE — Patient Instructions (Signed)
 Medication Changes:  No medication changes.   Lab Work:  Go over to the [facility]. Go pass the gift shop and have your blood work completed.  We will only call you if the results are abnormal or if the provider would like to make medication changes.  No news is good news.   Follow-Up in: 3 months with Ellouise Class, FNP.   Thank you for choosing [facility].    At the Advanced Heart Failure Clinic, you and your health needs are our priority. We have a designated team specialized in the treatment of Heart Failure. This Care Team includes your primary Heart Failure Specialized Cardiologist (physician), Advanced Practice Providers (APPs- Physician Assistants and Nurse Practitioners), and Pharmacist who all work together to provide you with the care you need, when you need it.   You may see any of the following providers on your designated Care Team at your next follow up:  Dr. Toribio Fuel Dr. Ezra Shuck Dr. Ria Commander Dr. Morene Brownie Ellouise Class, FNP Jaun Bash, RPH-CPP  Please be sure to bring in all your medications bottles to every appointment.   Need to Contact Us :  If you have any questions or concerns before your next appointment please send us  a message through [place] or call our office at [phone].    TO LEAVE A MESSAGE FOR THE NURSE SELECT OPTION 2, PLEASE LEAVE A MESSAGE INCLUDING: YOUR NAME DATE OF BIRTH CALL BACK NUMBER REASON FOR CALL**this is important as we prioritize the call backs  YOU WILL RECEIVE A CALL BACK THE SAME DAY AS LONG AS YOU CALL BEFORE 4:00 PM

## 2023-10-25 ENCOUNTER — Telehealth: Payer: Self-pay | Admitting: Family

## 2023-10-25 ENCOUNTER — Ambulatory Visit
Admission: RE | Admit: 2023-10-25 | Discharge: 2023-10-25 | Disposition: A | Discharge status: Home / Self Care | Source: Ambulatory Visit | Attending: Family Medicine | Admitting: Family Medicine

## 2023-10-25 ENCOUNTER — Other Ambulatory Visit: Payer: Self-pay | Admitting: Family

## 2023-10-25 ENCOUNTER — Other Ambulatory Visit: Payer: Self-pay | Admitting: Family Medicine

## 2023-10-25 DIAGNOSIS — R42 Dizziness and giddiness: Secondary | ICD-10-CM | POA: Insufficient documentation

## 2023-10-25 DIAGNOSIS — R1110 Vomiting, unspecified: Secondary | ICD-10-CM

## 2023-10-25 DIAGNOSIS — Y92009 Unspecified place in unspecified non-institutional (private) residence as the place of occurrence of the external cause: Secondary | ICD-10-CM | POA: Insufficient documentation

## 2023-10-25 DIAGNOSIS — S0990XA Unspecified injury of head, initial encounter: Secondary | ICD-10-CM

## 2023-10-25 DIAGNOSIS — W19XXXA Unspecified fall, initial encounter: Secondary | ICD-10-CM | POA: Diagnosis not present

## 2023-10-25 DIAGNOSIS — R110 Nausea: Secondary | ICD-10-CM

## 2023-10-25 DIAGNOSIS — R9082 White matter disease, unspecified: Secondary | ICD-10-CM | POA: Insufficient documentation

## 2023-10-25 DIAGNOSIS — R112 Nausea with vomiting, unspecified: Secondary | ICD-10-CM | POA: Insufficient documentation

## 2023-10-25 DIAGNOSIS — G319 Degenerative disease of nervous system, unspecified: Secondary | ICD-10-CM | POA: Diagnosis not present

## 2023-10-26 ENCOUNTER — Other Ambulatory Visit: Payer: Self-pay | Admitting: Family

## 2023-10-26 MED ORDER — FUROSEMIDE 20 MG PO TABS: 20.0000 mg | ORAL_TABLET | Freq: Every day | ORAL | Status: AC | PRN

## 2023-10-26 MED ORDER — SPIRONOLACTONE 25 MG PO TABS: 25.0000 mg | ORAL_TABLET | Freq: Every day | ORAL | 3 refills | Status: AC

## 2023-11-27 ENCOUNTER — Telehealth: Payer: Self-pay | Admitting: Family

## 2023-11-27 NOTE — Telephone Encounter (Signed)
 Called to confirm/remind patient of their appointment at the Advanced [facility] Failure Clinic on 11/28/23.   Appointment:   [x] Confirmed  [] Left mess   [] No answer/No voice mail  [] VM Full/unable to leave message  [] Phone not in service  Patient reminded to bring all medications and/or complete list.  Confirmed patient has transportation. Gave directions, instructed to utilize valet parking.

## 2023-11-28 ENCOUNTER — Telehealth: Payer: Self-pay | Admitting: Family

## 2023-11-28 ENCOUNTER — Encounter: Admitting: Family

## 2023-11-28 NOTE — Telephone Encounter (Signed)
 Called to confirm/remind patient of their appointment at the Advanced [facility] Failure Clinic on 11/29/23.   Appointment:   [x] Confirmed  [] Left mess   [] No answer/No voice mail  [] VM Full/unable to leave message  [] Phone not in service  Patient reminded to bring all medications and/or complete list.  Confirmed patient has transportation. Gave directions, instructed to utilize valet parking.
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
## 2023-11-29 ENCOUNTER — Ambulatory Visit: Attending: Family | Admitting: Family

## 2023-11-29 ENCOUNTER — Encounter: Payer: Self-pay | Admitting: Family

## 2023-11-29 ENCOUNTER — Encounter: Admitting: Family

## 2023-11-29 VITALS — BP 128/54 | HR 61 | Wt 135.0 lb

## 2023-11-29 DIAGNOSIS — R002 Palpitations: Secondary | ICD-10-CM | POA: Diagnosis not present

## 2023-11-29 DIAGNOSIS — I5022 Chronic systolic (congestive) heart failure: Secondary | ICD-10-CM

## 2023-11-29 DIAGNOSIS — I420 Dilated cardiomyopathy: Secondary | ICD-10-CM | POA: Insufficient documentation

## 2023-11-29 DIAGNOSIS — Z7984 Long term (current) use of oral hypoglycemic drugs: Secondary | ICD-10-CM | POA: Insufficient documentation

## 2023-11-29 DIAGNOSIS — D869 Sarcoidosis, unspecified: Secondary | ICD-10-CM | POA: Diagnosis not present

## 2023-11-29 DIAGNOSIS — E785 Hyperlipidemia, unspecified: Secondary | ICD-10-CM | POA: Diagnosis not present

## 2023-11-29 DIAGNOSIS — N189 Chronic kidney disease, unspecified: Secondary | ICD-10-CM | POA: Insufficient documentation

## 2023-11-29 DIAGNOSIS — Z79899 Other long term (current) drug therapy: Secondary | ICD-10-CM | POA: Diagnosis not present

## 2023-11-29 DIAGNOSIS — R0602 Shortness of breath: Secondary | ICD-10-CM | POA: Diagnosis present

## 2023-11-29 DIAGNOSIS — R42 Dizziness and giddiness: Secondary | ICD-10-CM | POA: Diagnosis not present

## 2023-11-29 DIAGNOSIS — E210 Primary hyperparathyroidism: Secondary | ICD-10-CM | POA: Insufficient documentation

## 2023-11-29 DIAGNOSIS — M549 Dorsalgia, unspecified: Secondary | ICD-10-CM | POA: Insufficient documentation

## 2023-11-29 DIAGNOSIS — I10 Essential (primary) hypertension: Secondary | ICD-10-CM | POA: Diagnosis not present

## 2023-11-29 DIAGNOSIS — I6523 Occlusion and stenosis of bilateral carotid arteries: Secondary | ICD-10-CM | POA: Insufficient documentation

## 2023-11-29 DIAGNOSIS — I421 Obstructive hypertrophic cardiomyopathy: Secondary | ICD-10-CM | POA: Diagnosis not present

## 2023-11-29 DIAGNOSIS — I130 Hypertensive heart and chronic kidney disease with heart failure and stage 1 through stage 4 chronic kidney disease, or unspecified chronic kidney disease: Secondary | ICD-10-CM | POA: Insufficient documentation

## 2023-11-29 DIAGNOSIS — J449 Chronic obstructive pulmonary disease, unspecified: Secondary | ICD-10-CM | POA: Insufficient documentation

## 2023-11-29 NOTE — Patient Instructions (Signed)
 It was good to see you today!  If you receive a satisfaction survey regarding the Heart Failure Clinic, please take the time to fill it out. This way we can continue to provide excellent care and make any changes that need to be made.

## 2023-11-30 ENCOUNTER — Telehealth: Payer: Self-pay | Admitting: Family

## 2023-11-30 ENCOUNTER — Other Ambulatory Visit: Payer: Self-pay

## 2023-11-30 DIAGNOSIS — I5032 Chronic diastolic (congestive) heart failure: Secondary | ICD-10-CM

## 2023-11-30 MED ORDER — SACUBITRIL-VALSARTAN 24-26 MG PO TABS: 1.0000 | ORAL_TABLET | Freq: Two times a day (BID) | ORAL | 3 refills | Status: DC

## 2023-11-30 NOTE — Telephone Encounter (Signed)
 Prescription resent to patient's requested pharmacy.

## 2023-11-30 NOTE — Telephone Encounter (Signed)
 Prescription was marked for print in error. Resent through e-prescribe.

## 2024-01-30 ENCOUNTER — Encounter: Admitting: Family

## 2024-03-05 ENCOUNTER — Other Ambulatory Visit: Payer: Self-pay

## 2024-03-05 ENCOUNTER — Telehealth: Payer: Self-pay

## 2024-03-05 DIAGNOSIS — I5032 Chronic diastolic (congestive) heart failure: Secondary | ICD-10-CM

## 2024-03-05 DIAGNOSIS — E782 Mixed hyperlipidemia: Secondary | ICD-10-CM

## 2024-03-05 MED ORDER — SACUBITRIL-VALSARTAN 24-26 MG PO TABS
1.0000 | ORAL_TABLET | Freq: Two times a day (BID) | ORAL | 3 refills | Status: AC
  Filled 2024-03-05: qty 180, 90d supply, fill #0

## 2024-03-05 NOTE — Telephone Encounter (Signed)
 Pt stopped by the clinic for entresto  samples. She states her pharmacy has been having difficulty getting it and stated they would be at least another week.  Pt also expressed interest in switching pharmacies. Informed the patient about the [facility], and pt asked that we send Entresto  to them.   Medication Samples have been provided to the patient.  Drug name: Entresto        Strength: 24/26 MG        Qty: 2  LOT: [id number]  Exp.Date: 05/26 LOT: [id number] Exp. Date: 04/19/25  Dosing instructions: Take one tablet by mouth two times daily  The patient has been instructed regarding the correct time, dose, and frequency of taking this medication, including desired effects and most common side effects.   Broghan Pannone K Chesnee Floren 10:03 AM 03/05/2024

## 2024-05-28 ENCOUNTER — Encounter: Admitting: Family
